# Patient Record
Sex: Female | Born: 1935
Health system: Southern US, Community
[De-identification: ages and names within clinical notes are randomized; demographics above are authoritative.]

## PROBLEM LIST (undated history)

## (undated) DIAGNOSIS — Z7409 Other reduced mobility: Secondary | ICD-10-CM

## (undated) DIAGNOSIS — R911 Solitary pulmonary nodule: Secondary | ICD-10-CM

## (undated) DIAGNOSIS — Z9289 Personal history of other medical treatment: Secondary | ICD-10-CM

## (undated) DIAGNOSIS — K219 Gastro-esophageal reflux disease without esophagitis: Secondary | ICD-10-CM

## (undated) DIAGNOSIS — E785 Hyperlipidemia, unspecified: Secondary | ICD-10-CM

## (undated) DIAGNOSIS — I499 Cardiac arrhythmia, unspecified: Secondary | ICD-10-CM

## (undated) DIAGNOSIS — G4700 Insomnia, unspecified: Secondary | ICD-10-CM

## (undated) DIAGNOSIS — I10 Essential (primary) hypertension: Secondary | ICD-10-CM

## (undated) DIAGNOSIS — I4891 Unspecified atrial fibrillation: Secondary | ICD-10-CM

## (undated) DIAGNOSIS — T7840XA Allergy, unspecified, initial encounter: Secondary | ICD-10-CM

## (undated) DIAGNOSIS — M810 Age-related osteoporosis without current pathological fracture: Secondary | ICD-10-CM

## (undated) DIAGNOSIS — J181 Lobar pneumonia, unspecified organism: Secondary | ICD-10-CM

## (undated) HISTORY — DX: Other reduced mobility: Z74.09

## (undated) HISTORY — DX: Unspecified atrial fibrillation: I48.91

## (undated) HISTORY — DX: Personal history of other medical treatment: Z92.89

## (undated) HISTORY — DX: Essential (primary) hypertension: I10

## (undated) HISTORY — DX: Age-related osteoporosis without current pathological fracture: M81.0

## (undated) HISTORY — DX: Cardiac arrhythmia, unspecified: I49.9

## (undated) HISTORY — DX: Insomnia, unspecified: G47.00

## (undated) HISTORY — DX: Gastro-esophageal reflux disease without esophagitis: K21.9

## (undated) HISTORY — DX: Solitary pulmonary nodule: R91.1

## (undated) HISTORY — DX: Allergy, unspecified, initial encounter: T78.40XA

## (undated) HISTORY — DX: Hyperlipidemia, unspecified: E78.5

## (undated) HISTORY — PX: CT SCAN: SHX5351

## (undated) HISTORY — DX: Lobar pneumonia, unspecified organism: J18.1

---
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
## 1976-06-30 HISTORY — PX: TUBAL LIGATION: SHX77

## 2012-06-02 ENCOUNTER — Ambulatory Visit (INDEPENDENT_AMBULATORY_CARE_PROVIDER_SITE_OTHER): Payer: BC Managed Care – PPO | Admitting: Family Medicine

## 2012-06-02 ENCOUNTER — Telehealth: Payer: Self-pay | Admitting: Family Medicine

## 2012-06-02 VITALS — BP 160/85 | HR 87 | Temp 98.0°F | Resp 18 | Ht 63.0 in | Wt 140.0 lb

## 2012-06-02 DIAGNOSIS — I4891 Unspecified atrial fibrillation: Secondary | ICD-10-CM

## 2012-06-02 LAB — PROTIME-INR
INR: 2.42 — ABNORMAL HIGH (ref <1.50)
Prothrombin Time: 25.4 seconds — ABNORMAL HIGH (ref 11.6–15.2)

## 2012-06-02 NOTE — Progress Notes (Signed)
  [facility]:  Office Visit  Chief Complaint:  Chief Complaint  Patient presents with  . labwork    PT INR check    HPI: Tammy Walker is a 78 y.o. female who complains of  Here for labwork INR due to atrial fibrillation , cardioverted, dx in 5 years. Recent INR was 2.8. No bleeding. She is visiting daughter from [place].  Past Medical History  Diagnosis Date  . Allergy   . Atrial fibrillation   . GERD (gastroesophageal reflux disease)    Past Surgical History  Procedure Date  . Appendectomy   . Tubal ligation    History   Social History  . Marital Status: Married    Spouse Name: N/A    Number of Children: N/A  . Years of Education: N/A   Social History Main Topics  . Smoking status: Never Smoker   . Smokeless tobacco: None  . Alcohol Use: No  . Drug Use: No  . Sexually Active: No   Other Topics Concern  . None   Social History Narrative  . None   Family History  Problem Relation Age of Onset  . Heart disease Mother   . Heart disease Father   . Heart disease Brother   . Hyperlipidemia Daughter   . Hypertension Daughter    No Known Allergies Prior to Admission medications   Medication Sig Start Date End Date Taking? Authorizing Provider  amiodarone (PACERONE) 200 MG tablet Take 200 mg by mouth daily.   Yes Historical Provider, MD  atorvastatin (LIPITOR) 10 MG tablet Take 10 mg by mouth daily.   Yes Historical Provider, MD  omeprazole (PRILOSEC) 20 MG capsule Take 20 mg by mouth daily.   Yes Historical Provider, MD  warfarin (COUMADIN) 3 MG tablet Take 3.5 mg by mouth daily.   Yes Historical Provider, MD     ROS: The patient denies fevers, chills, night sweats, unintentional weight loss, chest pain, palpitations, wheezing, dyspnea on exertion, nausea, vomiting, abdominal pain, dysuria, hematuria, melena, numbness, weakness, or tingling.   All other systems have been reviewed and were otherwise negative with the exception of those  mentioned in the HPI and as above.    PHYSICAL EXAM: Filed Vitals:   06/02/12 0909  BP: 160/85  Pulse: 87  Temp: 98 F (36.7 C)  Resp: 18   Filed Vitals:   06/02/12 0909  Height: 5\' 3"  (1.6 m)  Weight: 140 lb (63.504 kg)   Body mass index is 24.80 kg/(m^2).  General: Alert, no acute distress HEENT:  Normocephalic, atraumatic, oropharynx patent.  Cardiovascular:  Regular rate and rhythm, no rubs, slight systolic murmur at RUSB,  no gallops.  No Carotid bruits, radial pulse intact. No pedal edema.  Respiratory: Clear to auscultation bilaterally.  No wheezes, rales, or rhonchi.  No cyanosis, no use of accessory musculature GI: No organomegaly, abdomen is soft and non-tender, positive bowel sounds.  No masses. Skin: No rashes. Neurologic: Facial musculature symmetric. Psychiatric: Patient is appropriate throughout our interaction. Lymphatic: No cervical lymphadenopathy Musculoskeletal: Gait intact.   LABS: No results found for this or any previous visit.   EKG/XRAY:   Primary read interpreted by Dr. Conley Rolls at [facility].   ASSESSMENT/PLAN: Encounter Diagnosis  Name Primary?  . Atrial fibrillation, controlled Yes    Check PT/INR.  F/u prn Monitor BP, if greater than 140/90 then consider low dose meds    Osamu Olguin PHUONG, DO 06/02/2012 10:09 AM

## 2012-06-02 NOTE — Telephone Encounter (Signed)
LM on her daughter's machine that her INR was 2.42. She is in acceptable range for me for  A. Fib ( 2-3), whish is the range for nonvalvular Atrial fibrillation. Her doctors in [place] want her to be 2.5-3 but I think 2.42. is fine. If she is worried about it she can get it rechecked in 1 week to see if it is within range of 2.5-3 as specified by her doctors in [place].

## 2012-06-25 ENCOUNTER — Ambulatory Visit (INDEPENDENT_AMBULATORY_CARE_PROVIDER_SITE_OTHER): Payer: BC Managed Care – PPO | Admitting: Family Medicine

## 2012-06-25 VITALS — BP 151/82 | HR 86 | Temp 98.1°F | Resp 16 | Ht 63.0 in | Wt 140.4 lb

## 2012-06-25 DIAGNOSIS — IMO0001 Reserved for inherently not codable concepts without codable children: Secondary | ICD-10-CM

## 2012-06-25 DIAGNOSIS — I4891 Unspecified atrial fibrillation: Secondary | ICD-10-CM

## 2012-06-25 DIAGNOSIS — Z7901 Long term (current) use of anticoagulants: Secondary | ICD-10-CM | POA: Insufficient documentation

## 2012-06-25 DIAGNOSIS — I4819 Other persistent atrial fibrillation: Secondary | ICD-10-CM | POA: Insufficient documentation

## 2012-06-25 DIAGNOSIS — Z79899 Other long term (current) drug therapy: Secondary | ICD-10-CM | POA: Insufficient documentation

## 2012-06-25 LAB — PROTIME-INR
INR: 2.24 — ABNORMAL HIGH (ref <1.50)
Prothrombin Time: 24 seconds — ABNORMAL HIGH (ref 11.6–15.2)

## 2012-06-25 MED ORDER — AMLODIPINE BESYLATE 2.5 MG PO TABS: 2.5000 mg | ORAL_TABLET | Freq: Every day | ORAL | Status: DC

## 2012-06-25 NOTE — Patient Instructions (Addendum)
Start one amlodipine pill a day (2.5mg ).  We may end up going to 5 mg a day depending on how your BP looks.  I will also be in touch with you regarding your INR result when it comes in.

## 2012-06-25 NOTE — Progress Notes (Signed)
[facility] [address], [place] [address] [phone]  Date:  06/25/2012   Name:  Tammy Walker   DOB:  02-May-1931   MRN:  [id number]  PCP:  No primary provider on file.    Chief Complaint: Hypertension   History of Present Illness:  Tammy Walker is a 78 y.o. very pleasant female patient who presents with the following:  She was last here on 06/02/12 for an INR- she has a history of atrial fibrillation and is on coumadin.  Visiting [place] from [place]- her daughter lives in [place].    Her BP was 160/ 85 at that visit.  Her INR was 2.42.  They have been checking her BP at home- her BP has been running 177- 200/ 89- 100. She had been on amlodipine for about 4 years but it was stopped in February when she was ill and her BP got too low. She had been increased from 5 to 10 mg shortly before her BP got too low and she passed out.    She is taking 3.5 mg of coumadin each day.  No unusual bleeding or bruising.    She is not having any symptoms of HTN- no HA or CP- feels well overall.  However, she is quite concerned about her BP being so high and would like to start back on some medication  Patient Active Problem List  Diagnosis  . Atrial fibrillation  . Chronic anticoagulation    Past Medical History  Diagnosis Date  . Allergy   . Atrial fibrillation   . GERD (gastroesophageal reflux disease)     Past Surgical History  Procedure Date  . Appendectomy   . Tubal ligation     History  Substance Use Topics  . Smoking status: Never Smoker   . Smokeless tobacco: Not on file  . Alcohol Use: No    Family History  Problem Relation Age of Onset  . Heart disease Mother   . Heart disease Father   . Heart disease Brother   . Hyperlipidemia Daughter   . Hypertension Daughter     No Known Allergies  Medication list has been reviewed and updated.  Current Outpatient Prescriptions on File Prior to Visit  Medication Sig Dispense Refill  .  amiodarone (PACERONE) 200 MG tablet Take 200 mg by mouth daily.      [name] atorvastatin (LIPITOR) 10 MG tablet Take 10 mg by mouth daily.      [name] omeprazole (PRILOSEC) 20 MG capsule Take 20 mg by mouth daily.      [name] warfarin (COUMADIN) 3 MG tablet Take 3.5 mg by mouth daily.        Review of Systems:  As per HPI- otherwise negative.   Physical Examination: Filed Vitals:   06/25/12 0852  BP: 151/82  Pulse: 86  Temp: 98.1 F (36.7 C)  Resp: 16   Filed Vitals:   06/25/12 0852  Height: 5\' 3"  (1.6 m)  Weight: 140 lb 6.4 oz (63.685 kg)   Body mass index is 24.87 kg/(m^2). Ideal Body Weight: Weight in (lb) to have BMI = 25: 140.8   GEN: WDWN, NAD, Non-toxic, A & O x 3 HEENT: Atraumatic, Normocephalic. Neck supple. No masses, No LAD. Ears and Nose: No external deformity. CV: RRR, No M/G/R. No JVD. No thrill. No extra heart sounds. PULM: CTA B, no wheezes, crackles, rhonchi. No retractions. No resp. distress. No accessory muscle use. EXTR: No c/c/e NEURO Normal gait.  PSYCH: Normally interactive. Conversant. Not depressed or anxious appearing.  Calm demeanor.    Assessment and Plan: 1. Atrial fibrillation    2. Chronic anticoagulation  Protime-INR  3. Elevated BP  amLODipine (NORVASC) 2.5 MG tablet   HTN, stopped treatment when she was ill and became hypotensive several months ago.  However, her BP is once again elevated.  Will start on a low dose of norvasc- 2.5 mg.  May increase to 5 mg if her BP is still high.  Check PT/ INR today and follow-up.  They will call me with a BP update next week   Results for orders placed in visit on 06/25/12  PROTIME-INR      Component Value Range   Prothrombin Time 24.0 (*) 11.6 - 15.2 seconds   INR 2.24 (*) <1.50     Tynell Winchell, MD

## 2012-06-30 ENCOUNTER — Telehealth: Payer: Self-pay

## 2012-06-30 NOTE — Telephone Encounter (Signed)
Pt claims her bp is elevated would like to know if she should double her medication please call [phone]

## 2012-06-30 NOTE — Telephone Encounter (Signed)
HTN, stopped treatment when she was ill and became hypotensive several months ago. However, her BP is once again elevated. Will start on a low dose of norvasc- 2.5 mg. May increase to 5 mg if her BP is still high. Check PT/ INR today and follow-up. They will call me with a BP update next week   200/100 148/86  196/101  Patient advised to go ahead and increase this to Norvasc 5mg . She will do this. FYI

## 2012-07-05 NOTE — Telephone Encounter (Signed)
Patient has doubled Rx but is out at this point and insurance and pharmacy are requesting new rx to be written to include her taking double the rx. [facility] on [address] (daughter) [phone]

## 2012-07-08 ENCOUNTER — Ambulatory Visit (INDEPENDENT_AMBULATORY_CARE_PROVIDER_SITE_OTHER): Payer: BC Managed Care – PPO | Admitting: Family Medicine

## 2012-07-08 ENCOUNTER — Ambulatory Visit: Payer: BC Managed Care – PPO

## 2012-07-08 VITALS — BP 153/81 | HR 69 | Temp 97.8°F | Resp 16 | Ht 64.0 in | Wt 137.0 lb

## 2012-07-08 DIAGNOSIS — M549 Dorsalgia, unspecified: Secondary | ICD-10-CM

## 2012-07-08 DIAGNOSIS — T148XXA Other injury of unspecified body region, initial encounter: Secondary | ICD-10-CM

## 2012-07-08 DIAGNOSIS — I10 Essential (primary) hypertension: Secondary | ICD-10-CM

## 2012-07-08 DIAGNOSIS — IMO0002 Reserved for concepts with insufficient information to code with codable children: Secondary | ICD-10-CM

## 2012-07-08 MED ORDER — AMLODIPINE BESYLATE 5 MG PO TABS: 5.0000 mg | ORAL_TABLET | Freq: Every day | ORAL | Status: DC

## 2012-07-08 MED ORDER — LIDOCAINE 5 % EX PTCH: 1.0000 | MEDICATED_PATCH | CUTANEOUS | Status: DC

## 2012-07-08 MED ORDER — METHOCARBAMOL 500 MG PO TABS: 500.0000 mg | ORAL_TABLET | Freq: Three times a day (TID) | ORAL | Status: DC

## 2012-07-08 MED ORDER — HYDROCODONE-ACETAMINOPHEN 5-500 MG PO TABS: 1.0000 | ORAL_TABLET | Freq: Four times a day (QID) | ORAL | Status: DC | PRN

## 2012-07-08 NOTE — Progress Notes (Signed)
Subjective: Patient injured her back little less than a week ago. She been doing some vacuuming and various work around the house. Did not have a specific injury, but the back started hurting more and more. She went to the airport a week ago to fly out of [place]  and was so miserable she ended up having to go to the emergency room in [place]. They kept her all night and did a number of tests on her. She returned to [facility] and has continued to have extreme pain in her back. She's been going to a chiropractor loss last 3 or 4 days who has helped some with various modalities, but she still has tremendous pain. She's been taking ibuprofen and acetaminophen alternating.  She is from [place], and is returning home hopefully next week. She has a doctor's appointment there. Her blood pressures been high, and she needs a refill of her Norvasc 5 mg. She is trying to type herself through until she can get home. Her daughter is going to fly with her.  Objective: Tender in the mid to low thoracic area, tender along the spine but also in the paraspinous muscles over to below the scapula. She is able to ambulate but it hurts tremendously to laydown.  [facility] reading (PRIMARY) by  Dr. Alwyn Ren X-rays revealed significant osteoporosis. She has a lot of dorsal kyphosis. There is some apparent Nehring of the anterior aspects of the vertebra in the neighborhood of about T7 and T8. I cannot count them exactly accurately.  X-rays from the chiropractor  were reviewed, and I did not feel like I couldn't get a clear impression although she has some arthritic deterioration..  Assessment: Severe back pain Hypertension  Plan: See discharge instructions Lidoderm and hydrocodone Refill blood pressure medications

## 2012-07-08 NOTE — Patient Instructions (Signed)
Use the patches, pain medication, and muscle relaxants as directed.  Continue current blood pressure medication  Return if needed

## 2012-07-11 ENCOUNTER — Other Ambulatory Visit: Payer: Self-pay | Admitting: Family Medicine

## 2012-07-11 ENCOUNTER — Telehealth: Payer: Self-pay

## 2012-07-11 DIAGNOSIS — M549 Dorsalgia, unspecified: Secondary | ICD-10-CM

## 2012-07-11 NOTE — Telephone Encounter (Signed)
PTS DAUGHTER WOULD LIKE CLARIFICATION ON RECENT VISIT NOTES VS. DIAGNOSIS CODE ON AVS. ALSO WAITING ON REFILL ON LIDACAINE RX. STATES AMOUNT OK TO USE (3X DAILY PER DR HOPPER) MADE THEM RUN THROUGH RX FASTER (HAVE 6 LEFT) WOULD LIKE REFILLED BEFORE THEY GO TO [place].  TAKING BACK TO [place] ON Thursday. WOULD LIKE CALL BEFORE THEN PLEASE.   BF

## 2012-07-12 NOTE — Telephone Encounter (Signed)
Patients daughter advised. She states the package states on Lidocaine patch to use up to 3 patches, so she was using 3. I have advised her I will ask about renewing the lidoderm patch, but she should only use one patch at a time currently. Please advise on renewal.

## 2012-07-13 MED ORDER — LIDOCAINE 5 % EX PTCH: MEDICATED_PATCH | CUTANEOUS | Status: DC

## 2012-07-13 MED ORDER — LIDOCAINE 5 % EX PTCH: 1.0000 | MEDICATED_PATCH | CUTANEOUS | Status: DC

## 2012-07-13 NOTE — Telephone Encounter (Signed)
One patch every 12 hours.  I'm not sure of office notes vs. Diagnosis codes-sometimes we have to choose a diagnosis code that is similar but not exact due to limited codes being available.  The diagnosis code vs note diagnosis wouldn't change the office visit charge.

## 2012-07-13 NOTE — Telephone Encounter (Signed)
I spoke w/ Marylene Land to clarify how pt should use the Lidoderm patches and she stated pt can use up to 3 patches at a time for 12 hours and then take off for 12 hrs for a total max of 3 patches in a 24 hr period. Also OKd increase of Rx to #30.  Sent in new Rx and LMOM for daughter to CB.

## 2012-07-13 NOTE — Telephone Encounter (Signed)
Patient's daughter advised.  

## 2012-07-30 ENCOUNTER — Encounter: Payer: Self-pay | Admitting: Family Medicine

## 2014-07-26 ENCOUNTER — Ambulatory Visit (INDEPENDENT_AMBULATORY_CARE_PROVIDER_SITE_OTHER): Payer: BLUE CROSS/BLUE SHIELD | Admitting: Family Medicine

## 2014-07-26 VITALS — BP 135/80 | HR 82 | Temp 98.5°F | Resp 18 | Ht 62.0 in | Wt 134.8 lb

## 2014-07-26 DIAGNOSIS — Z8679 Personal history of other diseases of the circulatory system: Secondary | ICD-10-CM

## 2014-07-26 DIAGNOSIS — I4891 Unspecified atrial fibrillation: Secondary | ICD-10-CM

## 2014-07-26 DIAGNOSIS — Z7901 Long term (current) use of anticoagulants: Secondary | ICD-10-CM

## 2014-07-26 NOTE — Progress Notes (Signed)
Chief Complaint:  Chief Complaint  Patient presents with  . need to check INR    HPI: Tammy Walker is a 81 y.o. female who is here for  INR check on warfarin for a fib in sinus rhythm.  NO Ses, Last iNR in 06/07/14 was 2.1, goal is 2-3 She is visiting her [place] from [place].  [place]  Past Medical History  Diagnosis Date  . Allergy   . Atrial fibrillation   . GERD (gastroesophageal reflux disease)   . Osteoporosis     on fosamax  . Insomnia    Past Surgical History  Procedure Laterality Date  . Appendectomy    . Tubal ligation     History   Social History  . Marital Status: Married    Spouse Name: N/A    Number of Children: N/A  . Years of Education: N/A   Social History Main Topics  . Smoking status: Never Smoker   . Smokeless tobacco: None  . Alcohol Use: No  . Drug Use: No  . Sexual Activity: No   Other Topics Concern  . None   Social History Narrative   Family History  Problem Relation Age of Onset  . Heart disease Mother   . Heart disease Father   . Heart disease Brother   . Hyperlipidemia Daughter   . Hypertension Daughter    No Known Allergies Prior to Admission medications   Medication Sig Start Date End Date Taking? Authorizing Provider  amiodarone (PACERONE) 200 MG tablet Take 200 mg by mouth daily.   Yes Historical Provider, MD  amitriptyline (ELAVIL) 10 MG tablet Take 20 mg by mouth at bedtime.   Yes Historical Provider, MD  amLODipine (NORVASC) 5 MG tablet Take 1 tablet (5 mg total) by mouth daily. 07/08/12  Yes Peyton Najjaravid H Hopper, MD  atorvastatin (LIPITOR) 10 MG tablet Take 10 mg by mouth daily.   Yes Historical Provider, MD  calcium carbonate 1250 MG capsule Take 1,250 mg by mouth 2 (two) times daily with a meal.   Yes Historical Provider, MD  omeprazole (PRILOSEC) 20 MG capsule Take 20 mg by mouth daily.   Yes Historical Provider, MD  warfarin (COUMADIN) 3 MG tablet Take 3.5 mg by mouth daily.   Yes  Historical Provider, MD     ROS: The patient denies fevers, chills, night sweats, unintentional weight loss, chest pain, palpitations, wheezing, dyspnea on exertion, nausea, vomiting, abdominal pain, dysuria, hematuria, melena, numbness, weakness, or tingling.   All other systems have been reviewed and were otherwise negative with the exception of those mentioned in the HPI and as above.    PHYSICAL EXAM: Filed Vitals:   07/26/14 1556  BP: 135/80  Pulse: 82  Temp: 98.5 F (36.9 C)  Resp: 18   Filed Vitals:   07/26/14 1556  Height: 5\' 2"  (1.575 m)  Weight: 134 lb 12.8 oz (61.145 kg)   Body mass index is 24.65 kg/(m^2).  General: Alert, no acute distress HEENT:  Normocephalic, atraumatic, oropharynx patent. EOMI, PERRLA Cardiovascular:  Regular rate and rhythm, no rubs murmurs or gallops.  No pedal edema.  Respiratory: Clear to auscultation bilaterally.  No wheezes, rales, or rhonchi.  No cyanosis, no use of accessory musculature GI: No organomegaly, abdomen is soft and non-tender, positive bowel sounds.  No masses. Skin: No rashes. Neurologic: Facial musculature symmetric. Psychiatric: Patient is appropriate throughout our interaction. Lymphatic: No cervical lymphadenopathy Musculoskeletal: Gait intact.  LABS: Results for orders placed or performed in visit on 06/25/12  Protime-INR  Result Value Ref Range   Prothrombin Time 24.0 (H) 11.6 - 15.2 seconds   INR 2.24 (H) <1.50     EKG/XRAY:   Primary read interpreted by Dr. Conley Rolls at [facility].   ASSESSMENT/PLAN: Encounter Diagnoses  Name Primary?  . Chronic anticoagulation Yes  . Atrial fibrillation, currently in sinus rhythm    PT/INR pending F/u prn   Gross sideeffects, risk and benefits, and alternatives of medications d/w patient. Patient is aware that all medications have potential sideeffects and we are unable to predict every sideeffect or drug-drug interaction that may occur.  Hamilton Capri PHUONG,  DO 07/26/2014 5:28 PM

## 2014-07-27 LAB — PROTIME-INR
INR: 2.07 — ABNORMAL HIGH (ref <1.50)
Prothrombin Time: 23.3 seconds — ABNORMAL HIGH (ref 11.6–15.2)

## 2015-07-26 ENCOUNTER — Ambulatory Visit (INDEPENDENT_AMBULATORY_CARE_PROVIDER_SITE_OTHER): Payer: BLUE CROSS/BLUE SHIELD | Admitting: Urgent Care

## 2015-07-26 VITALS — BP 134/83 | HR 76 | Temp 97.6°F | Resp 16 | Ht 63.0 in | Wt 134.8 lb

## 2015-07-26 DIAGNOSIS — Z8679 Personal history of other diseases of the circulatory system: Secondary | ICD-10-CM

## 2015-07-26 DIAGNOSIS — I4891 Unspecified atrial fibrillation: Secondary | ICD-10-CM

## 2015-07-26 DIAGNOSIS — R030 Elevated blood-pressure reading, without diagnosis of hypertension: Secondary | ICD-10-CM

## 2015-07-26 DIAGNOSIS — Z7901 Long term (current) use of anticoagulants: Secondary | ICD-10-CM | POA: Diagnosis not present

## 2015-07-26 DIAGNOSIS — I10 Essential (primary) hypertension: Secondary | ICD-10-CM

## 2015-07-26 NOTE — Patient Instructions (Signed)
Warfarin Coagulopathy °Warfarin (Coumadin®) coagulopathy refers to bleeding that may occur as a complication of the medicine warfarin. Warfarin is an oral blood thinner (anticoagulant). Warfarin is used for medical conditions where thinning of the blood is needed to prevent blood clots.  °CAUSES °Bleeding is the most common and most serious complication of warfarin. The amount of bleeding is related to the warfarin dose and length of treatment. In addition, bleeding complications can also occur due to: °· Intentional or accidental warfarin overdose. °· Underlying medical conditions. °· Dietary changes. °· Medicine, herbal, supplement, or alcohol interactions. °SYMPTOMS °Severe bleeding while on warfarin may occur from any tissue or organ. Symptoms of the blood being too thin may include: °· Bleeding from the nose or gums. °· Blood in bowel movements which may appear as bright red, dark, or black tarry stools. °· Blood in the urine which may appear as pink, red, or brown urine. °· Unusual bruising or bruising easily. °· A cut that does not stop bleeding within 10 minutes. °· Vomiting blood or continuous nausea for more than 1 day. °· Coughing up blood. °· Broken blood vessels in your eye (subconjunctival hemorrhage). °· Abdominal or back pain with or without flank bruising. °· Sudden, severe headache. °· Sudden weakness or numbness of the face, arm, or leg, especially on one side of the body. °· Sudden confusion. °· Trouble speaking (aphasia) or understanding. °· Sudden trouble seeing in one or both eyes. °· Sudden trouble walking. °· Dizziness. °· Loss of balance or coordination. °· Vaginal bleeding. °· Swelling or pain at an injection site. °· Superficial fat tissue death (necrosis) which may cause skin scarring. This is more common in women and may first present as pain in the waist, thighs, or buttocks. °HOME CARE INSTRUCTIONS °· Always contact your health care provider of any concerns or signs of possible  warfarin coagulopathy as soon as possible. °· Take warfarin exactly as directed by your health care provider. It is recommended that you take your warfarin dose at the same time of the day. If you have been told to stop taking warfarin, do not resume taking warfarin until directed to do so by your health care provider. Follow your health care provider's instructions if you accidentally take an extra dose or miss a dose of warfarin. It is very important to take warfarin as directed since bleeding or blood clots could result in chronic or permanent injury, pain, or disability. °· Keep all follow-up appointments with your health care provider as directed. It is very important to keep your appointments. Not keeping appointments could result in a chronic or permanent injury, pain, or disability because warfarin is a medicine that requires close monitoring. °· While taking warfarin, you will need to have regular blood tests to measure your blood clotting time. These blood tests usually include both the prothrombin time (PT) and International Normalized Ratio (INR) tests. The PT and INR results allow your health care provider to adjust your dose of warfarin. The dose can change for many reasons. It is critically important that you have your PT and INR levels drawn exactly as directed. Your warfarin dose may stay the same or change depending on what the PT and INR results are. Be sure to follow up with your health care provider regarding your PT and INR test results and what your warfarin dosage should be. °· Many medicines can interfere with warfarin and affect the PT and INR results. You must tell your health care provider about any and   all medicines you take. This includes all vitamins and supplements. Ask your health care provider before taking these. Prescription and over-the-counter medicine consistency is critical to warfarin management. It is important that potential interactions are checked before you start a new  medicine. Be especially cautious with aspirin and anti-inflammatory medicines. Ask your health care provider before taking these. Medicines such as antibiotics and acid-reducing medicine can interact with warfarin and can cause an increased warfarin effect. Warfarin can also interfere with the effectiveness of medicines you are taking. Do not take or discontinue any prescribed or over-the-counter medicine except on the advice of your health care provider or pharmacist. °· Some vitamins, supplements, and herbal products interfere with the effectiveness of warfarin. Vitamin E may increase the anticoagulant effects of warfarin. Vitamin K can cause warfarin to be less effective. Do not take or discontinue any vitamin, supplement, or herbal product except on the advice of your health care provider or pharmacist. °· Eat what you normally eat and keep the vitamin K content of your diet consistent. Avoid major changes in your diet, or notify your health care provider before changing your diet. Suddenly getting a lot more vitamin K could cause your blood to clot too quickly. A sudden decrease in vitamin K intake could cause your blood to clot too slowly. These changes in vitamin K intake could lead to dangerous blood clots or to bleeding. To keep your vitamin K intake consistent, you must be aware of which foods contain moderate or high amounts of vitamin K. Some foods that are high in vitamin K include spinach, kale, broccoli, cabbage, greens, Brussels sprouts, asparagus, bok choy, coleslaw, and parsley. If you drink green tea, drink the same amount each day. Arrange a visit with a dietitian to answer your questions. °· If you have a loss of appetite or get the stomach flu (viral gastroenteritis), talk to your health care provider as soon as possible. A decrease in your normal vitamin K intake can make you more sensitive to your usual dose of warfarin. °· Some medical conditions may increase your risk for bleeding while you  are taking warfarin. A fever, diarrhea lasting more than a day, worsening heart failure, or worsening liver function are some medical conditions that could affect warfarin. Contact your health care provider if you have any of these medical conditions. °· Be careful not to cut yourself when using sharp objects or while shaving. °· Alcohol can change the body's ability to handle warfarin. It is best to avoid alcoholic drinks or consume only very small amounts while taking warfarin. Notify your health care provider if you change your alcohol intake. A sudden increase in alcohol use can increase your risk of bleeding. Chronic alcohol use can cause warfarin to be less effective. °· Limit physical activities or sports that could result in a fall or cause injury. °· Do not use warfarin if you are pregnant. °· Inform all your health care providers and your dentist that you take warfarin. °· Inform all health care providers if you are taking warfarin and aspirin or platelet inhibitor medicines such as clopidogrel, ticagrelor, or prasugrel. Use of these medicines in addition to warfarin can increase your risk of bleeding or death. Taking these medicines together should only be done under the direct care of your health care providers. °SEEK IMMEDIATE MEDICAL CARE IF: °· You cough up blood. °· You have dark or black stools or there is bright red blood coming from your rectum. °· You vomit blood or have   nausea for more than 1 day. °· You have blood in the urine or pink-colored urine. °· You have unusual bruising or have increased bruising. °· You have bleeding from the nose or gums that does not stop quickly. °· You have a cut that does not stop bleeding within 2-3 minutes. °· You have sudden weakness or numbness of the face, arm, or leg, especially on one side of the body. °· You have sudden confusion. °· You have trouble speaking (aphasia) or understanding. °· You have sudden trouble seeing in one or both eyes. °· You have  sudden trouble walking. °· You have dizziness. °· You have a loss of balance or coordination. °· You have a sudden, severe headache. °· You have a serious fall or head injury, even if you are not bleeding. °· You have swelling or pain at an injection site. °· You have unexplained tenderness or pain in the abdomen, back, waist, thighs, or buttocks. °Any of these symptoms may represent a serious problem that is an emergency. Do not wait to see if the symptoms will go away. Get medical help right away. Call your local emergency services (911 in [place] Do not drive yourself to the hospital. °  °This information is not intended to replace advice given to you by your health care provider. Make sure you discuss any questions you have with your health care provider. °  °Document Released: 05/25/2006 Document Revised: 10/31/2014 Document Reviewed: 11/25/2011 °Elsevier Interactive Patient Education ©2016 [employer] ° °

## 2015-07-26 NOTE — Progress Notes (Signed)
    MRN: [id number] DOB: [birth year]  Subjective:   Tammy Walker is a 82 y.o. female presenting for chief complaint of Other  Atrial fibrillation - managed with amiodarone, warfarin. Patient is originally from [place]. She [place], has monthly INRs when she is here. Patient is stable with her dose, usually between 3-3.5mg  with INRs in therapeutic range. She currently denies ROS as below.  HTN - managed with amlodipine. Denies lightheadedness, dizziness, chronic headache, double vision, chest pain, shortness of breath, heart racing, palpitations, nausea, vomiting, abdominal pain, hematuria, lower leg swelling.   Faatimah has a current medication list which includes the following prescription(s): amiodarone, amitriptyline, amlodipine, atorvastatin, calcium carbonate, omeprazole, and warfarin. Also has No Known Allergies.  Lateria  has a past medical history of Allergy; Atrial fibrillation (HCC); GERD (gastroesophageal reflux disease); Osteoporosis; and Insomnia. Also  has past surgical history that includes Appendectomy and Tubal ligation.  Objective:   Vitals: BP 134/83 mmHg  Pulse 76  Temp(Src) 97.6 F (36.4 C) (Oral)  Resp 16  Ht  (1.6 m)  Wt 134 lb 12.8 oz (61.145 kg)  BMI 23.88 kg/m2  SpO2 98%  Physical Exam  Constitutional: She is oriented to person, place, and time. She appears well-developed and well-nourished.  HENT:  Mouth/Throat: Oropharynx is clear and moist.  Eyes: Pupils are equal, round, and reactive to light. No scleral icterus.  Cardiovascular: Normal rate, regular rhythm and intact distal pulses.  Exam reveals no gallop and no friction rub.   No murmur heard. Pulmonary/Chest: No respiratory distress. She has no wheezes. She has no rales.  Musculoskeletal: She exhibits no edema.  Neurological: She is alert and oriented to person, place, and time.  Skin: Skin is warm and dry. No rash noted. No erythema. No pallor.   Assessment  and Plan :   1. Atrial fibrillation, currently in sinus rhythm (HCC) 2. On warfarin therapy 3. Chronic anticoagulation - PT-INR pending. Continue current dosage. Will call results to her cell phone, VM okay.  4. Essential hypertension 5. Elevated blood pressure reading - Patient initially had BP reading of 164/81. It was 134/83 on recheck, she is asymptomatic. Continue amlodipine and counseled on need for office visit if her BP remains above 150/90. She verbalized understanding.  Wallis Bamberg, PA-C [facility] [phone] 07/26/2015 11:33 AM

## 2015-07-27 LAB — PROTIME-INR
INR: 1.92 — ABNORMAL HIGH (ref <1.50)
Prothrombin Time: 22.2 seconds — ABNORMAL HIGH (ref 11.6–15.2)

## 2015-07-28 ENCOUNTER — Telehealth: Payer: Self-pay

## 2015-07-28 DIAGNOSIS — I4891 Unspecified atrial fibrillation: Secondary | ICD-10-CM

## 2015-07-28 NOTE — Telephone Encounter (Signed)
Pt says her results from yesterday show a TP.  She would like to know what that means.  Call [phone]

## 2015-07-29 NOTE — Telephone Encounter (Signed)
Discussed results with patient. She had her coumadin increased by 0.5mg  by her PCP. States that she needs to have it rechecked in 1 week. Future lab order placed.

## 2015-07-29 NOTE — Telephone Encounter (Signed)
Pt notified of results again by Gurney Maxin

## 2015-08-02 ENCOUNTER — Other Ambulatory Visit (INDEPENDENT_AMBULATORY_CARE_PROVIDER_SITE_OTHER): Payer: BLUE CROSS/BLUE SHIELD | Admitting: *Deleted

## 2015-08-02 DIAGNOSIS — I4891 Unspecified atrial fibrillation: Secondary | ICD-10-CM

## 2015-08-02 DIAGNOSIS — Z7901 Long term (current) use of anticoagulants: Secondary | ICD-10-CM

## 2015-08-02 LAB — PROTIME-INR
INR: 2.5 — ABNORMAL HIGH (ref <1.50)
Prothrombin Time: 27.4 seconds — ABNORMAL HIGH (ref 11.6–15.2)

## 2015-08-27 ENCOUNTER — Other Ambulatory Visit (INDEPENDENT_AMBULATORY_CARE_PROVIDER_SITE_OTHER): Payer: BLUE CROSS/BLUE SHIELD

## 2015-08-27 DIAGNOSIS — I4891 Unspecified atrial fibrillation: Secondary | ICD-10-CM

## 2015-08-27 DIAGNOSIS — Z7901 Long term (current) use of anticoagulants: Secondary | ICD-10-CM

## 2015-08-27 LAB — PROTIME-INR
INR: 2.26 — ABNORMAL HIGH (ref <1.50)
Prothrombin Time: 25.3 seconds — ABNORMAL HIGH (ref 11.6–15.2)

## 2015-08-27 NOTE — Progress Notes (Signed)
Pt. Came in for a lab only visit. 

## 2015-08-29 ENCOUNTER — Telehealth: Payer: Self-pay

## 2015-08-29 NOTE — Telephone Encounter (Signed)
Pt would like to have a call back at [phone] for her lab results

## 2015-08-29 NOTE — Telephone Encounter (Signed)
The patient called back about her lab results.  I read what Deliah Boston asked in the notes. She said she takes Warfarin - 1  pill and 1 0.5mg  pill.  Total of 3.5 mg each day.

## 2015-08-29 NOTE — Telephone Encounter (Signed)
Tammy Walker,  Please see previous message 

## 2015-09-03 NOTE — Telephone Encounter (Signed)
Forwarding to [place].

## 2015-09-05 NOTE — Telephone Encounter (Signed)
In January, I agreed to order a future lab for patient to have PT-INR drawn. Apparently, she came in but the result did not make it to me. I'm not sure why. Either way, I called and reported her level today.

## 2015-09-20 DIAGNOSIS — J189 Pneumonia, unspecified organism: Secondary | ICD-10-CM

## 2015-09-20 HISTORY — DX: Pneumonia, unspecified organism: J18.9

## 2016-09-19 ENCOUNTER — Ambulatory Visit (INDEPENDENT_AMBULATORY_CARE_PROVIDER_SITE_OTHER): Payer: BLUE CROSS/BLUE SHIELD | Admitting: Physician Assistant

## 2016-09-19 VITALS — BP 145/82 | HR 79 | Temp 98.4°F | Resp 17 | Ht 63.0 in | Wt 134.0 lb

## 2016-09-19 DIAGNOSIS — Z5181 Encounter for therapeutic drug level monitoring: Secondary | ICD-10-CM

## 2016-09-19 DIAGNOSIS — I4891 Unspecified atrial fibrillation: Secondary | ICD-10-CM | POA: Diagnosis not present

## 2016-09-19 DIAGNOSIS — R030 Elevated blood-pressure reading, without diagnosis of hypertension: Secondary | ICD-10-CM

## 2016-09-19 DIAGNOSIS — Z7901 Long term (current) use of anticoagulants: Secondary | ICD-10-CM | POA: Diagnosis not present

## 2016-09-19 NOTE — Patient Instructions (Signed)
     IF you received an x-ray today, you will receive an invoice from [facility]. Please contact [facility] at [phone] with questions or concerns regarding your invoice.   IF you received labwork today, you will receive an invoice from [facility]. Please contact [facility] at [phone] with questions or concerns regarding your invoice.   Our billing staff will not be able to assist you with questions regarding bills from these companies.  You will be contacted with the lab results as soon as they are available. The fastest way to get your results is to activate your My Chart account. Instructions are located on the last page of this paperwork. If you have not heard from us regarding the results in 2 weeks, please contact this office.     

## 2016-09-19 NOTE — Progress Notes (Signed)
   Tammy Walker  MRN: [id number] DOB: 1931-02-28  PCP: No PCP Per Patient  Subjective:  Pt is apleasant 83 year old female PMH atrial fibrillation and anticoagulation therapy who presents to clinic for INR check. She is here today with her daughter.   H/o atrial fibrillation - Well controlled with Amiodarone and warfarin. She is from [place] and is here for a few months visiting her daughter. PCP is in [place]. She checks INR q monthly and lets her PCP know the results - he manages her medication doses.  Pt is stable with her dose: 3-3.5mg  with INR in therapeutic range. Goal 2-3. Last month her INR was 2.26. She is feeling well today.   H/o HTN - today's blood pressure is 145/82. Denies headache, chest pain, palpitations, dizziness, vision changes.   Her brother who lives in [place] recently passed suddenly.   Review of Systems  Respiratory: Negative for cough, chest tightness and shortness of breath.   Cardiovascular: Negative for chest pain and palpitations.  Hematological: Does not bruise/bleed easily.    Patient Active Problem List   Diagnosis Date Noted  . Atrial fibrillation (HCC) 06/25/2012  . Chronic anticoagulation 06/25/2012    Current Outpatient Prescriptions on File Prior to Visit  Medication Sig Dispense Refill  . amiodarone (PACERONE) 200 MG tablet Take 200 mg by mouth daily.    [name]. atorvastatin (LIPITOR) 10 MG tablet Take 10 mg by mouth daily.    . calcium carbonate 1250 MG capsule Take 1,250 mg by mouth 2 (two) times daily with a meal.    . warfarin (COUMADIN) 3 MG tablet Take 3.5 mg by mouth daily.     No current facility-administered medications on file prior to visit.     No Known Allergies   Objective:  BP (!) 145/82 (BP Location: Left Arm, Patient Position: Sitting, Cuff Size: Small)   Pulse 79   Temp 98.4 F (36.9 C) (Oral)   Resp 17   Ht 5\' 3"  (1.6 m)   Wt 134 lb (60.8 kg)   SpO2 96%   BMI 23.74 kg/m   Physical Exam  Constitutional: She is  oriented to person, place, and time and well-developed, well-nourished, and in no distress. No distress.  Cardiovascular: Normal rate, normal heart sounds and normal pulses.  An irregularly irregular rhythm present.  Neurological: She is alert and oriented to person, place, and time. GCS score is 15.  Skin: Skin is warm and dry.  Psychiatric: Mood, memory, affect and judgment normal.  Vitals reviewed.   Assessment and Plan :  1. Long-term (current) use of anticoagulants 2. Anticoagulated with warfarin 3. Atrial fibrillation, unspecified type (HCC) 4. Elevated blood pressure reading - Protime-INR - Blood pressure upon check-in was 151/76. Repeat is 145/82. She is asymptomatic and feeling well today. Continue current dose Amlodipine. She is under stress as her brother recently passed away "suddenly". Encouraged her to check BP occasionally. RTC if pressure remains high.  - Pt is stable. Lab is pending - continue current dose. Will contact with results.   Marco CollieWhitney Hillel Card, PA-C  [facility] 09/19/2016 3:51 PM

## 2016-09-20 LAB — PROTIME-INR
INR: 1.8 — ABNORMAL HIGH (ref 0.8–1.2)
Prothrombin Time: 18.3 s — ABNORMAL HIGH (ref 9.1–12.0)

## 2016-09-22 NOTE — Progress Notes (Signed)
INR is blow her goal of 2-3. She is from [place], PCP is in [place] and manages medication from there. She will call and let him know the results.

## 2016-09-22 NOTE — Progress Notes (Signed)
Please call pt and let her know her INR is 1.8. Her PT is 18.3.  Thank you!

## 2017-10-24 DIAGNOSIS — Z9289 Personal history of other medical treatment: Secondary | ICD-10-CM

## 2017-10-24 HISTORY — DX: Personal history of other medical treatment: Z92.89

## 2018-08-04 ENCOUNTER — Encounter: Payer: Self-pay | Admitting: Family

## 2018-09-21 ENCOUNTER — Ambulatory Visit: Payer: BLUE CROSS/BLUE SHIELD | Admitting: Nurse Practitioner

## 2018-09-21 ENCOUNTER — Ambulatory Visit: Payer: BLUE CROSS/BLUE SHIELD | Admitting: Family

## 2018-12-17 ENCOUNTER — Ambulatory Visit: Payer: BLUE CROSS/BLUE SHIELD | Admitting: Family

## 2019-03-16 ENCOUNTER — Ambulatory Visit (INDEPENDENT_AMBULATORY_CARE_PROVIDER_SITE_OTHER): Payer: BC Managed Care – PPO | Admitting: Family

## 2019-03-16 ENCOUNTER — Other Ambulatory Visit: Payer: Self-pay

## 2019-03-16 ENCOUNTER — Encounter: Payer: Self-pay | Admitting: Family

## 2019-03-16 VITALS — BP 140/80 | HR 71 | Temp 98.0°F | Ht 63.0 in | Wt 131.6 lb

## 2019-03-16 DIAGNOSIS — Z23 Encounter for immunization: Secondary | ICD-10-CM | POA: Diagnosis not present

## 2019-03-16 DIAGNOSIS — M810 Age-related osteoporosis without current pathological fracture: Secondary | ICD-10-CM

## 2019-03-16 DIAGNOSIS — M545 Low back pain, unspecified: Secondary | ICD-10-CM

## 2019-03-16 DIAGNOSIS — H539 Unspecified visual disturbance: Secondary | ICD-10-CM

## 2019-03-16 DIAGNOSIS — H6123 Impacted cerumen, bilateral: Secondary | ICD-10-CM | POA: Diagnosis not present

## 2019-03-16 DIAGNOSIS — E782 Mixed hyperlipidemia: Secondary | ICD-10-CM

## 2019-03-16 DIAGNOSIS — G8929 Other chronic pain: Secondary | ICD-10-CM

## 2019-03-16 DIAGNOSIS — J302 Other seasonal allergic rhinitis: Secondary | ICD-10-CM

## 2019-03-16 DIAGNOSIS — I4891 Unspecified atrial fibrillation: Secondary | ICD-10-CM | POA: Diagnosis not present

## 2019-03-16 MED ORDER — CALCIUM CARBONATE-VITAMIN D 500-200 MG-UNIT PO TABS: 1.0000 | ORAL_TABLET | Freq: Two times a day (BID) | ORAL | 3 refills | Status: DC

## 2019-03-16 MED ORDER — ACETAMINOPHEN 500 MG PO TABS: 500.0000 mg | ORAL_TABLET | Freq: Three times a day (TID) | ORAL | 0 refills | Status: AC | PRN

## 2019-03-16 NOTE — Progress Notes (Signed)
Provider: Marlowe Sax FNP-C   Natara Monfort, Nelda Bucks, NP  Patient Care Team: Jerimie Mancuso, Nelda Bucks, NP as PCP - General (Family Medicine)  Extended Emergency Contact Information Primary Emergency Contact: Marline Backbone Address: [address]          [place], [place] [address] Johnnette Litter of [place] Phone: [phone] Work Phone: [phone] Mobile Phone: [phone] Relation: Daughter Secondary Emergency Contact: [place] Mobile Phone: [phone] Relation: Grandson  Code Status: Full Code  Goals of care: Advanced Directive information Advanced Directives 03/16/2019  Does Patient Have a Medical Advance Directive? Yes  Type of Advance Directive [place]  Does patient want to make changes to medical advance directive? No - Patient declined     Chief Complaint  Patient presents with   [place] patient patient would like to keep taking medication as before and discuss, prolia last one was august 2020, discuss prior lab work and needs approval for vitamin d    Quality Metric Gaps    Flu Shot     HPI:  Pt is a 85 y.o. 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     Hypertension - she does not check her blood pressure at home.she states watches the intake of salt,sugar and fat in the diet. on Amlodipine 5 mg tablet daily.also on   Lipitor 10 mg tablet daily.she denies any signs or symptoms of hyper/hypotension or chest pain.  Hyperlipidemia - on Lipitor 10 mg tablet daily.she tries to eat a heart healthy diet.she does senior exercise for strength and balance on tube twice daily for 10 minute daily and also walks 6 blocks daily.   Afib - on Edoxaban 30 mg tablet daily and Amiodarone 200 mg tablet daily.she denies any chest pain or palpitation.Recent EF 55-60%.has mild aortic stenosis with mild regurgitation dilated ascending Aorta.Mild-moderate mitral,Tricuspid and pulmonary regurgitation.   Seasonal Allergies - on cetirizine 10 mg tablet daily.  Osteoporosis - severe kyphosis.on Prolia  60 mg injection every 6 months.last dose given in 02/17/2019 due next February,2021.on calcium supplement but not vit D. Last vit D level 69.4 (08/04/2018). Last Dexa scan  July 5th,2019 by DR.Pode which showed no acute fracture but showed one old vertebrae.the bone density in the hip declined 2.25% per annum compared with results in 2017 expected to increase with Prolia as the bone mineralizes.   Chronic mid back pain - Takes paracetamol ( Tylenol) as needed and Oxycodone 5 mg capsule twice daily as needed  for pain.also on Pregabalin 25 mg tablet twice daily. She rates pain 4/10 in the morning but worst when she stands for prolong period of time like when she is busy cooking pain is 6/10 has to stop.  GERD - no longer having any symptoms.off medication.   Constipation - takes Dulcolax 5 mg tablet twice daily.Tries to drink water.  Hemorroids - stable.uses preparation H as needed and sitz  [place].Reports no rectal bleeding or blood in the stool.  Past Medical History:  Diagnosis Date   Allergy    Arrhythmia    Atrial fibrillation ([place])    GERD (gastroesophageal reflux disease)    History of bone scan    Bone Density Scans 2017 & 2019 Dr. Trenton Gammon    History of chest x-ray    Apart from large hiatus hernia, normal heart, lungs and  mediastinum. Per records from [facility]     History of CT scan of abdomen 10/24/2017   Gallstones within a thin-waled gallbladder, Per records from [facility]    History of echocardiogram    [facility]    History of EKG    Sinus rhythm, borderline 1st deg block, LAD completely changed axis, LBBB(old). Per records from [facility]    Hyperlipidemia    Hypertension    Insomnia    Left lower lobe pneumonia ([place]) 09/20/2015   Per records from [facility]    Nodule of right lung    [facility],    Osteoporosis    on fosamax   Reduced mobility    Past Surgical History:  Procedure Laterality Date   [place], [place]    CT [place]     2017, 2018, 2019 -- re lungs see records    [place], [place], [place]     No Known Allergies  Allergies as of 03/16/2019   No Known Allergies     Medication List       Accurate as of March 16, 2019 11:43 AM. If you have any questions, ask your nurse or doctor.        STOP taking these medications   ranitidine 150 MG tablet Commonly known as: ZANTAC Stopped by: Sandrea Hughs, NP     TAKE these medications   amiodarone 200 MG tablet Commonly known as: PACERONE Take 200 mg by mouth daily.   atorvastatin 10 MG tablet Commonly known as: LIPITOR Take 10 mg by mouth daily.   bisacodyl 5 MG EC tablet Commonly known as: DULCOLAX Take 5 mg by mouth 2 (two) times daily.   calcium carbonate 1250 MG capsule Take 1,250 mg by mouth 2 (two) times daily with a meal.   cetirizine 10 MG tablet Commonly known as: ZYRTEC Take 10 mg by mouth daily.   edoxaban 30 MG Tabs tablet Commonly known as: SAVAYSA Take 30 mg by mouth daily.   oxycodone 5 MG capsule Commonly known as: OXY-IR Take 5 mg by mouth. Bid prn   pregabalin 25 MG capsule Commonly known as:  LYRICA Take 25 mg by mouth 2 (two) times daily. Takes 1 in am, 2 in afternoon, 1 in pm   shark liver oil-cocoa butter 0.25-3-85.5 % suppository Commonly known as: PREPARATION H Place 1 suppository rectally as needed for hemorrhoids.   TERIPARATIDE (RECOMBINANT) [place] Inject into the skin once. In pm   warfarin 3 MG tablet Commonly known as: COUMADIN Take 3.5 mg by mouth daily.       Review of Systems  Constitutional: Negative for appetite change, chills, fatigue and fever.  HENT: Positive for hearing loss. Negative for congestion, dental problem, rhinorrhea, sinus pressure, sinus pain, sneezing, sore throat, tinnitus and trouble swallowing.        Wears hearing aids. Seasonal allergies   Eyes: Positive for visual disturbance. Negative for discharge, redness and itching.       Wears  eye glasses.  Respiratory: Negative for cough, chest tightness, shortness of breath and wheezing.   Cardiovascular: Negative for chest pain and leg swelling.       Hx Afib   Gastrointestinal: Negative for abdominal distention, abdominal pain, constipation, diarrhea, nausea and vomiting.       Hx hemorrhoids.  Endocrine: Negative for cold intolerance, heat intolerance, polydipsia, polyphagia and polyuria.  Genitourinary: Negative for difficulty urinating, dysuria, flank pain and urgency.       Wears a pad due to leakage   Musculoskeletal: Positive for arthralgias and back pain. Negative for neck pain and neck stiffness.  Skin: Negative for color change, pallor and rash.  Neurological: Negative for dizziness, syncope, weakness, light-headedness, numbness and headaches.  Hematological: Does not bruise/bleed easily.  Psychiatric/Behavioral: Negative for agitation, confusion, sleep disturbance and suicidal ideas. The patient is not nervous/anxious.     Immunization History  Administered Date(s) Administered   Fluad Quad(high Dose 65+) 03/16/2019   Influenza-Unspecified 03/30/2016   Pertinent  Health  Maintenance Due  Topic Date Due   DEXA SCAN  01/06/1998   PNA vac Low Risk Adult (1 of 2 - PCV13) 01/06/1998   INFLUENZA VACCINE  01/29/2019   Fall Risk  03/16/2019 09/19/2016 07/26/2015  Falls in the past year? 0 No No  Number falls in past yr: 0 - -  Injury with Fall? 0 - -    Vitals:   03/16/19 1115  BP: 140/80  Pulse: 71  Temp: 98 F (36.7 C)  SpO2: 92%  Weight: 131 lb 9.6 oz (59.7 kg)  Height: _0  (1.6 m)   Body mass index is 23.31 kg/m. Physical Exam Vitals signs reviewed.  Constitutional:      General: She is not in acute distress.    Appearance: She is normal weight. She is not ill-appearing or diaphoretic.  HENT:     Head: Normocephalic.     Right Ear: There is impacted cerumen.     Left Ear: There is impacted cerumen.     Ears:     Comments: Right ear cerumen lavaged with warm water large cerumen removed with curette.Left ear lavaged and Alligator forcep used to remove cerumen but patient reported discomfort.cerumen removable and lavage stopped.Debrox 6.5% otic solution twice daily x 4 days for patient to instil at home then follow up if lavage desired.       Nose: Nose normal. No congestion or rhinorrhea.     Mouth/Throat:     Mouth: Mucous membranes are moist.     Pharynx: No oropharyngeal exudate or posterior oropharyngeal erythema.  Eyes:     General: No scleral icterus.       Right eye: No discharge.        Left eye: No discharge.     Extraocular Movements: Extraocular movements intact.     Conjunctiva/sclera: Conjunctivae normal.     Pupils: Pupils are equal, round, and reactive to light.  Neck:     Musculoskeletal: Normal range of motion. No neck rigidity or muscular tenderness.     Vascular: No carotid bruit.  Cardiovascular:     Rate and Rhythm: Normal rate and regular rhythm.     Pulses: Normal pulses.     Heart sounds: Normal heart sounds. No murmur. No friction rub. No gallop.   Pulmonary:     Effort: Pulmonary effort is normal. No  respiratory distress.     Breath sounds: Normal breath sounds. No wheezing, rhonchi or rales.  Chest:  Chest wall: No tenderness.  Abdominal:     General: Bowel sounds are normal. There is no distension.     Palpations: Abdomen is soft. There is no mass.     Tenderness: There is no abdominal tenderness. There is no right CVA tenderness, left CVA tenderness, guarding or rebound.  Musculoskeletal: Normal range of motion.        General: No swelling, tenderness or signs of injury.     Right lower leg: No edema.     Left lower leg: No edema.  Lymphadenopathy:     Cervical: No cervical adenopathy.  Skin:    General: Skin is warm and dry.     Coloration: Skin is not pale.     Findings: No bruising, erythema, lesion or rash.  Neurological:     Mental Status: She is alert and oriented to person, place, and time.     Cranial Nerves: No cranial nerve deficit.     Sensory: No sensory deficit.     Motor: No weakness.     Coordination: Coordination normal.     Comments: Gait steady but losses balance   Psychiatric:        Mood and Affect: Mood normal.        Speech: Speech normal.        Behavior: Behavior normal.        Thought Content: Thought content normal.        Judgment: Judgment normal.    Labs reviewed: No results for input(s): NA, K, CL, CO2, GLUCOSE, BUN, CREATININE, CALCIUM, MG, PHOS in the last 8760 hours. No results for input(s): AST, ALT, ALKPHOS, BILITOT, PROT, ALBUMIN in the last 8760 hours. No results for input(s): WBC, NEUTROABS, HGB, HCT, MCV, PLT in the last 8760 hours. No results found for: TSH No results found for: HGBA1C No results found for: CHOL, HDL, LDLCALC, LDLDIRECT, TRIG, CHOLHDL  Significant Diagnostic Results in last 30 days:  No results found.  Assessment/Plan 1. Need for influenza vaccination Afebrile.No signs or symptoms of URI's. - Flu Vaccine QUAD High Dose(Fluad) administered by CMA.  2. Chronic midline low back pain without  sciatica Chronic.No radiation to legs,numbness,tingling,weakness or loss of bladder/bowel control.Negative straight leg raise.  - acetaminophen (TYLENOL) 500 MG tablet; Take 1 tablet (500 mg total) by mouth every 8 (eight) hours as needed for moderate pain.  Dispense: 30 tablet; Refill: 0  3. Age-related osteoporosis without current pathological fracture Last Dexa scan 01/01/2018.continue on Prolia  60 mg injection every 6 months.last dose given in 02/17/2019 due next February,2021.on calcium supplement but not vit D.Will add vit D supplement for absorption of calcium. - calcium-vitamin D (OSCAL WITH D) 500-200 MG-UNIT tablet; Take 1 tablet by mouth 2 (two) times daily.  Dispense: 60 tablet; Refill: 3  4. Atrial fibrillation, unspecified type ([place]) HR controlled.continue on edoxaban 30 mg tablet daily and amiodarone 200 mg tablet daily.  - Ambulatory referral to Cardiology - CBC with Differential/Platelet; Future - CMP with eGFR([facility]); Future - TSH; Future  5. Mixed hyperlipidemia Continue on atorvastatin 10 mg tablet daily.recommended low carbo,low saturated fats and continue with her senior exercises on You tube daily.  - Lipid panel; Future  6. Seasonal allergies Cetirizine 10 mg tablet daily effective.  7. Vision abnormalities - Ambulatory referral to Ophthalmology  8. Bilateral impacted cerumen Right ear cerumen lavaged with warm water large cerumen removed with curette.Left ear lavaged and Alligator forcep used to remove cerumen but patient reported discomfort.cerumen removable and lavage stopped.Debrox 6.5%  otic solution twice daily x 4 days for patient to instil at home then follow up if lavage desired.     Family/ staff Communication: Reviewed plan of care with patient and daughter.   Labs/tests ordered:  - CBC with Differential/Platelet; Future - CMP with eGFR([facility]); Future - TSH; Future - Lipid panel; Future  Sandrea Hughs, NP

## 2019-03-21 ENCOUNTER — Other Ambulatory Visit: Payer: Self-pay

## 2019-03-21 ENCOUNTER — Other Ambulatory Visit: Payer: BC Managed Care – PPO

## 2019-03-21 ENCOUNTER — Ambulatory Visit: Payer: BC Managed Care – PPO | Admitting: Internal Medicine

## 2019-03-21 ENCOUNTER — Ambulatory Visit (INDEPENDENT_AMBULATORY_CARE_PROVIDER_SITE_OTHER): Payer: BC Managed Care – PPO | Admitting: Family

## 2019-03-21 ENCOUNTER — Encounter: Payer: Self-pay | Admitting: Family

## 2019-03-21 VITALS — Ht 63.0 in | Wt 133.8 lb

## 2019-03-21 DIAGNOSIS — H6122 Impacted cerumen, left ear: Secondary | ICD-10-CM | POA: Diagnosis not present

## 2019-03-21 DIAGNOSIS — I4891 Unspecified atrial fibrillation: Secondary | ICD-10-CM | POA: Diagnosis not present

## 2019-03-21 DIAGNOSIS — G8929 Other chronic pain: Secondary | ICD-10-CM | POA: Diagnosis not present

## 2019-03-21 DIAGNOSIS — E782 Mixed hyperlipidemia: Secondary | ICD-10-CM

## 2019-03-21 DIAGNOSIS — M545 Low back pain, unspecified: Secondary | ICD-10-CM

## 2019-03-21 NOTE — Progress Notes (Signed)
Provider: Richarda Blade FNP-C  Sherial Ebrahim, Donalee Citrin, NP  Patient Care Team: Sakiya Stepka, Donalee Citrin, NP as PCP - General (Family Medicine)  Extended Emergency Contact Information Primary Emergency Contact: Dario Guardian Address: [address]          [place], [place] [address] Darden Amber of [place] Home Phone: [phone] Work Phone: [phone] Mobile Phone: [phone] Relation: Daughter Secondary Emergency Contact: williams,dylan Mobile Phone: [phone] Relation: Grandson  Code Status: Full Code  Goals of care: Advanced Directive information Advanced Directives 03/16/2019  Does Patient Have a Medical Advance Directive? Yes  Type of Advance Directive Healthcare Power of Attorney  Does patient want to make changes to medical advance directive? No - Patient declined     Chief Complaint  Patient presents with  . Acute Visit    Impacted left ear and patient would like referral to [facility] to get hearing aids cleaned     HPI:  Pt is a 85 y.o. female seen today  for an acute visit for evaluation of left ear cerumen.she is here escorted by her daughter.she denies any pain in the ear.she states has been applying debrox eye drops as directed on previous visit. She would also like to clarify her pain medication states takes oxycodone 5 mg /5 ml every 4 hours as needed.Also takes Oxycodone 5 mg tablet ER ( Longtec per [place] prescription) twice daily    Past Medical History:  Diagnosis Date  . Allergy   . Arrhythmia   . Atrial fibrillation ([facility])   . GERD (gastroesophageal reflux disease)   . History of bone scan    Bone Density Scans 2017 & 2019 Dr. Dutch Quint   . History of chest x-ray    Apart from large hiatus hernia, normal heart, lungs and mediastinum. [facility]    . History of CT scan of abdomen 10/24/2017   Gallstones within a thin-waled gallbladder, Per records from [facility]   . History of  echocardiogram    [facility]   . History of EKG    Sinus rhythm, borderline 1st deg block, LAD completely changed axis, LBBB(old). Per records from [facility]   . Hyperlipidemia   . Hypertension   . Insomnia   . Left lower lobe pneumonia ([facility]) 09/20/2015   Per records [facility]   . Nodule of right lung    [facility],   . Osteoporosis    on fosamax  . Reduced mobility    Past Surgical History:  Procedure Laterality Date  . APPENDECTOMY  [address] [place]   . CT SCAN     2017, 2018, 2019 -- re lungs see records   . TUBAL LIGATION  1978   [facility] [place] [place]     No Known Allergies  Outpatient Encounter Medications as of 03/21/2019  Medication Sig  . acetaminophen (TYLENOL) 500 MG tablet Take 1 tablet (500 mg total) by mouth every 8 (eight) hours as needed for moderate pain.  [name] amiodarone (PACERONE) 200 MG tablet Take 200 mg by mouth daily.  [name] amLODipine (NORVASC) 5 MG tablet Take 5 mg by mouth daily.  [name] atorvastatin (LIPITOR) 10 MG tablet Take 10 mg by mouth daily.  . bisacodyl (DULCOLAX) 5 MG EC tablet Take 5 mg by mouth 2 (two) times daily.  . calcium carbonate 1250 MG capsule Take 1,250 mg by mouth 2 (two) times daily with a  meal.  . calcium-vitamin D (OSCAL WITH D) 500-200 MG-UNIT tablet Take 1 tablet by mouth 2 (two) times daily.  . cetirizine (ZYRTEC) 10 MG tablet Take 10 mg by mouth daily.  [name] denosumab (PROLIA) 60 MG/ML SOSY injection Inject 60 mg into the skin every 6 (six) months. Last injection in 01/2019  . edoxaban (SAVAYSA) 30 MG TABS tablet Take 30 mg by mouth daily.  [name] oxycodone (OXY-IR) 5 MG capsule Take 5 mg by mouth 2 (two) times daily. Takes 1 in morning and 1 in evening  . oxyCODONE (ROXICODONE) 5 MG/5ML solution Take by mouth every 4 (four) hours as needed for severe pain. 2.5 ml by mouth bid as needed for breakthrough pain  . pregabalin (LYRICA) 25 MG  capsule See admin instructions. Patient takes by mouth  1 (25 mg ) in am, 2 (50 mg ) in afternoon, 1 (25mg )  in pm  . shark liver oil-cocoa butter (PREPARATION H) 0.25-3-85.5 % suppository Place 1 suppository rectally as needed for hemorrhoids.  . [DISCONTINUED] TERIPARATIDE, RECOMBINANT, [place] Inject into the skin once. In pm   No facility-administered encounter medications on file as of 03/21/2019.     Review of Systems  Constitutional: Negative for appetite change, chills, fatigue and fever.  HENT: Positive for hearing loss. Negative for congestion, ear discharge, ear pain, postnasal drip, rhinorrhea, sinus pressure, sinus pain, sneezing, sore throat and tinnitus.   Eyes: Negative for discharge, redness and itching.  Respiratory: Negative for cough, chest tightness, shortness of breath and wheezing.   Cardiovascular: Negative for chest pain, palpitations and leg swelling.  Gastrointestinal: Negative for abdominal distention, abdominal pain, constipation, diarrhea, nausea and vomiting.  Musculoskeletal: Positive for arthralgias and back pain.  Skin: Negative for color change, pallor and rash.  Neurological: Negative for dizziness, weakness, light-headedness, numbness and headaches.    Immunization History  Administered Date(s) Administered  . Fluad Quad(high Dose 65+) 03/16/2019  . Influenza-Unspecified 03/30/2016   Pertinent  Health Maintenance Due  Topic Date Due  . DEXA SCAN  01/06/1998  . PNA vac Low Risk Adult (1 of 2 - PCV13) 01/06/1998  . INFLUENZA VACCINE  Completed   Fall Risk  03/21/2019 03/16/2019 09/19/2016 07/26/2015  Falls in the past year? 0 0 No No  Number falls in past yr: 0 0 - -  Injury with Fall? 0 0 - -    Vitals:   03/21/19 1019  Weight: 133 lb 12.8 oz (60.7 kg)  Height: 5\' 3"  (1.6 m)   Body mass index is 23.7 kg/m. Physical Exam Constitutional:      General: She is not in acute distress.    Appearance: She is normal weight. She is not ill-appearing.   HENT:     Head: Normocephalic.     Right Ear: Tympanic membrane, ear canal and external ear normal. There is no impacted cerumen.     Left Ear: There is impacted cerumen.     Ears:     Comments: Left ear cerumen lavaged with warm water and large amounts removed using curette instrument.patient tolerated procedure well.      Nose: No congestion or rhinorrhea.     Mouth/Throat:     Mouth: Mucous membranes are moist.     Pharynx: Oropharynx is clear. No oropharyngeal exudate or posterior oropharyngeal erythema.  Eyes:     General: No scleral icterus.       Right eye: No discharge.        Left eye: No discharge.  Extraocular Movements: Extraocular movements intact.     Conjunctiva/sclera: Conjunctivae normal.     Pupils: Pupils are equal, round, and reactive to light.  Neck:     Musculoskeletal: Normal range of motion. No neck rigidity or muscular tenderness.  Cardiovascular:     Rate and Rhythm: Normal rate. Rhythm irregular.     Pulses: Normal pulses.     Heart sounds: Normal heart sounds. No murmur. No friction rub. No gallop.   Pulmonary:     Effort: Pulmonary effort is normal. No respiratory distress.     Breath sounds: Normal breath sounds. No wheezing, rhonchi or rales.  Chest:     Chest wall: No tenderness.  Abdominal:     General: Bowel sounds are normal. There is no distension.     Palpations: Abdomen is soft. There is no mass.     Tenderness: There is no abdominal tenderness. There is no right CVA tenderness, left CVA tenderness, guarding or rebound.  Musculoskeletal:        General: No swelling or tenderness.     Right lower leg: No edema.     Left lower leg: No edema.     Comments: Kyphosis present.   Lymphadenopathy:     Cervical: No cervical adenopathy.  Skin:    General: Skin is warm and dry.     Coloration: Skin is not pale.     Findings: No bruising or erythema.  Neurological:     Mental Status: She is alert and oriented to person, place, and time.      Cranial Nerves: No cranial nerve deficit.     Sensory: No sensory deficit.     Motor: No weakness.     Coordination: Coordination normal.     Comments: HOH wears hearing aids   Psychiatric:        Mood and Affect: Mood normal.        Behavior: Behavior normal.        Thought Content: Thought content normal.        Judgment: Judgment normal.     Labs reviewed: No results for input(s): NA, K, CL, CO2, GLUCOSE, BUN, CREATININE, CALCIUM, MG, PHOS in the last 8760 hours. No results for input(s): AST, ALT, ALKPHOS, BILITOT, PROT, ALBUMIN in the last 8760 hours. No results for input(s): WBC, NEUTROABS, HGB, HCT, MCV, PLT in the last 8760 hours. No results found for: TSH No results found for: HGBA1C No results found for: CHOL, HDL, LDLCALC, LDLDIRECT, TRIG, CHOLHDL  Significant Diagnostic Results in last 30 days:  No results found.  Assessment/Plan 1. Impacted cerumen of left ear Afebrile.Left ear cerumen lavaged with warm water and large amounts removed using curette instrument.patient tolerated procedure well.   - Ear Lavage  2. Chronic midline low back pain without sciatica Daughter would like to clarify patient's oxycodone 5 mg tablet ER one by mouth twice daily.According to her [place] medication list she takes oxycodone ER ( Longtec) and not the IR.she will get the medication bottle then notify provider's office.will change to OxyContin 5 mg tablet one by mouth twice daily once medication is verified.continue on oxycodone 5 mg /5 ml every 4 hours as needed.  Family/ staff Communication: Reviewed plan of care with patient and Daughter.   Labs/tests ordered: None   Nyair Depaulo C Sharmel [place], NP

## 2019-03-22 ENCOUNTER — Other Ambulatory Visit: Payer: Self-pay

## 2019-03-22 DIAGNOSIS — M810 Age-related osteoporosis without current pathological fracture: Secondary | ICD-10-CM

## 2019-03-22 DIAGNOSIS — I10 Essential (primary) hypertension: Secondary | ICD-10-CM

## 2019-03-22 DIAGNOSIS — E782 Mixed hyperlipidemia: Secondary | ICD-10-CM

## 2019-03-22 LAB — COMPLETE METABOLIC PANEL WITH GFR
AG Ratio: 1.8 (calc) (ref 1.0–2.5)
ALT: 10 U/L (ref 6–29)
AST: 15 U/L (ref 10–35)
Albumin: 4.2 g/dL (ref 3.6–5.1)
Alkaline phosphatase (APISO): 39 U/L (ref 37–153)
BUN: 15 mg/dL (ref 7–25)
CO2: 29 mmol/L (ref 20–32)
Calcium: 9.1 mg/dL (ref 8.6–10.4)
Chloride: 106 mmol/L (ref 98–110)
Creat: 0.78 mg/dL (ref 0.60–0.88)
GFR, Est African American: 80 mL/min/{1.73_m2} (ref >=60)
GFR, Est Non African American: 69 mL/min/{1.73_m2} (ref >=60)
Globulin: 2.4 g/dL (calc) (ref 1.9–3.7)
Glucose, Bld: 94 mg/dL (ref 65–99)
Potassium: 4 mmol/L (ref 3.5–5.3)
Sodium: 142 mmol/L (ref 135–146)
Total Bilirubin: 0.5 mg/dL (ref 0.2–1.2)
Total Protein: 6.6 g/dL (ref 6.1–8.1)

## 2019-03-22 LAB — LIPID PANEL
Cholesterol: 206 mg/dL — ABNORMAL HIGH (ref <200)
HDL: 75 mg/dL (ref >=50)
LDL Cholesterol (Calc): 112 mg/dL (calc) — ABNORMAL HIGH
Non-HDL Cholesterol (Calc): 131 mg/dL (calc) — ABNORMAL HIGH (ref <130)
Total CHOL/HDL Ratio: 2.7 (calc) (ref <5.0)
Triglycerides: 87 mg/dL (ref <150)

## 2019-03-22 LAB — CBC WITH DIFFERENTIAL/PLATELET
Absolute Monocytes: 518 cells/uL (ref 200–950)
Basophils Absolute: 49 cells/uL (ref 0–200)
Basophils Relative: 0.9 %
Eosinophils Absolute: 92 cells/uL (ref 15–500)
Eosinophils Relative: 1.7 %
HCT: 39.7 % (ref 35.0–45.0)
Hemoglobin: 12.8 g/dL (ref 11.7–15.5)
Lymphs Abs: 2533 cells/uL (ref 850–3900)
MCH: 29.9 pg (ref 27.0–33.0)
MCHC: 32.2 g/dL (ref 32.0–36.0)
MCV: 92.8 fL (ref 80.0–100.0)
MPV: 10.1 fL (ref 7.5–12.5)
Monocytes Relative: 9.6 %
Neutro Abs: 2209 cells/uL (ref 1500–7800)
Neutrophils Relative %: 40.9 %
Platelets: 324 10*3/uL (ref 140–400)
RBC: 4.28 10*6/uL (ref 3.80–5.10)
RDW: 13.6 % (ref 11.0–15.0)
Total Lymphocyte: 46.9 %
WBC: 5.4 10*3/uL (ref 3.8–10.8)

## 2019-03-22 LAB — TSH: TSH: 3.29 mIU/L (ref 0.40–4.50)

## 2019-04-06 ENCOUNTER — Other Ambulatory Visit: Payer: Self-pay | Admitting: *Deleted

## 2019-04-06 MED ORDER — AMIODARONE HCL 200 MG PO TABS: 200.0000 mg | ORAL_TABLET | Freq: Every day | ORAL | 3 refills | Status: DC

## 2019-04-06 MED ORDER — ATORVASTATIN CALCIUM 10 MG PO TABS: 10.0000 mg | ORAL_TABLET | Freq: Every day | ORAL | 3 refills | Status: DC

## 2019-04-06 MED ORDER — AMLODIPINE BESYLATE 5 MG PO TABS: 5.0000 mg | ORAL_TABLET | Freq: Every day | ORAL | 3 refills | Status: DC

## 2019-04-06 MED ORDER — EDOXABAN TOSYLATE 30 MG PO TABS: 30.0000 mg | ORAL_TABLET | Freq: Every day | ORAL | 3 refills | Status: DC

## 2019-04-06 MED ORDER — PREGABALIN 25 MG PO CAPS: ORAL_CAPSULE | ORAL | 3 refills | Status: DC

## 2019-04-06 NOTE — Telephone Encounter (Signed)
Patient daughter called and stated that patient needs refills on medications. Stated that this is the first time refilling through [place].  Pended and sent to [facility] for approval.

## 2019-04-11 ENCOUNTER — Telehealth: Payer: Self-pay | Admitting: *Deleted

## 2019-04-11 NOTE — Telephone Encounter (Signed)
She is taking liquid medication not tablet.

## 2019-04-11 NOTE — Telephone Encounter (Signed)
The only Oxycodone I have come up is Oxycodone IR 5mg . Is this ok to use? Please Advise.

## 2019-04-11 NOTE — Telephone Encounter (Signed)
Daughter called and stated that patient needs a refill on her Oxycodone 5mg  twice daily.   I tried refilling what was placed in system but it will not take. Please Add correct medication into the system of Narcotic you would like to have patient taking.

## 2019-04-11 NOTE — Telephone Encounter (Signed)
Please associate medication so that I can approve for refill

## 2019-04-12 NOTE — Telephone Encounter (Signed)
I don't have a oxyContin 5mg  that comes up in [place]. Only 10,15,20 etc

## 2019-04-12 NOTE — Telephone Encounter (Signed)
OxyContin is an ER tablet since it's given twice daily. May refill OxyContin 5 mg tablet one by mouth twice daily.

## 2019-04-12 NOTE — Telephone Encounter (Signed)
Spoke with Daughter, Bethena Roys and she stated that neither one of these options is a good option. She doesn't want the medication to "knock" the patient out. Wants to discuss with the patient first and then she is going to call us back to schedule a TeleVisit with Dinah to discuss options.

## 2019-04-12 NOTE — Telephone Encounter (Signed)
Notify patient and daughter that Oxycontin ER 5 mg tablet are out of stock per [facility] pharmacist. OxyContin ER 10 mg tablet are available but will have to watch for increased drowsiness.Another option is to increase the frequency for PRN Oxycodone.

## 2019-04-12 NOTE — Telephone Encounter (Signed)
Spoke with daughter and patient is taking both Liquid and tablet. Patient daughter stated that the Liquid is only for Breakthrough pain and they don't use it much and doesn't need a refill on it.   Stated that patient DOES need a refill on the tablet. Stated that she takes Oxycontin ER 5 mg twice daily.   All I have is the IR not ER please add medication. The medication in list is not accepted.

## 2019-04-12 NOTE — Telephone Encounter (Signed)
I spoke with Tammy Walker at [facility] and they stated that Oxycontin ER 5mg  is Out of Stock and not getting in, stated that there is nothing Equivalent. Please Advise.

## 2019-04-12 NOTE — Telephone Encounter (Signed)
Verify with patient's pharmacy if they carry OxyContin 5 mg tablet ER.

## 2019-04-14 ENCOUNTER — Encounter: Payer: Self-pay | Admitting: Family

## 2019-04-14 ENCOUNTER — Ambulatory Visit (INDEPENDENT_AMBULATORY_CARE_PROVIDER_SITE_OTHER): Payer: BC Managed Care – PPO | Admitting: Family

## 2019-04-14 ENCOUNTER — Other Ambulatory Visit: Payer: Self-pay

## 2019-04-14 DIAGNOSIS — G8929 Other chronic pain: Secondary | ICD-10-CM | POA: Diagnosis not present

## 2019-04-14 DIAGNOSIS — M545 Low back pain, unspecified: Secondary | ICD-10-CM

## 2019-04-14 MED ORDER — OXYCODONE HCL ER 10 MG PO T12A: 10.0000 mg | EXTENDED_RELEASE_TABLET | Freq: Every day | ORAL | 0 refills | Status: AC

## 2019-04-14 NOTE — Progress Notes (Signed)
This service is provided via telemedicine  No vital signs collected/recorded due to the encounter was a telemedicine visit.   Location of patient (ex: home, work):  Home   Patient consents to a telephone visit:  Yes  Location of the provider (ex: office, home): Office   Name of any referring provider:  Marlowe Sax, NP   Names of all persons participating in the telemedicine service and their role in the encounter:  Xandrea Clarey NP, Ruthell Rummage CMA, and Sharion Dove with Daughter   Time spent on call:  Ruthell Rummage CMA spent 12  minutes on phone with patient.    Provider: Marlowe Sax FNP-C  Meleana Commerford, Nelda Bucks, NP  Patient Care Team: Kimberle Stanfill, Nelda Bucks, NP as PCP - General (Family Medicine)  Extended Emergency Contact Information Primary Emergency Contact: Marline Backbone Address: [address]          [place], [place] [address] Johnnette Litter of [place] Phone: [phone] Work Phone: [phone] Mobile Phone: [phone] Relation: Daughter Secondary Emergency Contact: [place] Mobile Phone: [phone] Relation: Grandson  Code Status: Full Code  Goals of care: Advanced Directive information Advanced Directives 03/16/2019  Does Patient Have a Medical Advance Directive? Yes  Type of Advance Directive [place]  Does patient want to make changes to medical advance directive? No - Patient declined     Chief Complaint  Patient presents with  . Medical Management of Chronic Issues    Discuss Pain Management Medications     HPI:  Pt is a 85 y.o. female seen today for an acute visit to discuss pain management.she is currently on Oxycodone 5 mg / 5 ml every 4 hours as needed for break through pain and OxyContin ER 5 mg tablet twice daily previously filled by her PCP prior to relocating from [place] to [place] Patient unable to obtain OxyContin ER 5 mg tablet from her pharmacy.[place] contacted pharmacist who stated OxyContin ER 5 mg  is unavailable.will need to switch medication. Patient thinks the OxyContin ER 10 mg tablet twice daily will be too strong.Her daughter also agrees states patient has taken medication in the past twice daily when she was discharged from the hospital.she would like to try taking OxyContin ER 10 mg once a day then if pain is not controlled she can notify provider to take twice daily. Her pain has been under control with her regimen.she will also take her Pregabalin 25 mg capsule three times daily instead of four times daily.     Past Medical History:  Diagnosis Date  . Allergy   . Arrhythmia   . Atrial fibrillation ([place])   . GERD (gastroesophageal reflux disease)   . History of bone scan    Bone Density Scans 2017 & 2019 Dr. Trenton Gammon   . History of chest x-ray    Apart from large hiatus hernia, normal heart, lungs and mediastinum. Per [facility] from [facility]    . History of CT scan of abdomen 10/24/2017   Gallstones within a thin-waled gallbladder, Per records from [facility]   . [facility] echocardiogram    [facility]   . History of EKG    Sinus rhythm, borderline 1st deg block, LAD completely changed axis, LBBB(old). Per records [facility]   . Hyperlipidemia   . Hypertension   . Insomnia   . Left lower lobe pneumonia 09/20/2015   Per records from [facility]   . Nodule of  right lung    Per records from [facility],   . Osteoporosis    on fosamax  . Reduced mobility    Past Surgical History:  Procedure Laterality Date  . APPENDECTOMY  [address] [place]   . CT SCAN     2017, 2018, 2019 -- re lungs see records   . TUBAL LIGATION  [facility]   [facility] [place] [place]     No Known Allergies  Outpatient Encounter Medications as of 04/14/2019  Medication Sig  . acetaminophen (TYLENOL) 500 MG tablet Take 1 tablet (500 mg total) by mouth every 8 (eight) hours as  needed for moderate pain. (Patient taking differently: Take 500 mg by mouth every 8 (eight) hours as needed for moderate pain. Currently taking 3 times a day)  . amiodarone (PACERONE) 200 MG tablet Take 1 tablet (200 mg total) by mouth daily.  [name] amLODipine (NORVASC) 5 MG tablet Take 1 tablet (5 mg total) by mouth daily.  [name] atorvastatin (LIPITOR) 10 MG tablet Take 1 tablet (10 mg total) by mouth daily.  . bisacodyl (DULCOLAX) 5 MG EC tablet Take 5 mg by mouth 2 (two) times daily.  . calcium-vitamin D (OSCAL WITH D) 500-200 MG-UNIT tablet Take 1 tablet by mouth 2 (two) times daily.  . cetirizine (ZYRTEC) 10 MG tablet Take 10 mg by mouth daily.  [name] denosumab (PROLIA) 60 MG/ML SOSY injection Inject 60 mg into the skin every 6 (six) months. Last injection in 01/2019  . edoxaban (SAVAYSA) 30 MG TABS tablet Take 1 tablet (30 mg total) by mouth daily.  [name] oxyCODONE (ROXICODONE) 5 MG/5ML solution Take by mouth every 4 (four) hours as needed for severe pain. 2.5 ml by mouth bid as needed for breakthrough pain  . oxyCODONE HCl (OXYCONTIN PO) Take 5 mg ER by mouth 2 times daily once in morning and once in evening  . pregabalin (LYRICA) 25 MG capsule Patient takes by mouth  1 (25 mg ) in am, 2 (50 mg ) in afternoon, 1 (25mg )  in pm  . shark liver oil-cocoa butter (PREPARATION H) 0.25-3-85.5 % suppository Place 1 suppository rectally as needed for hemorrhoids.  . calcium carbonate 1250 MG capsule Take 1,250 mg by mouth 2 (two) times daily with a meal.   No facility-administered encounter medications on file as of 04/14/2019.     Review of Systems  Constitutional: Negative for appetite change, chills, fatigue and fever.  Eyes: Negative for discharge, redness and itching.  Respiratory: Negative for cough, chest tightness, shortness of breath and wheezing.   Cardiovascular: Negative for chest pain, palpitations and leg swelling.  Gastrointestinal: Negative for abdominal distention, abdominal pain, constipation,  diarrhea, nausea and vomiting.  Musculoskeletal: Positive for arthralgias and back pain. Negative for gait problem.  Skin: Negative for color change, pallor and rash.  Neurological: Negative for dizziness, light-headedness and headaches.  Psychiatric/Behavioral: Negative for agitation, confusion and sleep disturbance. The patient is not nervous/anxious.     Immunization History  Administered Date(s) Administered  . Fluad Quad(high Dose 65+) 03/16/2019  . Influenza-Unspecified 03/30/2016   Pertinent  Health Maintenance Due  Topic Date Due  . DEXA SCAN  01/06/1998  . PNA vac Low Risk Adult (1 of 2 - PCV13) 01/06/1998  . INFLUENZA VACCINE  Completed   Fall Risk  04/14/2019 03/21/2019 03/16/2019 09/19/2016 07/26/2015  Falls in the past year? 0 0 0 No No  Number falls in past yr: 0 0 0 - -  Injury with  Fall? 0 0 0 - -    There were no vitals filed for this visit. There is no height or weight on file to calculate BMI. Physical Exam  Unable to complete on telephone visit   Labs reviewed: Recent Labs    03/21/19 0812  NA 142  K 4.0  CL 106  CO2 29  GLUCOSE 94  BUN 15  CREATININE 0.78  CALCIUM 9.1   Recent Labs    03/21/19 0812  AST 15  ALT 10  BILITOT 0.5  PROT 6.6   Recent Labs    03/21/19 0812  WBC 5.4  NEUTROABS 2,209  HGB 12.8  HCT 39.7  MCV 92.8  PLT 324   Lab Results  Component Value Date   TSH 3.29 03/21/2019   No results found for: HGBA1C Lab Results  Component Value Date   CHOL 206 (H) 03/21/2019   HDL 75 03/21/2019   LDLCALC 112 (H) 03/21/2019   TRIG 87 03/21/2019   CHOLHDL 2.7 03/21/2019    Significant Diagnostic Results in last 30 days:  No results found.  Assessment/Plan  Chronic midline low back pain without sciatica Pain under control with current pain regimen but OxyContin 5 mg ER tablet not available per pharmacy.Patient and daughter would like patient to try taking OxyContin 10 mg ER tablet daily at bedtime.she has taken OxyContin  10 mg Er tablet in the past.continue on Oxycodone 5 mg/25ml every 4 hours as needed for pain. She will notify provider's office if pain not under control.   - oxyCODONE (OXYCONTIN) 10 mg 12 hr tablet; Take 1 tablet (10 mg total) by mouth at bedtime.  Dispense: 30 tablet; Refill: 0   Family/ staff Communication: Reviewed plan of care with patient and Daughter   Labs/tests ordered: None   Spent 13 minutes of non-face to face with patient    Caesar Bookmaninah C Mong Neal, NP

## 2019-04-14 NOTE — Telephone Encounter (Signed)
Daughter Bethena Roys called and requested an appointment to discuss pain medication management. Scheduled appointment with patient with provider today as a tele-visit.

## 2019-04-20 ENCOUNTER — Telehealth: Payer: Self-pay | Admitting: Family

## 2019-04-20 ENCOUNTER — Telehealth: Payer: Self-pay | Admitting: *Deleted

## 2019-04-20 DIAGNOSIS — M545 Low back pain, unspecified: Secondary | ICD-10-CM

## 2019-04-20 DIAGNOSIS — G8929 Other chronic pain: Secondary | ICD-10-CM

## 2019-04-20 NOTE — Telephone Encounter (Signed)
Tammy Walker, daughter called and stated that patient just had a TeleVisit on 04/14/19. They are requesting a referral to Pain Management to Manage patient's pain medications.  Daughter is going to call back with who they are wanting to go to. Please Advise.

## 2019-04-20 NOTE — Telephone Encounter (Signed)
May refer to pain management of patient's choice.

## 2019-04-20 NOTE — Telephone Encounter (Signed)
pts daughter(Judy) has called to see if they can get referral to [facility]. This is bc ins is not paying well for the meds that she is taking, so they felt she would do better seeing an MD to manage pain rather than daily meds.  Referring to Dr Vira Blanco or any Dr available in that practice.  Thanks,  Vilinda Blanks.

## 2019-04-22 NOTE — Telephone Encounter (Signed)
[facility] to go to [facility].   Referral placed.

## 2019-05-11 DIAGNOSIS — S32000A Wedge compression fracture of unspecified lumbar vertebra, initial encounter for closed fracture: Secondary | ICD-10-CM | POA: Diagnosis not present

## 2019-05-11 DIAGNOSIS — M810 Age-related osteoporosis without current pathological fracture: Secondary | ICD-10-CM | POA: Diagnosis not present

## 2019-05-11 DIAGNOSIS — M549 Dorsalgia, unspecified: Secondary | ICD-10-CM | POA: Diagnosis not present

## 2019-05-11 DIAGNOSIS — M4854XA Collapsed vertebra, not elsewhere classified, thoracic region, initial encounter for fracture: Secondary | ICD-10-CM | POA: Diagnosis not present

## 2019-05-23 ENCOUNTER — Encounter: Payer: Self-pay | Admitting: Cardiology

## 2019-05-23 ENCOUNTER — Ambulatory Visit: Payer: BC Managed Care – PPO | Admitting: Cardiology

## 2019-05-23 ENCOUNTER — Other Ambulatory Visit: Payer: Self-pay

## 2019-05-23 VITALS — BP 142/70 | HR 80 | Ht 63.0 in | Wt 134.0 lb

## 2019-05-23 DIAGNOSIS — I447 Left bundle-branch block, unspecified: Secondary | ICD-10-CM | POA: Diagnosis not present

## 2019-05-23 DIAGNOSIS — I480 Paroxysmal atrial fibrillation: Secondary | ICD-10-CM

## 2019-05-23 DIAGNOSIS — Z7901 Long term (current) use of anticoagulants: Secondary | ICD-10-CM | POA: Diagnosis not present

## 2019-05-23 MED ORDER — AMIODARONE HCL 100 MG PO TABS: 100.0000 mg | ORAL_TABLET | Freq: Every day | ORAL | 3 refills | Status: DC

## 2019-05-23 NOTE — Progress Notes (Signed)
Cardiology Office Note:    Date:  05/23/2019   ID:  Loleta, Frommelt [birth year], MRN [id number]  PCP:  Caesar Bookman, NP  Cardiologist:  Donato Schultz, MD  Electrophysiologist:  None   Referring MD: Caesar Bookman, NP     History of Present Illness:    Tammy Walker is a 85 y.o. female here to evaluate atrial fibrillation.  Recently moved from [place] after her husband died.  She grew up in [place].  She is a retired Optometrist and Conservation officer, nature.  In regards to atrial fibrillation, she has been taking amiodarone 200 mg a day and edoxaban 30 mg a day.  Denies any chest pain palpitations.  Echocardiogram has been performed and shows EF of 60% with mild aortic stenosis and mild regurgitation with dilated ascending aorta.  She has mild to moderate mitral regurgitation as well.  She does have chronic mid back pain.  AFIB 2002. On AMIO since then.  She required a cardioversion back then.  Has not had any recurrent episodes.  Does not feel any palpitations.  Occasionally she will get some substernal chest discomfort at the end of the day where a hot water bottle or ibuprofen gel will help.  This has been going on for years.  She and her husband have walked a marathon back in her 48s.  She is now living with her daughter.  Denies any syncope bleeding orthopnea PND  Past Medical History:  Diagnosis Date  . Allergy   . Arrhythmia   . Atrial fibrillation (HCC)   . GERD (gastroesophageal reflux disease)   . History of bone scan    Bone Density Scans 2017 & 2019 Dr. Dutch Quint   . History of chest x-ray    Apart from large hiatus hernia, normal heart, lungs and mediastinum. Per records from [facility]    . History of CT scan of abdomen 10/24/2017   Gallstones within a thin-waled gallbladder, Per records from [facility]   . [facility] echocardiogram    [facility]   . History of EKG    Sinus rhythm, borderline 1st deg block, LAD  completely changed axis, LBBB(old). Per records from [facility]   . Hyperlipidemia   . Hypertension   . Insomnia   . Left lower lobe pneumonia 09/20/2015   Per records from [facility]   . Nodule of right lung    Per records from [facility],   . Osteoporosis    on fosamax  . Reduced mobility     Past Surgical History:  Procedure Laterality Date  . APPENDECTOMY  [address], [place]   . CT SCAN     2017, 2018, 2019 -- re lungs see records   . TUBAL LIGATION  1978   [facility] [place], [place]     Current Medications: Current Meds  Medication Sig  . amiodarone (PACERONE) 100 MG tablet Take 1 tablet (100 mg total) by mouth daily.  [name] amLODipine (NORVASC) 5 MG tablet Take 1 tablet (5 mg total) by mouth daily.  [name] atorvastatin (LIPITOR) 10 MG tablet Take 1 tablet (10 mg total) by mouth daily.  . bisacodyl (DULCOLAX) 5 MG EC tablet Take 5 mg by mouth 2 (two) times daily.  . calcium carbonate 1250 MG capsule Take 1,250 mg by mouth 2 (two) times daily with a meal.  . calcium-vitamin D (OSCAL WITH D) 500-200 MG-UNIT tablet Take 1  tablet by mouth 2 (two) times daily.  . cetirizine (ZYRTEC) 10 MG tablet Take 10 mg by mouth daily.  [name]. denosumab (PROLIA) 60 MG/ML SOSY injection Inject 60 mg into the skin every 6 (six) months. Last injection in 01/2019  . edoxaban (SAVAYSA) 30 MG TABS tablet Take 1 tablet (30 mg total) by mouth daily.  [name]. HYDROcodone Bitartrate ER 10 MG CP12 Take 1 capsule by mouth every 12 (twelve) hours.  [name]. oxyCODONE (ROXICODONE) 5 MG/5ML solution Take by mouth every 4 (four) hours as needed for severe pain. 2.5 ml by mouth bid as needed for breakthrough pain  . pregabalin (LYRICA) 25 MG capsule Patient takes by mouth  1 (25 mg ) in am, 2 (50 mg ) in afternoon, 1 (25mg )  in pm (Patient taking differently: Patient takes by mouth  1 (25 mg ) in am, 1 (50 mg ) in afternoon, 1 (25mg )  in pm)  . shark liver oil-cocoa butter  (PREPARATION H) 0.25-3-85.5 % suppository Place 1 suppository rectally as needed for hemorrhoids.  . [DISCONTINUED] amiodarone (PACERONE) 200 MG tablet Take 1 tablet (200 mg total) by mouth daily.     Allergies:   Patient has no known allergies.   Social History   Socioeconomic History  . Marital status: Widowed    Spouse name: Not on file  . Number of children: Not on file  . Years of education: Not on file  . Highest education level: Not on file  Occupational History  . Not on file  Social Needs  . Financial resource strain: Not on file  . Food insecurity    Worry: Not on file    Inability: Not on file  . Transportation needs    Medical: Not on file    Non-medical: Not on file  Tobacco Use  . Smoking status: Never Smoker  . Smokeless tobacco: Never Used  Substance and Sexual Activity  . Alcohol use: Yes    Comment: less than one drink a week  . Drug use: No  . Sexual activity: Never    Birth control/protection: None  Lifestyle  . Physical activity    Days per week: Not on file    Minutes per session: Not on file  . Stress: Not on file  Relationships  . Social Musicianconnections    Talks on phone: Not on file    Gets together: Not on file    Attends religious service: Not on file    Active member of club or organization: Not on file    Attends meetings of clubs or organizations: Not on file    Relationship status: Not on file  Other Topics Concern  . Not on file  Social History Narrative   Social History      Diet? I watch my intake of salt, sugar, and fat      Do you drink/eat things with caffeine? Yes       Marital status?      Widowed                               What year were you married? 1960      Do you live in a house, apartment, assisted living, condo, trailer, etc.? House       Is it one or more stories? one      How many persons live in your home? 2      Do you have any  pets in your home? (please list) yes 2 cats       Highest level of education  completed? 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             yes                         Type & how often? Strength & Balance       Advanced Directives      Do you have a living will?  No       Do you have a DNR form?        No                           If not, do you want to discuss one? No       Do you have signed POA/HPOA for forms? Yes       Functional Status      Do you have difficulty bathing or dressing yourself? No       Do you have difficulty preparing food or eating? No       Do you have difficulty managing your medications? No       Do you have difficulty managing your finances? No       Do you have difficulty affording your medications? No         Family History: The patient's family history includes COPD in her mother; Heart disease in her brother, father, and mother; Heart failure in her brother and mother; High blood pressure in her mother; Hyperlipidemia in her daughter; Hypertension in her daughter.  ROS:   Please see the history of present illness.     All other systems reviewed and are negative.  EKGs/Labs/Other Studies Reviewed:    The following studies were reviewed today: Normal EF mild aortic stenosis  EKG:  EKG is  ordered today.  The ekg ordered today demonstrates 05/23/2019-sinus rhythm first-degree AV block 230 ms left bundle branch block personally reviewed  Recent Labs: 03/21/2019: ALT 10; BUN 15; Creat 0.78; Hemoglobin 12.8; Platelets 324; Potassium 4.0; Sodium 142; TSH 3.29  Recent Lipid Panel    Component Value Date/Time   CHOL 206 (H) 03/21/2019 0812   TRIG 87 03/21/2019 0812   HDL 75 03/21/2019 0812   CHOLHDL 2.7 03/21/2019 0812   LDLCALC 112 (H) 03/21/2019 0812    Physical Exam:    VS:  BP (!) 142/70   Pulse 80   Ht 5\' 3"  (1.6 m)   Wt 134 lb (60.8 kg)   SpO2 92%   BMI 23.74 kg/m     Wt Readings from Last 3 Encounters:   05/23/19 134 lb (60.8 kg)  03/21/19 133 lb 12.8 oz (60.7 kg)  03/16/19 131 lb 9.6 oz (59.7 kg)     GEN:  Well nourished, well developed in no acute distress HEENT: Normal NECK: No JVD; No carotid bruits, kyphosis LYMPHATICS: No lymphadenopathy CARDIAC: RRR, 2/6 systolic murmur, no rubs, gallops RESPIRATORY:  Clear to auscultation without rales, wheezing or rhonchi  ABDOMEN: Soft, non-tender, non-distended MUSCULOSKELETAL:  No edema; No deformity  SKIN: Warm and dry NEUROLOGIC:  Alert and oriented x 3 PSYCHIATRIC:  Normal affect   ASSESSMENT:    1. Paroxysmal atrial fibrillation (HCC)  2. Left bundle branch block   3. Chronic anticoagulation    PLAN:    In order of problems listed above:  Atrial fibrillation paroxysmal -By history only had one episode in 2002, she was placed on amiodarone then and cardioverted.  Took a few shocks to be successful she states. -Currently on amiodarone 200 mg a day since then.  We have proposes that we decrease the amiodarone down to 100 mg a day to reduce overall dosing. normal ejection fraction.  Mild to moderate mitral regurgitation.  She is on edoxaban for anticoagulation.  Hemoglobin 12.8 creatinine 0.7.  LDL 112.  Continue to monitor TSH, periodic chest x-ray, hemoglobin etc.  Obviously we talked about potential pros and cons of decreasing amiodarone.  Cumulative dosing etc.  Chronic anticoagulation -Currently taking edoxaban 30 mg a day.  She was transitioned to this from warfarin in [place] when Covid started.  Left bundle branch block -Seen on ECG strip on 02/13/2018.  On amiodarone.  But increased risk for pacemaker needs in the future.  Here with her daughter 6 months  Medication Adjustments/Labs and Tests Ordered: Current medicines are reviewed at length with the patient today.  Concerns regarding medicines are outlined above.  Orders Placed This Encounter  Procedures  . EKG 12-Lead   Meds ordered this encounter  Medications   . amiodarone (PACERONE) 100 MG tablet    Sig: Take 1 tablet (100 mg total) by mouth daily.    Dispense:  90 tablet    Refill:  3    Dose decreased    Patient Instructions  Medication Instructions:  Decrease your amiodarone to 100 mg per day.   *If you need a refill on your cardiac medications before your next appointment, please call your pharmacy*  Follow-Up: At [facility], you and your health needs are our priority.  As part of our continuing mission to provide you with exceptional heart care, we have created designated Provider Care Teams.  These Care Teams include your primary Cardiologist (physician) and Advanced Practice Providers (APPs -  Physician Assistants and Nurse Practitioners) who all work together to provide you with the care you need, when you need it.  Your next appointment:   6 month(s)  The format for your next appointment:   In Person  Provider:   You may see Donato Schultz, MD or one of the following Advanced Practice Providers on your designated Care Team:    Norma Fredrickson, NP  Nada Boozer, NP  Georgie Chard, NP      Signed, Donato Schultz, MD  05/23/2019 3:38 PM    [facility]

## 2019-05-23 NOTE — Patient Instructions (Signed)
Medication Instructions:  Decrease your amiodarone to 100 mg per day.   *If you need a refill on your cardiac medications before your next appointment, please call your pharmacy*  Follow-Up: At [facility], you and your health needs are our priority.  As part of our continuing mission to provide you with exceptional heart care, we have created designated Provider Care Teams.  These Care Teams include your primary Cardiologist (physician) and Advanced Practice Providers (APPs -  Physician Assistants and Nurse Practitioners) who all work together to provide you with the care you need, when you need it.  Your next appointment:   6 month(s)  The format for your next appointment:   In Person  Provider:   You may see Candee Furbish, MD or one of the following Advanced Practice Providers on your designated Care Team:    Truitt Merle, NP  Cecilie Kicks, NP  Kathyrn Drown, NP

## 2019-05-25 ENCOUNTER — Other Ambulatory Visit: Payer: Self-pay | Admitting: *Deleted

## 2019-05-25 MED ORDER — AMLODIPINE BESYLATE 5 MG PO TABS: 5.0000 mg | ORAL_TABLET | Freq: Every day | ORAL | 1 refills | Status: DC

## 2019-05-25 MED ORDER — EDOXABAN TOSYLATE 30 MG PO TABS: 30.0000 mg | ORAL_TABLET | Freq: Every day | ORAL | 1 refills | Status: DC

## 2019-05-25 MED ORDER — ATORVASTATIN CALCIUM 10 MG PO TABS: 10.0000 mg | ORAL_TABLET | Freq: Every day | ORAL | 1 refills | Status: DC

## 2019-06-06 DIAGNOSIS — M810 Age-related osteoporosis without current pathological fracture: Secondary | ICD-10-CM | POA: Diagnosis not present

## 2019-06-06 DIAGNOSIS — M549 Dorsalgia, unspecified: Secondary | ICD-10-CM | POA: Diagnosis not present

## 2019-06-06 DIAGNOSIS — M4854XA Collapsed vertebra, not elsewhere classified, thoracic region, initial encounter for fracture: Secondary | ICD-10-CM | POA: Diagnosis not present

## 2019-06-06 DIAGNOSIS — S32000A Wedge compression fracture of unspecified lumbar vertebra, initial encounter for closed fracture: Secondary | ICD-10-CM | POA: Diagnosis not present

## 2019-06-23 NOTE — Telephone Encounter (Signed)
Webb Silversmith is out of office. Message forwarded to covering provider Sherrie Mustache, NP

## 2019-07-08 ENCOUNTER — Other Ambulatory Visit: Payer: Self-pay

## 2019-07-08 ENCOUNTER — Telehealth: Payer: Self-pay | Admitting: *Deleted

## 2019-07-08 MED ORDER — AMIODARONE HCL 200 MG PO TABS: 100.0000 mg | ORAL_TABLET | Freq: Every day | ORAL | 3 refills | Status: DC

## 2019-07-08 MED ORDER — HYDROCODONE BITARTRATE ER 10 MG PO CP12: 1.0000 | ORAL_CAPSULE | Freq: Two times a day (BID) | ORAL | 0 refills | Status: DC

## 2019-07-08 NOTE — Telephone Encounter (Signed)
Routed to Dinah NP 

## 2019-07-08 NOTE — Telephone Encounter (Signed)
Patient is asking about pain prescription through mychart. Please Advise.

## 2019-07-08 NOTE — Telephone Encounter (Signed)
Hello Dr. Anne Fu -   We have just finished up with the half-tablets of 200mg  amiodorone, and went to fill the prescription for the 100mg  tablets that you prescribed.  We were surprised to find that the copay was far more for the lower dose!  The pharmacist was very helpful (and apologetic!) and explained that they can only get the 100mg  tablets from a more expensive manufacturer.    It would be significantly cheaper, therefore, for to buy the 200mg  tablets with instructions to "take 1/2 tablet daily."  The pharmacist is of course glad to provide that, and confirmed that it would be a much lower copay, but he would need to you change the prescription.  If it's not too much trouble, would you be kind enough to contact [facility] at [facility]) and make that change?  THANK YOU very much!  [place]  (& )

## 2019-07-08 NOTE — Telephone Encounter (Signed)
Message routed to [facility] and medication pended

## 2019-07-27 ENCOUNTER — Other Ambulatory Visit: Payer: Self-pay

## 2019-07-27 DIAGNOSIS — G8929 Other chronic pain: Secondary | ICD-10-CM

## 2019-07-27 DIAGNOSIS — M545 Low back pain, unspecified: Secondary | ICD-10-CM

## 2019-07-27 MED ORDER — ATORVASTATIN CALCIUM 10 MG PO TABS: 10.0000 mg | ORAL_TABLET | Freq: Every day | ORAL | 1 refills | Status: DC

## 2019-07-27 MED ORDER — PREGABALIN 25 MG PO CAPS: ORAL_CAPSULE | ORAL | 3 refills | Status: DC

## 2019-07-27 MED ORDER — HYDROCODONE BITARTRATE ER 10 MG PO CP12: 1.0000 | ORAL_CAPSULE | Freq: Two times a day (BID) | ORAL | 0 refills | Status: DC

## 2019-07-27 MED ORDER — AMLODIPINE BESYLATE 5 MG PO TABS: 5.0000 mg | ORAL_TABLET | Freq: Every day | ORAL | 1 refills | Status: DC

## 2019-07-27 MED ORDER — OXYCODONE HCL 5 MG/5ML PO SOLN: 2.5000 mg | ORAL | 0 refills | Status: AC | PRN

## 2019-07-27 MED ORDER — EDOXABAN TOSYLATE 30 MG PO TABS: 30.0000 mg | ORAL_TABLET | Freq: Every day | ORAL | 1 refills | Status: DC

## 2019-07-28 ENCOUNTER — Ambulatory Visit: Payer: BC Managed Care – PPO

## 2019-07-29 ENCOUNTER — Telehealth: Payer: Self-pay | Admitting: *Deleted

## 2019-07-29 DIAGNOSIS — M545 Low back pain, unspecified: Secondary | ICD-10-CM

## 2019-07-29 DIAGNOSIS — G8929 Other chronic pain: Secondary | ICD-10-CM | POA: Insufficient documentation

## 2019-07-29 NOTE — Telephone Encounter (Signed)
She takes Lyrica for chronic low back pain.

## 2019-07-29 NOTE — Telephone Encounter (Signed)
Prior Authorization initiated through Cover My Meds.  Went into determination. Awaiting response.

## 2019-07-29 NOTE — Telephone Encounter (Signed)
Received a Prior Authorization request from [facility] for Pregabalin 25mg .  I reviewed patient's Problem list and don't see a supporting Diagnosis.  What can I use for a Diagnosis to initiate the Prior Authorization. Please Advise.    Key: 

## 2019-08-01 NOTE — Telephone Encounter (Signed)
Received Fax from [facility] stating Pregabalin has been APPROVED through 07/28/2020.

## 2019-08-06 ENCOUNTER — Ambulatory Visit: Payer: BC Managed Care – PPO | Attending: Internal Medicine

## 2019-08-06 DIAGNOSIS — Z23 Encounter for immunization: Secondary | ICD-10-CM | POA: Insufficient documentation

## 2019-08-06 NOTE — Progress Notes (Signed)
   Covid-19 Vaccination Clinic  Name:  Tammy Walker    MRN: [id number] DOB: October 20, 1930  08/06/2019  [name] was observed post Covid-19 immunization for 15 minutes without incidence. She was provided with Vaccine Information Sheet and instruction to access the V-Safe system.   [name] was instructed to call 911 with any severe reactions post vaccine: [name] Difficulty breathing  . Swelling of your face and throat  . A fast heartbeat  . A bad rash all over your body  . Dizziness and weakness    Immunizations Administered    Name Date Dose VIS Date Route   Pfizer COVID-19 Vaccine 08/06/2019  8:27 AM 0.3 mL 06/10/2019 Intramuscular   Manufacturer: [employer], [employer]   Lot: [id number]   NDC: [address]-5

## 2019-08-08 ENCOUNTER — Ambulatory Visit: Payer: BC Managed Care – PPO

## 2019-08-15 ENCOUNTER — Other Ambulatory Visit: Payer: Medicare Other

## 2019-08-15 ENCOUNTER — Other Ambulatory Visit: Payer: Self-pay

## 2019-08-15 DIAGNOSIS — I10 Essential (primary) hypertension: Secondary | ICD-10-CM | POA: Diagnosis not present

## 2019-08-15 DIAGNOSIS — E782 Mixed hyperlipidemia: Secondary | ICD-10-CM

## 2019-08-15 DIAGNOSIS — M810 Age-related osteoporosis without current pathological fracture: Secondary | ICD-10-CM | POA: Diagnosis not present

## 2019-08-16 NOTE — Telephone Encounter (Signed)
Tammy Walker and [name], I'm trying to help this patient get Prolia but her last Bone Density was either in 2017 or 2019. We need her T-score to run her authorization.

## 2019-08-16 NOTE — Telephone Encounter (Signed)
Will order Bone Density to be done prior to Prolia.

## 2019-08-18 LAB — COMPLETE METABOLIC PANEL WITH GFR
AG Ratio: 1.6 (calc) (ref 1.0–2.5)
ALT: 7 U/L (ref 6–29)
AST: 14 U/L (ref 10–35)
Albumin: 4.3 g/dL (ref 3.6–5.1)
Alkaline phosphatase (APISO): 38 U/L (ref 37–153)
BUN: 14 mg/dL (ref 7–25)
CO2: 27 mmol/L (ref 20–32)
Calcium: 9.5 mg/dL (ref 8.6–10.4)
Chloride: 106 mmol/L (ref 98–110)
Creat: 0.85 mg/dL (ref 0.60–0.88)
GFR, Est African American: 72 mL/min/{1.73_m2} (ref >=60)
GFR, Est Non African American: 62 mL/min/{1.73_m2} (ref >=60)
Globulin: 2.7 g/dL (calc) (ref 1.9–3.7)
Glucose, Bld: 85 mg/dL (ref 65–99)
Potassium: 3.9 mmol/L (ref 3.5–5.3)
Sodium: 140 mmol/L (ref 135–146)
Total Bilirubin: 0.7 mg/dL (ref 0.2–1.2)
Total Protein: 7 g/dL (ref 6.1–8.1)

## 2019-08-18 LAB — TSH: TSH: 2.86 mIU/L (ref 0.40–4.50)

## 2019-08-18 LAB — VITAMIN D 1,25 DIHYDROXY
Vitamin D 1, 25 (OH)2 Total: 43 pg/mL (ref 18–72)
Vitamin D2 1, 25 (OH)2: <8 pg/mL
Vitamin D3 1, 25 (OH)2: 43 pg/mL

## 2019-08-18 LAB — LIPID PANEL
Cholesterol: 213 mg/dL — ABNORMAL HIGH (ref <200)
HDL: 79 mg/dL (ref >=50)
LDL Cholesterol (Calc): 110 mg/dL (calc) — ABNORMAL HIGH
Non-HDL Cholesterol (Calc): 134 mg/dL (calc) — ABNORMAL HIGH (ref <130)
Total CHOL/HDL Ratio: 2.7 (calc) (ref <5.0)
Triglycerides: 128 mg/dL (ref <150)

## 2019-08-18 LAB — CBC WITH DIFFERENTIAL/PLATELET

## 2019-08-19 NOTE — Telephone Encounter (Signed)
[name] can you please help answer these questions?

## 2019-08-22 ENCOUNTER — Ambulatory Visit (INDEPENDENT_AMBULATORY_CARE_PROVIDER_SITE_OTHER): Payer: BC Managed Care – PPO | Admitting: Family

## 2019-08-22 ENCOUNTER — Other Ambulatory Visit: Payer: Self-pay

## 2019-08-22 ENCOUNTER — Encounter: Payer: Self-pay | Admitting: Family

## 2019-08-22 VITALS — BP 148/80 | HR 64 | Temp 97.8°F | Resp 18 | Ht 63.0 in | Wt 131.0 lb

## 2019-08-22 DIAGNOSIS — J302 Other seasonal allergic rhinitis: Secondary | ICD-10-CM

## 2019-08-22 DIAGNOSIS — I10 Essential (primary) hypertension: Secondary | ICD-10-CM

## 2019-08-22 DIAGNOSIS — I4891 Unspecified atrial fibrillation: Secondary | ICD-10-CM | POA: Diagnosis not present

## 2019-08-22 DIAGNOSIS — E782 Mixed hyperlipidemia: Secondary | ICD-10-CM

## 2019-08-22 DIAGNOSIS — M810 Age-related osteoporosis without current pathological fracture: Secondary | ICD-10-CM

## 2019-08-22 DIAGNOSIS — Z23 Encounter for immunization: Secondary | ICD-10-CM

## 2019-08-22 LAB — CBC WITH DIFFERENTIAL/PLATELET
Absolute Monocytes: 484 cells/uL (ref 200–950)
Basophils Absolute: 71 cells/uL (ref 0–200)
Basophils Relative: 1.2 %
Eosinophils Absolute: 41 cells/uL (ref 15–500)
Eosinophils Relative: 0.7 %
HCT: 40.6 % (ref 35.0–45.0)
Hemoglobin: 13.2 g/dL (ref 11.7–15.5)
Lymphs Abs: 2006 cells/uL (ref 850–3900)
MCH: 29.7 pg (ref 27.0–33.0)
MCHC: 32.5 g/dL (ref 32.0–36.0)
MCV: 91.4 fL (ref 80.0–100.0)
MPV: 9.9 fL (ref 7.5–12.5)
Monocytes Relative: 8.2 %
Neutro Abs: 3298 cells/uL (ref 1500–7800)
Neutrophils Relative %: 55.9 %
Platelets: 310 10*3/uL (ref 140–400)
RBC: 4.44 10*6/uL (ref 3.80–5.10)
RDW: 13 % (ref 11.0–15.0)
Total Lymphocyte: 34 %
WBC: 5.9 10*3/uL (ref 3.8–10.8)

## 2019-08-22 MED ORDER — DENOSUMAB 60 MG/ML ~~LOC~~ SOSY
60.0000 mg | PREFILLED_SYRINGE | Freq: Once | SUBCUTANEOUS | Status: AC
  Administered 2019-08-22: 60 mg via SUBCUTANEOUS

## 2019-08-22 MED ORDER — TETANUS-DIPHTH-ACELL PERTUSSIS 5-2.5-18.5 LF-MCG/0.5 IM SUSP: 0.5000 mL | Freq: Once | INTRAMUSCULAR | 0 refills | Status: AC

## 2019-08-22 NOTE — Progress Notes (Signed)
Provider: Marlowe Sax FNP-C   Mikey Maffett, Nelda Bucks, NP  Patient Care Team: Rim Thatch, Nelda Bucks, NP as PCP - General (Family Medicine) Jerline Pain, MD as PCP - Cardiology (Cardiology)  Extended Emergency Contact Information Primary Emergency Contact: Marline Backbone Address: [address]          [place], [place] [address] Johnnette Litter of [place] Phone: [phone] Work Phone: [phone] Mobile Phone: [phone] Relation: Daughter Secondary Emergency Contact: [place] Mobile Phone: [phone] Relation: Grandson  Code Status: Full Code  Goals of care: Advanced Directive information Advanced Directives 03/16/2019  Does Patient Have a Medical Advance Directive? Yes  Type of Advance Directive [place]  Does patient want to make changes to medical advance directive? No - Patient declined     Chief Complaint  Patient presents with  . Medical Management of Chronic Issues    FOLLOW-UP    HPI:  Pt is a 86 y.o. female seen today for 6 months follow up for medical management of chronic diseases.She denies any acute issues this visit.she is here with her daughter Bethena Roys.she state has had her first dose of COVID-19 vaccine given 08/06/2019 second dose due 08/30/2019.she had no acute reaction. Her chronic back pain under control on current medication. She had lab worker done recently LDL 110,TRG 128,Total chol 213.CBC was not drawn unclear why.on atorvastatin 10 mg tablet daily.  Hypertension - denies any headache,dizziness,fatigue,chest pain or shortness of breath.on Amlodipine 5 mg tablet daily   Afib - on edoxaban 30 mg tablet daily.No Palpitation chest painor shortness of breath reported.On chart review she was seen by Cardiologist Dr.Skains Elta Guadeloupe at [facility] 05/23/2019 to establish care since moving from [place].Amiodrone was decreased from 200 mg tablet to 100 mg tablet daily.she was advised to follow up in 6 months.  Osteoporosis - due for  her Prolia injection today.Vit D level reviewed normal.due for Bone density.Will order this today.she reports no fall episodes  or fracture she does You tube senior exercise x 5 days a week for 10-20 minutes.Also walks when weather permits. On calcium-Vit D 500 - 200 mg unit tablet one by mouth twice daily.  Health maintenance - Due for Tdap vaccine.Advised to get this at least one month after she completes he COVID-19 vaccine to prevent any interaction.   Past Medical History:  Diagnosis Date  . Allergy   . Arrhythmia   . Atrial fibrillation ([place])   . GERD (gastroesophageal reflux disease)   . History of bone scan    Bone Density Scans 2017 & 2019 Dr. Trenton Gammon   . History of chest x-ray    Apart from large hiatus hernia, normal heart, lungs and mediastinum. Per records from [facility]    . History of CT scan of abdomen 10/24/2017   Gallstones within a thin-waled gallbladder, Per records from [facility]   . History of echocardiogram    [facility]  [facility]   . History of EKG    Sinus rhythm, borderline 1st deg block, LAD completely changed axis, LBBB(old). Per records from [facility]   . Hyperlipidemia   . Hypertension   . Insomnia   . Left lower lobe pneumonia 09/20/2015   Per records from [facility]   . Nodule of right lung    Per records from [facility],   . Osteoporosis    on fosamax  . Reduced mobility    Past Surgical History:  Procedure Laterality Date  .  [place], [place]   . CT SCAN     2017, 2018, 2019 -- re lungs see records   . Between, [place], [place]     No Known Allergies  Allergies as of 08/22/2019   No Known Allergies     Medication List       Accurate as of August 22, 2019 10:56 AM. If you have any questions, ask your nurse or doctor.        amiodarone 200 MG tablet Commonly known as: PACERONE Take  0.5 tablets (100 mg total) by mouth daily.   amLODipine 5 MG tablet Commonly known as: NORVASC Take 1 tablet (5 mg total) by mouth daily.   atorvastatin 10 MG tablet Commonly known as: LIPITOR Take 1 tablet (10 mg total) by mouth daily.   bisacodyl 5 MG EC tablet Commonly known as: DULCOLAX Take 10 mg by mouth once.   calcium carbonate 1250 MG capsule Take 1,250 mg by mouth 2 (two) times daily with a meal.   calcium-vitamin D 500-200 MG-UNIT tablet Commonly known as: OSCAL WITH D Take 1 tablet by mouth 2 (two) times daily.   cetirizine 10 MG tablet Commonly known as: ZYRTEC Take 10 mg by mouth daily.   denosumab 60 MG/ML Sosy injection Commonly known as: PROLIA Inject 60 mg into the skin every 6 (six) months. Last injection in 01/2019   edoxaban 30 MG Tabs tablet Commonly known as: SAVAYSA Take 1 tablet (30 mg total) by mouth daily.   HYDROcodone Bitartrate ER 10 MG Cp12 Take 1 capsule by mouth every 12 (twelve) hours.   oxyCODONE 5 MG/5ML solution Commonly known as: ROXICODONE Take 2.5 mLs (2.5 mg total) by mouth every 4 (four) hours as needed for severe pain. 2.5 ml by mouth bid as needed for breakthrough pain   pregabalin 25 MG capsule Commonly known as: LYRICA Patient takes by mouth  1 (25 mg ) in am, 2 (50 mg ) in afternoon, 1 (62m)  in pm   shark liver oil-cocoa butter 0.25-3-85.5 % suppository Commonly known as: PREPARATION H Place 1 suppository rectally as needed for hemorrhoids.       Review of Systems  Constitutional: Negative for appetite change, chills, fatigue and fever.  HENT: Positive for hearing loss. Negative for congestion, rhinorrhea, sinus pressure, sinus pain, sneezing, sore throat and trouble swallowing.   Eyes: Negative for discharge, redness and itching.  Respiratory: Negative for cough, chest tightness, shortness of breath and wheezing.   Cardiovascular: Negative for chest pain, palpitations and leg swelling.  Gastrointestinal:  Negative for abdominal distention, abdominal pain, constipation, diarrhea, nausea and vomiting.  Endocrine: Negative for cold intolerance, heat intolerance, polydipsia, polyphagia and polyuria.  Genitourinary: Negative for decreased urine volume, difficulty urinating, dysuria, frequency, hematuria, urgency and vaginal bleeding.  Musculoskeletal: Positive for arthralgias and back pain.  Skin: Negative for color change, pallor and rash.  Neurological: Negative for dizziness, speech difficulty, weakness, light-headedness and numbness.  Hematological: Does not bruise/bleed easily.  Psychiatric/Behavioral: Negative for agitation, confusion and sleep disturbance. The patient is not nervous/anxious.     Immunization History  Administered Date(s) Administered  . Fluad Quad(high Dose 65+) 03/16/2019  . Influenza-Unspecified 03/30/2016  . PFIZER SARS-COV-2 Vaccination 08/06/2019  . Pneumococcal Polysaccharide-23 06/30/2013   Pertinent  Health Maintenance Due  Topic Date Due  . DEXA SCAN  01/06/1998  . PNA vac Low Risk Adult (2 of 2 - PCV13) 06/30/2014  . INFLUENZA  VACCINE  Completed   Fall Risk  08/22/2019 04/14/2019 03/21/2019 03/16/2019 09/19/2016  Falls in the past year? 0 0 0 0 No  Number falls in past yr: 0 0 0 0 -  Injury with Fall? 0 0 0 0 -    Vitals:   08/22/19 1042  BP: (!) 148/80  Pulse: 64  Temp: 97.8 F (36.6 C)  TempSrc: Oral  SpO2: 98%  Weight: 131 lb (59.4 kg)  Height: 5' 3"  (1.6 m)   Body mass index is 23.21 kg/m. Physical Exam Vitals reviewed.  Constitutional:      General: She is not in acute distress.    Appearance: She is not ill-appearing.  HENT:     Head: Normocephalic.     Right Ear: Tympanic membrane, ear canal and external ear normal. There is no impacted cerumen.     Left Ear: Tympanic membrane, ear canal and external ear normal. There is no impacted cerumen.     Nose: Nose normal. No congestion or rhinorrhea.     Mouth/Throat:     Mouth: Mucous  membranes are moist.     Pharynx: Oropharynx is clear. No oropharyngeal exudate or posterior oropharyngeal erythema.  Eyes:     General: No scleral icterus.       Right eye: No discharge.        Left eye: No discharge.     Extraocular Movements: Extraocular movements intact.     Conjunctiva/sclera: Conjunctivae normal.     Pupils: Pupils are equal, round, and reactive to light.  Neck:     Vascular: No carotid bruit.  Cardiovascular:     Rate and Rhythm: Normal rate and regular rhythm.     Pulses: Normal pulses.     Heart sounds: Normal heart sounds. No murmur. No friction rub. No gallop.   Pulmonary:     Effort: Pulmonary effort is normal. No respiratory distress.     Breath sounds: Normal breath sounds. No wheezing, rhonchi or rales.  Chest:     Chest wall: No tenderness.  Abdominal:     General: Bowel sounds are normal. There is no distension.     Palpations: Abdomen is soft. There is no mass.     Tenderness: There is no abdominal tenderness. There is no right CVA tenderness, left CVA tenderness, guarding or rebound.  Musculoskeletal:        General: No swelling or tenderness. Normal range of motion.     Cervical back: Normal range of motion. No rigidity or tenderness.     Right lower leg: No edema.     Left lower leg: No edema.  Lymphadenopathy:     Cervical: No cervical adenopathy.  Skin:    General: Skin is warm and dry.     Coloration: Skin is not pale.     Findings: No bruising, erythema or rash.  Neurological:     Mental Status: She is alert and oriented to person, place, and time.     Cranial Nerves: No cranial nerve deficit.     Sensory: No sensory deficit.     Motor: No weakness.     Coordination: Coordination normal.     Gait: Gait normal.  Psychiatric:        Mood and Affect: Mood normal.        Behavior: Behavior normal.        Thought Content: Thought content normal.        Judgment: Judgment normal.    Labs reviewed: Recent Labs  03/21/19 0812  08/15/19 0827  NA 142 140  K 4.0 3.9  CL 106 106  CO2 29 27  GLUCOSE 94 85  BUN 15 14  CREATININE 0.78 0.85  CALCIUM 9.1 9.5   Recent Labs    03/21/19 0812 08/15/19 0827  AST 15 14  ALT 10 7  BILITOT 0.5 0.7  PROT 6.6 7.0   Recent Labs    03/21/19 0812 08/15/19 0827  WBC 5.4 CANCELED  NEUTROABS 2,209  --   HGB 12.8  --   HCT 39.7  --   MCV 92.8  --   PLT 324  --    Lab Results  Component Value Date   TSH 2.86 08/15/2019   No results found for: HGBA1C Lab Results  Component Value Date   CHOL 213 (H) 08/15/2019   HDL 79 08/15/2019   LDLCALC 110 (H) 08/15/2019   TRIG 128 08/15/2019   CHOLHDL 2.7 08/15/2019    Significant Diagnostic Results in last 30 days:  No results found.  Assessment/Plan 1. Essential hypertension B/p at goal.continue on Amlodipine 5 mg tablet daily  - CBC with Differential/Platelet; Future - CMP with eGFR([facility]); Future - TSH; Future - CBC with Differential/Platelets  2. Mixed hyperlipidemia LDL not at goal.continue on atorvastatin 10 mg tablet daily. - Lipid panel; Future  3. Atrial fibrillation, unspecified type ([place]) Continue on edoxaban 30 mg tablet daily and Amiodrone 100 mg tablet daily. - continue to follow up with Cardiologist as directed.  - CBC with Differential/Platelet; Future  4. Age-related osteoporosis without current pathological fracture Continue on Calcium and vit D.continue with exercise.  - DG Bone Density; Future - VITAMIN D 25 Hydroxy (Vit-D Deficiency, Fractures); Future - denosumab (PROLIA) injection 60 mg injection administered by CMA today due next in 6 months.   5. Seasonal allergies Continue on cetirizine 10 mg tablet daily   6. Need for Tdap vaccination Tdap vaccine script send to pharmacy.Advised to get tdap at least 4 weeks after COVID-19 vaccine to prevent interaction.  - Tdap ([place]) 5-2.5-18.5 LF-MCG/0.5 injection; Inject 0.5 mLs into the muscle once for 1 dose.  Dispense: 0.5 mL;  Refill: 0  Family/ staff Communication: Reviewed plan of care with patient and daughter Bethena Roys   Labs/tests ordered:  - CBC with Differential/Platelet; Future - CMP with eGFR([facility]); Future - TSH; Future - CBC with Differential/Platelets - Lipid panel; Future - DG Bone Density; Future  Next Appointment : 6 months for medical management of chronic issues and for Prolia injection.Labs 2-4 days prior to visit.   Sandrea Hughs, NP

## 2019-08-22 NOTE — Patient Instructions (Signed)
Continue with dietary modification and exercise

## 2019-08-23 ENCOUNTER — Encounter: Payer: Self-pay | Admitting: *Deleted

## 2019-08-30 ENCOUNTER — Ambulatory Visit: Payer: BC Managed Care – PPO | Attending: Internal Medicine

## 2019-08-30 ENCOUNTER — Ambulatory Visit: Payer: BC Managed Care – PPO

## 2019-08-30 DIAGNOSIS — Z23 Encounter for immunization: Secondary | ICD-10-CM

## 2019-08-30 NOTE — Progress Notes (Signed)
   Covid-19 Vaccination Clinic  Name:  Tammy Walker    MRN: [id number] DOB: [birth year]  08/30/2019  Tammy Walker was observed post Covid-19 immunization for 15 minutes without incident. She was provided with Vaccine Information Sheet and instruction to access the V-Safe system.   Tammy Walker was instructed to call 911 with any severe reactions post vaccine: [name] Difficulty breathing  . Swelling of face and throat  . A fast heartbeat  . A bad rash all over body  . Dizziness and weakness   Immunizations Administered    Name Date Dose VIS Date Route   Pfizer COVID-19 Vaccine 08/30/2019  3:54 PM 0.3 mL 06/10/2019 Intramuscular   Manufacturer: [employer], [employer]   Lot: [id number]   NDC: [address]-9

## 2019-09-07 ENCOUNTER — Other Ambulatory Visit: Payer: Self-pay

## 2019-09-07 DIAGNOSIS — M545 Low back pain, unspecified: Secondary | ICD-10-CM

## 2019-09-07 DIAGNOSIS — G8929 Other chronic pain: Secondary | ICD-10-CM

## 2019-09-07 MED ORDER — HYDROCODONE BITARTRATE ER 10 MG PO CP12: 1.0000 | ORAL_CAPSULE | Freq: Two times a day (BID) | ORAL | 0 refills | Status: DC

## 2019-09-23 ENCOUNTER — Telehealth: Payer: Self-pay | Admitting: *Deleted

## 2019-09-23 NOTE — Telephone Encounter (Signed)
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  Dr Anne Fu reviewed and advised pt to discontinue Amiodarone.  Spoke with daughter Darel Hong [facility]) who is aware her pt to d/c amiodarone.  She will call if any issues after stopping it and pt will f/u as scheduled in May as scheduled.

## 2019-09-28 DIAGNOSIS — I4891 Unspecified atrial fibrillation: Secondary | ICD-10-CM | POA: Diagnosis not present

## 2019-09-28 DIAGNOSIS — D6869 Other thrombophilia: Secondary | ICD-10-CM | POA: Diagnosis not present

## 2019-09-28 DIAGNOSIS — H903 Sensorineural hearing loss, bilateral: Secondary | ICD-10-CM | POA: Diagnosis not present

## 2019-09-28 DIAGNOSIS — E7800 Pure hypercholesterolemia, unspecified: Secondary | ICD-10-CM | POA: Diagnosis not present

## 2019-09-28 DIAGNOSIS — I10 Essential (primary) hypertension: Secondary | ICD-10-CM | POA: Diagnosis not present

## 2019-09-28 DIAGNOSIS — H6123 Impacted cerumen, bilateral: Secondary | ICD-10-CM | POA: Diagnosis not present

## 2019-10-10 ENCOUNTER — Other Ambulatory Visit: Payer: Self-pay | Admitting: Family

## 2019-10-10 NOTE — Telephone Encounter (Signed)
Not familiar with this medication, ok to fill?

## 2019-10-26 DIAGNOSIS — R112 Nausea with vomiting, unspecified: Secondary | ICD-10-CM | POA: Diagnosis not present

## 2019-11-21 ENCOUNTER — Encounter: Payer: Self-pay | Admitting: Cardiology

## 2019-11-21 ENCOUNTER — Ambulatory Visit: Payer: BC Managed Care – PPO | Admitting: Cardiology

## 2019-11-29 DIAGNOSIS — R1084 Generalized abdominal pain: Secondary | ICD-10-CM | POA: Diagnosis not present

## 2019-12-02 ENCOUNTER — Ambulatory Visit: Payer: BC Managed Care – PPO | Admitting: Cardiology

## 2019-12-02 DIAGNOSIS — R1084 Generalized abdominal pain: Secondary | ICD-10-CM | POA: Diagnosis not present

## 2019-12-02 DIAGNOSIS — N281 Cyst of kidney, acquired: Secondary | ICD-10-CM | POA: Diagnosis not present

## 2019-12-29 DIAGNOSIS — I4891 Unspecified atrial fibrillation: Secondary | ICD-10-CM | POA: Diagnosis not present

## 2019-12-29 DIAGNOSIS — G894 Chronic pain syndrome: Secondary | ICD-10-CM | POA: Diagnosis not present

## 2019-12-29 DIAGNOSIS — I10 Essential (primary) hypertension: Secondary | ICD-10-CM | POA: Diagnosis not present

## 2019-12-29 DIAGNOSIS — E7800 Pure hypercholesterolemia, unspecified: Secondary | ICD-10-CM | POA: Diagnosis not present

## 2020-01-26 ENCOUNTER — Encounter: Payer: Self-pay | Admitting: Cardiology

## 2020-01-26 ENCOUNTER — Other Ambulatory Visit: Payer: Self-pay

## 2020-01-26 ENCOUNTER — Ambulatory Visit (INDEPENDENT_AMBULATORY_CARE_PROVIDER_SITE_OTHER): Payer: Medicare Other | Admitting: Cardiology

## 2020-01-26 VITALS — BP 120/70 | HR 80 | Ht 63.0 in | Wt 129.0 lb

## 2020-01-26 DIAGNOSIS — Z7901 Long term (current) use of anticoagulants: Secondary | ICD-10-CM | POA: Diagnosis not present

## 2020-01-26 DIAGNOSIS — I447 Left bundle-branch block, unspecified: Secondary | ICD-10-CM | POA: Diagnosis not present

## 2020-01-26 DIAGNOSIS — I480 Paroxysmal atrial fibrillation: Secondary | ICD-10-CM | POA: Diagnosis not present

## 2020-01-26 NOTE — Progress Notes (Signed)
Cardiology Office Note:    Date:  01/26/2020   ID:  Tammy Walker, DOB [birth year], MRN [id number]  PCP:  Tammy LundBecker, Anna G, PA  Cardiologist:  Tammy SchultzMark Keddrick Wyne, MD  Electrophysiologist:  None   Referring MD: Caesar BookmanNgetich, Dinah C, NP     History of Present Illness:    Tammy SheffieldMaria J Walker is a 86 y.o. female here to evaluate atrial fibrillation.  Recently moved from [place] after her husband died.  She grew up in [place].  She is a retired OptometristE teacher and Conservation officer, natureteacher's aide.  In regards to atrial fibrillation, she has been taking amiodarone 200 mg a day and edoxaban 30 mg a day.  Denies any chest pain palpitations.  Echocardiogram has been performed and shows EF of 60% with mild aortic stenosis and mild regurgitation with dilated ascending aorta.  She has mild to moderate mitral regurgitation as well.  She does have chronic mid back pain.  AFIB 2002. On AMIO since then.  She required a cardioversion back then.  Has not had any recurrent episodes.  Does not feel any palpitations.  Occasionally she will get some substernal chest discomfort at the end of the day where a hot water bottle or ibuprofen gel will help.  This has been going on for years.  She and her husband have walked a marathon back in her 5170s.  She is now living with her daughter.  01/26/2020-here for follow-up of atrial fibrillation.  Has been on amiodarone and edoxaban 30 mg a day.  Had cardioversion back in 2002 no recurrent symptoms.  Denies any fevers chills nausea vomiting syncope bleeding.  We had to stop her amiodarone in March 2021 because of a high changes seen by ophthalmology.  Overall doing well.  Past Medical History:  Diagnosis Date  . Allergy   . Arrhythmia   . Atrial fibrillation (HCC)   . GERD (gastroesophageal reflux disease)   . History of bone scan    Bone Density Scans 2017 & 2019 Dr. Dutch QuintPoole   . History of chest x-ray    Apart from large hiatus hernia, normal heart, lungs and mediastinum. [facility]    . History of CT scan of abdomen 10/24/2017   Gallstones within a thin-waled gallbladder, [facility]   . History of echocardiogram    [facility]   . History of EKG    Sinus rhythm, borderline 1st deg block, LAD completely changed axis, LBBB(old). [facility]   . Hyperlipidemia   . Hypertension   . Insomnia   . Left lower lobe pneumonia 09/20/2015   [facility]   . Nodule of right lung    [facility]   . Osteoporosis    on fosamax  . Reduced mobility     Past Surgical History:  Procedure Laterality Date  . APPENDECTOMY  [address]   [place], [place]   . CT SCAN     2017, 2018, 2019 -- re lungs see records   . [facility]UB[place] [facility]  [place]   [facility] [place]     Current Medications: Current Meds  Medication Sig  . amLODipine (NORVASC) 5 MG tablet Take 1 tablet (5 mg total) by mouth daily.  [name]. atorvastatin (LIPITOR) 10 MG tablet Take 1 tablet (10 mg total) by mouth daily.  . bisacodyl (DULCOLAX) 5 MG EC tablet Take 10 mg by mouth  once.   . calcium carbonate 1250 MG capsule Take 1,250 mg by mouth 2 (two) times daily with a meal.  . calcium-vitamin D (OSCAL WITH D) 500-200 MG-UNIT tablet Take 1 tablet by mouth 2 (two) times daily.  . cetirizine (ZYRTEC) 10 MG tablet Take 10 mg by mouth daily.  [name] denosumab (PROLIA) 60 MG/ML SOSY injection Inject 60 mg into the skin every 6 (six) months. Last injection in 01/2019  . HYDROcodone Bitartrate ER 10 MG CP12 Take 1 capsule by mouth every 12 (twelve) hours.  [name] HYDROcodone Bitartrate ER 10 MG CP12 Take 1 capsule by mouth every 12 (twelve) hours.  . pregabalin (LYRICA) 25 MG capsule Patient takes by mouth  1 (25 mg ) in am, 2 (50 mg ) in afternoon, 1 (25mg )  in pm  . SAVAYSA 30 MG TABS tablet TAKE 1 TABLET(30 MG) BY MOUTH DAILY  . shark liver oil-cocoa butter  (PREPARATION H) 0.25-3-85.5 % suppository Place 1 suppository rectally as needed for hemorrhoids.     Allergies:   Patient has no known allergies.   Social History   Socioeconomic History  . Marital status: Widowed    Spouse name: Not on file  . Number of children: Not on file  . Years of education: Not on file  . Highest education level: Not on file  Occupational History  . Not on file  Tobacco Use  . Smoking status: Never Smoker  . Smokeless tobacco: Never Used  Vaping Use  . Vaping Use: Never used  Substance and Sexual Activity  . Alcohol use: Yes    Comment: less than one drink a week  . Drug use: No  . Sexual activity: Never    Birth control/protection: None  Other Topics Concern  . Not on file  Social History Narrative   Social History      Diet? I watch my intake of salt, sugar, and fat      Do you drink/eat things with caffeine? Yes       Marital status?      Widowed                               What year were you married? 1960      Do you live in a house, apartment, assisted living, condo, trailer, etc.? House       Is it one or more stories? one      How many persons live in your home? 2      Do you have any pets in your home? (please list) yes 2 cats       Highest level of education completed? 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             yes                         Type & how often? Strength & Balance       Advanced Directives      Do you have a living will?  No       Do you have a DNR form?        No  If not, do you want to discuss one? No       Do you have signed POA/HPOA for forms? Yes       Functional Status      Do you have difficulty bathing or dressing yourself? No       Do you have difficulty preparing food or eating? No       Do you have difficulty managing your medications? No       Do you have difficulty  managing your finances? No       Do you have difficulty affording your medications? No       Social Determinants of Health   Financial Resource Strain:   . Difficulty of Paying Living Expenses:   Food Insecurity:   . Worried About Programme researcher, broadcasting/film/video in the Last Year:   . Barista in the Last Year:   Transportation Needs:   . Freight forwarder (Medical):   [name] Lack of Transportation (Non-Medical):   Physical Activity:   . Days of Exercise per Week:   . Minutes of Exercise per Session:   Stress:   . Feeling of Stress :   Social Connections:   . Frequency of Communication with Friends and Family:   . Frequency of Social Gatherings with Friends and Family:   . Attends Religious Services:   . Active Member of Clubs or Organizations:   . Attends Banker Meetings:   [name] Marital Status:      Family History: The patient's family history includes COPD in her mother; Heart disease in her brother, father, and mother; Heart failure in her brother and mother; High blood pressure in her mother; Hyperlipidemia in her daughter; Hypertension in her daughter.  ROS:   Please see the history of present illness.     All other systems reviewed and are negative.  EKGs/Labs/Other Studies Reviewed:    The following studies were reviewed today: Normal EF mild aortic stenosis  EKG:  05/23/2019-sinus rhythm first-degree AV block 230 ms left bundle branch block personally reviewed  Recent Labs: 08/15/2019: ALT 7; BUN 14; Creat 0.85; Potassium 3.9; Sodium 140; TSH 2.86 08/22/2019: Hemoglobin 13.2; Platelets 310  Recent Lipid Panel    Component Value Date/Time   CHOL 213 (H) 08/15/2019 0827   TRIG 128 08/15/2019 0827   HDL 79 08/15/2019 0827   CHOLHDL 2.7 08/15/2019 0827   LDLCALC 110 (H) 08/15/2019 0827    Physical Exam:    VS:  BP 120/70   Pulse 80   Ht 5\' 3"  (1.6 m)   Wt 129 lb (58.5 kg)   SpO2 95%   BMI 22.85 kg/m     Wt Readings from Last 3 Encounters:   01/26/20 129 lb (58.5 kg)  08/22/19 131 lb (59.4 kg)  05/23/19 134 lb (60.8 kg)     GEN: Well nourished, well developed, in no acute distress  HEENT: Marked kyphosis Neck: no JVD, carotid bruits, or masses Cardiac: RRR; 2/6 SM, no rubs, or gallops,no edema  Respiratory:  clear to auscultation bilaterally, normal work of breathing GI: soft, nontender, nondistended, + BS MS: no deformity or atrophy  Skin: warm and dry, no rash Neuro:  Alert and Oriented x 3, Strength and sensation are intact Psych: euthymic mood, full affect   ASSESSMENT:    1. Paroxysmal atrial fibrillation (HCC)   2. Left bundle branch block   3. Chronic anticoagulation    PLAN:    In order of problems  listed above:  Atrial fibrillation paroxysmal -By history only had one episode in 2002, she was placed on amiodarone then and cardioverted.  Took a few shocks to be successful she states. -We have stopped her amiodarone in March 2021 because of eye deposits noted by ophthalmology.  She is doing well without it.  Mild to moderate mitral regurgitation.  She is on edoxaban for anticoagulation.  Hemoglobin 13.2 creatinine 0.7.  LDL 112.    Chronic anticoagulation -Currently taking edoxaban 30 mg a day.  She was transitioned to this from warfarin in [place] when Covid started.  No changes made.  Continue to monitor for bleeding.  Lab work reviewed.  Left bundle branch block -Seen on ECG strip on 02/13/2018.  Was on amiodarone, but this was stopped.  Increased risk for pacemaker needs in the future.  TSH 2.86 last hemoglobin 13.2  Mild aortic stenosis -We will continue to monitor clinically.  Infrequent echocardiogram.  Here with her daughter  6 months follow-up, she knows to contact us if atrial fibrillation is sensed  Medication Adjustments/Labs and Tests Ordered: Current medicines are reviewed at length with the patient today.  Concerns regarding medicines are outlined above.  No orders of the defined types  were placed in this encounter.  No orders of the defined types were placed in this encounter.   Patient Instructions  Medication Instructions:  The current medical regimen is effective;  continue present plan and medications.  *If you need a refill on your cardiac medications before your next appointment, please call your pharmacy*  Follow-Up: At [facility], you and your health needs are our priority.  As part of our continuing mission to provide you with exceptional heart care, we have created designated Provider Care Teams.  These Care Teams include your primary Cardiologist (physician) and Advanced Practice Providers (APPs -  Physician Assistants and Nurse Practitioners) who all work together to provide you with the care you need, when you need it.  Your next appointment:   6 month(s)  The format for your next appointment:   In Person  Provider:   Donato Schultz, MD   Thank you for choosing [facility]!!        Signed, Tammy Schultz, MD  01/26/2020 11:07 AM    [facility]

## 2020-01-26 NOTE — Patient Instructions (Addendum)
Medication Instructions:  The current medical regimen is effective;  continue present plan and medications.  *If you need a refill on your cardiac medications before your next appointment, please call your pharmacy*  Follow-Up: At [facility], you and your health needs are our priority.  As part of our continuing mission to provide you with exceptional heart care, we have created designated Provider Care Teams.  These Care Teams include your primary Cardiologist (physician) and Advanced Practice Providers (APPs -  Physician Assistants and Nurse Practitioners) who all work together to provide you with the care you need, when you need it.  Your next appointment:   6 month(s)  The format for your next appointment:   In Person  Provider:   Mark Skains, MD  Thank you for choosing [facility]!!     

## 2020-01-31 ENCOUNTER — Other Ambulatory Visit: Payer: Self-pay | Admitting: Family

## 2020-02-03 ENCOUNTER — Telehealth: Payer: Self-pay | Admitting: Cardiology

## 2020-02-03 MED ORDER — EDOXABAN TOSYLATE 30 MG PO TABS: 30.0000 mg | ORAL_TABLET | Freq: Every day | ORAL | 1 refills | Status: DC

## 2020-02-03 NOTE — Telephone Encounter (Signed)
Savaysa 30 mg refill requested.    Age 86 Wt 129# Scr 0.85 CrCl 43 ml/min  Dose appropriate per renal function

## 2020-02-03 NOTE — Telephone Encounter (Signed)
*  STAT* If patient is at the pharmacy, call can be transferred to refill team.   1. Which medications need to be refilled? (please list name of each medication and dose if known)   SAVAYSA 30 MG TABS tablet    2. Which pharmacy/location (including street and city if local pharmacy) is medication to be sent to? [facility] [phone] - [place], [place] - [address]  3. Do they need a 30 day or 90 day supply? 90 day   Patient's daughter states she no longer sees [facility] and would like Dr. Anne Fu to refill the prescription.

## 2020-02-06 DIAGNOSIS — E559 Vitamin D deficiency, unspecified: Secondary | ICD-10-CM | POA: Diagnosis not present

## 2020-02-06 DIAGNOSIS — E7800 Pure hypercholesterolemia, unspecified: Secondary | ICD-10-CM | POA: Diagnosis not present

## 2020-02-20 ENCOUNTER — Other Ambulatory Visit: Payer: Medicare Other

## 2020-02-22 ENCOUNTER — Ambulatory Visit: Payer: Medicare Other | Admitting: Family

## 2020-02-22 DIAGNOSIS — M810 Age-related osteoporosis without current pathological fracture: Secondary | ICD-10-CM | POA: Diagnosis not present

## 2020-02-24 ENCOUNTER — Ambulatory Visit: Payer: Medicare Other | Admitting: Family

## 2020-03-16 DIAGNOSIS — Z20822 Contact with and (suspected) exposure to covid-19: Secondary | ICD-10-CM | POA: Diagnosis not present

## 2020-05-31 DIAGNOSIS — I4891 Unspecified atrial fibrillation: Secondary | ICD-10-CM | POA: Diagnosis not present

## 2020-05-31 DIAGNOSIS — E7800 Pure hypercholesterolemia, unspecified: Secondary | ICD-10-CM | POA: Diagnosis not present

## 2020-05-31 DIAGNOSIS — G894 Chronic pain syndrome: Secondary | ICD-10-CM | POA: Diagnosis not present

## 2020-05-31 DIAGNOSIS — I10 Essential (primary) hypertension: Secondary | ICD-10-CM | POA: Diagnosis not present

## 2020-07-25 ENCOUNTER — Other Ambulatory Visit: Payer: Self-pay

## 2020-07-25 ENCOUNTER — Telehealth (INDEPENDENT_AMBULATORY_CARE_PROVIDER_SITE_OTHER): Payer: Medicare Other | Admitting: Cardiology

## 2020-07-25 VITALS — BP 101/62 | HR 68 | Ht 61.0 in | Wt 129.0 lb

## 2020-07-25 DIAGNOSIS — Z7901 Long term (current) use of anticoagulants: Secondary | ICD-10-CM

## 2020-07-25 DIAGNOSIS — I700 Atherosclerosis of aorta: Secondary | ICD-10-CM | POA: Diagnosis not present

## 2020-07-25 DIAGNOSIS — I447 Left bundle-branch block, unspecified: Secondary | ICD-10-CM

## 2020-07-25 DIAGNOSIS — I480 Paroxysmal atrial fibrillation: Secondary | ICD-10-CM

## 2020-07-25 MED ORDER — EDOXABAN TOSYLATE 30 MG PO TABS: 30.0000 mg | ORAL_TABLET | Freq: Every day | ORAL | 3 refills | Status: DC

## 2020-07-25 NOTE — Progress Notes (Signed)
Virtual Visit via Video Note   This visit type was conducted due to national recommendations for restrictions regarding the COVID-19 Pandemic (e.g. social distancing) in an effort to limit this patient's exposure and mitigate transmission in our community.  Due to her co-morbid illnesses, this patient is at least at moderate risk for complications without adequate follow up.  This format is felt to be most appropriate for this patient at this time.  All issues noted in this document were discussed and addressed.  A limited physical exam was performed with this format.  Please refer to the patient's chart for her consent to telehealth for [facility].       Date:  07/25/2020   ID:  Tammy Walker, DOB Nov 12, 1930, MRN [id number] The patient was identified using 2 identifiers.  Patient Location: Home Provider Location: Home Office  PCP:  Wilfrid Lund, PA  Cardiologist:  Donato Schultz, MD  Electrophysiologist:  None   Evaluation Performed:  Follow-Up Visit    History of Present Illness:    Tammy Walker is a 87 y.o. female here for the follow-up atrial fibrillation.  Recently moved from [place] after her husband died.  She grew up in [place].  She is retired Furniture conservator/restorer and Conservation officer, nature.  She now resides here with her daughter.  She was taking amiodarone 200 mg a day for several years as well as edoxaban 30 mg a day for anticoagulation.  We stopped her amiodarone in March or 2021 because of eye changes seen by ophthalmology.  Echocardiogram showed EF of 60% with mild aortic stenosis and mild aortic regurgitation with dilated ascending aorta.  Mild to moderate mitral regurgitation.  Her atrial fibrillation was discovered in 2002.  She has been on amiodarone since then.  She had required a cardioversion earlier on.  Rarely will get substernal chest discomfort at the end of the day.  She will use a hot water bottle or ibuprofen gel to help.  This has been going on for  years.  She and her husband had walked a marathon back in her 3s.  The patient does not have symptoms concerning for COVID-19 infection (fever, chills, cough, or new shortness of breath).    Past Medical History:  Diagnosis Date  . Allergy   . Arrhythmia   . Atrial fibrillation (HCC)   . GERD (gastroesophageal reflux disease)   . History of bone scan    Bone Density Scans 2017 & 2019 Dr. Dutch Quint   . History of chest x-ray    Apart from large hiatus hernia, normal heart, lungs and mediastinum. [facility]    . History of CT scan of abdomen 10/24/2017   Gallstones within a thin-waled gallbladder, Per [facility]   . [facility] of echocardiogram    [facility]   . History of EKG    Sinus rhythm, borderline 1st deg block, LAD completely changed axis, LBBB(old). [facility]   . Hyperlipidemia   . Hypertension   . Insomnia   . Left lower lobe pneumonia 09/20/2015   [facility]   . Nodule of right lung    [facility],   . Osteoporosis    on fosamax  . Reduced mobility    Past Surgical History:  Procedure Laterality Date  . APPENDECTOMY  [address], [place]   . CT  SCAN     2017, 2018, 2019 -- re lungs see records   . TUBAL LIGATION  1978   [facility] [place], [place]      Current Meds  Medication Sig  . acetaminophen (TYLENOL) 500 MG tablet Take 500 mg by mouth every 4 (four) hours.  [name] amLODipine (NORVASC) 5 MG tablet Take 1 tablet (5 mg total) by mouth daily.  [name] atorvastatin (LIPITOR) 20 MG tablet Take 20 mg by mouth daily.  . bisacodyl (DULCOLAX) 5 MG EC tablet Take 10 mg by mouth daily. Two tablets (10 mg) daily  . calcium carbonate 1250 MG capsule Take 1,250 mg by mouth 2 (two) times daily with a meal.  . calcium-vitamin D (OSCAL WITH D) 500-200 MG-UNIT tablet Take 1 tablet by mouth 2  (two) times daily.  . cetirizine (ZYRTEC) 10 MG tablet Take 10 mg by mouth daily.  [name] denosumab (PROLIA) 60 MG/ML SOSY injection Inject 60 mg into the skin every 6 (six) months. Last injection in 01/2019  . diclofenac Sodium (VOLTAREN) 1 % GEL Apply 4 g topically as needed (back pain).  [name] HYDROcodone Bitartrate ER 15 MG CP12 Take 1 capsule by mouth every 12 (twelve) hours.  [name] oxyCODONE (ROXICODONE) 5 MG/5ML solution Take 2.5 mg by mouth every 4 (four) hours as needed for breakthrough pain.  . polyethylene glycol (MIRALAX / GLYCOLAX) 17 g packet Take 17 g by mouth daily.  . pregabalin (LYRICA) 25 MG capsule Take 25 mg by mouth 3 (three) times daily.  . shark liver oil-cocoa butter (PREPARATION H) 0.25-3-85.5 % suppository Place 1 suppository rectally as needed for hemorrhoids.  . [DISCONTINUED] edoxaban (SAVAYSA) 30 MG TABS tablet Take 1 tablet (30 mg total) by mouth daily.  . [DISCONTINUED] HYDROcodone Bitartrate ER 10 MG CP12 Take 1 capsule by mouth every 12 (twelve) hours.  . [DISCONTINUED] pregabalin (LYRICA) 25 MG capsule Patient takes by mouth  1 (25 mg ) in am, 2 (50 mg ) in afternoon, 1 (25mg )  in pm     Allergies:   Patient has no known allergies.   Social History   Tobacco Use  . Smoking status: Never Smoker  . Smokeless tobacco: Never Used  Vaping Use  . Vaping Use: Never used  Substance Use Topics  . Alcohol use: Yes    Comment: less than one drink a week  . Drug use: No     Family Hx: The patient's family history includes COPD in her mother; Heart disease in her brother, father, and mother; Heart failure in her brother and mother; High blood pressure in her mother; Hyperlipidemia in her daughter; Hypertension in her daughter.  ROS:   Please see the history of present illness.     All other systems reviewed and are negative.   Prior CV studies:   The following studies were reviewed today:  Echocardiogram as described above showed normal EF mild aortic stenosis mild  to moderate mitral regurgitation  Labs/Other Tests and Data Reviewed:    EKG:   EKG from 05/23/2019 shows sinus rhythm first-degree AV block 230 ms with left bundle branch block.  Recent Labs: 08/15/2019: ALT 7; BUN 14; Creat 0.85; Potassium 3.9; Sodium 140; TSH 2.86 08/22/2019: Hemoglobin 13.2; Platelets 310   Recent Lipid Panel Lab Results  Component Value Date/Time   CHOL 213 (H) 08/15/2019 08:27 AM   TRIG 128 08/15/2019 08:27 AM   HDL 79 08/15/2019 08:27 AM   CHOLHDL 2.7 08/15/2019 08:27 AM   LDLCALC 110 (H) 08/15/2019  08:27 AM    Wt Readings from Last 3 Encounters:  07/25/20 129 lb (58.5 kg)  01/26/20 129 lb (58.5 kg)  08/22/19 131 lb (59.4 kg)     Risk Assessment/Calculations:      Objective:    Vital Signs:  BP 101/62   Pulse 68   Ht 5\' 1"  (1.549 m)   Wt 129 lb (58.5 kg)   BMI 24.37 kg/m    VITAL SIGNS:  reviewed GEN:  no acute distress EYES:  sclerae anicteric, EOMI - Extraocular Movements Intact RESPIRATORY:  normal respiratory effort, symmetric expansion SKIN:  no rash, lesions or ulcers. MUSCULOSKELETAL:  no obvious deformities. NEURO:  alert and oriented x 3, no obvious focal deficit PSYCH:  normal affect  ASSESSMENT & PLAN:    Paroxysmal atrial fibrillation -By historical account, had only 1 episode in 2002.  She was placed on amiodarone and cardioverted.  She remembers it taking a few shocks to be successful.  Since then, she has been maintaining sinus rhythm. -In March or 2021 we stopped her amiodarone because of high deposition noted by ophthalmologist.  She is doing well without it.  Chronic anticoagulation -On edoxaban 30 mg a day.  This was started in 02-24-2004. -Hemoglobin and creatinine monitored by lab work.  No bleeding.  Left bundle branch block -EKG demonstrates this.  No longer on amiodarone.  TSH previously 2.86, hemoglobin 13.3.  May require pacemaker in the future if conduction system deteriorates.  Mild aortic stenosis -Continue  to monitor clinically.  Likely repeat echocardiogram in 1 year.  Aortic atherosclerosis -Seen on CT scan 2014 interpretation noted.  Abdominal aorta.  Continue with good blood pressure control.        COVID-19 Education:he signs and symptoms of COVID-19 were discussed with the patient and how to seek care for testing (follow up with PCP or arrange E-visit).  he importance of social distancing was discussed today.  Time:   Today, I have spent 21 minutes with the patient with telehealth technology discussing the above problems.     Medication Adjustments/Labs and Tests Ordered: Current medicines are reviewed at length with the patient today.  Concerns regarding medicines are outlined above.   Tests Ordered: Orders Placed This Encounter  Procedures  . ECHOCARDIOGRAM COMPLETE    Medication Changes: Meds ordered this encounter  Medications  . edoxaban (SAVAYSA) 30 MG TABS tablet    Sig: Take 1 tablet (30 mg total) by mouth daily.    Dispense:  90 tablet    Refill:  3    Follow Up:  In Person  2015 2022)  Signed, 2023, MD  07/25/2020 2:40 PM     [facility]

## 2020-07-25 NOTE — Patient Instructions (Signed)
Medication Instructions:  Your physician recommends that you continue on your current medications as directed. Please refer to the Current Medication list given to you today.  *If you need a refill on your cardiac medications before your next appointment, please call your pharmacy*   Lab Work: NONE    Testing/Procedures: Your physician has requested that you have an echocardiogram in November 2022.  Echocardiography is a painless test that uses sound waves to create images of your heart. It provides your doctor with information about the size and shape of your heart and how well your heart's chambers and valves are working. This procedure takes approximately one hour. There are no restrictions for this procedure.     Follow-Up: At [facility], you and your health needs are our priority.  As part of our continuing mission to provide you with exceptional heart care, we have created designated Provider Care Teams.  These Care Teams include your primary Cardiologist (physician) and Advanced Practice Providers (APPs -  Physician Assistants and Nurse Practitioners) who all work together to provide you with the care you need, when you need it.  We recommend signing up for the patient portal called "MyChart".  Sign up information is provided on this After Visit Summary.  MyChart is used to connect with patients for Virtual Visits (Telemedicine).  Patients are able to view lab/test results, encounter notes, upcoming appointments, etc.  Non-urgent messages can be sent to your provider as well.   To learn more about what you can do with MyChart, go to [email or web address].    Your next appointment:   10 month(s)  The format for your next appointment:   In Person  Provider:   You may see Donato Schultz, MD or one of the following Advanced Practice Providers on your designated Care Team:    Georgie Chard, NP

## 2020-09-23 ENCOUNTER — Emergency Department (HOSPITAL_BASED_OUTPATIENT_CLINIC_OR_DEPARTMENT_OTHER): Payer: Medicare Other

## 2020-09-23 ENCOUNTER — Encounter (HOSPITAL_BASED_OUTPATIENT_CLINIC_OR_DEPARTMENT_OTHER): Payer: Self-pay

## 2020-09-23 ENCOUNTER — Inpatient Hospital Stay (HOSPITAL_BASED_OUTPATIENT_CLINIC_OR_DEPARTMENT_OTHER)
Admission: EM | Admit: 2020-09-23 | Discharge: 2020-09-27 | DRG: 309 | Disposition: A | Discharge status: Home / Self Care | Payer: Medicare Other | Attending: Internal Medicine | Admitting: Internal Medicine

## 2020-09-23 ENCOUNTER — Other Ambulatory Visit: Payer: Self-pay

## 2020-09-23 DIAGNOSIS — T462X5A Adverse effect of other antidysrhythmic drugs, initial encounter: Secondary | ICD-10-CM | POA: Diagnosis present

## 2020-09-23 DIAGNOSIS — Z20822 Contact with and (suspected) exposure to covid-19: Secondary | ICD-10-CM | POA: Diagnosis present

## 2020-09-23 DIAGNOSIS — H9190 Unspecified hearing loss, unspecified ear: Secondary | ICD-10-CM | POA: Diagnosis present

## 2020-09-23 DIAGNOSIS — E785 Hyperlipidemia, unspecified: Secondary | ICD-10-CM | POA: Diagnosis present

## 2020-09-23 DIAGNOSIS — I447 Left bundle-branch block, unspecified: Secondary | ICD-10-CM | POA: Diagnosis present

## 2020-09-23 DIAGNOSIS — K219 Gastro-esophageal reflux disease without esophagitis: Secondary | ICD-10-CM | POA: Diagnosis present

## 2020-09-23 DIAGNOSIS — I700 Atherosclerosis of aorta: Secondary | ICD-10-CM | POA: Diagnosis present

## 2020-09-23 DIAGNOSIS — H18003 Unspecified corneal deposit, bilateral: Secondary | ICD-10-CM | POA: Diagnosis present

## 2020-09-23 DIAGNOSIS — Z7902 Long term (current) use of antithrombotics/antiplatelets: Secondary | ICD-10-CM

## 2020-09-23 DIAGNOSIS — I509 Heart failure, unspecified: Secondary | ICD-10-CM | POA: Diagnosis not present

## 2020-09-23 DIAGNOSIS — I10 Essential (primary) hypertension: Secondary | ICD-10-CM | POA: Diagnosis present

## 2020-09-23 DIAGNOSIS — R072 Precordial pain: Secondary | ICD-10-CM | POA: Diagnosis present

## 2020-09-23 DIAGNOSIS — I361 Nonrheumatic tricuspid (valve) insufficiency: Secondary | ICD-10-CM | POA: Diagnosis not present

## 2020-09-23 DIAGNOSIS — J90 Pleural effusion, not elsewhere classified: Secondary | ICD-10-CM | POA: Diagnosis present

## 2020-09-23 DIAGNOSIS — Z8249 Family history of ischemic heart disease and other diseases of the circulatory system: Secondary | ICD-10-CM | POA: Diagnosis not present

## 2020-09-23 DIAGNOSIS — Z79899 Other long term (current) drug therapy: Secondary | ICD-10-CM | POA: Diagnosis not present

## 2020-09-23 DIAGNOSIS — I480 Paroxysmal atrial fibrillation: Secondary | ICD-10-CM | POA: Diagnosis present

## 2020-09-23 DIAGNOSIS — I4891 Unspecified atrial fibrillation: Secondary | ICD-10-CM | POA: Diagnosis not present

## 2020-09-23 DIAGNOSIS — M810 Age-related osteoporosis without current pathological fracture: Secondary | ICD-10-CM | POA: Diagnosis present

## 2020-09-23 DIAGNOSIS — R079 Chest pain, unspecified: Secondary | ICD-10-CM | POA: Diagnosis not present

## 2020-09-23 DIAGNOSIS — G8929 Other chronic pain: Secondary | ICD-10-CM | POA: Diagnosis present

## 2020-09-23 DIAGNOSIS — H269 Unspecified cataract: Secondary | ICD-10-CM | POA: Diagnosis present

## 2020-09-23 DIAGNOSIS — Z79891 Long term (current) use of opiate analgesic: Secondary | ICD-10-CM | POA: Diagnosis not present

## 2020-09-23 DIAGNOSIS — I351 Nonrheumatic aortic (valve) insufficiency: Secondary | ICD-10-CM | POA: Diagnosis not present

## 2020-09-23 DIAGNOSIS — I959 Hypotension, unspecified: Secondary | ICD-10-CM | POA: Diagnosis not present

## 2020-09-23 LAB — CBC
HCT: 44 % (ref 36.0–46.0)
Hemoglobin: 14.3 g/dL (ref 12.0–15.0)
MCH: 28.7 pg (ref 26.0–34.0)
MCHC: 32.5 g/dL (ref 30.0–36.0)
MCV: 88.4 fL (ref 80.0–100.0)
Platelets: 307 10*3/uL (ref 150–400)
RBC: 4.98 MIL/uL (ref 3.87–5.11)
RDW: 15.1 % (ref 11.5–15.5)
WBC: 11.6 10*3/uL — ABNORMAL HIGH (ref 4.0–10.5)
nRBC: 0 % (ref 0.0–0.2)

## 2020-09-23 LAB — BASIC METABOLIC PANEL
Anion gap: 12 (ref 5–15)
BUN: 14 mg/dL (ref 8–23)
CO2: 23 mmol/L (ref 22–32)
Calcium: 9.4 mg/dL (ref 8.9–10.3)
Chloride: 103 mmol/L (ref 98–111)
Creatinine, Ser: 0.67 mg/dL (ref 0.44–1.00)
GFR, Estimated: >60 mL/min (ref >60)
Glucose, Bld: 151 mg/dL — ABNORMAL HIGH (ref 70–99)
Potassium: 3.8 mmol/L (ref 3.5–5.1)
Sodium: 138 mmol/L (ref 135–145)

## 2020-09-23 LAB — TSH: TSH: 1.06 u[IU]/mL (ref 0.350–4.500)

## 2020-09-23 LAB — MAGNESIUM: Magnesium: 2.1 mg/dL (ref 1.7–2.4)

## 2020-09-23 LAB — TROPONIN I (HIGH SENSITIVITY)
Troponin I (High Sensitivity): 5 ng/L (ref <18)
Troponin I (High Sensitivity): 7 ng/L (ref <18)
Troponin I (High Sensitivity): 8 ng/L (ref <18)

## 2020-09-23 LAB — BRAIN NATRIURETIC PEPTIDE: B Natriuretic Peptide: 397.2 pg/mL — ABNORMAL HIGH (ref 0.0–100.0)

## 2020-09-23 LAB — PROTIME-INR
INR: 1.1 (ref 0.8–1.2)
Prothrombin Time: 13.9 seconds (ref 11.4–15.2)

## 2020-09-23 LAB — SARS CORONAVIRUS 2 (TAT 6-24 HRS): SARS Coronavirus 2: NEGATIVE

## 2020-09-23 LAB — C-REACTIVE PROTEIN: CRP: 11.2 mg/dL — ABNORMAL HIGH (ref <1.0)

## 2020-09-23 LAB — D-DIMER, QUANTITATIVE: D-Dimer, Quant: 0.46 ug/mL-FEU (ref 0.00–0.50)

## 2020-09-23 MED ORDER — METOPROLOL TARTRATE 5 MG/5ML IV SOLN: 2.5000 mg | Freq: Four times a day (QID) | INTRAVENOUS | Status: DC | PRN

## 2020-09-23 MED ORDER — FENTANYL CITRATE (PF) 100 MCG/2ML IJ SOLN
INTRAMUSCULAR | Status: AC | PRN
  Administered 2020-09-23: 50 ug via INTRAVENOUS

## 2020-09-23 MED ORDER — DILTIAZEM LOAD VIA INFUSION
10.0000 mg | Freq: Once | INTRAVENOUS | Status: AC
  Administered 2020-09-23: 10 mg via INTRAVENOUS
  Filled 2020-09-23: qty 10

## 2020-09-23 MED ORDER — ACETAMINOPHEN 650 MG RE SUPP: 650.0000 mg | Freq: Four times a day (QID) | RECTAL | Status: DC | PRN

## 2020-09-23 MED ORDER — PROPOFOL 10 MG/ML IV BOLUS
0.5000 mg/kg | Freq: Once | INTRAVENOUS | Status: AC
  Administered 2020-09-23: 29.9 mg via INTRAVENOUS
  Filled 2020-09-23: qty 20

## 2020-09-23 MED ORDER — PREGABALIN 25 MG PO CAPS
25.0000 mg | ORAL_CAPSULE | Freq: Three times a day (TID) | ORAL | Status: DC
  Administered 2020-09-23 – 2020-09-27 (×11): 25 mg via ORAL
  Filled 2020-09-23 (×11): qty 1

## 2020-09-23 MED ORDER — OXYCODONE HCL ER 10 MG PO T12A
10.0000 mg | EXTENDED_RELEASE_TABLET | Freq: Two times a day (BID) | ORAL | Status: DC
  Administered 2020-09-23 – 2020-09-27 (×8): 10 mg via ORAL
  Filled 2020-09-23 (×8): qty 1

## 2020-09-23 MED ORDER — BISACODYL 5 MG PO TBEC
10.0000 mg | DELAYED_RELEASE_TABLET | Freq: Every day | ORAL | Status: DC
  Administered 2020-09-23 – 2020-09-26 (×4): 10 mg via ORAL
  Filled 2020-09-23 (×4): qty 2

## 2020-09-23 MED ORDER — ACETAMINOPHEN 325 MG PO TABS
650.0000 mg | ORAL_TABLET | Freq: Four times a day (QID) | ORAL | Status: DC | PRN
  Administered 2020-09-24: 650 mg via ORAL
  Filled 2020-09-23: qty 2

## 2020-09-23 MED ORDER — DIGOXIN 0.25 MG/ML IJ SOLN
0.2500 mg | Freq: Four times a day (QID) | INTRAMUSCULAR | Status: AC
  Administered 2020-09-23 – 2020-09-24 (×4): 0.25 mg via INTRAVENOUS
  Filled 2020-09-23: qty 2
  Filled 2020-09-23 (×6): qty 1

## 2020-09-23 MED ORDER — METOPROLOL TARTRATE 25 MG PO TABS
25.0000 mg | ORAL_TABLET | Freq: Once | ORAL | Status: AC
  Administered 2020-09-23: 25 mg via ORAL
  Filled 2020-09-23: qty 1

## 2020-09-23 MED ORDER — HYDROCORTISONE (PERIANAL) 2.5 % EX CREA
1.0000 "application " | TOPICAL_CREAM | Freq: Every day | CUTANEOUS | Status: DC | PRN
  Filled 2020-09-23: qty 28.35

## 2020-09-23 MED ORDER — SODIUM CHLORIDE 0.9 % IV BOLUS
500.0000 mL | Freq: Once | INTRAVENOUS | Status: AC
  Administered 2020-09-23: 500 mL via INTRAVENOUS

## 2020-09-23 MED ORDER — ATORVASTATIN CALCIUM 10 MG PO TABS
20.0000 mg | ORAL_TABLET | Freq: Every day | ORAL | Status: DC
  Administered 2020-09-23 – 2020-09-26 (×4): 20 mg via ORAL
  Filled 2020-09-23 (×4): qty 2

## 2020-09-23 MED ORDER — PROPOFOL 10 MG/ML IV BOLUS
INTRAVENOUS | Status: AC | PRN
  Administered 2020-09-23: 30 mg via INTRAVENOUS

## 2020-09-23 MED ORDER — DILTIAZEM HCL-DEXTROSE 125-5 MG/125ML-% IV SOLN (PREMIX)
5.0000 mg/h | INTRAVENOUS | Status: DC
  Administered 2020-09-23: 5 mg/h via INTRAVENOUS
  Filled 2020-09-23: qty 125

## 2020-09-23 MED ORDER — EDOXABAN TOSYLATE 30 MG PO TABS
30.0000 mg | ORAL_TABLET | Freq: Every day | ORAL | Status: DC
  Administered 2020-09-23 – 2020-09-26 (×4): 30 mg via ORAL
  Filled 2020-09-23 (×5): qty 1

## 2020-09-23 MED ORDER — POLYETHYLENE GLYCOL 3350 17 G PO PACK
17.0000 g | PACK | Freq: Every day | ORAL | Status: DC
  Administered 2020-09-23 – 2020-09-26 (×4): 17 g via ORAL
  Filled 2020-09-23 (×4): qty 1

## 2020-09-23 MED ORDER — FENTANYL CITRATE (PF) 100 MCG/2ML IJ SOLN
50.0000 ug | Freq: Once | INTRAMUSCULAR | Status: AC
  Administered 2020-09-23: 50 ug via INTRAVENOUS
  Filled 2020-09-23: qty 2

## 2020-09-23 MED ORDER — FUROSEMIDE 10 MG/ML IJ SOLN
40.0000 mg | Freq: Once | INTRAMUSCULAR | Status: AC
  Administered 2020-09-23: 40 mg via INTRAVENOUS
  Filled 2020-09-23: qty 4

## 2020-09-23 MED ORDER — HYDROCODONE BITARTRATE ER 15 MG PO CP12: 1.0000 | ORAL_CAPSULE | Freq: Two times a day (BID) | ORAL | Status: DC

## 2020-09-23 MED ORDER — POTASSIUM CHLORIDE CRYS ER 20 MEQ PO TBCR
40.0000 meq | EXTENDED_RELEASE_TABLET | Freq: Once | ORAL | Status: AC
  Administered 2020-09-23: 40 meq via ORAL
  Filled 2020-09-23: qty 2

## 2020-09-23 NOTE — ED Notes (Signed)
Clothing, shoes and glasses with patient via carelink. Daughter has hearing aids. Daughter given room assignment.

## 2020-09-23 NOTE — Sedation Documentation (Signed)
Patient converted back to Afib 2nd shock applied at 120J on Synced with Zoll

## 2020-09-23 NOTE — ED Notes (Signed)
Patient helped to bedpan--tolerated well.   carelink at bedside.

## 2020-09-23 NOTE — ED Notes (Addendum)
Report given to 6E RN and [facility].   While giving report patient reported mild shortness of breath. MD to bedside. Lasix ordered and admitting updated. Patient repositioned and O2 on 3L.

## 2020-09-23 NOTE — ED Provider Notes (Signed)
.  Sedation  Date/Time: 09/23/2020 3:35 PM Performed by: Milagros Loll, MD Authorized by: Milagros Loll, MD   Consent:    Consent obtained:  Verbal   Consent given by:  Patient and parent   Risks discussed:  Allergic reaction, inadequate sedation, nausea, respiratory compromise necessitating ventilatory assistance and intubation, prolonged sedation necessitating reversal, prolonged hypoxia resulting in organ damage, dysrhythmia and vomiting Universal protocol:    Immediately prior to procedure, a time out was called: yes   Indications:    Procedure performed:  Cardioversion   Procedure necessitating sedation performed by:  Different physician Pre-sedation assessment:    Time since last food or drink:  Greater than 4 hours   ASA classification: class 2 - patient with mild systemic disease     Mallampati score:  I - soft palate, uvula, fauces, pillars visible   Neck mobility: normal     Pre-sedation assessments completed and reviewed: airway patency, cardiovascular function, hydration status, mental status, nausea/vomiting, pain level, respiratory function and temperature   Immediate pre-procedure details:    Reviewed: vital signs     Verified: bag valve mask available, emergency equipment available, intubation equipment available, IV patency confirmed, oxygen available, reversal medications available and suction available   Procedure details (see MAR for exact dosages):    Preoxygenation:  Room air, nasal cannula, blow-by, nonrebreather mask and bag valve mask   Sedation:  Propofol   Intended level of sedation: moderate (conscious sedation)   Analgesia:  Fentanyl   Intra-procedure monitoring:  Blood pressure monitoring and cardiac monitor   Intra-procedure events: none     Intra-procedure management:  Fluid bolus and supplemental oxygen   Total Provider sedation time (minutes):  15 Post-procedure details:    Patient is stable for discharge or admission: yes     Procedure  completion:  Tolerated well, no immediate complications Comments:     Patient tolerated sedation well, brief drop in BP to 80 systolic that responded within a few minutes to small fluid bolus      Milagros Loll, MD 09/23/20 1540

## 2020-09-23 NOTE — H&P (Signed)
History and Physical    Tammy Walker DJM:[id number] DOB: 03/03/1931 DOA: 09/23/2020  PCP: Wilfrid Lund, PA  Patient coming from: Home.  Chief Complaint: Chest pain and palpitations.  HPI: Tammy Walker is a 87 y.o. female with history of paroxysmal atrial fibrillation and chronic pain presents to the ER at [facility] with complaints of chest pain and palpitations.  Patient symptoms started early in the midnight.  Patient took some pain relief medication and her symptoms resolved but after a few hours it came back again at this point patient decided to come to the ER.  Patient's pain is mostly behind the sternum with palpitations.  Had some mild shortness of breath.  Denies any productive cough fever or chills.  ED Course: In the ER patient was found to be in A. fib with RVR with chest x-ray showing features concerning for CHF labs are significant for BNP of 397 WBC of 11.6 high sensitive troponins were 5 and 7.  Covid test was negative.  For the CHF patient was given Lasix 40 mg IV.  And ER physician attempted cardioverting patient 3 times all of which failed.  At this point patient was started on digoxin IV per pharmacy for digitalization.  Patient admitted for A. fib with RVR with CHF.  Review of Systems: As per HPI, rest all negative.   Past Medical History:  Diagnosis Date  . Allergy   . Arrhythmia   . Atrial fibrillation (HCC)   . GERD (gastroesophageal reflux disease)   . History of bone scan    Bone Density Scans 2017 & 2019 Dr. Dutch Quint   . History of chest x-ray    Apart from large hiatus hernia, normal heart, lungs and mediastinum. Per [facility] from [facility]    . History of CT scan of abdomen 10/24/2017   Gallstones within a thin-waled gallbladder, Per records from [facility]   . [facility] of echocardiogram    [facility]   . History of EKG    Sinus rhythm, borderline 1st deg block, LAD completely changed axis, LBBB(old).  Per records from [facility]   . Hyperlipidemia   . Hypertension   . Insomnia   . Left lower lobe pneumonia 09/20/2015   Per records from [facility]   . Nodule of right lung    Per records from [facility],   . Osteoporosis    on fosamax  . Osteoporosis   . Reduced mobility     Past Surgical History:  Procedure Laterality Date  . APPENDECTOMY  [address] [place]   . CT SCAN     2017, 2018, 2019 -- re lungs see records   . TUBAL LIGATION  [address], [place], [place]      reports that she has never smoked. She has never used smokeless tobacco. She reports current alcohol use. She reports that she does not use drugs.  No Known Allergies  Family History  Problem Relation Age of Onset  . Heart disease Mother   . High blood pressure Mother   . COPD Mother   . Heart failure Mother   . Heart disease Father   . Heart disease Brother   . Heart failure Brother   . Hyperlipidemia Daughter   . Hypertension Daughter     Prior to Admission medications   Medication Sig Start Date End Date Taking? Authorizing Provider  acetaminophen (TYLENOL) 500 MG  tablet Take 500 mg by mouth every 4 (four) hours.    [provider]  amLODipine (NORVASC) 5 MG tablet Take 1 tablet (5 mg total) by mouth daily. 07/27/19   Ngetich, Dinah C, NP  atorvastatin (LIPITOR) 20 MG tablet Take 20 mg by mouth daily. 05/21/20   [provider]  bisacodyl (DULCOLAX) 5 MG EC tablet Take 10 mg by mouth daily. Two tablets (10 mg) daily    [provider]  calcium carbonate 1250 MG capsule Take 1,250 mg by mouth 2 (two) times daily with a meal.    [provider]  calcium-vitamin D (OSCAL WITH D) 500-200 MG-UNIT tablet Take 1 tablet by mouth 2 (two) times daily. 03/16/19   Ngetich, Dinah C, NP  cetirizine (ZYRTEC) 10 MG tablet Take 10 mg by mouth daily.    [provider]  denosumab (PROLIA) 60 MG/ML SOSY  injection Inject 60 mg into the skin every 6 (six) months. Last injection in 01/2019    [provider]  diclofenac Sodium (VOLTAREN) 1 % GEL Apply 4 g topically as needed (back pain).    [provider]  edoxaban (SAVAYSA) 30 MG TABS tablet Take 1 tablet (30 mg total) by mouth daily. 07/25/20   [name], MD  HYDROcodone Bitartrate ER 15 MG CP12 Take 1 capsule by mouth every 12 (twelve) hours. 07/02/20   [provider]  oxyCODONE (ROXICODONE) 5 MG/5ML solution Take 2.5 mg by mouth every 4 (four) hours as needed for breakthrough pain.    [provider]  polyethylene glycol (MIRALAX / GLYCOLAX) 17 g packet Take 17 g by mouth daily.    [provider]  pregabalin (LYRICA) 25 MG capsule Take 25 mg by mouth 3 (three) times daily.    [provider]  shark liver oil-cocoa butter (PREPARATION H) 0.25-3-85.5 % suppository Place 1 suppository rectally as needed for hemorrhoids.    [provider]    Physical Exam: Constitutional: Moderately built and nourished. Vitals:   09/23/20 1715 09/23/20 1816 09/23/20 1945 09/23/20 2002  BP:  125/89 115/75 115/75  Pulse: (!) 117 (!) 138 (!) 128 (!) 129  Resp: (!) 30 20 20    Temp: 97.9 F (36.6 C) 98.2 F (36.8 C) 97.8 F (36.6 C)   TempSrc:  Oral Oral   SpO2: 98% 98% 94%   Weight:      Height:       Eyes: Anicteric no pallor. ENMT: No discharge from the ears eyes nose or mouth. Neck: No mass felt.  No neck rigidity. Respiratory: No rhonchi or crepitations. Cardiovascular: S1-S2 heard. Abdomen: Soft nontender bowel sounds present. Musculoskeletal: No edema. Skin: No rash. Neurologic: Alert awake oriented to time place and person.  Moves all extremities. Psychiatric: Appears normal.  Normal affect.   Labs on Admission: I have personally reviewed following labs and imaging studies  CBC: Recent Labs  Lab 09/23/20 1443  WBC 11.6*  HGB 14.3  HCT 44.0  MCV 88.4  PLT 307   Basic  Metabolic Panel: Recent Labs  Lab 09/23/20 1443 09/23/20 1756  NA 138  --   K 3.8  --   CL 103  --   CO2 23  --   GLUCOSE 151*  --   BUN 14  --   CREATININE 0.67  --   CALCIUM 9.4  --   MG  --  2.1   GFR: Estimated Creatinine Clearance: 41.1 mL/min (by C-G formula based on SCr of 0.67  mg/dL). Liver Function Tests: No results for input(s): AST, ALT, ALKPHOS, BILITOT, PROT, ALBUMIN in the last 168 hours. No results for input(s): LIPASE, AMYLASE in the last 168 hours. No results for input(s): AMMONIA in the last 168 hours. Coagulation Profile: Recent Labs  Lab 09/23/20 1443  INR 1.1   Cardiac Enzymes: No results for input(s): CKTOTAL, CKMB, CKMBINDEX, TROPONINI in the last 168 hours. BNP (last 3 results) No results for input(s): PROBNP in the last 8760 hours. HbA1C: No results for input(s): HGBA1C in the last 72 hours. CBG: No results for input(s): GLUCAP in the last 168 hours. Lipid Profile: No results for input(s): CHOL, HDL, LDLCALC, TRIG, CHOLHDL, LDLDIRECT in the last 72 hours. Thyroid Function Tests: No results for input(s): TSH, T4TOTAL, FREET4, T3FREE, THYROIDAB in the last 72 hours. Anemia Panel: No results for input(s): VITAMINB12, FOLATE, FERRITIN, TIBC, IRON, RETICCTPCT in the last 72 hours. Urine analysis: No results found for: COLORURINE, APPEARANCEUR, LABSPEC, PHURINE, GLUCOSEU, HGBUR, BILIRUBINUR, KETONESUR, PROTEINUR, UROBILINOGEN, NITRITE, LEUKOCYTESUR Sepsis Labs: @LABRCNTIP (procalcitonin:4,lacticidven:4) )No results found for this or any previous visit (from the past 240 hour(s)).   Radiological Exams on Admission: DG Chest Port 1 View  Result Date: 09/23/2020 CLINICAL DATA:  Chest pain EXAM: PORTABLE CHEST 1 VIEW COMPARISON:  None. FINDINGS: Cardiomegaly. Small, layering bilateral pleural effusions. The visualized skeletal structures are unremarkable. IMPRESSION: Cardiomegaly with small, layering bilateral pleural effusions. Electronically Signed    By: 09/25/2020 M.D.   On: 09/23/2020 15:23    EKG: Independently reviewed.  A. fib with RVR.  Assessment/Plan Principal Problem:   Atrial fibrillation with RVR (HCC) Active Problems:   Acute CHF (congestive heart failure) (HCC)    1. A. fib with RVR -patient still has a heart rate around 140 bpm with systolic blood pressure in the 120s.  I discussed with on-call cardiologist Dr. 09/25/2020.  Patient is already been started on digoxin IV for digitalization and 1 dose of metoprolol 25 p.o. was ordered by me.  At this time cardiologist advised starting patient on Cardizem infusion.  And they will be seeing patient in consult.  Will check D-dimer and thyroid function test.  Trend cardiac markers check 2D echo.  Patient is already on edoxaban for anticoagulation.  Patient has had high deposits in the eye after taking amiodarone which was discontinued in March 2021 by cardiologist. 2. Acute CHF likely precipitated by A. fib with RVR.  Will check 2D echo.  Received 1 dose of Lasix.  Will check D-dimer thyroid function test closely follow intake output Daily weights.  Further dose of Lasix to be determined based on patient's symptoms. 3. Chest pain could be related to patient's heart rate.  Cardiac markers so far negative.  Will check D-dimer.  Check 2D echo.  On edoxaban. 4. Patient's amiodarone was discontinued in March 2021 due to high deposits in eye. 5. Chronic pain on hydrocodone pregabalin.  Since patient have A. fib with RVR with CHF symptoms will need inpatient status.   DVT prophylaxis: Edoxaban. Code Status: Full code. Family Communication: Daughter at the bedside. Disposition Plan: Home. Consults called: Cardiology. Admission status: Inpatient.   April 2021 MD [facility] Pager [phone].  If 7PM-7AM, please contact night-coverage [email or web address] Password [email or web address]  09/23/2020, 8:40 PM

## 2020-09-23 NOTE — ED Triage Notes (Signed)
Pt reports chest wall pain that started at 0300 this am. Pt reports SOB with stabbing sensation. Pt went to the [facility] and sent here for evaluation.

## 2020-09-23 NOTE — ED Provider Notes (Addendum)
Signout note  87 year old lady with history of paroxysmal atrial fibrillation, on Xa inhibitor presenting with chest pain shortness of breath.  Cardioversion unsuccessful.  Given low-dose tilt bolus and started on dilt gtt. Plan to admit to [facility] for further management.  Discussed case with Dr. Chipper Herb.  Given possible heart failure on CXR, recommends switching to digoxin.  Will stop dilt.  Dr. Chipper Herb will place orders for digoxin.  Will admit to stepdown unit.  While waiting transport, patient's work of breathing worsened.  She was maintained on 2-3 L nasal cannula.  Mentation is still good.  BNP is quite elevated.  Will provide dose of Lasix.  Provided update to Dr. Chipper Herb.  Pt has bed, will be transported shortly.  Had goals of care conversation with patient and daughter, she wishes to be full code at this time.  CRITICAL CARE Performed by: Milagros Loll  Total critical care time: 43 minutes  Critical care time was exclusive of separately billable procedures and treating other patients.  Critical care was necessary to treat or prevent imminent or life-threatening deterioration.  Critical care was time spent personally by me on the following activities: development of treatment plan with patient and/or surrogate as well as nursing, discussions with consultants, evaluation of patient's response to treatment, examination of patient, obtaining history from patient or surrogate, ordering and performing treatments and interventions, ordering and review of laboratory studies, ordering and review of radiographic studies, pulse oximetry and re-evaluation of patient's condition.    Milagros Loll, MD 09/23/20 1711    Milagros Loll, MD 09/23/20 1725

## 2020-09-23 NOTE — Progress Notes (Signed)
MEDICATION RELATED NOTE   Pharmacy Re:  Hydrocodone Bitartrate SR Indication: Home Meds   Plan:  Hydrocodone Bitartrate SR is not stocked in the pharmacy.  Chatted with Dr. Toniann Fail and we will use Oxycontin SR while she is hospitalized.   No Known Allergies  Patient Measurements: Height: 5\' 1"  (154.9 cm) Weight: 59.7 kg (131 lb 11.2 oz) IBW/kg (Calculated) : 47.8   Vital Signs: Temp: 97.8 F (36.6 C) (03/27 2015) Temp Source: Oral (03/27 2015) BP: 116/83 (03/27 2015) Pulse Rate: 129 (03/27 2002) Intake/Output from previous day: No intake/output data recorded. Intake/Output from this shift: No intake/output data recorded.  Labs: Recent Labs    09/23/20 1443 09/23/20 1756  WBC 11.6*  --   HGB 14.3  --   HCT 44.0  --   PLT 307  --   CREATININE 0.67  --   MG  --  2.1   Estimated Creatinine Clearance: 41.1 mL/min (by C-G formula based on SCr of 0.67 mg/dL).   Microbiology: No results found for this or any previous visit (from the past 720 hour(s)).  Medical History: Past Medical History:  Diagnosis Date  . Allergy   . Arrhythmia   . Atrial fibrillation (HCC)   . GERD (gastroesophageal reflux disease)   . History of bone scan    Bone Density Scans 2017 & 2019 Dr. 2020   . History of chest x-ray    Apart from large hiatus hernia, normal heart, lungs and mediastinum. Per records from [facility]    . History of CT scan of abdomen 10/24/2017   Gallstones within a thin-waled gallbladder, Per records from [facility]   . [facility] of echocardiogram    [facility]   . History of EKG    Sinus rhythm, borderline 1st deg block, LAD completely changed axis, LBBB(old). Per records from [facility]   . Hyperlipidemia   . Hypertension   . Insomnia   . Left lower lobe pneumonia 09/20/2015   Per records from [facility]   . Nodule of right lung    Per records from [facility],   . Osteoporosis    on fosamax  . Osteoporosis   . Reduced mobility    [facility], PharmD., MS Clinical Pharmacist  Thank you for allowing pharmacy to be part of this patients care team. 09/23/2020,9:01 PM

## 2020-09-23 NOTE — ED Notes (Signed)
Receiving RN updated on lasix due to shortness of breath, no questions.

## 2020-09-23 NOTE — Sedation Documentation (Signed)
Shock applied 120J on synced

## 2020-09-23 NOTE — ED Provider Notes (Addendum)
[facility] EMERGENCY DEPARTMENT Provider Note  CSN: [id number] Arrival date & time: 09/23/20 1422    History Chief Complaint  Patient presents with  . Chest Pain    HPI Patient and daughter at bedside provide history  [facility] with remote history of atrial fibrillation (diagnosed in 2002) was maintained on amiodarone and coumadin for many years with no known further episodes of afib in the interim.  She had been living in [place] for about a decade until she moved here last year. She was switched from coumadin to Savaysa (Factor Xa inhibitor) after the pandemic hit in [place] (to decrease clinic visits for INR checks) which has been taking regularly since arriving in the [place]. She had her amiodarone stopped about a year ago when ophtho found concerning corneal changes. Patient has daily MSK chest pains, probably due to significant kyphosis but during the night woke up around 3am with excruciating midsternal chest pains and SOB. Symptoms returned about 2 hours ago and so daughter brought her to the ED for evaluation. Daughter reports no missed doses of blood thinner. She was cardioverted during her initial afib episode in 2002.    Past Medical History:  Diagnosis Date  . Allergy   . Arrhythmia   . Atrial fibrillation (HCC)   . GERD (gastroesophageal reflux disease)   . History of bone scan    Bone Density Scans 2017 & 2019 Dr. Dutch QuintPoole   . History of chest x-ray    Apart from large hiatus hernia, normal heart, lungs and mediastinum. [facility]    . History of CT scan of abdomen 10/24/2017   Gallstones within a thin-waled gallbladder, [facility]   . [facility] of echocardiogram    [facility]   . History of EKG    Sinus rhythm, borderline 1st deg block, LAD completely changed axis, LBBB(old). [facility]   . Hyperlipidemia   .  Hypertension   . Insomnia   . Left lower lobe pneumonia 09/20/2015   [facility]   . Nodule of right lung    [facility],   . Osteoporosis    on fosamax  . Reduced mobility     Past Surgical History:  Procedure Laterality Date  . APPENDECTOMY  [address]1947   [place]   . CT SCAN     2017, 2018, 2019 -- re lungs see records   . TUBAL [place]IG[place]  1978   [facility]     Family History  Problem Relation Age of Onset  . Heart disease Mother   . High blood pressure Mother   . COPD Mother   . Heart failure Mother   . Heart disease Father   . Heart disease Brother   . Heart failure Brother   . Hyperlipidemia Daughter   . Hypertension Daughter     Social History   Tobacco Use  . Smoking status: Never Smoker  . Smokeless tobacco: Never Used  Vaping Use  . Vaping Use: Never used  Substance Use Topics  . Alcohol use: Yes    Comment: less than one drink a week  . Drug use: No     Home Medications Prior to Admission medications   Medication Sig Start Date End Date Taking? Authorizing Provider  acetaminophen (TYLENOL) 500 MG tablet Take 500 mg by mouth  every 4 (four) hours.    [provider]  amLODipine (NORVASC) 5 MG tablet Take 1 tablet (5 mg total) by mouth daily. 07/27/19   Ngetich, Dinah C, NP  atorvastatin (LIPITOR) 20 MG tablet Take 20 mg by mouth daily. 05/21/20   [provider]  bisacodyl (DULCOLAX) 5 MG EC tablet Take 10 mg by mouth daily. Two tablets (10 mg) daily    [provider]  calcium carbonate 1250 MG capsule Take 1,250 mg by mouth 2 (two) times daily with a meal.    [provider]  calcium-vitamin D (OSCAL WITH D) 500-200 MG-UNIT tablet Take 1 tablet by mouth 2 (two) times daily. 03/16/19   Ngetich, Dinah C, NP  cetirizine (ZYRTEC) 10 MG tablet Take 10 mg by mouth daily.    [provider]  denosumab (PROLIA) 60 MG/ML SOSY  injection Inject 60 mg into the skin every 6 (six) months. Last injection in 01/2019    [provider]  diclofenac Sodium (VOLTAREN) 1 % GEL Apply 4 g topically as needed (back pain).    [provider]  edoxaban (SAVAYSA) 30 MG TABS tablet Take 1 tablet (30 mg total) by mouth daily. 07/25/20   [name], MD  HYDROcodone Bitartrate ER 15 MG CP12 Take 1 capsule by mouth every 12 (twelve) hours. 07/02/20   [provider]  oxyCODONE (ROXICODONE) 5 MG/5ML solution Take 2.5 mg by mouth every 4 (four) hours as needed for breakthrough pain.    [provider]  polyethylene glycol (MIRALAX / GLYCOLAX) 17 g packet Take 17 g by mouth daily.    [provider]  pregabalin (LYRICA) 25 MG capsule Take 25 mg by mouth 3 (three) times daily.    [provider]  shark liver oil-cocoa butter (PREPARATION H) 0.25-3-85.5 % suppository Place 1 suppository rectally as needed for hemorrhoids.    [provider]     Allergies    Patient has no known allergies.   Review of Systems   Review of Systems A comprehensive review of systems was completed and negative except as noted in HPI.    Physical Exam BP 92/70   Pulse 100   Temp 99.4 F (37.4 C) (Oral)   Resp 19   Ht 5\' 1"  (1.549 m)   Wt 59.7 kg   SpO2 100%   BMI 24.88 kg/m   Physical Exam Vitals and nursing note reviewed.  Constitutional:      Appearance: Normal appearance.  HENT:     Head: Normocephalic and atraumatic.     Nose: Nose normal.     Mouth/Throat:     Mouth: Mucous membranes are moist.  Eyes:     Extraocular Movements: Extraocular movements intact.     Conjunctiva/sclera: Conjunctivae normal.  Cardiovascular:     Rate and Rhythm: Tachycardia present. Rhythm irregular.  Pulmonary:     Effort: Pulmonary effort is normal.     Breath sounds: Normal breath sounds.  Abdominal:     General: Abdomen is flat.     Palpations: Abdomen is soft.     Tenderness: There is no  abdominal tenderness.  Musculoskeletal:        General: No swelling. Normal range of motion.     Cervical back: Neck supple.  Skin:    General: Skin is warm and dry.  Neurological:     General: No focal deficit present.     Mental Status: She is alert.  Psychiatric:  Mood and Affect: Mood normal.      ED Results / Procedures / Treatments   Labs (all labs ordered are listed, but only abnormal results are displayed) Labs Reviewed  BASIC METABOLIC PANEL - Abnormal; Notable for the following components:      Result Value   Glucose, Bld 151 (*)    All other components within normal limits  CBC - Abnormal; Notable for the following components:   WBC 11.6 (*)    All other components within normal limits  SARS CORONAVIRUS 2 (TAT 6-24 HRS)  TROPONIN I (HIGH SENSITIVITY)    EKG None   Radiology DG Chest Port 1 View  Result Date: 09/23/2020 CLINICAL DATA:  Chest pain EXAM: PORTABLE CHEST 1 VIEW COMPARISON:  None. FINDINGS: Cardiomegaly. Small, layering bilateral pleural effusions. The visualized skeletal structures are unremarkable. IMPRESSION: Cardiomegaly with small, layering bilateral pleural effusions. Electronically Signed   By: Lauralyn Primes M.D.   On: 09/23/2020 15:23    Procedures .Cardioversion  Date/Time: 09/23/2020 3:32 PM Performed by: Pollyann Savoy, MD Authorized by: Pollyann Savoy, MD   Consent:    Consent obtained:  Verbal   Consent given by:  Patient and healthcare agent   Risks discussed:  Cutaneous burn, pain and induced arrhythmia   Alternatives discussed:  Rate-control medication Pre-procedure details:    Cardioversion basis:  Elective   Rhythm:  Atrial fibrillation   Electrode placement:  Anterior-posterior Patient sedated: Yes. Refer to sedation procedure documentation for details of sedation.  Attempt one:    Cardioversion mode:  Synchronous   Waveform:  Biphasic   Shock (Joules):  120   Shock outcome:  Conversion to normal sinus  rhythm (brief conversion to NSR then back to rapid afib) Attempt two:    Cardioversion mode:  Synchronous   Waveform:  Biphasic   Shock (Joules):  120   Shock outcome:  No change in rhythm Attempt three:    Cardioversion mode:  Asynchronous   Waveform:  Biphasic   Shock (Joules):  150   Shock outcome:  No change in rhythm Post-procedure details:    Patient status:  Awake   Patient tolerance of procedure:  Tolerated well, no immediate complications Comments:     Unsuccessful cardioversion    .Critical Care Performed by: Pollyann Savoy, MD Authorized by: Pollyann Savoy, MD   Critical care provider statement:    Critical care time (minutes):  35   Critical care time was exclusive of:  Separately billable procedures and treating other patients   Critical care was necessary to treat or prevent imminent or life-threatening deterioration of the following conditions:  Circulatory failure   Critical care was time spent personally by me on the following activities:  Discussions with consultants, evaluation of patient's response to treatment, examination of patient, ordering and performing treatments and interventions, ordering and review of laboratory studies, ordering and review of radiographic studies, pulse oximetry, re-evaluation of patient's condition, obtaining history from patient or surrogate and review of old charts    Medications Ordered in the ED Medications  diltiazem (CARDIZEM) 1 mg/mL load via infusion 10 mg (10 mg Intravenous Bolus from Bag 09/23/20 1535)  diltiazem (CARDIZEM) 125 mg in dextrose 5% 125 mL (1 mg/mL) infusion (has no administration in time range)  propofol (DIPRIVAN) 10 mg/mL bolus/IV push 29.9 mg (29.9 mg Intravenous Given 09/23/20 1505)  fentaNYL (SUBLIMAZE) injection 50 mcg (50 mcg Intravenous Given 09/23/20 1506)  propofol (DIPRIVAN) 10 mg/mL bolus/IV push (30 mg Intravenous Given  09/23/20 1505)  fentaNYL (SUBLIMAZE) injection (50 mcg Intravenous  Given 09/23/20 1506)  sodium chloride 0.9 % bolus 500 mL (0 mLs Intravenous Stopped 09/23/20 1526)     MDM Rules/Calculators/A&P MDM Spoke with Dr. Cristal Deer, on call for Cardiology who has reviewed patient's EKG and limited available history in Epic. She is in agreement to proceed with cardioversion. Discussed this with patient and daughter (who is HCPOA) who are in agreement to proceed.   ED Course  I have reviewed the triage vital signs and the nursing notes.  Pertinent labs & imaging results that were available during my care of the patient were reviewed by me and considered in my medical decision making (see chart for details).  Clinical Course as of 09/23/20 1536  Sun Sep 23, 2020  1535 Electrical cardioversion was unsuccessful. Dr. Ephraim Hamburger at bedside to perform sedation. Will initiate cardizem drip, anticipate admission. Care signed out to Dr. Lenna Gilford at the change of shift.  [CS]    Clinical Course User Index [CS] Pollyann Savoy, MD    Final Clinical Impression(s) / ED Diagnoses Final diagnoses:  None    Rx / DC Orders ED Discharge Orders    None       Pollyann Savoy, MD 09/23/20 1536    Pollyann Savoy, MD 09/23/20 1537

## 2020-09-23 NOTE — Progress Notes (Signed)
   09/23/20 1715  Assess: MEWS Score  Temp 97.9 F (36.6 C)  Pulse Rate (!) 117  Resp (!) 30  SpO2 98 %  O2 Device Nasal Cannula  O2 Flow Rate (L/min) 3 L/min  Assess: MEWS Score  MEWS Temp 0  MEWS Systolic 0  MEWS Pulse 2  MEWS RR 2  MEWS LOC 1  MEWS Score 5  MEWS Score Color Red  Assess: if the MEWS score is Yellow or Red  Were vital signs taken at a resting state? Yes  Focused Assessment No change from prior assessment  Early Detection of Sepsis Score *See Row Information* Low  MEWS guidelines implemented *See Row Information* Yes  Treat  MEWS Interventions Other (Comment) (Consulted MD; pt new admit no orders)  Pain Scale 0-10  Pain Score 0  Take Vital Signs  Increase Vital Sign Frequency  Red: Q 1hr X 4 then Q 4hr X 4, if remains red, continue Q 4hrs  Escalate  MEWS: Escalate Red: discuss with charge nurse/RN and provider, consider discussing with RRT  Notify: Charge Nurse/RN  Name of Charge Nurse/RN Notified Bonita Quin  Date Charge Nurse/RN Notified 09/23/20  Time Charge Nurse/RN Notified 1715  Notify: Provider  Provider Name/Title [facility] ADMISSIONS  Date Provider Notified 09/23/20  Time Provider Notified 1720  Notification Type Page  Notification Reason Other (Comment) (MEWS RED)  Date of Provider Response 09/23/20  Time of Provider Response 1850  Document  Patient Outcome Other (Comment) (stable will continue to monitor)  Progress note created (see row info) Yes

## 2020-09-23 NOTE — Progress Notes (Signed)
ANTICOAGULATION CONSULT NOTE - Initial Consult  Pharmacy Consult for edoxaban Indication: atrial fibrillation  No Known Allergies  Patient Measurements: Height: 5\' 1"  (154.9 cm) Weight: 59.7 kg (131 lb 11.2 oz) IBW/kg (Calculated) : 47.8   Vital Signs: Temp: 97.8 F (36.6 C) (03/27 1945) Temp Source: Oral (03/27 1945) BP: 115/75 (03/27 2002) Pulse Rate: 129 (03/27 2002)  Labs: Recent Labs    09/23/20 1443 09/23/20 1756  HGB 14.3  --   HCT 44.0  --   PLT 307  --   LABPROT 13.9  --   INR 1.1  --   CREATININE 0.67  --   TROPONINIHS 5 7    Estimated Creatinine Clearance: 41.1 mL/min (by C-G formula based on SCr of 0.67 mg/dL).   Medical History: Past Medical History:  Diagnosis Date  . Allergy   . Arrhythmia   . Atrial fibrillation (HCC)   . GERD (gastroesophageal reflux disease)   . History of bone scan    Bone Density Scans 2017 & 2019 Dr. 2020   . History of chest x-ray    Apart from large hiatus hernia, normal heart, lungs and mediastinum. [facility]    . History of CT scan of abdomen 10/24/2017   Gallstones within a thin-waled gallbladder, Per records [facility]   . [facility] of echocardiogram    [facility] &  [facility]   . History of EKG    Sinus rhythm, borderline 1st deg block, LAD completely changed axis, LBBB(old). Per records from [facility]   . Hyperlipidemia   . Hypertension   . Insomnia   . Left lower lobe pneumonia 09/20/2015   [facility]   . Nodule of right lung    [facility],   . Osteoporosis    on fosamax  . Osteoporosis   . Reduced mobility     Medications:  Medications Prior to Admission  Medication Sig Dispense Refill Last Dose  . acetaminophen (TYLENOL) 500 MG tablet Take 500 mg by mouth every 4 (four) hours.     [facility] amLODipine (NORVASC) 5 MG tablet Take 1 tablet (5 mg total) by  mouth daily. 90 tablet 1   . atorvastatin (LIPITOR) 20 MG tablet Take 20 mg by mouth daily.     . bisacodyl (DULCOLAX) 5 MG EC tablet Take 10 mg by mouth daily. Two tablets (10 mg) daily     . calcium carbonate 1250 MG capsule Take 1,250 mg by mouth 2 (two) times daily with a meal.     . calcium-vitamin D (OSCAL WITH D) 500-200 MG-UNIT tablet Take 1 tablet by mouth 2 (two) times daily. 60 tablet 3   . cetirizine (ZYRTEC) 10 MG tablet Take 10 mg by mouth daily.     [name] denosumab (PROLIA) 60 MG/ML SOSY injection Inject 60 mg into the skin every 6 (six) months. Last injection in 01/2019     . diclofenac Sodium (VOLTAREN) 1 % GEL Apply 4 g topically as needed (back pain).     03/2019 edoxaban (SAVAYSA) 30 MG TABS tablet Take 1 tablet (30 mg total) by mouth daily. 90 tablet 3   . HYDROcodone Bitartrate ER 15 MG CP12 Take 1 capsule by mouth every 12 (twelve) hours.     [name] oxyCODONE (ROXICODONE) 5 MG/5ML solution Take 2.5 mg by mouth every 4 (four) hours as needed for breakthrough pain.     . polyethylene glycol (  MIRALAX / GLYCOLAX) 17 g packet Take 17 g by mouth daily.     . pregabalin (LYRICA) 25 MG capsule Take 25 mg by mouth 3 (three) times daily.     . shark liver oil-cocoa butter (PREPARATION H) 0.25-3-85.5 % suppository Place 1 suppository rectally as needed for hemorrhoids.      Scheduled:  . atorvastatin  20 mg Oral QHS  . bisacodyl  10 mg Oral QHS  . digoxin  0.25 mg Intravenous Q6H  . polyethylene glycol  17 g Oral QHS  . pregabalin  25 mg Oral TID    Assessment: 87 yo female w/ SOB. She is on edoxaban PTA for history of afib. Pharmacy consulted to dose. She is noted s/p unsuccessful cardioversion in the ED.  -Hg= 14.3 -plt= 307 -last dose was 3/26 at 8pm (she normally takes it in the evening)  Goal of Therapy:  Monitor platelets by anticoagulation protocol: Yes   Plan:   -edoxaban 30mg  po once daily in the evening -Will follow peripherally  , PharmD Clinical  Pharmacist **Pharmacist phone directory can now be found on [email or web address] ([email or web address]).  Listed under [facility].

## 2020-09-23 NOTE — Sedation Documentation (Signed)
Shock applied at 150J

## 2020-09-24 ENCOUNTER — Inpatient Hospital Stay (HOSPITAL_COMMUNITY): Payer: Medicare Other

## 2020-09-24 DIAGNOSIS — R079 Chest pain, unspecified: Secondary | ICD-10-CM | POA: Diagnosis not present

## 2020-09-24 DIAGNOSIS — I509 Heart failure, unspecified: Secondary | ICD-10-CM

## 2020-09-24 DIAGNOSIS — I351 Nonrheumatic aortic (valve) insufficiency: Secondary | ICD-10-CM

## 2020-09-24 DIAGNOSIS — I4891 Unspecified atrial fibrillation: Secondary | ICD-10-CM

## 2020-09-24 DIAGNOSIS — I361 Nonrheumatic tricuspid (valve) insufficiency: Secondary | ICD-10-CM

## 2020-09-24 LAB — COMPREHENSIVE METABOLIC PANEL
ALT: 13 U/L (ref 0–44)
AST: 22 U/L (ref 15–41)
Albumin: 3.2 g/dL — ABNORMAL LOW (ref 3.5–5.0)
Alkaline Phosphatase: 40 U/L (ref 38–126)
Anion gap: 8 (ref 5–15)
BUN: 11 mg/dL (ref 8–23)
CO2: 25 mmol/L (ref 22–32)
Calcium: 8.9 mg/dL (ref 8.9–10.3)
Chloride: 107 mmol/L (ref 98–111)
Creatinine, Ser: 0.79 mg/dL (ref 0.44–1.00)
GFR, Estimated: >60 mL/min (ref >60)
Glucose, Bld: 117 mg/dL — ABNORMAL HIGH (ref 70–99)
Potassium: 4.1 mmol/L (ref 3.5–5.1)
Sodium: 140 mmol/L (ref 135–145)
Total Bilirubin: 1.8 mg/dL — ABNORMAL HIGH (ref 0.3–1.2)
Total Protein: 6.2 g/dL — ABNORMAL LOW (ref 6.5–8.1)

## 2020-09-24 LAB — ECHOCARDIOGRAM COMPLETE
Area-P 1/2: 4.17 cm2
Calc EF: 51.5 %
Height: 61 in
P 1/2 time: 606 msec
S' Lateral: 1.8 cm
Single Plane A2C EF: 35.4 %
Single Plane A4C EF: 58.7 %
Weight: 2009.6 oz

## 2020-09-24 LAB — CBC WITH DIFFERENTIAL/PLATELET
Abs Immature Granulocytes: 0.02 10*3/uL (ref 0.00–0.07)
Basophils Absolute: 0 10*3/uL (ref 0.0–0.1)
Basophils Relative: 1 %
Eosinophils Absolute: 0 10*3/uL (ref 0.0–0.5)
Eosinophils Relative: 0 %
HCT: 39.6 % (ref 36.0–46.0)
Hemoglobin: 12.7 g/dL (ref 12.0–15.0)
Immature Granulocytes: 0 %
Lymphocytes Relative: 28 %
Lymphs Abs: 2 10*3/uL (ref 0.7–4.0)
MCH: 28.5 pg (ref 26.0–34.0)
MCHC: 32.1 g/dL (ref 30.0–36.0)
MCV: 89 fL (ref 80.0–100.0)
Monocytes Absolute: 0.7 10*3/uL (ref 0.1–1.0)
Monocytes Relative: 10 %
Neutro Abs: 4.4 10*3/uL (ref 1.7–7.7)
Neutrophils Relative %: 61 %
Platelets: 279 10*3/uL (ref 150–400)
RBC: 4.45 MIL/uL (ref 3.87–5.11)
RDW: 15 % (ref 11.5–15.5)
WBC: 7.2 10*3/uL (ref 4.0–10.5)
nRBC: 0 % (ref 0.0–0.2)

## 2020-09-24 LAB — TROPONIN I (HIGH SENSITIVITY)
Troponin I (High Sensitivity): 10 ng/L (ref <18)
Troponin I (High Sensitivity): 9 ng/L (ref <18)

## 2020-09-24 MED ORDER — DIGOXIN 125 MCG PO TABS
0.0625 mg | ORAL_TABLET | Freq: Every day | ORAL | Status: DC
  Administered 2020-09-25 – 2020-09-27 (×3): 0.0625 mg via ORAL
  Filled 2020-09-24 (×3): qty 1

## 2020-09-24 MED ORDER — ONDANSETRON HCL 4 MG/2ML IJ SOLN
4.0000 mg | Freq: Four times a day (QID) | INTRAMUSCULAR | Status: AC | PRN
  Administered 2020-09-24 – 2020-09-25 (×2): 4 mg via INTRAVENOUS
  Filled 2020-09-24 (×2): qty 2

## 2020-09-24 NOTE — Consult Note (Addendum)
Cardiology Consultation:   Patient ID: Tammy Walker MRN: [id number]; DOB: 1931/03/31  Admit date: 09/23/2020 Date of Consult: 09/24/2020  PCP:  Tammy Lund, PA   [facility]  Cardiologist:  Donato Schultz, MD  :[id number]}    Patient Profile:   Tammy Walker is a 87 y.o. female with a hx of HTN, HLD, GERD, AFib, LBBB who is being seen today for the evaluation of AAD options at the request of Dr. Izora Walker.   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 Tammy Walker last saw Dr. Anne Walker via tele health visit Jan 2022, she was doing well, mentions a known single episode of AF back in 2002 and maintained all this time on amiodarone until last year.  Went to [facility] yesterday with c/o fluttering in her chest, CP, SOB found in AFib RVR, Chest x-ray showed cardiomegaly with small bilateral pleural effusion concerning for acute CHF.  BNP 397.  High-sensitivity troponin negative x2.  Patient was given a single dose of Lasix 40 mg IV.  Since the patient has been compliant with anticoagulation therapy, ER physician attempted cardioversion 3 times, of which were unsuccessful.  She was started on IV digoxin and IV Cardizem was started as well thoughstopped due to hypotension.  Transferred to [facility], she was seen this AM by Dr. Izora Walker who asked EPto see her for consideratio of Tikosyn S/p IV lasix and not overtly volume OL currently CP suspect 2/2 RVR, if recurrent despite controlled rates may consider outpt stress testing.  LABS HS Trop 5, 7, 8, 10 K+ 3.8, 4.1 BUN/Creat 11/0.79  (calc CrCl is 45) Mag 2.1 (yesterday) BNP 397 WBC 7.2 H/H 12/39 Plts 279 TSH 1.060 ddimer 0.46 (wnl)  S/p dig 0.25mg  Q6 > got 4 doses with plans to reduce tomorrow  to 0.0625  Yesterday she got dit 10mg  bolus w/gtt briefly Lopressor 25mg  x1   Past Medical History:  Diagnosis Date   Allergy    Arrhythmia    Atrial fibrillation (HCC)    GERD (gastroesophageal reflux disease)    History of bone scan    Bone Density Scans 2017 & 2019 Dr. 2018    History of chest x-ray    Apart from large hiatus hernia, normal heart, lungs and mediastinum. Per records from [facility]     History of CT scan of abdomen 10/24/2017   Gallstones within a thin-waled gallbladder, Per records from [facility]    History of echocardiogram    [facility] &  following   [facility]    History of EKG    Sinus rhythm, borderline 1st deg block, LAD completely changed axis, LBBB(old). Per records from [facility]    Hyperlipidemia    Hypertension    Insomnia    Left lower lobe pneumonia 09/20/2015   Per records from [facility]    Nodule of right lung    Per records from [facility],    Osteoporosis    on fosamax   Osteoporosis    Reduced mobility     Past Surgical History:  Procedure Laterality Date   APPENDECTOMY  [address] [facility]    CT SCAN     2017, 2018, 2019 -- re lungs see records  TUBAL LIGATION  1978   [facility], [place], [place]      Home Medications:  Prior to Admission medications   Medication Sig Start Date End Date Taking? Authorizing Provider  acetaminophen (TYLENOL) 500 MG tablet Take 500 mg by mouth every 4 (four) hours.    [provider]  amLODipine (NORVASC) 5 MG tablet Take 1 tablet (5 mg total) by mouth daily. 07/27/19   Ngetich, Dinah C, NP  atorvastatin (LIPITOR) 20 MG tablet Take 20 mg by mouth daily. 05/21/20   [provider]  bisacodyl (DULCOLAX) 5 MG EC tablet Take 10 mg by mouth daily. Two tablets (10 mg) daily    [provider]  calcium carbonate 1250 MG capsule Take 1,250 mg by mouth 2 (two) times daily with a meal.     [provider]  calcium-vitamin D (OSCAL WITH D) 500-200 MG-UNIT tablet Take 1 tablet by mouth 2 (two) times daily. 03/16/19   Ngetich, Dinah C, NP  cetirizine (ZYRTEC) 10 MG tablet Take 10 mg by mouth daily.    [provider]  denosumab (PROLIA) 60 MG/ML SOSY injection Inject 60 mg into the skin every 6 (six) months. Last injection in 01/2019    [provider]  diclofenac Sodium (VOLTAREN) 1 % GEL Apply 4 g topically as needed (back pain).    [provider]  edoxaban (SAVAYSA) 30 MG TABS tablet Take 1 tablet (30 mg total) by mouth daily. 07/25/20   Tammy Bathe, MD  HYDROcodone Bitartrate ER 15 MG CP12 Take 1 capsule by mouth every 12 (twelve) hours. 07/02/20   [provider]  oxyCODONE (ROXICODONE) 5 MG/5ML solution Take 2.5 mg by mouth every 4 (four) hours as needed for breakthrough pain.    [provider]  polyethylene glycol (MIRALAX / GLYCOLAX) 17 g packet Take 17 g by mouth daily.    [provider]  pregabalin (LYRICA) 25 MG capsule Take 25 mg by mouth 3 (three) times daily.    [provider]  shark liver oil-cocoa butter (PREPARATION H) 0.25-3-85.5 % suppository Place 1 suppository rectally as needed for hemorrhoids.    [provider]    Inpatient Medications: Scheduled Meds:  atorvastatin  20 mg Oral QHS   bisacodyl  10 mg Oral QHS   [START ON 09/25/2020] digoxin  0.0625 mg Oral Daily   edoxaban  30 mg Oral QHS   oxyCODONE  10 mg Oral Q12H   polyethylene glycol  17 g Oral QHS   pregabalin  25 mg Oral TID   Continuous Infusions:  diltiazem (CARDIZEM) infusion Stopped (09/24/20 0425)   PRN Meds: acetaminophen **OR** acetaminophen, hydrocortisone  Allergies:   No Known Allergies  Social History:   Social History   Socioeconomic History   Marital status: Widowed    Spouse name: Not on file   Number of children: Not on file   Years of education: Not on file   Highest education level: Not  on file  Occupational History   Not on file  Tobacco Use   Smoking status: Never Smoker   Smokeless tobacco: Never Used  Vaping Use   Vaping Use: Never used  Substance and Sexual Activity   Alcohol use: Yes    Comment: less than one drink a week   Drug use: No   Sexual activity: Never    Birth control/protection: None  Other Topics Concern   Not on file  Social History Narrative   Social History  Diet? I watch my intake of salt, sugar, and fat      Do you drink/eat things with caffeine? Yes       Marital status?      Widowed                               What year were you married? 1960      Do you live in a house, apartment, assisted living, condo, trailer, etc.? House       Is it one or more stories? one      How many persons live in your home? 2      Do you have any pets in your home? (please list) yes 2 cats       Highest level of education completed? 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             yes                         Type & how often? Strength & Balance       Advanced Directives      Do you have a living will?  No       Do you have a DNR form?        No                           If not, do you want to discuss one? No       Do you have signed POA/HPOA for forms? Yes       Functional Status      Do you have difficulty bathing or dressing yourself? No       Do you have difficulty preparing food or eating? No       Do you have difficulty managing your medications? No       Do you have difficulty managing your finances? No       Do you have difficulty affording your medications? No       Social Determinants of Corporate investment bankerHealth   Financial Resource Strain: Not on file  Food Insecurity: Not on file  Transportation Needs: Not on file  Physical Activity: Not on file  Stress: Not on file  Social Connections: Not on file  Intimate Partner Violence: Not on  file    Family History:   Family History  Problem Relation Age of Onset   Heart disease Mother    High blood pressure Mother    COPD Mother    Heart failure Mother    Heart disease Father    Heart disease Brother    Heart failure Brother    Hyperlipidemia Daughter    Hypertension Daughter      ROS:  Please see the history of present illness.  All other ROS reviewed and negative.     Physical Exam/Data:   Vitals:   09/24/20 0033 09/24/20 0425 09/24/20 0700 09/24/20 1224  BP: (!) 88/68 103/63 (!) 99/50 98/60  Pulse: 83 90 95 92  Resp: 15 16 16 16   Temp: 97.8 F (36.6 C) 97.6 F (36.4 C) 99.1 F (37.3 C) (!) 97.4 F (36.3 C)  TempSrc: Oral Oral Axillary Oral  SpO2: 92% 93%  94% 95%  Weight:  57 kg    Height:        Intake/Output Summary (Last 24 hours) at 09/24/2020 1534 Last data filed at 09/24/2020 0636 Gross per 24 hour  Intake 44 ml  Output 1350 ml  Net -1306 ml   Last 3 Weights 09/24/2020 09/23/2020 07/25/2020  Weight (lbs) 125 lb 9.6 oz 131 lb 11.2 oz 129 lb  Weight (kg) 56.972 kg 59.739 kg 58.514 kg     Body mass index is 23.73 kg/m.  General:  Well nourished, well developed, in no acute distress HEENT: normal Lymph: no adenopathy Neck: no JVD Endocrine:  No thryomegaly Vascular: No carotid bruits Cardiac:  irreg-irreg; no murmurs, gallops or rubs Lungs:  CTA b/l, no wheezing, rhonchi or rales  Abd: soft, nontender  Ext: no edema Musculoskeletal:  Kyphosis, age appropriate atrophy Skin: warm and dry  Neuro:  no gorss focal abnormalities noted Psych:  Normal affect   EKG:  The EKG was personally reviewed and demonstrates:    #1 AFib 164bpm, QTS is difficult (machine gets Qtc , LBBB, QRS 137) #2 is AFib 107bpm, LBBB, manually measured QRS , QT 380-484ms 507-551ms  Telemetry:  Telemetry was personally reviewed and demonstrates:   AFib 90's  Relevant CV Studies:  09/24/20: TTE IMPRESSIONS   1. Left ventricular ejection fraction, by  estimation, is 60 to 65%. The  left ventricle has normal function. The left ventricle has no regional  wall motion abnormalities. There is mild left ventricular hypertrophy.  Left ventricular diastolic parameters  are indeterminate.   2. Right ventricular systolic function is normal. The right ventricular  size is normal.   3. Left atrial size was severely dilated.   4. Right atrial size was moderately dilated.   5. The mitral valve is normal in structure. Mild mitral valve  regurgitation. No evidence of mitral stenosis.   6. The aortic valve is tricuspid. Aortic valve regurgitation is mild.  Mild to moderate aortic valve sclerosis/calcification is present, without  any evidence of aortic stenosis.   7. Aortic dilatation noted. There is mild dilatation of the ascending  aorta, measuring 38 mm.   8. IVC not well-visualized. Peak RV-RA gradient 23 mmHg.   9. The patient was in atrial fibrillation.  10. Technically difficult study with poor acoustic windows. Tec   Laboratory Data:  High Sensitivity Troponin:   Recent Labs  Lab 09/23/20 1443 09/23/20 1756 09/23/20 2034 09/23/20 2334 09/24/20 1303  TROPONINIHS 5 7 8 10 9      Chemistry Recent Labs  Lab 09/23/20 1443 09/24/20 0214  NA 138 140  K 3.8 4.1  CL 103 107  CO2 23 25  GLUCOSE 151* 117*  BUN 14 11  CREATININE 0.67 0.79  CALCIUM 9.4 8.9  GFRNONAA >60 >60  ANIONGAP 12 8    Recent Labs  Lab 09/24/20 0214  PROT 6.2*  ALBUMIN 3.2*  AST 22  ALT 13  ALKPHOS 40  BILITOT 1.8*   Hematology Recent Labs  Lab 09/23/20 1443 09/24/20 0214  WBC 11.6* 7.2  RBC 4.98 4.45  HGB 14.3 12.7  HCT 44.0 39.6  MCV 88.4 89.0  MCH 28.7 28.5  MCHC 32.5 32.1  RDW 15.1 15.0  PLT 307 279   BNP Recent Labs  Lab 09/23/20 1443  BNP 397.2*    DDimer  Recent Labs  Lab 09/23/20 2034  DDIMER 0.46     Radiology/Studies:  DG Chest Port 1 View  Result Date: 09/24/2020 CLINICAL DATA:  CHF EXAM: PORTABLE CHEST 1 VIEW  COMPARISON:  Chest radiograph from one day prior. FINDINGS: Low lung volumes. Stable cardiomediastinal silhouette with mild cardiomegaly. No pneumothorax. No pleural effusion. No overt pulmonary edema. Patchy bibasilar lung opacities, similar. IMPRESSION: 1. Stable mild cardiomegaly without overt pulmonary edema. 2. Stable low lung volumes with patchy bibasilar lung opacities, favor atelectasis. Electronically Signed   By: Delbert Phenix M.D.   On: 09/24/2020 07:27   DG Chest Port 1 View  Result Date: 09/23/2020 CLINICAL DATA:  Chest pain EXAM: PORTABLE CHEST 1 VIEW COMPARISON:  None. FINDINGS: Cardiomegaly. Small, layering bilateral pleural effusions. The visualized skeletal structures are unremarkable. IMPRESSION: Cardiomegaly with small, layering bilateral pleural effusions. Electronically Signed   By: Lauralyn Primes M.D.   On: 09/23/2020 15:23      Assessment and Plan:   1. Paroxysmal AFib     CHA2DS2Vasc is 3, on Savaysa, appropriately dosed for her CrCl  I measure her QT longer then the machine and despite accounting for her LBBB think QTc is a bit long for Tikosyn I do not think corneal deposits are an absolute indication for cessation of amiodarone and think this remains an option for her  I don't think she has other good AAD options  Unusual for her to fail DCCV given new onset AFib, her LA is described as severely dilated though measured 23mm Would not retry without AAD on board.  She is looking to have cataract surgery in the next 2-3 week,s not sure if she will need to interrupt her a/c for this, she has not been told yet.  Currently she is rate controlled with dig   Risk Assessment/Risk Scores:   For questions or updates, please contact [facility] Please consult [email or web address] for contact info under    Signed, Sheilah Pigeon, PA-C  09/24/2020 3:34 PM   EP Attending  Patient seen and examined. Asked to see to make recs regarding treatment of atrial fib. The patient  had been controlled for years on amiodarone but developed corneal deposits and the amio was stopped many months ago. She has developed atrial fib with a RVR. Her QT is too long for dofetilide. A strategy of rate control is most reasonable. Her HR's are improved but not quite as well as ideal. She will be started on low dose beta blocker. If she develops brady-tachy, she will need PPM sooner than later. If her HR cannot be controlled then AV node ablation and PPM insertion would be recommended, ikely as an outpatient.  Sharlot Gowda Mardelle Pandolfi,MD

## 2020-09-24 NOTE — Consult Note (Addendum)
Cardiology Consultation:   Patient ID: Tammy Walker MRN: [id number]; DOB: July 16, 1930  Admit date: 09/23/2020 Date of Consult: 09/24/2020  PCP:  Wilfrid Lund, PA   [facility]  Cardiologist:  Donato Schultz, MD  Advanced Practice Provider:  No care team member to display Electrophysiologist:  None  [phone]   Patient Profile:   Tammy Walker is a 87 y.o. female with a hx of paroxysmal atrial fibrillation, LBBB, aortic stenosis and chronic back pain who is being seen today for the evaluation of atrial fibrillation and chest pain at the request of Dr. Jonathon Bellows.  History of Present Illness:   Tammy Walker is a pleasant 87 year old female with past medical history of paroxysmal atrial fibrillation, LBBB, aortic stenosis and chronic back pain.  Patient had an episode of atrial fibrillation in 2002 and was placed on Coumadin and amiodarone therapy.  She did not have any recurrence of atrial fibrillation since then.  Previous echocardiogram showed EF 60%, mild aortic stenosis, mild AI, dilated ascending aorta, mild to moderate MR.  Since the onset of the COVID-19 pandemic, her Coumadin was switched to [facility]  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  Her amiodarone was subsequently discontinued.  She is not on any AV nodal blocking agent.  She does take Lipitor for cholesterol, amlodipine for blood pressure and Sayvasa for stroke protection.  She was last seen by Dr. Anne Fu via virtual visit in January 2022 at which time she was doing well.  She was established with [facility] ophthalmology on 08/20/2020 for evaluation of blurry visions, she was diagnosed with cataracts in both eyes and is pending surgery in April.  Patient was in her usual state of health until around 3 AM in the morning of 09/23/2020.  She woke up feeling fluttering sensation in her chest, severe "15 out 10" chest  pain, and shortness of breath.  She described the chest pain as a sharp pain that is worse with deep inspiration.  She has a prior history of very atypical chest discomfort for which she uses with hot water bottle compresses.  She held her hot water bottle for some time and her pain eased off.  She went to sleep however woke up around 6:30AM again with recurrence of the chest pain, palpitation and shortness of breath.  She eventually sought medical attention in [facility] ED. on arrival, she was noted to be in atrial fibrillation with RVR.  Chest x-ray showed cardiomegaly with small bilateral pleural effusion concerning for acute CHF.  BNP 397.  High-sensitivity troponin negative x2.  Patient was given a single dose of Lasix 40 mg IV Lasix.  Since the patient has been compliant with anticoagulation therapy, ER physician attempted cardioversion 3 times, of which were unsuccessful.  She was started on IV digoxin for rate control.  IV Cardizem was started as well, however stopped due to hypotension.  CRP mildly elevated at 11.2.  D-dimer negative.  TSH normal.  Cardiology service has been consulted for chest pain and atrial fibrillation.   Past Medical History:  Diagnosis Date   Allergy    Arrhythmia    Atrial fibrillation (HCC)    GERD (gastroesophageal reflux disease)    History of bone scan    Bone Density Scans 2017 & 2019 Dr. Dutch Quint    History of chest x-ray    Apart from large hiatus hernia, normal  heart, lungs and mediastinum. Per records from [facility]     History of CT scan of abdomen 10/24/2017   Gallstones within a thin-waled gallbladder, Per records from [facility]    History of echocardiogram    [facility]    History of EKG    Sinus rhythm, borderline 1st deg block, LAD completely changed axis, LBBB(old). Per records from [facility]    Hyperlipidemia    Hypertension    Insomnia    Left lower lobe  pneumonia 09/20/2015   Per records from [facility]    Nodule of right lung    Per records from [facility],    Osteoporosis    on fosamax   Osteoporosis    Reduced mobility     Past Surgical History:  Procedure Laterality Date   APPENDECTOMY  [address] [place]    CT SCAN     2017, 2018, 2019 -- re lungs see records    TUBAL LIGATION  1978   [facility], [place], [place]      Home Medications:  Prior to Admission medications   Medication Sig Start Date End Date Taking? Authorizing Provider  acetaminophen (TYLENOL) 500 MG tablet Take 500 mg by mouth every 4 (four) hours.    [provider]  amLODipine (NORVASC) 5 MG tablet Take 1 tablet (5 mg total) by mouth daily. 07/27/19   Ngetich, Dinah C, NP  atorvastatin (LIPITOR) 20 MG tablet Take 20 mg by mouth daily. 05/21/20   [provider]  bisacodyl (DULCOLAX) 5 MG EC tablet Take 10 mg by mouth daily. Two tablets (10 mg) daily    [provider]  calcium carbonate 1250 MG capsule Take 1,250 mg by mouth 2 (two) times daily with a meal.    [provider]  calcium-vitamin D (OSCAL WITH D) 500-200 MG-UNIT tablet Take 1 tablet by mouth 2 (two) times daily. 03/16/19   Ngetich, Dinah C, NP  cetirizine (ZYRTEC) 10 MG tablet Take 10 mg by mouth daily.    [provider]  denosumab (PROLIA) 60 MG/ML SOSY injection Inject 60 mg into the skin every 6 (six) months. Last injection in 01/2019    [provider]  diclofenac Sodium (VOLTAREN) 1 % GEL Apply 4 g topically as needed (back pain).    [provider]  edoxaban (SAVAYSA) 30 MG TABS tablet Take 1 tablet (30 mg total) by mouth daily. 07/25/20   [name], MD  HYDROcodone Bitartrate ER 15 MG CP12 Take 1 capsule by mouth every 12 (twelve) hours. 07/02/20   [provider]  oxyCODONE (ROXICODONE) 5 MG/5ML solution Take 2.5 mg by mouth every 4 (four) hours as needed for breakthrough pain.     [provider]  polyethylene glycol (MIRALAX / GLYCOLAX) 17 g packet Take 17 g by mouth daily.    [provider]  pregabalin (LYRICA) 25 MG capsule Take 25 mg by mouth 3 (three) times daily.    [provider]  shark liver oil-cocoa butter (PREPARATION H) 0.25-3-85.5 % suppository Place 1 suppository rectally as needed for hemorrhoids.    [provider]    Inpatient Medications: Scheduled Meds:  atorvastatin  20 mg Oral QHS   bisacodyl  10 mg Oral QHS   digoxin  0.25 mg Intravenous Q6H   [START ON 09/25/2020] digoxin  0.0625 mg Oral Daily   edoxaban  30 mg Oral QHS  oxyCODONE  10 mg Oral Q12H   polyethylene glycol  17 g Oral QHS   pregabalin  25 mg Oral TID   Continuous Infusions:  diltiazem (CARDIZEM) infusion Stopped (09/24/20 0425)   PRN Meds: acetaminophen **OR** acetaminophen, hydrocortisone  Allergies:   No Known Allergies  Social History:   Social History   Socioeconomic History   Marital status: Widowed    Spouse name: Not on file   Number of children: Not on file   Years of education: Not on file   Highest education level: Not on file  Occupational History   Not on file  Tobacco Use   Smoking status: Never Smoker   Smokeless tobacco: Never Used  Vaping Use   Vaping Use: Never used  Substance and Sexual Activity   Alcohol use: Yes    Comment: less than one drink a week   Drug use: No   Sexual activity: Never    Birth control/protection: None  Other Topics Concern   Not on file  Social History Narrative   Social History      Diet? I watch my intake of salt, sugar, and fat      Do you drink/eat things with caffeine? Yes       Marital status?      Widowed                               What year were you married? 1960      Do you live in a house, apartment, assisted living, condo, trailer, etc.? House       Is it one or more stories? one      How many persons live in your home? 2      Do you have any pets in  your home? (please list) yes 2 cats       Highest level of education completed? 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             yes                         Type & how often? Strength & Balance       Advanced Directives      Do you have a living will?  No       Do you have a DNR form?        No                           If not, do you want to discuss one? No       Do you have signed POA/HPOA for forms? Yes       Functional Status      Do you have difficulty bathing or dressing yourself? No       Do you have difficulty preparing food or eating? No       Do you have difficulty managing your medications? No       Do you have difficulty managing your finances? No       Do you have difficulty affording your medications? No       Social Determinants of Corporate investment banker Strain: Not on file  Food Insecurity: Not  on file  Transportation Needs: Not on file  Physical Activity: Not on file  Stress: Not on file  Social Connections: Not on file  Intimate Partner Violence: Not on file    Family History:    Family History  Problem Relation Age of Onset   Heart disease Mother    High blood pressure Mother    COPD Mother    Heart failure Mother    Heart disease Father    Heart disease Brother    Heart failure Brother    Hyperlipidemia Daughter    Hypertension Daughter      ROS:  Please see the history of present illness.   All other ROS reviewed and negative.     Physical Exam/Data:   Vitals:   09/23/20 2216 09/24/20 0033 09/24/20 0425 09/24/20 0700  BP: (!) 98/59 (!) 88/68 103/63 (!) 99/50  Pulse: 82 83 90 95  Resp: 18 15 16 16   Temp: 97.9 F (36.6 C) 97.8 F (36.6 C) 97.6 F (36.4 C) 99.1 F (37.3 C)  TempSrc: Oral Oral Oral Axillary  SpO2: 92% 92% 93% 94%  Weight:   57 kg   Height:        Intake/Output Summary (Last 24 hours) at 09/24/2020 0903 Last  data filed at 09/24/2020 0636 Gross per 24 hour  Intake 544 ml  Output 1350 ml  Net -806 ml   Last 3 Weights 09/24/2020 09/23/2020 07/25/2020  Weight (lbs) 125 lb 9.6 oz 131 lb 11.2 oz 129 lb  Weight (kg) 56.972 kg 59.739 kg 58.514 kg     Body mass index is 23.73 kg/m.  General:  Well nourished, well developed, in no acute distress HEENT: normal Lymph: no adenopathy Neck: no JVD Endocrine:  No thryomegaly Vascular: No carotid bruits; FA pulses 2+ bilaterally without bruits  Cardiac:  normal S1, S2; irregularly irregular; no murmur  Lungs:  clear to auscultation bilaterally, no wheezing, rhonchi or rales.  Mild diminished breath sounds to bilateral bases Abd: soft, nontender, no hepatomegaly  Ext: no edema Musculoskeletal:  No deformities, BUE and BLE strength normal and equal Skin: warm and dry  Neuro:  CNs 2-12 intact, no focal abnormalities noted Psych:  Normal affect   EKG:  The EKG was personally reviewed and demonstrates: Atrial fibrillation with RVR, underlying left bundle branch block Telemetry:  Telemetry was personally reviewed and demonstrates: Atrial fibrillation, heart rate between 80s to 110s  Relevant CV Studies:  N/A  Laboratory Data:  High Sensitivity Troponin:   Recent Labs  Lab 09/23/20 1443 09/23/20 1756 09/23/20 2034 09/23/20 2334  TROPONINIHS 5 7 8 10      Chemistry Recent Labs  Lab 09/23/20 1443 09/24/20 0214  NA 138 140  K 3.8 4.1  CL 103 107  CO2 23 25  GLUCOSE 151* 117*  BUN 14 11  CREATININE 0.67 0.79  CALCIUM 9.4 8.9  GFRNONAA >60 >60  ANIONGAP 12 8    Recent Labs  Lab 09/24/20 0214  PROT 6.2*  ALBUMIN 3.2*  AST 22  ALT 13  ALKPHOS 40  BILITOT 1.8*   Hematology Recent Labs  Lab 09/23/20 1443 09/24/20 0214  WBC 11.6* 7.2  RBC 4.98 4.45  HGB 14.3 12.7  HCT 44.0 39.6  MCV 88.4 89.0  MCH 28.7 28.5  MCHC 32.5 32.1  RDW 15.1 15.0  PLT 307 279   BNP Recent Labs  Lab 09/23/20 1443  BNP 397.2*    DDimer  Recent  Labs  Lab 09/23/20 2034  DDIMER 0.46     Radiology/Studies:  DG Chest Port 1 View  Result Date: 09/24/2020 CLINICAL DATA:  CHF EXAM: PORTABLE CHEST 1 VIEW COMPARISON:  Chest radiograph from one day prior. FINDINGS: Low lung volumes. Stable cardiomediastinal silhouette with mild cardiomegaly. No pneumothorax. No pleural effusion. No overt pulmonary edema. Patchy bibasilar lung opacities, similar. IMPRESSION: 1. Stable mild cardiomegaly without overt pulmonary edema. 2. Stable low lung volumes with patchy bibasilar lung opacities, favor atelectasis. Electronically Signed   By: Delbert Phenix M.D.   On: 09/24/2020 07:27   DG Chest Port 1 View  Result Date: 09/23/2020 CLINICAL DATA:  Chest pain EXAM: PORTABLE CHEST 1 VIEW COMPARISON:  None. FINDINGS: Cardiomegaly. Small, layering bilateral pleural effusions. The visualized skeletal structures are unremarkable. IMPRESSION: Cardiomegaly with small, layering bilateral pleural effusions. Electronically Signed   By: Lauralyn Primes M.D.   On: 09/23/2020 15:23     Assessment and Plan:   Atrial fibrillation with RVR  -Patient moved here from [place], she had a single episode of atrial fibrillation in 2002 and was placed on Coumadin and amiodarone at the time.  She was not on any other AV nodal blocking agent.  Since the start of the COVID-19 pandemic, she was switched from Coumadin to [facility].    -With the help of albuterol, she has not had recurrence of atrial fibrillation since 2002 until this admission.  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  -Patient had a sudden onset of palpitations and chest pain that woke her up from sleep around 3 AM on 09/23/2020.  Since that, she was placed on digoxin and the IV diltiazem, heart rate improved and her palpitation had chest discomfort completely resolved despite the fact that she is still in atrial fibrillation.  -She takes  her anticoagulation at home religiously without missing a single dose, ED physician attempted to cardiovert her 3 times without success.  -TSH normal. Pending echocardiogram to look at EF, valve and left atrial size  -although symptom started around 3AM yesterday, however patient does not have symptom unless her HR is elevated. Last EKG prior to this admission was in 2020.   -continue IV digoxin. Unable to add rate controlling agent given hypotension. Cannot do amiodarone given prior eye toxicity. LBBB is not the absolute contraindication for tikosyn, but likely will make dosing of tikosyn difficult. Nevertheless, given 3 failed DCCV in the emergency room, Tikosyn likely stand the best chance. Will obtain EKG to check the current QTc. Once SBP improve to >105, consider adding metoprolol tartrate 12.5mg  BID back.   Chest pain: maybe related to tachycardia, symptom worsened with deep inspiration initially. If symptom recurs later despite controlling afib, may consider myoview as outpatient    Mild CHF  - CXR shows mild bilateral pleural effusion, likely tachy-mediated  - patient received a single dose of IV lasix , on exam, still has a small pleural effusion, but majority of lung is clear and she does not appears to be significant volume overloaded, given borderline BP, recommend hold off on further lasix dose.   LBBB  Aortic stenosis: Pending echocardiogram  HTN: home amlodipine held  Hyperlipidemia: on lipitor  Chronic back pain   Risk Assessment/Risk Scores:        New York Heart Association (NYHA) Functional Class NYHA Class II  CHA2DS2-VASc Score = 4  This indicates a 4.8% annual risk of stroke. The patient's score is based upon: CHF History:  No HTN History: Yes Diabetes History: No Stroke History: No Vascular Disease History: No Age Score: 2 Gender Score: 1          For questions or updates, please contact [facility] Please consult [email or web address] for contact  info under    Ramond DialSigned, Hao Meng, [place]  09/24/2020 9:03 AM   Personally seen and examined. Agree with APP above with the following comments: Briefly 87 [place] and LBBB previously controlled with amiodarone but with amiodarone eye complications who presented with AF RVR.  Patient notes that her CP resolved; had BB IV diltiazem, Diuretics, digoxin and DCCV X 3 through her short course. Exam notable for irregular heart rhythm and I/VI holosystolic murmur Labs notable for stable creatinine Personally reviewed relevant tests; echo is done and showed mild to moderate MR with wall hugging jet, there is TR and mild AI Would recommend continuing digoxin.  Reached out to EP team- possible Tikosyn load.  LV is under filled- would stop further diuresis at this time. -Unable to correlated elevation in CRP with cardiac cause. -May end up leave off of home norvasc  Riley LamMahesh Angelee Bahr, MD Cardiologist [facility]  [facility]  [address] [place], [place] [address] [phone]  10:01 AM

## 2020-09-24 NOTE — Progress Notes (Signed)
PROGRESS NOTE    Tammy Walker  OHY:[id number] DOB: [birth year] DOA: 09/23/2020 PCP: Wilfrid Lund, PA   Chief Complaint  Patient presents with  . Chest Pain   Brief Narrative: 87 year old female with history of PAF, chronic pain admitted with chest pain, palpitation that is started early in the midnight 3/27.  Took pain medication without resolution, then seen in the [facility] found to have A. fib with RVR chest x-ray concerning for CHF with elevated BNP 397 mild leukocytosis troponin negative Covid test was negative, was given IV Lasix, placed on digoxin and admitted  Subjective: Seen and examined this morning.  She is hard of hearing.  Her daughter is at the bedside.   Assessment & Plan:  PAF with RVR: Continue to monitor in telemetry, appreciate cardiology input continue digoxin, continue her home anticoagulation with edoxaban.  Follow-up echo-shows mild to moderate MR, there is TR and mild AI, TSH stable. EP consult being contemplated for possible Tikosyn.Previously on amiodarone but  discontinued after having high deposits in the eye.  Chest pain with negative troponin likely tachycardia induced.  Plan per cardiology.  D-dimer is negative.  Respiratory status is stable.  Mild CHF status post IV Lasix symptoms improved.  Echo pending likely from tachycardia.  Holding further Lasix.  Hypertension: Holding home amlodipine  HLD continue Lipitor  Chronic back pain: Continue hydrocodone, pregabalin.  Diet Order            Diet Heart Room service appropriate? Yes; Fluid consistency: Thin  Diet effective now                Patient's Body mass index is 23.73 kg/m.  DVT prophylaxis:  Code Status:   Code Status: Full Code  Family Communication: plan of care discussed with patient at bedside.  Status is: Inpatient Remains inpatient appropriate because:Ongoing diagnostic testing needed not appropriate for outpatient work up and Inpatient level of care appropriate due to  severity of illness  Dispo: The patient is from: Home              Anticipated d/c is to: Home              Patient currently is not medically stable to d/c.   Difficult to place patient No  Unresulted Labs (From admission, onward)         None      Medications reviewed:  Scheduled Meds: . atorvastatin  20 mg Oral QHS  . bisacodyl  10 mg Oral QHS  . digoxin  0.25 mg Intravenous Q6H  . [START ON 09/25/2020] digoxin  0.0625 mg Oral Daily  . edoxaban  30 mg Oral QHS  . oxyCODONE  10 mg Oral Q12H  . polyethylene glycol  17 g Oral QHS  . pregabalin  25 mg Oral TID   Continuous Infusions: . diltiazem (CARDIZEM) infusion Stopped (09/24/20 0425)    Consultants:see note  Procedures:see note  Antimicrobials: Anti-infectives (From admission, onward)   None     Culture/Microbiology No results found for: SDES, SPECREQUEST, CULT, REPTSTATUS  Other culture-see note  Objective: Vitals: Today's Vitals   09/24/20 0033 09/24/20 0425 09/24/20 0700 09/24/20 0856  BP: (!) 88/68 103/63 (!) 99/50   Pulse: 83 90 95   Resp: 15 16 16    Temp: 97.8 F (36.6 C) 97.6 F (36.4 C) 99.1 F (37.3 C)   TempSrc: Oral Oral Axillary   SpO2: 92% 93% 94%   Weight:  57 kg    Height:  PainSc:    0-No pain    Intake/Output Summary (Last 24 hours) at 09/24/2020 0902 Last data filed at 09/24/2020 0636 Gross per 24 hour  Intake 544 ml  Output 1350 ml  Net -806 ml   Filed Weights   09/23/20 1433 09/24/20 0425  Weight: 59.7 kg 57 kg   Weight change:   Intake/Output from previous day: 03/27 0701 - 03/28 0700 In: 544 [I.V.:44; IV Piggyback:500] Out: 1350 [Urine:1350] Intake/Output this shift: No intake/output data recorded. Filed Weights   09/23/20 1433 09/24/20 0425  Weight: 59.7 kg 57 kg    Examination: General exam: AAOx3, hard of hearing, not in acute distress,NAD, weak appearing. HEENT:Oral mucosa moist, Ear/Nose WNL grossly,dentition normal. Respiratory system: bilaterally  diminished,no use of accessory muscle, non tender. Cardiovascular system: S1 & S2 +, irregular, No JVD. Gastrointestinal system: Abdomen soft, NT,ND, BS+. Nervous System:Alert, awake, moving extremities and grossly nonfocal Extremities: No edema, distal peripheral pulses palpable.  Skin: No rashes,no icterus. MSK: Normal muscle bulk,tone, power  Data Reviewed: I have personally reviewed following labs and imaging studies CBC: Recent Labs  Lab 09/23/20 1443 09/24/20 0214  WBC 11.6* 7.2  NEUTROABS  --  4.4  HGB 14.3 12.7  HCT 44.0 39.6  MCV 88.4 89.0  PLT 307 279   Basic Metabolic Panel: Recent Labs  Lab 09/23/20 1443 09/23/20 1756 09/24/20 0214  NA 138  --  140  K 3.8  --  4.1  CL 103  --  107  CO2 23  --  25  GLUCOSE 151*  --  117*  BUN 14  --  11  CREATININE 0.67  --  0.79  CALCIUM 9.4  --  8.9  MG  --  2.1  --    GFR: Estimated Creatinine Clearance: 37.4 mL/min (by C-G formula based on SCr of 0.79 mg/dL). Liver Function Tests: Recent Labs  Lab 09/24/20 0214  AST 22  ALT 13  ALKPHOS 40  BILITOT 1.8*  PROT 6.2*  ALBUMIN 3.2*   No results for input(s): LIPASE, AMYLASE in the last 168 hours. No results for input(s): AMMONIA in the last 168 hours. Coagulation Profile: Recent Labs  Lab 09/23/20 1443  INR 1.1   Cardiac Enzymes: No results for input(s): CKTOTAL, CKMB, CKMBINDEX, TROPONINI in the last 168 hours. BNP (last 3 results) No results for input(s): PROBNP in the last 8760 hours. HbA1C: No results for input(s): HGBA1C in the last 72 hours. CBG: No results for input(s): GLUCAP in the last 168 hours. Lipid Profile: No results for input(s): CHOL, HDL, LDLCALC, TRIG, CHOLHDL, LDLDIRECT in the last 72 hours. Thyroid Function Tests: Recent Labs    09/23/20 2034  TSH 1.060   Anemia Panel: No results for input(s): VITAMINB12, FOLATE, FERRITIN, TIBC, IRON, RETICCTPCT in the last 72 hours. Sepsis Labs: No results for input(s): PROCALCITON,  LATICACIDVEN in the last 168 hours.  Recent Results (from the past 240 hour(s))  SARS CORONAVIRUS 2 (TAT 6-24 HRS) Nasopharyngeal Nasopharyngeal Swab     Status: None   Collection Time: 09/23/20  3:18 PM   Specimen: Nasopharyngeal Swab  Result Value Ref Range Status   SARS Coronavirus 2 NEGATIVE NEGATIVE Final    Comment: (NOTE) SARS-CoV-2 target nucleic acids are NOT DETECTED.  The SARS-CoV-2 RNA is generally detectable in upper and lower respiratory specimens during the acute phase of infection. Negative results do not preclude SARS-CoV-2 infection, do not rule out co-infections with other pathogens, and should not be used as the sole basis  for treatment or other patient management decisions. Negative results must be combined with clinical observations, patient history, and epidemiological information. The expected result is Negative.  Fact Sheet for Patients: HairSlick.no  Fact Sheet for Healthcare Providers: quierodirigir.com  This test is not yet approved or cleared by the [place] FDA and  has been authorized for detection and/or diagnosis of SARS-CoV-2 by FDA under an Emergency Use Authorization (EUA). This EUA will remain  in effect (meaning this test can be used) for the duration of the COVID-19 declaration under Se ction 564(b)(1) of the Act, 21 U.S.C. section 360bbb-3(b)(1), unless the authorization is terminated or revoked sooner.  Performed at [facility], [address], [place], [place] [address]   Radiology Studies: DG Chest Port 1 View  Result Date: 09/24/2020 CLINICAL DATA:  CHF EXAM: PORTABLE CHEST 1 VIEW COMPARISON:  Chest radiograph from one day prior. FINDINGS: Low lung volumes. Stable cardiomediastinal silhouette with mild cardiomegaly. No pneumothorax. No pleural effusion. No overt pulmonary edema. Patchy bibasilar lung opacities, similar. IMPRESSION: 1. Stable mild cardiomegaly without  overt pulmonary edema. 2. Stable low lung volumes with patchy bibasilar lung opacities, favor atelectasis. Electronically Signed   By: Delbert Phenix M.D.   On: 09/24/2020 07:27   DG Chest Port 1 View  Result Date: 09/23/2020 CLINICAL DATA:  Chest pain EXAM: PORTABLE CHEST 1 VIEW COMPARISON:  None. FINDINGS: Cardiomegaly. Small, layering bilateral pleural effusions. The visualized skeletal structures are unremarkable. IMPRESSION: Cardiomegaly with small, layering bilateral pleural effusions. Electronically Signed   By: Lauralyn Primes M.D.   On: 09/23/2020 15:23     LOS: 1 day   Lanae Boast, MD [facility]  09/24/2020, 9:02 AM

## 2020-09-24 NOTE — Care Management (Signed)
Case Manager has requested Benefits check for Tikosyn, in anticipation of patient possibly receiving. TOC Team will continue to monitor.  Vance Peper, RN BSN Case Manager

## 2020-09-24 NOTE — Progress Notes (Signed)
  Echocardiogram 2D Echocardiogram has been performed.  Janalyn Harder 09/24/2020, 10:17 AM

## 2020-09-24 NOTE — TOC Benefit Eligibility Note (Signed)
Transition of Care [facility]) Benefit Eligibility Note    Patient Details  Name: KIMORAH RIDOLFI MRN: [id number] Date of Birth: 12/07/1930   Medication/Dose: Dofetilide Genric Brand for Tikosyn  Covered?: Yes  Tier:  (Tier 1)  Prescription Coverage Preferred Pharmacy: [facility], Mail Order Pharmacy,[facility] with Person/Company/Phone Number:: Dellie Catholic. [facility]. [phone]  Co-Pay: $5.00 for 30 day supply &10.00 for 90 day supply  Prior Approval: No  Deductible:  (no deductible)       Renie Ora Phone Number: 09/24/2020, 2:05 PM

## 2020-09-25 DIAGNOSIS — I4891 Unspecified atrial fibrillation: Secondary | ICD-10-CM | POA: Diagnosis not present

## 2020-09-25 LAB — BASIC METABOLIC PANEL
Anion gap: 7 (ref 5–15)
BUN: 14 mg/dL (ref 8–23)
CO2: 23 mmol/L (ref 22–32)
Calcium: 8.6 mg/dL — ABNORMAL LOW (ref 8.9–10.3)
Chloride: 107 mmol/L (ref 98–111)
Creatinine, Ser: 0.85 mg/dL (ref 0.44–1.00)
GFR, Estimated: >60 mL/min (ref >60)
Glucose, Bld: 108 mg/dL — ABNORMAL HIGH (ref 70–99)
Potassium: 3.9 mmol/L (ref 3.5–5.1)
Sodium: 137 mmol/L (ref 135–145)

## 2020-09-25 LAB — MAGNESIUM: Magnesium: 2.2 mg/dL (ref 1.7–2.4)

## 2020-09-25 MED ORDER — ONDANSETRON HCL 4 MG/2ML IJ SOLN
4.0000 mg | Freq: Four times a day (QID) | INTRAMUSCULAR | Status: DC | PRN
  Administered 2020-09-25: 4 mg via INTRAVENOUS
  Filled 2020-09-25: qty 2

## 2020-09-25 MED ORDER — ACETAMINOPHEN 500 MG PO TABS
500.0000 mg | ORAL_TABLET | Freq: Three times a day (TID) | ORAL | Status: DC
  Administered 2020-09-25 – 2020-09-27 (×7): 500 mg via ORAL
  Filled 2020-09-25 (×7): qty 1

## 2020-09-25 MED ORDER — ENSURE ENLIVE PO LIQD
237.0000 mL | Freq: Two times a day (BID) | ORAL | Status: DC
  Administered 2020-09-25 – 2020-09-26 (×3): 237 mL via ORAL

## 2020-09-25 MED ORDER — METOPROLOL SUCCINATE ER 25 MG PO TB24
25.0000 mg | ORAL_TABLET | Freq: Every day | ORAL | Status: DC
  Administered 2020-09-25 – 2020-09-27 (×3): 25 mg via ORAL
  Filled 2020-09-25 (×3): qty 1

## 2020-09-25 NOTE — Progress Notes (Addendum)
Progress Note  Patient Name: Tammy Walker Date of Encounter: 09/25/2020  [facility] Cardiologist: Donato Schultz, MD   Subjective   Slept quite well, no complaints  Inpatient Medications    Scheduled Meds:  acetaminophen  500 mg Oral TID   atorvastatin  20 mg Oral QHS   bisacodyl  10 mg Oral QHS   digoxin  0.0625 mg Oral Daily   edoxaban  30 mg Oral QHS   feeding supplement  237 mL Oral BID BM   metoprolol succinate  25 mg Oral Daily   oxyCODONE  10 mg Oral Q12H   polyethylene glycol  17 g Oral QHS   pregabalin  25 mg Oral TID   Continuous Infusions:  diltiazem (CARDIZEM) infusion Stopped (09/24/20 0425)   PRN Meds: hydrocortisone, ondansetron (ZOFRAN) IV   Vital Signs    Vitals:   09/24/20 2323 09/25/20 0423 09/25/20 0746 09/25/20 0913  BP: 114/69 101/68 97/81 109/69  Pulse: 98 (!) 102 89 (!) 101  Resp: 20 17 18    Temp: 97.9 F (36.6 C) 97.7 F (36.5 C) (!) 97.4 F (36.3 C)   TempSrc: Oral Oral Oral   SpO2: 94% 94% 93%   Weight:  56.4 kg    Height:       No intake or output data in the 24 hours ending 09/25/20 0927 Last 3 Weights 09/25/2020 09/24/2020 09/23/2020  Weight (lbs) 124 lb 5.4 oz 125 lb 9.6 oz 131 lb 11.2 oz  Weight (kg) 56.4 kg 56.972 kg 59.739 kg      Telemetry    AFib 90's-110's - Personally Reviewed  ECG    No new ekgs - Personally Reviewed  Physical Exam   GEN: No acute distress.   Neck: No JVD Cardiac: irreg-irreg, no murmurs, rubs, or gallops.  Respiratory: CTA b/l. GI: Soft, nontender, non-distended  MS: No edema; kyphosis, advanced atrophy Neuro:  Nonfocal  Psych: Normal affect   Labs    High Sensitivity Troponin:   Recent Labs  Lab 09/23/20 1443 09/23/20 1756 09/23/20 2034 09/23/20 2334 09/24/20 1303  TROPONINIHS 5 7 8 10 9       Chemistry Recent Labs  Lab 09/23/20 1443 09/24/20 0214 09/25/20 0249  NA 138 140 137  K 3.8 4.1 3.9  CL 103 107 107  CO2 23 25 23   GLUCOSE 151* 117* 108*  BUN 14 11 14    CREATININE 0.67 0.79 0.85  CALCIUM 9.4 8.9 8.6*  PROT  --  6.2*  --   ALBUMIN  --  3.2*  --   AST  --  22  --   ALT  --  13  --   ALKPHOS  --  40  --   BILITOT  --  1.8*  --   GFRNONAA >60 >60 >60  ANIONGAP 12 8 7      Hematology Recent Labs  Lab 09/23/20 1443 09/24/20 0214  WBC 11.6* 7.2  RBC 4.98 4.45  HGB 14.3 12.7  HCT 44.0 39.6  MCV 88.4 89.0  MCH 28.7 28.5  MCHC 32.5 32.1  RDW 15.1 15.0  PLT 307 279    BNP Recent Labs  Lab 09/23/20 1443  BNP 397.2*     DDimer  Recent Labs  Lab 09/23/20 2034  DDIMER 0.46     Radiology    DG Chest Port 1 View Result Date: 09/24/2020 CLINICAL DATA:  CHF EXAM: PORTABLE CHEST 1 VIEW COMPARISON:  Chest radiograph from one day prior. FINDINGS: Low lung volumes. Stable cardiomediastinal  silhouette with mild cardiomegaly. No pneumothorax. No pleural effusion. No overt pulmonary edema. Patchy bibasilar lung opacities, similar. IMPRESSION: 1. Stable mild cardiomegaly without overt pulmonary edema. 2. Stable low lung volumes with patchy bibasilar lung opacities, favor atelectasis. Electronically Signed   By: Delbert Phenix M.D.   On: 09/24/2020 07:27    Cardiac Studies   09/24/20: TTE IMPRESSIONS   1. Left ventricular ejection fraction, by estimation, is 60 to 65%. The  left ventricle has normal function. The left ventricle has no regional  wall motion abnormalities. There is mild left ventricular hypertrophy.  Left ventricular diastolic parameters  are indeterminate.   2. Right ventricular systolic function is normal. The right ventricular  size is normal.   3. Left atrial size was severely dilated.   4. Right atrial size was moderately dilated.   5. The mitral valve is normal in structure. Mild mitral valve  regurgitation. No evidence of mitral stenosis.   6. The aortic valve is tricuspid. Aortic valve regurgitation is mild.  Mild to moderate aortic valve sclerosis/calcification is present, without  any evidence of aortic  stenosis.   7. Aortic dilatation noted. There is mild dilatation of the ascending  aorta, measuring 38 mm.   8. IVC not well-visualized. Peak RV-RA gradient 23 mmHg.   9. The patient was in atrial fibrillation.  10. Technically difficult study with poor acoustic windows. Tec   Patient Profile     87 y.o. 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 Paroxysmal AFib     CHA2DS2Vasc is 3, on Savaysa appropriately dosed     Rates better, not completely controlled     Will add small Toprol dose, BP limits      Dr. Ladona Ridgel discussed with the patient and her daughter recommended strategy for rate control going forward, they are in agreement if able to achieve.  Mobilize today  Further as per attending cardiology/IM teams  2. CP 3. HTN      For questions or updates, please contact [facility] Please consult [email or web address] for contact info under     Signed, Sheilah Pigeon, PA-C  09/25/2020, 9:27 AM    EP Attending  Patient seen and examined. Agree with the findings as noted above. The patient now has better control of her VR though still a little fast. I have recommended she start low dose toprol.   Sharlot Gowda Jomel Whittlesey,MD

## 2020-09-25 NOTE — Progress Notes (Signed)
Progress Note  Patient Name: PAMILA MENDIBLES Date of Encounter: 09/25/2020  [facility] Cardiologist: Donato Schultz, MD   Subjective   No acute overnight events. Rates has been controlled < 120 bpm outside of one episodes (tooth brushing?).  Inpatient Medications    Scheduled Meds: . atorvastatin  20 mg Oral QHS  . bisacodyl  10 mg Oral QHS  . digoxin  0.0625 mg Oral Daily  . edoxaban  30 mg Oral QHS  . metoprolol succinate  25 mg Oral Daily  . oxyCODONE  10 mg Oral Q12H  . polyethylene glycol  17 g Oral QHS  . pregabalin  25 mg Oral TID   Continuous Infusions: . diltiazem (CARDIZEM) infusion Stopped (09/24/20 0425)   PRN Meds: acetaminophen **OR** acetaminophen, hydrocortisone, ondansetron (ZOFRAN) IV   Vital Signs    Vitals:   09/24/20 2024 09/24/20 2323 09/25/20 0423 09/25/20 0746  BP: 108/66 114/69 101/68 97/81  Pulse: 99 98 (!) 102 89  Resp: 18 20 17 18   Temp: 97.7 F (36.5 C) 97.9 F (36.6 C) 97.7 F (36.5 C) (!) 97.4 F (36.3 C)  TempSrc: Oral Oral Oral Oral  SpO2: 94% 94% 94% 93%  Weight:   56.4 kg   Height:       No intake or output data in the 24 hours ending 09/25/20 0809 Last 3 Weights 09/25/2020 09/24/2020 09/23/2020  Weight (lbs) 124 lb 5.4 oz 125 lb 9.6 oz 131 lb 11.2 oz  Weight (kg) 56.4 kg 56.972 kg 59.739 kg      Telemetry    Atrial fibrillation with rates baseline rates in the 90's to 110's. Did have an episodes as high as the 150's to 170's this morning. - Personally Reviewed  ECG    No new ECG tracing today. - Personally Reviewed  Physical Exam   GEN: No acute distress.   Neck: No JVD Cardiac:Irregularly irregular tachycardia with 2/6 holosystolc murmur, no rubs, or gallops.  Respiratory: Clear to auscultation bilaterally. GI: Soft, nontender, non-distended  MS: No edema; No deformity. Neuro:  Nonfocal  Psych: Normal affect   Labs    High Sensitivity Troponin:   Recent Labs  Lab 09/23/20 1443 09/23/20 1756  09/23/20 2034 09/23/20 2334 09/24/20 1303  TROPONINIHS 5 7 8 10 9       Chemistry Recent Labs  Lab 09/23/20 1443 09/24/20 0214 09/25/20 0249  NA 138 140 137  K 3.8 4.1 3.9  CL 103 107 107  CO2 23 25 23   GLUCOSE 151* 117* 108*  BUN 14 11 14   CREATININE 0.67 0.79 0.85  CALCIUM 9.4 8.9 8.6*  PROT  --  6.2*  --   ALBUMIN  --  3.2*  --   AST  --  22  --   ALT  --  13  --   ALKPHOS  --  40  --   BILITOT  --  1.8*  --   GFRNONAA >60 >60 >60  ANIONGAP 12 8 7      Hematology Recent Labs  Lab 09/23/20 1443 09/24/20 0214  WBC 11.6* 7.2  RBC 4.98 4.45  HGB 14.3 12.7  HCT 44.0 39.6  MCV 88.4 89.0  MCH 28.7 28.5  MCHC 32.5 32.1  RDW 15.1 15.0  PLT 307 279    BNP Recent Labs  Lab 09/23/20 1443  BNP 397.2*     DDimer  Recent Labs  Lab 09/23/20 2034  DDIMER 0.46     Radiology    DG Chest [facility]  1 View  Result Date: 09/24/2020 CLINICAL DATA:  CHF EXAM: PORTABLE CHEST 1 VIEW COMPARISON:  Chest radiograph from one day prior. FINDINGS: Low lung volumes. Stable cardiomediastinal silhouette with mild cardiomegaly. No pneumothorax. No pleural effusion. No overt pulmonary edema. Patchy bibasilar lung opacities, similar. IMPRESSION: 1. Stable mild cardiomegaly without overt pulmonary edema. 2. Stable low lung volumes with patchy bibasilar lung opacities, favor atelectasis. Electronically Signed   By: Delbert Phenix M.D.   On: 09/24/2020 07:27   DG Chest Port 1 View  Result Date: 09/23/2020 CLINICAL DATA:  Chest pain EXAM: PORTABLE CHEST 1 VIEW COMPARISON:  None. FINDINGS: Cardiomegaly. Small, layering bilateral pleural effusions. The visualized skeletal structures are unremarkable. IMPRESSION: Cardiomegaly with small, layering bilateral pleural effusions. Electronically Signed   By: Lauralyn Primes M.D.   On: 09/23/2020 15:23   ECHOCARDIOGRAM COMPLETE  Result Date: 09/24/2020    ECHOCARDIOGRAM REPORT   Patient Name:   KATALEAH BEJAR Date of Exam: 09/24/2020 Medical Rec #:   [id number]      Height:       61.0 in Accession #:    [id number]     Weight:       125.6 lb Date of Birth:  1930/12/30      BSA:          1.550 m Patient Age:    89 years       BP:           99/50 mmHg Patient Gender: F              HR:           105 bpm. Exam Location:  Inpatient Procedure: 2D Echo, Cardiac Doppler and Color Doppler Indications:    R07.9* Chest pain, unspecified  History:        Patient has no prior history of Echocardiogram examinations.                 CHF, Abnormal ECG, Arrythmias:Atrial Fibrillation; Risk                 Factors:Dyslipidemia and Hypertension.  Sonographer:    Sheralyn Boatman RDCS Referring Phys: [phone] Meryle Ready [facility]  Sonographer Comments: Technically difficult study due to poor echo windows and suboptimal subcostal window. IMPRESSIONS  1. Left ventricular ejection fraction, by estimation, is 60 to 65%. The left ventricle has normal function. The left ventricle has no regional wall motion abnormalities. There is mild left ventricular hypertrophy. Left ventricular diastolic parameters are indeterminate.  2. Right ventricular systolic function is normal. The right ventricular size is normal.  3. Left atrial size was severely dilated.  4. Right atrial size was moderately dilated.  5. The mitral valve is normal in structure. Mild mitral valve regurgitation. No evidence of mitral stenosis.  6. The aortic valve is tricuspid. Aortic valve regurgitation is mild. Mild to moderate aortic valve sclerosis/calcification is present, without any evidence of aortic stenosis.  7. Aortic dilatation noted. There is mild dilatation of the ascending aorta, measuring 38 mm.  8. IVC not well-visualized. Peak RV-RA gradient 23 mmHg.  9. The patient was in atrial fibrillation. 10. Technically difficult study with poor acoustic windows. Tec FINDINGS  Left Ventricle: Left ventricular ejection fraction, by estimation, is 60 to 65%. The left ventricle has normal function. The left ventricle has no regional  wall motion abnormalities. The left ventricular internal cavity size was normal in size. There is  mild left ventricular hypertrophy. Left ventricular diastolic parameters are indeterminate.  Right Ventricle: The right ventricular size is normal. No increase in right ventricular wall thickness. Right ventricular systolic function is normal. Left Atrium: Left atrial size was severely dilated. Right Atrium: Right atrial size was moderately dilated. Pericardium: There is no evidence of pericardial effusion. Mitral Valve: The mitral valve is normal in structure. Mild mitral annular calcification. Mild mitral valve regurgitation. No evidence of mitral valve stenosis. Tricuspid Valve: The tricuspid valve is normal in structure. Tricuspid valve regurgitation is mild. Aortic Valve: The aortic valve is tricuspid. Aortic valve regurgitation is mild. Aortic regurgitation PHT measures 606 msec. Mild to moderate aortic valve sclerosis/calcification is present, without any evidence of aortic stenosis. Pulmonic Valve: The pulmonic valve was normal in structure. Pulmonic valve regurgitation is not visualized. Aorta: The aortic root is normal in size and structure and aortic dilatation noted. There is mild dilatation of the ascending aorta, measuring 38 mm. Venous: The inferior vena cava was not well visualized. IAS/Shunts: No atrial level shunt detected by color flow Doppler.  LEFT VENTRICLE PLAX 2D LVIDd:         3.40 cm LVIDs:         1.80 cm LV PW:         1.30 cm LV IVS:        1.50 cm LVOT diam:     2.40 cm LV SV:         50 LV SV Index:   32 LVOT Area:     4.52 cm  LV Volumes (MOD) LV vol d, MOD A2C: 24.3 ml LV vol d, MOD A4C: 48.8 ml LV vol s, MOD A2C: 15.7 ml LV vol s, MOD A4C: 20.2 ml LV SV MOD A2C:     8.6 ml LV SV MOD A4C:     48.8 ml LV SV MOD BP:      19.4 ml RIGHT VENTRICLE RV S prime:     6.16 cm/s TAPSE (M-mode): 0.7 cm LEFT ATRIUM             Index       RIGHT ATRIUM           Index LA diam:        3.20 cm 2.06  cm/m  RA Area:     11.00 cm LA Vol (A2C):   29.8 ml 19.23 ml/m RA Volume:   19.70 ml  12.71 ml/m LA Vol (A4C):   55.6 ml 35.87 ml/m LA Biplane Vol: 41.7 ml 26.90 ml/m  AORTIC VALVE LVOT Vmax:   70.80 cm/s LVOT Vmean:  54.000 cm/s LVOT VTI:    0.111 m AI PHT:      606 msec  AORTA Ao Root diam: 3.50 cm Ao Asc diam:  3.80 cm MITRAL VALVE                TRICUSPID VALVE MV Area (PHT): 4.17 cm     TR Peak grad:   23.0 mmHg MV Decel Time: 182 msec     TR Vmax:        240.00 cm/s MV E velocity: 116.00 cm/s                             SHUNTS                             Systemic VTI:  0.11 m  Systemic Diam: 2.40 cm Marca Ancona MD Electronically signed by Marca Ancona MD Signature Date/Time: 09/24/2020/2:34:50 PM    Final     Cardiac Studies   Echocardiogram 09/24/2020: Impressions: 1. Left ventricular ejection fraction, by estimation, is 60 to 65%. The  left ventricle has normal function. The left ventricle has no regional  wall motion abnormalities. There is mild left ventricular hypertrophy.  Left ventricular diastolic parameters  are indeterminate.  2. Right ventricular systolic function is normal. The right ventricular  size is normal.  3. Left atrial size was severely dilated.  4. Right atrial size was moderately dilated.  5. The mitral valve is normal in structure. Mild mitral valve  regurgitation. No evidence of mitral stenosis.  6. The aortic valve is tricuspid. Aortic valve regurgitation is mild.  Mild to moderate aortic valve sclerosis/calcification is present, without  any evidence of aortic stenosis.  7. Aortic dilatation noted. There is mild dilatation of the ascending  aorta, measuring 38 mm.  8. IVC not well-visualized. Peak RV-RA gradient 23 mmHg.  9. The patient was in atrial fibrillation.  10. Technically difficult study with poor acoustic windows.   Patient Profile     87 y.o. female with a history of paroxysmal atrial fibrillation on  Edoxaban, LBBB, aortic stenosis, hypertension, hyperlipidemia, GERD, and chronic back pain who is being seen for evaluation of atrial fibrillation and chest pain at the request of Dr. Jonathon Bellows.  Assessment & Plan    Paroxysmal Atrial Fibrillation with RVR LBBB - Rates mostly in the 90's to 110's. However, did have a brief episode as high as the 150's to 170's this morning though this may be artifact. - Electrolytes within normal limits.  - TSH norma. - Echo showed normal LV function.  - Toprol-XL 25mg  daily added this morning. - Continue Digoxin. - Continue Edoxaban.  - EP consulted and felt QTc a bit too long to try Tikosyn. However, they did not feel like corneal deposits were an absolute indication for cessation of Amiodarone. For now, will plan for rate control with Digoxin and Toprol-XL. May consider adding Amiodarone if we are unable to control patient rates more successfully  (goal < 120 on average and without her AF related CP; also had CP that improves with heating pad)  Chest Pain - High-sensitivity troponin negative x5. - D-dimer negative. - CRP elevated at 11.2. - Echo with normal LVEF and normal wall motion. - Likely secondary to tachycardia. Resolved with improvement in rates. No ischemic evaluation necessary at this time.  Mild Acute Diastolic CHF Mild to moderate MR- related to LA dilation  - BNP 397.2 on admission. - Initial chest x-ray showed small layering bilateral pleural effusion. Repeat chest x-ray yesterday showed stable low lung volumes with patchy bibasilar lung opacities that favor atelectasis but no pleural effusion or overt edema. - Received one dose of IV Lasix but was not felt to need anymore. Net negative around 800cc this admission. Weight down 7 lbs from admission. - Continue Toprol-XL as above. - Continue to monitor daily weights, strict I/O's, and renal function. - given her small effusion at OSH; may need discharge lasix vs outpatient start  Aortic  Stenosis - History of AS.  - Echo this admission showed mild to moderate aortic sclerosis/calcification without evidence of AS. Mild AI noted.  Hypertension - BP soft but stable.  - Continue low dose beta-blocker as above. - Continue to hold home Amlodipine.  Hyperlipidemia - Continue Lipitor 20mg  daily.   Otherwise, per primary  team.     For questions or updates, please contact [facility] Please consult [email or web address] for contact info under   [name], MD Cardiologist [facility]  [address] [place], [address] [place], [place] [address] [phone]  11:57 AM

## 2020-09-25 NOTE — Progress Notes (Signed)
PROGRESS NOTE    Tammy Walker  HWT:[id number] DOB: 1931-05-12 DOA: 09/23/2020 PCP: Wilfrid Lund, PA   Chief Complaint  Patient presents with  . Chest Pain   Brief Narrative: 87 year old female with history of PAF, chronic pain admitted with chest pain, palpitation that is started early in the midnight 3/27.  Took pain medication without resolution, then seen in the [facility] found to have A. fib with RVR chest x-ray concerning for CHF with elevated BNP 397 mild leukocytosis troponin negative Covid test was negative, was given IV Lasix, placed on digoxin and admitted  Subjective: Seen and examined.  Patient resting comfortably,earlier had low-grade nausea but eating her cereal.  Her daughter is at the bedside. Rates has been controlled with less than 120 bpm mostly  Assessment & Plan:  PAF with RVR: Rate fairly controlled intermittently high, Toprol-XL 25 mg added, continue digoxin, edoxaban and EP cardiology on board ? amiodarone trial, BUT being monitored.Echo-shows mild to moderate MR, there is TR and mild AI, TSH stable.  Chest pain likely from RVR.  Serial troponins negX5.No ischemic changes.  D-dimer is negative.  Echo shows normal EF and normal wall motion.  Pain is resolved with improvement in the rates.  Dyspnea/shortness of breath likely from A. fib RVR:? Mild acute diastolic CHF -received Lasix on admission.   Volume status is stable currently.    Mild to moderate MR Aortic stenosis: Outpatient follow-up  Hypertension: Blood pressure stable.  Continue Toprol and amlodipine.    HLD continue Lipitor  Chronic back pain: Continue hydrocodone, cont home pregabalin/Scheduled tylenolol.  Diet Order            Diet Heart Room service appropriate? Yes; Fluid consistency: Thin  Diet effective now                Patient's Body mass index is 23.49 kg/m.  DVT prophylaxis: DOAC Code Status:   Code Status: Full Code  Family Communication: plan of care discussed with  patient and her daughter at bedside.  Status is: Inpatient Remains inpatient appropriate because:Ongoing diagnostic testing needed not appropriate for outpatient work up and Inpatient level of care appropriate due to severity of illness  Dispo: The patient is from: Home              Anticipated d/c is to: Home tomorrow              Patient currently is not medically stable to d/c.   Difficult to place patient No  Unresulted Labs (From admission, onward)         None      Medications reviewed:  Scheduled Meds: . acetaminophen  500 mg Oral TID  . atorvastatin  20 mg Oral QHS  . bisacodyl  10 mg Oral QHS  . digoxin  0.0625 mg Oral Daily  . edoxaban  30 mg Oral QHS  . feeding supplement  237 mL Oral BID BM  . metoprolol succinate  25 mg Oral Daily  . oxyCODONE  10 mg Oral Q12H  . polyethylene glycol  17 g Oral QHS  . pregabalin  25 mg Oral TID   Continuous Infusions: . diltiazem (CARDIZEM) infusion Stopped (09/24/20 0425)    Consultants:see note  Procedures:see note  Antimicrobials: Anti-infectives (From admission, onward)   None     Culture/Microbiology No results found for: SDES, SPECREQUEST, CULT, REPTSTATUS  Other culture-see note  Objective: Vitals: Today's Vitals   09/25/20 0423 09/25/20 0746 09/25/20 0913 09/25/20 1240  BP:  101/68 97/81 109/69 (!) 102/56  Pulse: (!) 102 89 (!) 101 (!) 103  Resp: Temp: 97.7 F (36.5 C) (!) 97.4 F (36.3 C)  97.7 F (36.5 C)  TempSrc: Oral Oral  Oral  SpO2: 94% 93%  96%  Weight: 56.4 kg     Height:      PainSc:       No intake or output data in the 24 hours ending 09/25/20 1323 Filed Weights   09/23/20 1433 09/24/20 0425 09/25/20 0423  Weight: 59.7 kg 57 kg 56.4 kg   Weight change: -3.339 kg  Intake/Output from previous day: No intake/output data recorded. Intake/Output this shift: No intake/output data recorded. Filed Weights   09/23/20 1433 09/24/20 0425 09/25/20 0423  Weight: 59.7 kg 57 kg 56.4  kg    Examination: General exam: elderly, AAO at baseline, NAD, weak appearing. HEENT:Oral mucosa moist, Ear/Nose WNL grossly, dentition normal. Respiratory system: bilaterally clear,no wheezing or crackles,no use of accessory muscle Cardiovascular system: S1 & S2 +, irreregular, No JVD,. Gastrointestinal system: Abdomen soft, NT,ND, BS+ Nervous System:Alert, awake, moving extremities and grossly nonfocal Extremities: No edema, distal peripheral pulses palpable.  Skin: No rashes,no icterus. MSK: Normal muscle bulk,tone, power  Data Reviewed: I have personally reviewed following labs and imaging studies CBC: Recent Labs  Lab 09/23/20 1443 09/24/20 0214  WBC 11.6* 7.2  NEUTROABS  --  4.4  HGB 14.3 12.7  HCT 44.0 39.6  MCV 88.4 89.0  PLT 307 279   Basic Metabolic Panel: Recent Labs  Lab 09/23/20 1443 09/23/20 1756 09/24/20 0214 09/25/20 0249  NA 138  --  140 137  K 3.8  --  4.1 3.9  CL 103  --  107 107  CO2 23  --  25 23  GLUCOSE 151*  --  117* 108*  BUN 14  --  11 14  CREATININE 0.67  --  0.79 0.85  CALCIUM 9.4  --  8.9 8.6*  MG  --  2.1  --  2.2   GFR: Estimated Creatinine Clearance: 35.2 mL/min (by C-G formula based on SCr of 0.85 mg/dL). Liver Function Tests: Recent Labs  Lab 09/24/20 0214  AST 22  ALT 13  ALKPHOS 40  BILITOT 1.8*  PROT 6.2*  ALBUMIN 3.2*   No results for input(s): LIPASE, AMYLASE in the last 168 hours. No results for input(s): AMMONIA in the last 168 hours. Coagulation Profile: Recent Labs  Lab 09/23/20 1443  INR 1.1   Cardiac Enzymes: No results for input(s): CKTOTAL, CKMB, CKMBINDEX, TROPONINI in the last 168 hours. BNP (last 3 results) No results for input(s): PROBNP in the last 8760 hours. HbA1C: No results for input(s): HGBA1C in the last 72 hours. CBG: No results for input(s): GLUCAP in the last 168 hours. Lipid Profile: No results for input(s): CHOL, HDL, LDLCALC, TRIG, CHOLHDL, LDLDIRECT in the last 72  hours. Thyroid Function Tests: Recent Labs    09/23/20 2034  TSH 1.060   Anemia Panel: No results for input(s): VITAMINB12, FOLATE, FERRITIN, TIBC, IRON, RETICCTPCT in the last 72 hours. Sepsis Labs: No results for input(s): PROCALCITON, LATICACIDVEN in the last 168 hours.  Recent Results (from the past 240 hour(s))  SARS CORONAVIRUS 2 (TAT 6-24 HRS) Nasopharyngeal Nasopharyngeal Swab     Status: None   Collection Time: 09/23/20  3:18 PM   Specimen: Nasopharyngeal Swab  Result Value Ref Range Status   SARS Coronavirus 2 NEGATIVE NEGATIVE Final    Comment: (NOTE)  SARS-CoV-2 target nucleic acids are NOT DETECTED.  The SARS-CoV-2 RNA is generally detectable in upper and lower respiratory specimens during the acute phase of infection. Negative results do not preclude SARS-CoV-2 infection, do not rule out co-infections with other pathogens, and should not be used as the sole basis for treatment or other patient management decisions. Negative results must be combined with clinical observations, patient history, and epidemiological information. The expected result is Negative.  Fact Sheet for Patients: HairSlick.no  Fact Sheet for Healthcare Providers: quierodirigir.com  This test is not yet approved or cleared by the [place] FDA and  has been authorized for detection and/or diagnosis of SARS-CoV-2 by FDA under an Emergency Use Authorization (EUA). This EUA will remain  in effect (meaning this test can be used) for the duration of the COVID-19 declaration under Se ction 564(b)(1) of the Act, 21 U.S.C. section 360bbb-3(b)(1), unless the authorization is terminated or revoked sooner.  Performed at [facility], [address], [place], [place] [address]   Radiology Studies: DG Chest Port 1 View  Result Date: 09/24/2020 CLINICAL DATA:  CHF EXAM: PORTABLE CHEST 1 VIEW COMPARISON:  Chest radiograph from one day  prior. FINDINGS: Low lung volumes. Stable cardiomediastinal silhouette with mild cardiomegaly. No pneumothorax. No pleural effusion. No overt pulmonary edema. Patchy bibasilar lung opacities, similar. IMPRESSION: 1. Stable mild cardiomegaly without overt pulmonary edema. 2. Stable low lung volumes with patchy bibasilar lung opacities, favor atelectasis. Electronically Signed   By: Delbert Phenix M.D.   On: 09/24/2020 07:27   DG Chest Port 1 View  Result Date: 09/23/2020 CLINICAL DATA:  Chest pain EXAM: PORTABLE CHEST 1 VIEW COMPARISON:  None. FINDINGS: Cardiomegaly. Small, layering bilateral pleural effusions. The visualized skeletal structures are unremarkable. IMPRESSION: Cardiomegaly with small, layering bilateral pleural effusions. Electronically Signed   By: Lauralyn Primes M.D.   On: 09/23/2020 15:23   ECHOCARDIOGRAM COMPLETE  Result Date: 09/24/2020    ECHOCARDIOGRAM REPORT   Patient Name:   Tammy Walker Date of Exam: 09/24/2020 Medical Rec #:  [id number]      Height:       61.0 in Accession #:    [id number]     Weight:       125.6 lb Date of Birth:  1930/07/12      BSA:          1.550 m Patient Age:    89 years       BP:           99/50 mmHg Patient Gender: F              HR:           105 bpm. Exam Location:  Inpatient Procedure: 2D Echo, Cardiac Doppler and Color Doppler Indications:    R07.9* Chest pain, unspecified  History:        Patient has no prior history of Echocardiogram examinations.                 CHF, Abnormal ECG, Arrythmias:Atrial Fibrillation; Risk                 Factors:Dyslipidemia and Hypertension.  Sonographer:    Sheralyn Boatman RDCS Referring Phys: [phone] Meryle Ready [facility]  Sonographer Comments: Technically difficult study due to poor echo windows and suboptimal subcostal window. IMPRESSIONS  1. Left ventricular ejection fraction, by estimation, is 60 to 65%. The left ventricle has normal function. The left ventricle has no regional wall motion abnormalities. There  is mild left  ventricular hypertrophy. Left ventricular diastolic parameters are indeterminate.  2. Right ventricular systolic function is normal. The right ventricular size is normal.  3. Left atrial size was severely dilated.  4. Right atrial size was moderately dilated.  5. The mitral valve is normal in structure. Mild mitral valve regurgitation. No evidence of mitral stenosis.  6. The aortic valve is tricuspid. Aortic valve regurgitation is mild. Mild to moderate aortic valve sclerosis/calcification is present, without any evidence of aortic stenosis.  7. Aortic dilatation noted. There is mild dilatation of the ascending aorta, measuring 38 mm.  8. IVC not well-visualized. Peak RV-RA gradient 23 mmHg.  9. The patient was in atrial fibrillation. 10. Technically difficult study with poor acoustic windows. Tec FINDINGS  Left Ventricle: Left ventricular ejection fraction, by estimation, is 60 to 65%. The left ventricle has normal function. The left ventricle has no regional wall motion abnormalities. The left ventricular internal cavity size was normal in size. There is  mild left ventricular hypertrophy. Left ventricular diastolic parameters are indeterminate. Right Ventricle: The right ventricular size is normal. No increase in right ventricular wall thickness. Right ventricular systolic function is normal. Left Atrium: Left atrial size was severely dilated. Right Atrium: Right atrial size was moderately dilated. Pericardium: There is no evidence of pericardial effusion. Mitral Valve: The mitral valve is normal in structure. Mild mitral annular calcification. Mild mitral valve regurgitation. No evidence of mitral valve stenosis. Tricuspid Valve: The tricuspid valve is normal in structure. Tricuspid valve regurgitation is mild. Aortic Valve: The aortic valve is tricuspid. Aortic valve regurgitation is mild. Aortic regurgitation PHT measures 606 msec. Mild to moderate aortic valve sclerosis/calcification is present, without any  evidence of aortic stenosis. Pulmonic Valve: The pulmonic valve was normal in structure. Pulmonic valve regurgitation is not visualized. Aorta: The aortic root is normal in size and structure and aortic dilatation noted. There is mild dilatation of the ascending aorta, measuring 38 mm. Venous: The inferior vena cava was not well visualized. IAS/Shunts: No atrial level shunt detected by color flow Doppler.  LEFT VENTRICLE PLAX 2D LVIDd:         3.40 cm LVIDs:         1.80 cm LV PW:         1.30 cm LV IVS:        1.50 cm LVOT diam:     2.40 cm LV SV:         50 LV SV Index:   32 LVOT Area:     4.52 cm  LV Volumes (MOD) LV vol d, MOD A2C: 24.3 ml LV vol d, MOD A4C: 48.8 ml LV vol s, MOD A2C: 15.7 ml LV vol s, MOD A4C: 20.2 ml LV SV MOD A2C:     8.6 ml LV SV MOD A4C:     48.8 ml LV SV MOD BP:      19.4 ml RIGHT VENTRICLE RV S prime:     6.16 cm/s TAPSE (M-mode): 0.7 cm LEFT ATRIUM             Index       RIGHT ATRIUM           Index LA diam:        3.20 cm 2.06 cm/m  RA Area:     11.00 cm LA Vol (A2C):   29.8 ml 19.23 ml/m RA Volume:   19.70 ml  12.71 ml/m LA Vol (A4C):   55.6 ml 35.87 ml/m LA  Biplane Vol: 41.7 ml 26.90 ml/m  AORTIC VALVE LVOT Vmax:   70.80 cm/s LVOT Vmean:  54.000 cm/s LVOT VTI:    0.111 m AI PHT:      606 msec  AORTA Ao Root diam: 3.50 cm Ao Asc diam:  3.80 cm MITRAL VALVE                TRICUSPID VALVE MV Area (PHT): 4.17 cm     TR Peak grad:   23.0 mmHg MV Decel Time: 182 msec     TR Vmax:        240.00 cm/s MV E velocity: 116.00 cm/s                             SHUNTS                             Systemic VTI:  0.11 m                             Systemic Diam: 2.40 cm Marca Ancona MD Electronically signed by Marca Ancona MD Signature Date/Time: 09/24/2020/2:34:50 PM    Final      LOS: 2 days   Lanae Boast, MD [facility]  09/25/2020, 1:23 PM

## 2020-09-25 NOTE — Progress Notes (Addendum)
Paged cardiology. Wanted to verify that the pt is having ST elevation on telemetry. Pt also having nausea, denies chest pain and SHOB. Verbally advised order for EKG.  EKG completed and cardiology made aware.

## 2020-09-25 NOTE — Discharge Instructions (Signed)
Information on my medicine - SAVAYSA® (edoxaban) ° °WHY WAS SAVAYSA® PRESCRIBED FOR YOU? °Savaysa® was prescribed to treat blood clots that may have been found in the veins of your legs (deep vein thrombosis) or in your lungs (pulmonary embolism) and to reduce the risk of them occurring again. ° °WHAT DO YOU NEED TO KNOW ABOUT SAVAYSA® ? °Take your Savaysa® ONCE DAILY - one tablet in the morning with or without food.  It would be best to take the doses about the same time each day. ° °If you have difficulty swallowing the tablet whole please discuss with your pharmacist how to take the medication safely. ° °Take Savaysa® exactly as prescribed by your doctor and DO NOT stop taking Savaysa® without talking to the doctor who prescribed the medication.  Stopping may increase your risk of developing a new clot or stroke.  Refill your prescription before you run out. ° °After discharge, you should have regular check-up appointments with your healthcare provider that is prescribing your Savaysa®.  In the future your dose may need to be changed if your kidney function or weight changes by a significant amount or as you get older. ° °WHAT DO YOU DO IF YOU MISS A DOSE? °If you are taking Savaysa® ONCE DAILY and you miss a dose, take it as soon as you remember on the same day then continue your regularly scheduled once daily regimen the next day. Do not take two doses of Savaysa® at the same time or on the same day. ° °IMPORTANT SAFETY INFORMATION °A possible side effect of Savaysa® is bleeding. You should call your healthcare provider right away if you experience any of the following: °? Bleeding from an injury or your nose that does not stop. °? Unusual colored urine (red or dark brown) or unusual colored stools (red or black). °? Unusual bruising for unknown reasons. °? A serious fall or if you hit your head (even if there is no bleeding). ° °Some medicines may interact with Savaysa® and might increase your risk of bleeding  or clotting while on Savaysa®. To help avoid this, consult your healthcare provider or pharmacist prior to using any new prescription or non-prescription medications, including herbals, vitamins, non-steroidal anti-inflammatory drugs (NSAIDs) and supplements. ° °This website has more information on Savaysa® (edoxaban): http://www.savaysa.com ° °

## 2020-09-25 NOTE — Progress Notes (Signed)
   Notified by RN this afternoon about ST elevations on telemetry. Patient has known LBBB. Requested EKG. This was done and showed atrial fibrillation, rate of 99 bpm, with LBBB. No acute changes from before. Patient denied any chest pain or shortness of breath to RN. She was having some nausea. Asked RN to reach out to primary team for recommendations for this.   Corrin Parker, PA-C 09/25/2020 5:37 PM

## 2020-09-26 DIAGNOSIS — I4891 Unspecified atrial fibrillation: Secondary | ICD-10-CM | POA: Diagnosis not present

## 2020-09-26 NOTE — Progress Notes (Addendum)
Progress Note  Patient Name: Tammy Walker Date of Encounter: 09/26/2020  [facility] Cardiologist: Donato Schultz, MD   Subjective   Doing well, daughter in the room.  OOB yesterday did OK, reports she was able to all her own self care, ambulated some  Inpatient Medications    Scheduled Meds:  acetaminophen  500 mg Oral TID   atorvastatin  20 mg Oral QHS   bisacodyl  10 mg Oral QHS   digoxin  0.0625 mg Oral Daily   edoxaban  30 mg Oral QHS   feeding supplement  237 mL Oral BID BM   metoprolol succinate  25 mg Oral Daily   oxyCODONE  10 mg Oral Q12H   polyethylene glycol  17 g Oral QHS   pregabalin  25 mg Oral TID   Continuous Infusions:  diltiazem (CARDIZEM) infusion Stopped (09/24/20 0425)   PRN Meds: hydrocortisone, ondansetron (ZOFRAN) IV   Vital Signs    Vitals:   09/26/20 0026 09/26/20 0606 09/26/20 0819 09/26/20 0905  BP: 101/64 105/66 (!) 107/56 (!) 107/56  Pulse: 86 77 76 76  Resp: 17 16 16    Temp: 97.6 F (36.4 C) 98 F (36.7 C) (!) 97.5 F (36.4 C)   TempSrc: Oral Oral Oral   SpO2: 92% 91% 96%   Weight:  57.4 kg    Height:       No intake or output data in the 24 hours ending 09/26/20 0950 Last 3 Weights 09/26/2020 09/25/2020 09/24/2020  Weight (lbs) 126 lb 9.6 oz 124 lb 5.4 oz 125 lb 9.6 oz  Weight (kg) 57.425 kg 56.4 kg 56.972 kg      Telemetry    SR 80's - Personally Reviewed  ECG    No new ekgs - Personally Reviewed  Physical Exam   GEN: No acute distress.   Neck: No JVD Cardiac: RRR, no murmurs, rubs, or gallops.  Respiratory: CTA b/l. GI: Soft, nontender, non-distended  MS: No edema; kyphosis, advanced atrophy Neuro:  Nonfocal  Psych: Normal affect   Labs    High Sensitivity Troponin:   Recent Labs  Lab 09/23/20 1443 09/23/20 1756 09/23/20 2034 09/23/20 2334 09/24/20 1303  TROPONINIHS 5 7 8 10 9       Chemistry Recent Labs  Lab 09/23/20 1443 09/24/20 0214 09/25/20 0249  NA 138 140 137  K 3.8 4.1 3.9  CL 103  107 107  CO2 23 25 23   GLUCOSE 151* 117* 108*  BUN 14 11 14   CREATININE 0.67 0.79 0.85  CALCIUM 9.4 8.9 8.6*  PROT  --  6.2*  --   ALBUMIN  --  3.2*  --   AST  --  22  --   ALT  --  13  --   ALKPHOS  --  40  --   BILITOT  --  1.8*  --   GFRNONAA >60 >60 >60  ANIONGAP 12 8 7      Hematology Recent Labs  Lab 09/23/20 1443 09/24/20 0214  WBC 11.6* 7.2  RBC 4.98 4.45  HGB 14.3 12.7  HCT 44.0 39.6  MCV 88.4 89.0  MCH 28.7 28.5  MCHC 32.5 32.1  RDW 15.1 15.0  PLT 307 279    BNP Recent Labs  Lab 09/23/20 1443  BNP 397.2*     DDimer  Recent Labs  Lab 09/23/20 2034  DDIMER 0.46     Radiology    DG Chest Port 1 View Result Date: 09/24/2020 CLINICAL DATA:  CHF EXAM:  PORTABLE CHEST 1 VIEW COMPARISON:  Chest radiograph from one day prior. FINDINGS: Low lung volumes. Stable cardiomediastinal silhouette with mild cardiomegaly. No pneumothorax. No pleural effusion. No overt pulmonary edema. Patchy bibasilar lung opacities, similar. IMPRESSION: 1. Stable mild cardiomegaly without overt pulmonary edema. 2. Stable low lung volumes with patchy bibasilar lung opacities, favor atelectasis. Electronically Signed   By: Delbert Phenix M.D.   On: 09/24/2020 07:27    Cardiac Studies   09/24/20: TTE IMPRESSIONS   1. Left ventricular ejection fraction, by estimation, is 60 to 65%. The  left ventricle has normal function. The left ventricle has no regional  wall motion abnormalities. There is mild left ventricular hypertrophy.  Left ventricular diastolic parameters  are indeterminate.   2. Right ventricular systolic function is normal. The right ventricular  size is normal.   3. Left atrial size was severely dilated.   4. Right atrial size was moderately dilated.   5. The mitral valve is normal in structure. Mild mitral valve  regurgitation. No evidence of mitral stenosis.   6. The aortic valve is tricuspid. Aortic valve regurgitation is mild.  Mild to moderate aortic valve  sclerosis/calcification is present, without  any evidence of aortic stenosis.   7. Aortic dilatation noted. There is mild dilatation of the ascending  aorta, measuring 38 mm.   8. IVC not well-visualized. Peak RV-RA gradient 23 mmHg.   9. The patient was in atrial fibrillation.  10. Technically difficult study with poor acoustic windows. Tec   Patient Profile     87 y.o. 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 Paroxysmal AFib     CHA2DS2Vasc is 3, on Savaysa appropriately dosed     SR this morning!  Dr. Elberta Fortis has seen the patient this morning OK to discharge from EP perspective Continue dioxin 0.0625mg  daily and Toprol 25mg  daily EP follow up is in place  2. CP     Remains resolved     continue as per primary cards team   For questions or updates, please contact [facility] Please consult [email or web address] for contact info under     Signed, , PA-C  09/26/2020, 9:50 AM     I have seen and examined this patient with 09/28/2020.  Agree with above, note added to reflect my findings.  On exam, RRR, no murmurs. Patient has converted to sinus rhythm. No further EP workup. Discharge meds as above. Cardiology follow up arranged.    Rosalene Wardrop M. Donnah Levert MD 09/26/2020 10:49 AM

## 2020-09-26 NOTE — Progress Notes (Addendum)
Progress Note  Patient Name: Tammy Walker Date of Encounter: 09/26/2020  [facility] Cardiologist: Donato Schultz, MD   Subjective   No acute overnight events. Patient converted to sinus rhythm around 7:30am this morning. No chest pain or shortness of breath. No lightheadedness. She still has a little nausea but this is a chronic issue.  Inpatient Medications    Scheduled Meds: . acetaminophen  500 mg Oral TID  . atorvastatin  20 mg Oral QHS  . bisacodyl  10 mg Oral QHS  . digoxin  0.0625 mg Oral Daily  . edoxaban  30 mg Oral QHS  . feeding supplement  237 mL Oral BID BM  . metoprolol succinate  25 mg Oral Daily  . oxyCODONE  10 mg Oral Q12H  . polyethylene glycol  17 g Oral QHS  . pregabalin  25 mg Oral TID   Continuous Infusions: . diltiazem (CARDIZEM) infusion Stopped (09/24/20 0425)   PRN Meds: hydrocortisone, ondansetron (ZOFRAN) IV   Vital Signs    Vitals:   09/25/20 1240 09/25/20 2024 09/26/20 0026 09/26/20 0606  BP: (!) 102/56 111/71 101/64 105/66  Pulse: (!) 103 94 86 77  Resp: 20 20 17 16   Temp: 97.7 F (36.5 C) 97.7 F (36.5 C) 97.6 F (36.4 C) 98 F (36.7 C)  TempSrc: Oral Oral Oral Oral  SpO2: 96% 95% 92% 91%  Weight:    57.4 kg  Height:       No intake or output data in the 24 hours ending 09/26/20 0740 Last 3 Weights 09/26/2020 09/25/2020 09/24/2020  Weight (lbs) 126 lb 9.6 oz 124 lb 5.4 oz 125 lb 9.6 oz  Weight (kg) 57.425 kg 56.4 kg 56.972 kg      Telemetry    Converted from atrial fibrillation to sinus rhythm with rates in the 70's around 7:30am. - Personally Reviewed  ECG    No new ECG tracing today. - Personally Reviewed  Physical Exam   GEN: No acute distress.   Neck: No JVD. Cardiac: RRR. II/VI systolic murmur. No rubs or gallops. Radial pulses 2+ and equal bilaterally. Respiratory: No increased work of breathing. Diminished breath sounds in bases but no wheezes, rhonchi, or rales. GI: Soft, non-distended, and non-tender.  Bowel sounds present. MS: No lower extremity edema. No deformity. Skin: Warm and dry. Neuro:  No focal deficits. Psych: Normal affect. Responds appropriately.  Labs    High Sensitivity Troponin:   Recent Labs  Lab 09/23/20 1443 09/23/20 1756 09/23/20 2034 09/23/20 2334 09/24/20 1303  TROPONINIHS 5 7 8 10 9       Chemistry Recent Labs  Lab 09/23/20 1443 09/24/20 0214 09/25/20 0249  NA 138 140 137  K 3.8 4.1 3.9  CL 103 107 107  CO2 23 25 23   GLUCOSE 151* 117* 108*  BUN 14 11 14   CREATININE 0.67 0.79 0.85  CALCIUM 9.4 8.9 8.6*  PROT  --  6.2*  --   ALBUMIN  --  3.2*  --   AST  --  22  --   ALT  --  13  --   ALKPHOS  --  40  --   BILITOT  --  1.8*  --   GFRNONAA >60 >60 >60  ANIONGAP 12 8 7      Hematology Recent Labs  Lab 09/23/20 1443 09/24/20 0214  WBC 11.6* 7.2  RBC 4.98 4.45  HGB 14.3 12.7  HCT 44.0 39.6  MCV 88.4 89.0  MCH 28.7 28.5  MCHC 32.5  32.1  RDW 15.1 15.0  PLT 307 279    BNP Recent Labs  Lab 09/23/20 1443  BNP 397.2*     DDimer  Recent Labs  Lab 09/23/20 2034  DDIMER 0.46     Radiology    ECHOCARDIOGRAM COMPLETE  Result Date: 09/24/2020    ECHOCARDIOGRAM REPORT   Patient Name:   Purvis SheffieldMARIA J Nappier Date of Exam: 09/24/2020 Medical Rec #:  [id number]      Height:       61.0 in Accession #:    [id number]     Weight:       125.6 lb Date of Birth:  [birth year]      BSA:          1.550 m Patient Age:    89 years       BP:           99/50 mmHg Patient Gender: F              HR:           105 bpm. Exam Location:  Inpatient Procedure: 2D Echo, Cardiac Doppler and Color Doppler Indications:    R07.9* Chest pain, unspecified  History:        Patient has no prior history of Echocardiogram examinations.                 CHF, Abnormal ECG, Arrythmias:Atrial Fibrillation; Risk                 Factors:Dyslipidemia and Hypertension.  Sonographer:    Sheralyn Boatmanina West RDCS Referring Phys: [phone] Meryle ReadyRSHAD N [facility]  Sonographer Comments: Technically difficult study  due to poor echo windows and suboptimal subcostal window. IMPRESSIONS  1. Left ventricular ejection fraction, by estimation, is 60 to 65%. The left ventricle has normal function. The left ventricle has no regional wall motion abnormalities. There is mild left ventricular hypertrophy. Left ventricular diastolic parameters are indeterminate.  2. Right ventricular systolic function is normal. The right ventricular size is normal.  3. Left atrial size was severely dilated.  4. Right atrial size was moderately dilated.  5. The mitral valve is normal in structure. Mild mitral valve regurgitation. No evidence of mitral stenosis.  6. The aortic valve is tricuspid. Aortic valve regurgitation is mild. Mild to moderate aortic valve sclerosis/calcification is present, without any evidence of aortic stenosis.  7. Aortic dilatation noted. There is mild dilatation of the ascending aorta, measuring 38 mm.  8. IVC not well-visualized. Peak RV-RA gradient 23 mmHg.  9. The patient was in atrial fibrillation. 10. Technically difficult study with poor acoustic windows. Tec FINDINGS  Left Ventricle: Left ventricular ejection fraction, by estimation, is 60 to 65%. The left ventricle has normal function. The left ventricle has no regional wall motion abnormalities. The left ventricular internal cavity size was normal in size. There is  mild left ventricular hypertrophy. Left ventricular diastolic parameters are indeterminate. Right Ventricle: The right ventricular size is normal. No increase in right ventricular wall thickness. Right ventricular systolic function is normal. Left Atrium: Left atrial size was severely dilated. Right Atrium: Right atrial size was moderately dilated. Pericardium: There is no evidence of pericardial effusion. Mitral Valve: The mitral valve is normal in structure. Mild mitral annular calcification. Mild mitral valve regurgitation. No evidence of mitral valve stenosis. Tricuspid Valve: The tricuspid valve is  normal in structure. Tricuspid valve regurgitation is mild. Aortic Valve: The aortic valve is tricuspid. Aortic valve regurgitation is mild. Aortic regurgitation PHT measures  606 msec. Mild to moderate aortic valve sclerosis/calcification is present, without any evidence of aortic stenosis. Pulmonic Valve: The pulmonic valve was normal in structure. Pulmonic valve regurgitation is not visualized. Aorta: The aortic root is normal in size and structure and aortic dilatation noted. There is mild dilatation of the ascending aorta, measuring 38 mm. Venous: The inferior vena cava was not well visualized. IAS/Shunts: No atrial level shunt detected by color flow Doppler.  LEFT VENTRICLE PLAX 2D LVIDd:         3.40 cm LVIDs:         1.80 cm LV PW:         1.30 cm LV IVS:        1.50 cm LVOT diam:     2.40 cm LV SV:         50 LV SV Index:   32 LVOT Area:     4.52 cm  LV Volumes (MOD) LV vol d, MOD A2C: 24.3 ml LV vol d, MOD A4C: 48.8 ml LV vol s, MOD A2C: 15.7 ml LV vol s, MOD A4C: 20.2 ml LV SV MOD A2C:     8.6 ml LV SV MOD A4C:     48.8 ml LV SV MOD BP:      19.4 ml RIGHT VENTRICLE RV S prime:     6.16 cm/s TAPSE (M-mode): 0.7 cm LEFT ATRIUM             Index       RIGHT ATRIUM           Index LA diam:        3.20 cm 2.06 cm/m  RA Area:     11.00 cm LA Vol (A2C):   29.8 ml 19.23 ml/m RA Volume:   19.70 ml  12.71 ml/m LA Vol (A4C):   55.6 ml 35.87 ml/m LA Biplane Vol: 41.7 ml 26.90 ml/m  AORTIC VALVE LVOT Vmax:   70.80 cm/s LVOT Vmean:  54.000 cm/s LVOT VTI:    0.111 m AI PHT:      606 msec  AORTA Ao Root diam: 3.50 cm Ao Asc diam:  3.80 cm MITRAL VALVE                TRICUSPID VALVE MV Area (PHT): 4.17 cm     TR Peak grad:   23.0 mmHg MV Decel Time: 182 msec     TR Vmax:        240.00 cm/s MV E velocity: 116.00 cm/s                             SHUNTS                             Systemic VTI:  0.11 m                             Systemic Diam: 2.40 cm Marca Ancona MD Electronically signed by Marca Ancona MD  Signature Date/Time: 09/24/2020/2:34:50 PM    Final     Cardiac Studies   Echocardiogram 09/24/2020: Impressions: 1. Left ventricular ejection fraction, by estimation, is 60 to 65%. The  left ventricle has normal function. The left ventricle has no regional  wall motion abnormalities. There is mild left ventricular hypertrophy.  Left ventricular diastolic parameters  are indeterminate.  2. Right ventricular systolic function  is normal. The right ventricular  size is normal.  3. Left atrial size was severely dilated.  4. Right atrial size was moderately dilated.  5. The mitral valve is normal in structure. Mild mitral valve  regurgitation. No evidence of mitral stenosis.  6. The aortic valve is tricuspid. Aortic valve regurgitation is mild.  Mild to moderate aortic valve sclerosis/calcification is present, without  any evidence of aortic stenosis.  7. Aortic dilatation noted. There is mild dilatation of the ascending  aorta, measuring 38 mm.  8. IVC not well-visualized. Peak RV-RA gradient 23 mmHg.  9. The patient was in atrial fibrillation.  10. Technically difficult study with poor acoustic windows.   Patient Profile     87 y.o. female with a history of paroxysmal atrial fibrillation on Edoxaban, LBBB, aortic stenosis, hypertension, hyperlipidemia, GERD, and chronic back pain who is being seen for evaluation of atrial fibrillation and chest pain at the request of Dr. Jonathon Bellows.  Assessment & Plan    Paroxysmal Atrial Fibrillation with RVR LBBB - Converted to sinus rhythm this morning. Rates in the 70's. - Electrolytes within normal limits.  - TSH norma. - Echo showed normal LV function.  - Continue Toprol-XL 25mg  daily.  - Continue Digoxin. - Continue Edoxaban.  - EP consulted and felt QTc a bit too long to try Tikosyn. However, they did not feel like corneal deposits were an absolute indication for cessation of Amiodarone. Will continue Digoxin and Toprol-XL. May consider  adding Amiodarone if patient has recurrent atrial fibrillation and we are unable to control patient rates more successfully (goal <120 on average and without her atrial fibrillation related chest pain).  Chest Pain - Likely secondary to tachycardia. Resolved with improvement in rates. Also had chest pain that improved with heating pad.  - High-sensitivity troponin negative x5. - D-dimer negative. - CRP elevated at 11.2. - Echo with normal LVEF and normal wall motion. Mild to moderate MR and mild AI also noted. - No chest pain. - No ischemic evaluation needed at this time.  Mild Acute Diastolic CHF Mild to Moderate MR (Related to LA Dilation) - BNP 397.2 on admission. - Initial chest x-ray showed small layering bilateral pleural effusion. Repeat chest x-ray yesterday showed stable low lung volumes with patchy bibasilar lung opacities that favor atelectasis but no pleural effusion or overt edema. - Echo showed LVEF of 60-65% with normal wall motion. RV normal. - Received one dose of IV Lasix but was not felt to need anymore. No documented I/O's over the last few days. Weight up 2lbs from yesterday but down 5lbs from admission.  - Will discuss whether or not patient needs to be discharged on PO Lasix with MD. - Continue Toprol-XL as above. - Continue to monitor daily weights, strict I/O's, and renal function.  Aortic Stenosis - History of AS.  - Echo this admission showed mild to moderate aortic sclerosis/calcification without evidence of AS. Mild AI noted.  Hypertension - BP soft but stable.  - Continue low dose beta-blocker as above. - Continue to hold home Amlodipine.  Hyperlipidemia - Continue Lipitor 20mg  daily.   Otherwise, per primary team.  For questions or updates, please contact [facility] Please consult [email or web address] for contact info under     Signed, , PA-C  09/26/2020, 7:40 AM     Personally seen and examined. Agree with APP above with the  following comments: Briefly 87 yo F with MR and AF  Patient notes some fatigue that does not  correlated with BP  Exam notable for SR (had converted at ~ 7 AM)  Personally reviewed relevant tests; has some MR Would recommend dig and metoprolol - may need metoprolol  Cessation if low BP is culprit of of fatigue - potential 3/31 DC Discussed with patient, daughter and primary MD

## 2020-09-26 NOTE — Progress Notes (Signed)
PROGRESS NOTE    Tammy Walker  GXQ:[id number] DOB: 07-Dec-1930 DOA: 09/23/2020 PCP: Wilfrid Lund, PA   Chief Complaint  Patient presents with  . Chest Pain   Brief Narrative: 87 year old female with history of PAF, chronic pain admitted with chest pain, palpitation that is started early in the midnight 3/27.  Took pain medication without resolution, then seen in the [facility] ED found to have A. fib with RVR chest x-ray concerning for CHF with elevated BNP 397 mild leukocytosis troponin negative Covid test was negative, was given IV Lasix, placed on digoxin and admitted  Subjective: Seen this morning.  Patient has converted normal sinus rhythm this morning.  She had received her mail and was sleeping on my exam appears more fatigued.  Daughter was hoping that she would get up and they could walk.  Assessment & Plan:  PAF with RVR:now In NSR. On Toprol-XL 25 mg but Bp soft/ pt also fatigued- watch BP overnight. Cont Digoxin, edoxaban as per EP.Echo-shows mild to moderate MR, there is TR and mild AI, TSH stable.  Chest painlikely from RVR. Serial troponins negX6.No ischemic changes. D-dimer is negative. Echo shows normal EF and normal wall motion. Pain is resolved with improvement in the rates.  Dyspnea/shortness of breath likely from A. fib RVR:?Mild acute diastolic CHF -received Lasix on admission.not on lasix now. Monitor.  Mild to moderate MR Aortic stenosis: OP F/U  Hypertension: BP soft. Monitor, on toprol which is new. On amlodipine.   HLD:On Lipitor  Chronic back pain:Continue hydrocodone, cont home pregabalin/Scheduled tylenolol.  Diet Order            Diet Heart Room service appropriate? Yes; Fluid consistency: Thin  Diet effective now                Patient's Body mass index is 23.92 kg/m.  DVT prophylaxis: DOAC Code Status:   Code Status: Full Code  Family Communication: plan of care discussed with patient and her daughter at bedside.  Status  is: Inpatient Remains inpatient appropriate because:Ongoing diagnostic testing needed not appropriate for outpatient work up and Inpatient level of care appropriate due to severity of illness  Dispo: The patient is from: Home              Anticipated d/c is to: Home tomorrow if blood pressure remains stable as it is soft and patient appears fatigued.  Discussed with cardiology.              Patient currently is not medically stable to d/c.   Difficult to place patient No  Unresulted Labs (From admission, onward)         None      Medications reviewed:  Scheduled Meds: . acetaminophen  500 mg Oral TID  . atorvastatin  20 mg Oral QHS  . bisacodyl  10 mg Oral QHS  . digoxin  0.0625 mg Oral Daily  . edoxaban  30 mg Oral QHS  . feeding supplement  237 mL Oral BID BM  . metoprolol succinate  25 mg Oral Daily  . oxyCODONE  10 mg Oral Q12H  . polyethylene glycol  17 g Oral QHS  . pregabalin  25 mg Oral TID   Continuous Infusions: . diltiazem (CARDIZEM) infusion Stopped (09/24/20 0425)    Consultants:see note  Procedures:see note  Antimicrobials: Anti-infectives (From admission, onward)   None     Culture/Microbiology No results found for: SDES, SPECREQUEST, CULT, REPTSTATUS  Other culture-see note  Objective: Vitals: Today's Vitals  09/26/20 0800 09/26/20 0819 09/26/20 0905 09/26/20 1116  BP:  (!) 107/56 (!) 107/56 (!) 99/48  Pulse:  76 76 69  Resp:  16  16  Temp:  (!) 97.5 F (36.4 C)  97.9 F (36.6 C)  TempSrc:  Oral  Oral  SpO2:  96%  94%  Weight:      Height:      PainSc: 0-No pain       Intake/Output Summary (Last 24 hours) at 09/26/2020 1304 Last data filed at 09/26/2020 1304 Gross per 24 hour  Intake --  Output 900 ml  Net -900 ml   Filed Weights   09/24/20 0425 09/25/20 0423 09/26/20 0606  Weight: 57 kg 56.4 kg 57.4 kg   Weight change: 1.025 kg  Intake/Output from previous day: No intake/output data recorded. Intake/Output this shift: Total  I/O In: -  Out: 900 [Urine:900] Filed Weights   09/24/20 0425 09/25/20 0423 09/26/20 0606  Weight: 57 kg 56.4 kg 57.4 kg    Examination: General exam: AAOx3 weak, frail. Sleepy HEENT:Oral mucosa moist, Ear/Nose WNL grossly, dentition normal. Respiratory system: bilaterally clear,no wheezing or crackles,no use of accessory muscle Cardiovascular system: S1 & S2 +, No JVD,. Gastrointestinal system: Abdomen soft, NT,ND, BS+ Nervous System:Alert, awake, moving extremities and grossly nonfocal Extremities: No edema, distal peripheral pulses palpable.  Skin: No rashes,no icterus. MSK: Normal muscle bulk,tone, power  Data Reviewed: I have personally reviewed following labs and imaging studies CBC: Recent Labs  Lab 09/23/20 1443 09/24/20 0214  WBC 11.6* 7.2  NEUTROABS  --  4.4  HGB 14.3 12.7  HCT 44.0 39.6  MCV 88.4 89.0  PLT 307 279   Basic Metabolic Panel: Recent Labs  Lab 09/23/20 1443 09/23/20 1756 09/24/20 0214 09/25/20 0249  NA 138  --  140 137  K 3.8  --  4.1 3.9  CL 103  --  107 107  CO2 23  --  25 23  GLUCOSE 151*  --  117* 108*  BUN 14  --  11 14  CREATININE 0.67  --  0.79 0.85  CALCIUM 9.4  --  8.9 8.6*  MG  --  2.1  --  2.2   GFR: Estimated Creatinine Clearance: 38 mL/min (by C-G formula based on SCr of 0.85 mg/dL). Liver Function Tests: Recent Labs  Lab 09/24/20 0214  AST 22  ALT 13  ALKPHOS 40  BILITOT 1.8*  PROT 6.2*  ALBUMIN 3.2*   No results for input(s): LIPASE, AMYLASE in the last 168 hours. No results for input(s): AMMONIA in the last 168 hours. Coagulation Profile: Recent Labs  Lab 09/23/20 1443  INR 1.1   Cardiac Enzymes: No results for input(s): CKTOTAL, CKMB, CKMBINDEX, TROPONINI in the last 168 hours. BNP (last 3 results) No results for input(s): PROBNP in the last 8760 hours. HbA1C: No results for input(s): HGBA1C in the last 72 hours. CBG: No results for input(s): GLUCAP in the last 168 hours. Lipid Profile: No results  for input(s): CHOL, HDL, LDLCALC, TRIG, CHOLHDL, LDLDIRECT in the last 72 hours. Thyroid Function Tests: Recent Labs    09/23/20 2034  TSH 1.060   Anemia Panel: No results for input(s): VITAMINB12, FOLATE, FERRITIN, TIBC, IRON, RETICCTPCT in the last 72 hours. Sepsis Labs: No results for input(s): PROCALCITON, LATICACIDVEN in the last 168 hours.  Recent Results (from the past 240 hour(s))  SARS CORONAVIRUS 2 (TAT 6-24 HRS) Nasopharyngeal Nasopharyngeal Swab     Status: None   Collection Time: 09/23/20  3:18 PM   Specimen: Nasopharyngeal Swab  Result Value Ref Range Status   SARS Coronavirus 2 NEGATIVE NEGATIVE Final    Comment: (NOTE) SARS-CoV-2 target nucleic acids are NOT DETECTED.  The SARS-CoV-2 RNA is generally detectable in upper and lower respiratory specimens during the acute phase of infection. Negative results do not preclude SARS-CoV-2 infection, do not rule out co-infections with other pathogens, and should not be used as the sole basis for treatment or other patient management decisions. Negative results must be combined with clinical observations, patient history, and epidemiological information. The expected result is Negative.  Fact Sheet for Patients: HairSlick.no  Fact Sheet for Healthcare Providers: quierodirigir.com  This test is not yet approved or cleared by the [place] FDA and  has been authorized for detection and/or diagnosis of SARS-CoV-2 by FDA under an Emergency Use Authorization (EUA). This EUA will remain  in effect (meaning this test can be used) for the duration of the COVID-19 declaration under Se ction 564(b)(1) of the Act, 21 U.S.C. section 360bbb-3(b)(1), unless the authorization is terminated or revoked sooner.  Performed at [facility], [address], [place], [place] [address]   Radiology Studies: No results found.   LOS: 3 days   Lanae Boast, MD [facility]  09/26/2020, 1:04 PM

## 2020-09-26 NOTE — Discharge Summary (Signed)
Physician Discharge Summary  Tammy Walker ZOX:[id number] DOB: August 24, 1930 DOA: 09/23/2020  PCP: Wilfrid LundBecker, Anna G, PA  Admit date: 09/23/2020 Discharge date: 09/27/2020  Admitted From: Home Disposition: Home  Recommendations for Outpatient Follow-up:  1. Follow up with PCP in 1-2 weeks and with cardiology as outpatient 2. Please obtain BMP/CBC in one week  Home Health:NO  Equipment/Devices: NONE  Discharge Condition: Stable Code Status:   Code Status: Full Code Diet recommendation:  Diet Order            Diet - low sodium heart healthy           Diet Heart Room service appropriate? Yes; Fluid consistency: Thin  Diet effective now                 Brief/Interim Summary: 87 year old female with history of PAF, chronic pain admitted with chest pain, palpitation that is started early in the midnight 3/27.  Took pain medication without resolution, then seen in the [facility] ED found to have A. fib with RVR chest x-ray concerning for CHF with elevated BNP 397 mild leukocytosis troponin negative Covid test was negative, was given IV Lasix, placed on digoxin and admitted Patient seen 1 dose of IV Lasix.  Seen by cardiology echo with mild to moderate MR normal EF stable TSH on work-up.  Patient was seen by EP cardiology regimen changed to Toprol and digoxin converted normal sinus rhythm 3/30 a.m.. Had a soft blood pressure patient was fatigued and monitored overnight Patient remained stable heart rate controlled in sinus rhythm no more hypotension.  She feels well and plan for discharge home, discussed with Dr. Izora Ribashandrasekhar.  Discharge Diagnoses:   PAF with RVR:now In NSR. On Toprol-XL 25 mg but Bp soft/ pt also fatigued- watch BP overnight. Cont Digoxin, edoxaban as per EP.Echo-shows mild to moderate MR, there is TR and mild AI, TSH stable.  She will need close cardiology follow-up  Chest painlikely from RVR. Serial troponins negX6.No ischemic changes. D-dimer is negative. Echo shows  normal EF and normal wall motion. Pain is resolved with improvement in the rates.  Dyspnea/shortness of breath likely from A. fib RVR:?Mild acute diastolic CHF -received Lasix on admission.not on lasix now. Monitor.  Mild to moderate MR Aortic stenosis: Outpatient follow-up  Hypertension:  Transient episode of soft BP but since blood pressure has been stable continue  toprol which is new.  Discontinued amlodipine.   HLD:On Lipitor  Chronic back pain:Continue hydrocodone, cont home pregabalin/Scheduled tylenolol.  Consults:  Cardiology, EP cardiology  Subjective: Alert awake no new complaint Daughter at the bedside.  Hopeful about going home today.  Discharge Exam: Vitals:   09/27/20 0831 09/27/20 0949  BP:  135/63  Pulse:  67  Resp:  20  Temp:  (!) 97.5 F (36.4 C)  SpO2: 94% 98%   General: Pt is alert, awake, not in acute distress Cardiovascular: RRR, S1/S2 +, no rubs, no gallops Respiratory: CTA bilaterally, no wheezing, no rhonchi Abdominal: Soft, NT, ND, bowel sounds + Extremities: no edema, no cyanosis  Discharge Instructions  Discharge Instructions    Diet - low sodium heart healthy   Complete by: As directed    Discharge instructions   Complete by: As directed    Follow-up with your cardiology  Please call call MD or return to ER for similar or worsening recurring problem that brought you to hospital or if any fever,nausea/vomiting,abdominal pain, uncontrolled pain, chest pain,  shortness of breath or any other alarming symptoms.  Please  follow-up your doctor as instructed in a week time and call the office for appointment.  Please avoid alcohol, smoking, or any other illicit substance and maintain healthy habits including taking your regular medications as prescribed.  You were cared for by a hospitalist during your hospital stay. If you have any questions about your discharge medications or the care you received while you were in the hospital  after you are discharged, you can call the unit and ask to speak with the hospitalist on call if the hospitalist that took care of you is not available.  Once you are discharged, your primary care physician will handle any further medical issues. Please note that NO REFILLS for any discharge medications will be authorized once you are discharged, as it is imperative that you return to your primary care physician (or establish a relationship with a primary care physician if you do not have one) for your aftercare needs so that they can reassess your need for medications and monitor your lab values   Increase activity slowly   Complete by: As directed      Allergies as of 09/27/2020   No Known Allergies     Medication List    STOP taking these medications   amLODipine 5 MG tablet Commonly known as: NORVASC     TAKE these medications   acetaminophen 500 MG tablet Commonly known as: TYLENOL Take 500 mg by mouth 3 (three) times daily.   atorvastatin 20 MG tablet Commonly known as: LIPITOR Take 20 mg by mouth at bedtime.   bisacodyl 5 MG EC tablet Commonly known as: DULCOLAX Take 10 mg by mouth at bedtime.   calcium-vitamin D 500-200 MG-UNIT tablet Commonly known as: OSCAL WITH D Take 1 tablet by mouth 2 (two) times daily.   CALCIUM/D3 ADULT GUMMIES PO Take 1 tablet by mouth 4 (four) times daily. Calcium 200 mg/ Vitamin D3 500 I.U. - Vitafusion   cetirizine 10 MG tablet Commonly known as: ZYRTEC Take 10 mg by mouth every morning.   denosumab 60 MG/ML Sosy injection Commonly known as: PROLIA Inject 60 mg into the skin every 6 (six) months.   diclofenac Sodium 1 % Gel Commonly known as: VOLTAREN Apply 4 g topically 4 (four) times daily as needed (back pain).   Digoxin 62.5 MCG Tabs Take 0.0625 mg by mouth daily.   edoxaban 30 MG Tabs tablet Commonly known as: Savaysa Take 1 tablet (30 mg total) by mouth daily. What changed: when to take this   feeding supplement  Liqd Take 237 mLs by mouth 2 (two) times daily between meals.   HYDROcodone Bitartrate ER 15 MG Cp12 Take 15 mg by mouth 2 (two) times daily.   metoprolol succinate 25 MG 24 hr tablet Commonly known as: TOPROL-XL Take 1 tablet (25 mg total) by mouth daily.   oxyCODONE 5 MG/5ML solution Commonly known as: ROXICODONE Take 2.5 mg by mouth 2 (two) times daily as needed for breakthrough pain.   polyethylene glycol 17 g packet Commonly known as: MIRALAX / GLYCOLAX Take 17 g by mouth at bedtime.   pregabalin 25 MG capsule Commonly known as: LYRICA Take 25 mg by mouth 3 (three) times daily.   shark liver oil-cocoa butter 0.25-3-85.5 % suppository Commonly known as: PREPARATION H Place 1 suppository rectally as needed for hemorrhoids.       Follow-up Information    Marinus Maw, MD Follow up.   Specialty: Cardiology Why: Hospital follow-up with EP (electrophysiology team) scheduled for 10/19/2020  at 11:15am. Please arrive 15 minutes early for check-in. If this date/time does not work for your, please call our office to reschedule. Contact information: [address] [place] [address] [phone]        Dyann Kief, PA-C Follow up.   Specialty: Cardiology Why: Hospital follow-up with Cardiology scheduled for 10/09/2020 at 9:15am with Jacolyn Reedy, one of Dr. Judd Gaudier PAs. Please arrive 15 minutes early for check-in. If this date/time does not work for you, please call our office to reschedule. Contact information: [address] [place] [address] [phone]              No Known Allergies  The results of significant diagnostics from this hospitalization (including imaging, microbiology, ancillary and laboratory) are listed below for reference.    Microbiology: Recent Results (from the past 240 hour(s))  SARS CORONAVIRUS 2 (TAT 6-24 HRS) Nasopharyngeal Nasopharyngeal Swab     Status: None   Collection Time: 09/23/20   3:18 PM   Specimen: Nasopharyngeal Swab  Result Value Ref Range Status   SARS Coronavirus 2 NEGATIVE NEGATIVE Final    Comment: (NOTE) SARS-CoV-2 target nucleic acids are NOT DETECTED.  The SARS-CoV-2 RNA is generally detectable in upper and lower respiratory specimens during the acute phase of infection. Negative results do not preclude SARS-CoV-2 infection, do not rule out co-infections with other pathogens, and should not be used as the sole basis for treatment or other patient management decisions. Negative results must be combined with clinical observations, patient history, and epidemiological information. The expected result is Negative.  Fact Sheet for Patients: HairSlick.no  Fact Sheet for Healthcare Providers: quierodirigir.com  This test is not yet approved or cleared by the [place] FDA and  has been authorized for detection and/or diagnosis of SARS-CoV-2 by FDA under an Emergency Use Authorization (EUA). This EUA will remain  in effect (meaning this test can be used) for the duration of the COVID-19 declaration under Se ction 564(b)(1) of the Act, 21 U.S.C. section 360bbb-3(b)(1), unless the authorization is terminated or revoked sooner.  Performed at [facility], [address], [place], [place] [address]     Procedures/Studies: DG Chest Port 1 View  Result Date: 09/24/2020 CLINICAL DATA:  CHF EXAM: PORTABLE CHEST 1 VIEW COMPARISON:  Chest radiograph from one day prior. FINDINGS: Low lung volumes. Stable cardiomediastinal silhouette with mild cardiomegaly. No pneumothorax. No pleural effusion. No overt pulmonary edema. Patchy bibasilar lung opacities, similar. IMPRESSION: 1. Stable mild cardiomegaly without overt pulmonary edema. 2. Stable low lung volumes with patchy bibasilar lung opacities, favor atelectasis. Electronically Signed   By: Delbert Phenix M.D.   On: 09/24/2020 07:27   DG Chest Port 1  View  Result Date: 09/23/2020 CLINICAL DATA:  Chest pain EXAM: PORTABLE CHEST 1 VIEW COMPARISON:  None. FINDINGS: Cardiomegaly. Small, layering bilateral pleural effusions. The visualized skeletal structures are unremarkable. IMPRESSION: Cardiomegaly with small, layering bilateral pleural effusions. Electronically Signed   By: Lauralyn Primes M.D.   On: 09/23/2020 15:23   ECHOCARDIOGRAM COMPLETE  Result Date: 09/24/2020    ECHOCARDIOGRAM REPORT   Patient Name:   Tammy Walker Date of Exam: 09/24/2020 Medical Rec #:  [id number]      Height:       61.0 in Accession #:    [id number]     Weight:       125.6 lb Date of Birth:  Dec 14, 1930      BSA:  1.550 m Patient Age:    65 years       BP:           99/50 mmHg Patient Gender: F              HR:           105 bpm. Exam Location:  Inpatient Procedure: 2D Echo, Cardiac Doppler and Color Doppler Indications:    R07.9* Chest pain, unspecified  History:        Patient has no prior history of Echocardiogram examinations.                 CHF, Abnormal ECG, Arrythmias:Atrial Fibrillation; Risk                 Factors:Dyslipidemia and Hypertension.  Sonographer:    Sheralyn Boatman RDCS Referring Phys: [phone] Meryle Ready [facility]  Sonographer Comments: Technically difficult study due to poor echo windows and suboptimal subcostal window. IMPRESSIONS  1. Left ventricular ejection fraction, by estimation, is 60 to 65%. The left ventricle has normal function. The left ventricle has no regional wall motion abnormalities. There is mild left ventricular hypertrophy. Left ventricular diastolic parameters are indeterminate.  2. Right ventricular systolic function is normal. The right ventricular size is normal.  3. Left atrial size was severely dilated.  4. Right atrial size was moderately dilated.  5. The mitral valve is normal in structure. Mild mitral valve regurgitation. No evidence of mitral stenosis.  6. The aortic valve is tricuspid. Aortic valve regurgitation is mild. Mild to  moderate aortic valve sclerosis/calcification is present, without any evidence of aortic stenosis.  7. Aortic dilatation noted. There is mild dilatation of the ascending aorta, measuring 38 mm.  8. IVC not well-visualized. Peak RV-RA gradient 23 mmHg.  9. The patient was in atrial fibrillation. 10. Technically difficult study with poor acoustic windows. Tec FINDINGS  Left Ventricle: Left ventricular ejection fraction, by estimation, is 60 to 65%. The left ventricle has normal function. The left ventricle has no regional wall motion abnormalities. The left ventricular internal cavity size was normal in size. There is  mild left ventricular hypertrophy. Left ventricular diastolic parameters are indeterminate. Right Ventricle: The right ventricular size is normal. No increase in right ventricular wall thickness. Right ventricular systolic function is normal. Left Atrium: Left atrial size was severely dilated. Right Atrium: Right atrial size was moderately dilated. Pericardium: There is no evidence of pericardial effusion. Mitral Valve: The mitral valve is normal in structure. Mild mitral annular calcification. Mild mitral valve regurgitation. No evidence of mitral valve stenosis. Tricuspid Valve: The tricuspid valve is normal in structure. Tricuspid valve regurgitation is mild. Aortic Valve: The aortic valve is tricuspid. Aortic valve regurgitation is mild. Aortic regurgitation PHT measures 606 msec. Mild to moderate aortic valve sclerosis/calcification is present, without any evidence of aortic stenosis. Pulmonic Valve: The pulmonic valve was normal in structure. Pulmonic valve regurgitation is not visualized. Aorta: The aortic root is normal in size and structure and aortic dilatation noted. There is mild dilatation of the ascending aorta, measuring 38 mm. Venous: The inferior vena cava was not well visualized. IAS/Shunts: No atrial level shunt detected by color flow Doppler.  LEFT VENTRICLE PLAX 2D LVIDd:          3.40 cm LVIDs:         1.80 cm LV PW:         1.30 cm LV IVS:        1.50 cm LVOT diam:  2.40 cm LV SV:         50 LV SV Index:   32 LVOT Area:     4.52 cm  LV Volumes (MOD) LV vol d, MOD A2C: 24.3 ml LV vol d, MOD A4C: 48.8 ml LV vol s, MOD A2C: 15.7 ml LV vol s, MOD A4C: 20.2 ml LV SV MOD A2C:     8.6 ml LV SV MOD A4C:     48.8 ml LV SV MOD BP:      19.4 ml RIGHT VENTRICLE RV S prime:     6.16 cm/s TAPSE (M-mode): 0.7 cm LEFT ATRIUM             Index       RIGHT ATRIUM           Index LA diam:        3.20 cm 2.06 cm/m  RA Area:     11.00 cm LA Vol (A2C):   29.8 ml 19.23 ml/m RA Volume:   19.70 ml  12.71 ml/m LA Vol (A4C):   55.6 ml 35.87 ml/m LA Biplane Vol: 41.7 ml 26.90 ml/m  AORTIC VALVE LVOT Vmax:   70.80 cm/s LVOT Vmean:  54.000 cm/s LVOT VTI:    0.111 m AI PHT:      606 msec  AORTA Ao Root diam: 3.50 cm Ao Asc diam:  3.80 cm MITRAL VALVE                TRICUSPID VALVE MV Area (PHT): 4.17 cm     TR Peak grad:   23.0 mmHg MV Decel Time: 182 msec     TR Vmax:        240.00 cm/s MV E velocity: 116.00 cm/s                             SHUNTS                             Systemic VTI:  0.11 m                             Systemic Diam: 2.40 cm Marca Ancona MD Electronically signed by Marca Ancona MD Signature Date/Time: 09/24/2020/2:34:50 PM    Final     Labs: BNP (last 3 results) Recent Labs    09/23/20 1443  BNP 397.2*   Basic Metabolic Panel: Recent Labs  Lab 09/23/20 1443 09/23/20 1756 09/24/20 0214 09/25/20 0249  NA 138  --  140 137  K 3.8  --  4.1 3.9  CL 103  --  107 107  CO2 23  --  25 23  GLUCOSE 151*  --  117* 108*  BUN 14  --  11 14  CREATININE 0.67  --  0.79 0.85  CALCIUM 9.4  --  8.9 8.6*  MG  --  2.1  --  2.2   Liver Function Tests: Recent Labs  Lab 09/24/20 0214  AST 22  ALT 13  ALKPHOS 40  BILITOT 1.8*  PROT 6.2*  ALBUMIN 3.2*   No results for input(s): LIPASE, AMYLASE in the last 168 hours. No results for input(s): AMMONIA in the last 168  hours. CBC: Recent Labs  Lab 09/23/20 1443 09/24/20 0214  WBC 11.6* 7.2  NEUTROABS  --  4.4  HGB 14.3 12.7  HCT 44.0 39.6  MCV 88.4 89.0  PLT 307 279   Cardiac Enzymes: No results for input(s): CKTOTAL, CKMB, CKMBINDEX, TROPONINI in the last 168 hours. BNP: Invalid input(s): POCBNP CBG: No results for input(s): GLUCAP in the last 168 hours. D-Dimer No results for input(s): DDIMER in the last 72 hours. Hgb A1c No results for input(s): HGBA1C in the last 72 hours. Lipid Profile No results for input(s): CHOL, HDL, LDLCALC, TRIG, CHOLHDL, LDLDIRECT in the last 72 hours. Thyroid function studies No results for input(s): TSH, T4TOTAL, T3FREE, THYROIDAB in the last 72 hours.  Invalid input(s): FREET3 Anemia work up No results for input(s): VITAMINB12, FOLATE, FERRITIN, TIBC, IRON, RETICCTPCT in the last 72 hours. Urinalysis No results found for: COLORURINE, APPEARANCEUR, LABSPEC, PHURINE, GLUCOSEU, HGBUR, BILIRUBINUR, KETONESUR, PROTEINUR, UROBILINOGEN, NITRITE, LEUKOCYTESUR Sepsis Labs Invalid input(s): PROCALCITONIN,  WBC,  LACTICIDVEN Microbiology Recent Results (from the past 240 hour(s))  SARS CORONAVIRUS 2 (TAT 6-24 HRS) Nasopharyngeal Nasopharyngeal Swab     Status: None   Collection Time: 09/23/20  3:18 PM   Specimen: Nasopharyngeal Swab  Result Value Ref Range Status   SARS Coronavirus 2 NEGATIVE NEGATIVE Final    Comment: (NOTE) SARS-CoV-2 target nucleic acids are NOT DETECTED.  The SARS-CoV-2 RNA is generally detectable in upper and lower respiratory specimens during the acute phase of infection. Negative results do not preclude SARS-CoV-2 infection, do not rule out co-infections with other pathogens, and should not be used as the sole basis for treatment or other patient management decisions. Negative results must be combined with clinical observations, patient history, and epidemiological information. The expected result is Negative.  Fact Sheet for  Patients: HairSlick.no  Fact Sheet for Healthcare Providers: quierodirigir.com  This test is not yet approved or cleared by the [place] FDA and  has been authorized for detection and/or diagnosis of SARS-CoV-2 by FDA under an Emergency Use Authorization (EUA). This EUA will remain  in effect (meaning this test can be used) for the duration of the COVID-19 declaration under Se ction 564(b)(1) of the Act, 21 U.S.C. section 360bbb-3(b)(1), unless the authorization is terminated or revoked sooner.  Performed at [facility], [address], [place], [place] [address]      Time coordinating discharge: 35 minutes  SIGNED: Lanae Boast, MD  [facility] 09/27/2020, 11:58 AM  If 7PM-7AM, please contact night-coverage [email or web address]

## 2020-09-27 ENCOUNTER — Other Ambulatory Visit: Payer: Self-pay | Admitting: Internal Medicine

## 2020-09-27 DIAGNOSIS — I4891 Unspecified atrial fibrillation: Secondary | ICD-10-CM | POA: Diagnosis not present

## 2020-09-27 MED ORDER — METOPROLOL SUCCINATE ER 25 MG PO TB24: 25.0000 mg | ORAL_TABLET | Freq: Every day | ORAL | 0 refills | Status: DC

## 2020-09-27 MED ORDER — DIGOXIN 62.5 MCG PO TABS
0.0625 mg | ORAL_TABLET | Freq: Every day | ORAL | 0 refills | Status: DC
Start: 2020-09-27 — End: 2020-10-09

## 2020-09-27 MED ORDER — ENSURE ENLIVE PO LIQD: 237.0000 mL | Freq: Two times a day (BID) | ORAL | 12 refills | Status: DC

## 2020-09-27 MED FILL — DIGOXIN 0.125 MG TABLET: 125 | 60 days supply | Qty: 30 | Fill #0

## 2020-09-27 MED FILL — METOPROLOL SUCCINATE ER 25: 25 | 30 days supply | Qty: 30 | Fill #0

## 2020-09-27 NOTE — Progress Notes (Signed)
Pt is alert and oriented. Daughter is at the bedside. Discharge instructions/ AVS given to pt and daughter.

## 2020-09-27 NOTE — Care Management (Signed)
1120 09-27-20 Case Manager received a consult regarding heart failure home health screen. Patient is from home with the support of daughter. Patient has durable medical equipment light weight wheelchair that she uses outside of the home when needed. Patient's daughter assists with medications; per patient, no need for home health registered nurse at this time. Gala Lewandowsky, RN,BSN Case Manager

## 2020-09-27 NOTE — Progress Notes (Addendum)
Progress Note  Patient Name: Tammy Walker Date of Encounter: 09/27/2020  [facility] Cardiologist: Donato Schultz, MD   Subjective   Pt feels well this morning. Anxious to discharge home.  Inpatient Medications    Scheduled Meds: . acetaminophen  500 mg Oral TID  . atorvastatin  20 mg Oral QHS  . bisacodyl  10 mg Oral QHS  . digoxin  0.0625 mg Oral Daily  . edoxaban  30 mg Oral QHS  . feeding supplement  237 mL Oral BID BM  . metoprolol succinate  25 mg Oral Daily  . oxyCODONE  10 mg Oral Q12H  . polyethylene glycol  17 g Oral QHS  . pregabalin  25 mg Oral TID   Continuous Infusions: . diltiazem (CARDIZEM) infusion Stopped (09/24/20 0425)   PRN Meds: hydrocortisone, ondansetron (ZOFRAN) IV   Vital Signs    Vitals:   09/26/20 2100 09/27/20 0500 09/27/20 0831 09/27/20 0949  BP: (!) 132/57 120/63  135/63  Pulse: 75 (!) 58  67  Resp: 16 17  20   Temp: 98.2 F (36.8 C) 98 F (36.7 C)  (!) 97.5 F (36.4 C)  TempSrc: Oral Oral  Axillary  SpO2: 96% 92% 94% 98%  Weight:      Height:        Intake/Output Summary (Last 24 hours) at 09/27/2020 1046 Last data filed at 09/27/2020 0500 Gross per 24 hour  Intake --  Output 800 ml  Net -800 ml   Last 3 Weights 09/26/2020 09/25/2020 09/24/2020  Weight (lbs) 126 lb 9.6 oz 124 lb 5.4 oz 125 lb 9.6 oz  Weight (kg) 57.425 kg 56.4 kg 56.972 kg      Telemetry    Sinus rhythm in the 60-70s - Personally Reviewed  ECG    No  New tracings - Personally Reviewed  Physical Exam   GEN: elderly female in no acute distress.   Neck: No JVD Cardiac: RRR, soft systolic murmur  Respiratory: Clear to auscultation bilaterally. GI: Soft, nontender, non-distended  MS: No edema; No deformity. Neuro:  Nonfocal  Psych: Normal affect   Labs    High Sensitivity Troponin:   Recent Labs  Lab 09/23/20 1443 09/23/20 1756 09/23/20 2034 09/23/20 2334 09/24/20 1303  TROPONINIHS 5 7 8 10 9       Chemistry Recent Labs  Lab  09/23/20 1443 09/24/20 0214 09/25/20 0249  NA 138 140 137  K 3.8 4.1 3.9  CL 103 107 107  CO2 23 25 23   GLUCOSE 151* 117* 108*  BUN 14 11 14   CREATININE 0.67 0.79 0.85  CALCIUM 9.4 8.9 8.6*  PROT  --  6.2*  --   ALBUMIN  --  3.2*  --   AST  --  22  --   ALT  --  13  --   ALKPHOS  --  40  --   BILITOT  --  1.8*  --   GFRNONAA >60 >60 >60  ANIONGAP 12 8 7      Hematology Recent Labs  Lab 09/23/20 1443 09/24/20 0214  WBC 11.6* 7.2  RBC 4.98 4.45  HGB 14.3 12.7  HCT 44.0 39.6  MCV 88.4 89.0  MCH 28.7 28.5  MCHC 32.5 32.1  RDW 15.1 15.0  PLT 307 279    BNP Recent Labs  Lab 09/23/20 1443  BNP 397.2*     DDimer  Recent Labs  Lab 09/23/20 2034  DDIMER 0.46     Radiology    No results  found.  Cardiac Studies   Echocardiogram 09/24/2020: Impressions: 1. Left ventricular ejection fraction, by estimation, is 60 to 65%. The  left ventricle has normal function. The left ventricle has no regional  wall motion abnormalities. There is mild left ventricular hypertrophy.  Left ventricular diastolic parameters  are indeterminate.  2. Right ventricular systolic function is normal. The right ventricular  size is normal.  3. Left atrial size was severely dilated.  4. Right atrial size was moderately dilated.  5. The mitral valve is normal in structure. Mild mitral valve  regurgitation. No evidence of mitral stenosis.  6. The aortic valve is tricuspid. Aortic valve regurgitation is mild.  Mild to moderate aortic valve sclerosis/calcification is present, without  any evidence of aortic stenosis.  7. Aortic dilatation noted. There is mild dilatation of the ascending  aorta, measuring 38 mm.  8. IVC not well-visualized. Peak RV-RA gradient 23 mmHg.  9. The patient was in atrial fibrillation.  10. Technically difficult study with poor acoustic windows.   Patient Profile     87 y.o. female with a history of paroxysmal atrial fibrillation on Edoxaban, LBBB,  aortic stenosis, hypertension, hyperlipidemia, GERD, and chronic back pain who is being seen for evaluation of atrial fibrillation and chest pain. Pt converted to sinus rhythm at approximately 0730 on 09/26/20.  Assessment & Plan    PAF with RVR LBBB Chronic anticoagulation - converted to NSR 0730 09/26/20, remains in sinus rhythm - electrolytes, TSH WNL - continue toprol, digoxin, and edoxaban - BP normal - EP was consulted and felt QTc was too long to try tikosyn and recommended amiodarone if rate control still needed - fortunately, she remains in sinus rhythm HR in the 60-70s   Chest pain - resolved with improved HR - hs troponin remained negative  - Echo normal EF and no WMA - defer ischemic evaluation   Chronic diastolic heart failure - BNP 398 - received 40 mg IV lasix OTO - no further diuresis needed - will not discharge on PO lasix - can re-evaluate in office   OK to discharge from cardiology standpoint after evaluation by Dr. Izora Ribas. Follow up has been arranged.     For questions or updates, please contact [facility] Please consult [email or web address] for contact info under        Signed, Marcelino Duster, PA  09/27/2020, 10:46 AM    Personally seen and examined. Agree with APP above with the following comments: Briefly 87 yo F with atrial functionaal MR and PAF Patient notes that she is ready to leave; wishes to shower before DC. Exam notable for holosystolic murmur Personally reviewed relevant tests; still in SR Would recommend - rate control plan as above - AC as above - OK to proceed to cataract surgery [facility]) - may need  PO lasix in future - discussed alcohol cessation around their future boat trip down the [place]  Discussed with Patient , Daughter, and primary MD.  Safe DC.  [name], MD Cardiologist [facility]  [address] [place], [address] [place], [place] [address] [phone]  11:59 AM

## 2020-10-03 NOTE — Progress Notes (Signed)
Cardiology Office Note    Date:  10/09/2020   ID:  Tammy Walker, DOB 03-03-1931, MRN [id number]   PCP:  Wilfrid LundBecker, Anna G, PA   [facility]  Cardiologist:  Donato SchultzMark Skains, MD  Advanced Practice Provider:  No care team member to display Electrophysiologist:  None   [id number]}   No chief complaint on file.   History of Present Illness:  Tammy Walker is a 87 y.o. female with a history of paroxysmal atrial fibrillation on Eliquis, LBBB, aortic valve sclerosis, hypertension, hyperlipidemia, GERD, and chronic back pain who was seen in the hospital for atrial fibrillation and chest pain. Pt converted to sinus rhythm spontaneously on 09/26/20.  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  Seen by EP and they felt QTc was two long to use Tikosyn but could use amiodarone in the future if needed for rate control.  Echo 09/24/2020 normal LVEF 60 to 65% with mild LVH dilatation of the ascending aorta 38 mm.  Patient comes in accompanied by her daughter. She was in Afib at PCP and HR and BP controlled. Patient was asymptomatic. [place] on Friday.    Past Medical History:  Diagnosis Date  . Allergy   . Arrhythmia   . Atrial fibrillation (HCC)   . GERD (gastroesophageal reflux disease)   . History of bone scan    Bone Density Scans 2017 & 2019 Dr. Dutch QuintPoole   . History of chest x-ray    Apart from large hiatus hernia, normal heart, lungs and mediastinum. [facility]    . History of CT scan of abdomen 10/24/2017   Gallstones within a thin-waled gallbladder, [facility]   . History of echocardiogram    [facility]   . History of EKG    Sinus rhythm, borderline 1st deg block, LAD completely changed axis, LBBB(old). [facility]   . Hyperlipidemia   . Hypertension   . Insomnia   . Left lower lobe pneumonia 09/20/2015   [facility]   . Nodule of right lung    [facility],   . Osteoporosis    on fosamax  . Osteoporosis   . Reduced mobility     Past Surgical History:  Procedure Laterality Date  . APPENDECTOMY  [address]1947   [place]   . CT SCAN     2017, 2018, 2019 -- re lungs see records   . TUBAL LIGATION  1978   [facility] [place]     Current Medications: Current Meds  Medication Sig  . acetaminophen (TYLENOL) 500 MG tablet Take 500 mg by mouth 3 (three) times daily.  [name]. atorvastatin (LIPITOR) 20 MG tablet Take 20 mg by mouth at bedtime.  . bisacodyl (DULCOLAX) 5 MG EC tablet Take 10 mg by mouth at bedtime.  . Calcium-Phosphorus-Vitamin D (CALCIUM/D3 ADULT GUMMIES PO) Take 1 tablet by mouth 4 (four) times daily. Calcium 200 mg/ Vitamin D3 500 I.U. - Vitafusion  . calcium-vitamin D (OSCAL WITH D) 500-200 MG-UNIT tablet Take 1 tablet by mouth 2 (two) times daily.  . cetirizine (ZYRTEC) 10 MG tablet Take 10 mg by mouth every morning.  . denosumab (PROLIA) 60 MG/ML SOSY injection Inject 60 mg into the skin every 6 (six) months.  . diclofenac Sodium (VOLTAREN) 1 % GEL Apply 4 g topically 4 (four)  times daily as needed (back pain).  [name] edoxaban (SAVAYSA) 30 MG TABS tablet Take 1 tablet (30 mg total) by mouth daily.  . feeding supplement (ENSURE ENLIVE / ENSURE PLUS) LIQD Take 237 mLs by mouth 2 (two) times daily between meals.  [name] HYDROcodone Bitartrate ER 15 MG CP12 Take 15 mg by mouth 2 (two) times daily.  . Multiple Vitamin (MULTIVITAMIN) tablet Take 1 tablet by mouth daily. Per pa dailytient taking 4 Vitafusion gummies  . oxyCODONE (ROXICODONE) 5 MG/5ML solution Take 2.5 mg by mouth 2 (two) times daily as needed for breakthrough pain.  . polyethylene glycol (MIRALAX / GLYCOLAX) 17 g packet Take 17 g by mouth at bedtime.  . pregabalin (LYRICA) 25 MG capsule Take 25 mg by mouth 3 (three) times daily.  . shark liver oil-cocoa  butter (PREPARATION H) 0.25-3-85.5 % suppository Place 1 suppository rectally as needed for hemorrhoids.  . [DISCONTINUED] digoxin (LANOXIN) 0.125 MG tablet TAKE 1/2 TABLET (0.0625 MG TOTAL) BY MOUTH DAILY.  . [DISCONTINUED] metoprolol succinate (TOPROL-XL) 25 MG 24 hr tablet Take 1 tablet (25 mg total) by mouth daily.  . [DISCONTINUED] metoprolol succinate (TOPROL-XL) 25 MG 24 hr tablet TAKE 1 TABLET (25 MG TOTAL) BY MOUTH DAILY.     Allergies:   Patient has no known allergies.   Social History   Socioeconomic History  . Marital status: Widowed    Spouse name: Not on file  . Number of children: Not on file  . Years of education: Not on file  . Highest education level: Not on file  Occupational History  . Not on file  Tobacco Use  . Smoking status: Never Smoker  . Smokeless tobacco: Never Used  Vaping Use  . Vaping Use: Never used  Substance and Sexual Activity  . Alcohol use: Yes    Comment: less than one drink a week  . Drug use: No  . Sexual activity: Never    Birth control/protection: None  Other Topics Concern  . Not on file  Social History Narrative   Social History      Diet? I watch my intake of salt, sugar, and fat      Do you drink/eat things with caffeine? Yes       Marital status?      Widowed                               What year were you married? 1960      Do you live in a house, apartment, assisted living, condo, trailer, etc.? House       Is it one or more stories? one      How many persons live in your home? 2      Do you have any pets in your home? (please list) yes 2 cats       Highest level of education completed? 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             yes                         Type & how often? Strength & Balance       Advanced Directives      Do you have a living will?  No  Do you have a DNR form?        No                            If not, do you want to discuss one? No       Do you have signed POA/HPOA for forms? Yes       Functional Status      Do you have difficulty bathing or dressing yourself? No       Do you have difficulty preparing food or eating? No       Do you have difficulty managing your medications? No       Do you have difficulty managing your finances? No       Do you have difficulty affording your medications? No       Social Determinants of Corporate investment banker Strain: Not on file  Food Insecurity: Not on file  Transportation Needs: Not on file  Physical Activity: Not on file  Stress: Not on file  Social Connections: Not on file     Family History:  The patient's   family history includes COPD in her mother; Heart disease in her brother, father, and mother; Heart failure in her brother and mother; High blood pressure in her mother; Hyperlipidemia in her daughter; Hypertension in her daughter.   ROS:   Please see the history of present illness.    ROS All other systems reviewed and are negative.   PHYSICAL EXAM:   VS:  BP 124/90   Pulse 74   Ht 5\' 4"  (1.626 m)   Wt 126 lb 12.8 oz (57.5 kg)   SpO2 95%   BMI 21.77 kg/m   Physical Exam  GEN: Well nourished, well developed, in no acute distress  Neck: no JVD, carotid bruits, or masses Cardiac:RRR; 2/6 systolic murmur LSB, No  rubs, or gallops  Respiratory:  clear to auscultation bilaterally, normal work of breathing GI: soft, nontender, nondistended, + BS Ext: without cyanosis, clubbing, or edema, Good distal pulses bilaterally Neuro:  Alert and Oriented x 3 Psych: euthymic mood, full affect  Wt Readings from Last 3 Encounters:  10/09/20 126 lb 12.8 oz (57.5 kg)  09/26/20 126 lb 9.6 oz (57.4 kg)  07/25/20 129 lb (58.5 kg)      Studies/Labs Reviewed:   EKG:  EKG is not ordered today.    Recent Labs: 09/23/2020: B Natriuretic Peptide 397.2; TSH 1.060 09/24/2020: ALT 13; Hemoglobin 12.7; Platelets  279 09/25/2020: BUN 14; Creatinine, Ser 0.85; Magnesium 2.2; Potassium 3.9; Sodium 137   Lipid Panel    Component Value Date/Time   CHOL 213 (H) 08/15/2019 0827   TRIG 128 08/15/2019 0827   HDL 79 08/15/2019 0827   CHOLHDL 2.7 08/15/2019 0827   LDLCALC 110 (H) 08/15/2019 0827    Additional studies/ records that were reviewed today include:  Echocardiogram 09/24/2020: Impressions: 1. Left ventricular ejection fraction, by estimation, is 60 to 65%. The  left ventricle has normal function. The left ventricle has no regional  wall motion abnormalities. There is mild left ventricular hypertrophy.  Left ventricular diastolic parameters  are indeterminate.   2. Right ventricular systolic function is normal. The right ventricular  size is normal.   3. Left atrial size was severely dilated.   4. Right atrial size was moderately dilated.   5. The mitral valve is normal in structure. Mild mitral valve  regurgitation. No evidence of mitral stenosis.  6. The aortic valve is tricuspid. Aortic valve regurgitation is mild.  Mild to moderate aortic valve sclerosis/calcification is present, without  any evidence of aortic stenosis.   7. Aortic dilatation noted. There is mild dilatation of the ascending  aorta, measuring 38 mm.   8. IVC not well-visualized. Peak RV-RA gradient 23 mmHg.   9. The patient was in atrial fibrillation.  10. Technically difficult study with poor acoustic windows.       Risk Assessment/Calculations:    CHA2DS2-VASc Score = 4  This indicates a 4.8% annual risk of stroke. The patient's score is based upon: CHF History: No HTN History: Yes Diabetes History: No Stroke History: No Vascular Disease History: No Age Score: 2 Gender Score: 1        ASSESSMENT:    1. Paroxysmal atrial fibrillation (HCC)   2. Chest pain, unspecified type   3. Chronic diastolic CHF (congestive heart failure) (HCC)   4. Essential hypertension   5. Hyperlipidemia, unspecified  hyperlipidemia type      PLAN:  In order of problems listed above:  PAF with recent hospitalization with RVR on toprol, Dig, Eliquis. EP saw and QTc to long for tikosyn. They recommend amiodarone if rate control needed.  This was previously stopped 08/2019 by eye doctor for vortex keratopathy-recurrent Afib at PCP but rate controlled and patient asymptomatic. Can take extra toprol if needed for elevated HR  Chest pain resoved with HR controlled  Chronic diastolic CHF compensated  HTN controlled  HLD  Shared Decision Making/Informed Consent        Medication Adjustments/Labs and Tests Ordered: Current medicines are reviewed at length with the patient today.  Concerns regarding medicines are outlined above.  Medication changes, Labs and Tests ordered today are listed in the Patient Instructions below. Patient Instructions  Medication Instructions:   Your physician recommends that you continue on your current medications as directed. Please refer to the Current Medication list given to you today.  *If you need a refill on your cardiac medications before your next appointment, please call your pharmacy*   Follow-Up:  3 MONTHS IN THE OFFICE WITH DR. Anne Fu     Signed, Jacolyn Reedy, PA-C  10/09/2020 10:24 AM    [facility] [address] [place], [place], [place]  [address] Phone: [phone]; Fax: [phone]

## 2020-10-04 ENCOUNTER — Telehealth: Payer: Self-pay | Admitting: Physician Assistant

## 2020-10-04 NOTE — Telephone Encounter (Signed)
Called and spoke with Raynelle Fanning - pt's daughter.  She have just wanted to make sure we were aware this episode had occurred.  She reports pt is doing well, taking her medications as RXed and they will call if any questions or concerns.  She will continue to monitor her VS and will f/u as scheduled on Tuesday 10/09/2020.

## 2020-10-04 NOTE — Telephone Encounter (Signed)
    PA Horton Marshall calling, she said she saw pt today, pt was recently hospitalized due to Afib, pt was cardioverted herself and got home, however, during the pt's visit with her today, pt she is back in Afib, pt is asymptomatic, pt's BP and HR are good, no edema and no sob. Tobi Bastos said pt's daughter wants her to call us to let us know that pt is back to Afib so we are aware before seeing the pt on 10/09/20

## 2020-10-09 ENCOUNTER — Other Ambulatory Visit: Payer: Self-pay

## 2020-10-09 ENCOUNTER — Ambulatory Visit (INDEPENDENT_AMBULATORY_CARE_PROVIDER_SITE_OTHER): Payer: Medicare Other | Admitting: Physician Assistant

## 2020-10-09 ENCOUNTER — Encounter: Payer: Self-pay | Admitting: Physician Assistant

## 2020-10-09 VITALS — BP 124/90 | HR 74 | Ht 64.0 in | Wt 126.8 lb

## 2020-10-09 DIAGNOSIS — I480 Paroxysmal atrial fibrillation: Secondary | ICD-10-CM

## 2020-10-09 DIAGNOSIS — I10 Essential (primary) hypertension: Secondary | ICD-10-CM

## 2020-10-09 DIAGNOSIS — R079 Chest pain, unspecified: Secondary | ICD-10-CM

## 2020-10-09 DIAGNOSIS — I5032 Chronic diastolic (congestive) heart failure: Secondary | ICD-10-CM

## 2020-10-09 DIAGNOSIS — E785 Hyperlipidemia, unspecified: Secondary | ICD-10-CM

## 2020-10-09 MED ORDER — METOPROLOL SUCCINATE ER 25 MG PO TB24: 25.0000 mg | ORAL_TABLET | Freq: Every day | ORAL | 2 refills | Status: DC

## 2020-10-09 MED ORDER — DIGOXIN 125 MCG PO TABS: ORAL_TABLET | ORAL | 2 refills | Status: DC

## 2020-10-09 NOTE — Patient Instructions (Signed)
Medication Instructions:   Your physician recommends that you continue on your current medications as directed. Please refer to the Current Medication list given to you today.  *If you need a refill on your cardiac medications before your next appointment, please call your pharmacy*    Follow-Up:  3 MONTHS IN THE OFFICE WITH DR. SKAINS  

## 2020-10-19 ENCOUNTER — Ambulatory Visit: Payer: Medicare Other | Admitting: Internal Medicine

## 2020-11-06 ENCOUNTER — Ambulatory Visit (INDEPENDENT_AMBULATORY_CARE_PROVIDER_SITE_OTHER): Payer: Medicare Other | Admitting: Internal Medicine

## 2020-11-06 ENCOUNTER — Encounter: Payer: Self-pay | Admitting: Internal Medicine

## 2020-11-06 ENCOUNTER — Other Ambulatory Visit: Payer: Self-pay

## 2020-11-06 VITALS — BP 150/84 | HR 70 | Ht 64.0 in | Wt 128.0 lb

## 2020-11-06 DIAGNOSIS — I480 Paroxysmal atrial fibrillation: Secondary | ICD-10-CM

## 2020-11-06 NOTE — Progress Notes (Signed)
HPI Tammy Walker returns today for followup. She is a pleasant 87 yo woman with a long h/o atrial fib who had been well controlled on amiodarone. She developed corneal deposits and her amio was stopped and then she developed atrial fib with a RVR. I saw her with this problem in the hospital in March. She was placed on digoxin. She saw Herma Carson in April with a RVR of 99/min. She has been maintained on digoxin and toprol.  She has reverted back to NSR.  No Known Allergies   Current Outpatient Medications  Medication Sig Dispense Refill  . acetaminophen (TYLENOL) 500 MG tablet Take 500 mg by mouth 3 (three) times daily.    [name] atorvastatin (LIPITOR) 20 MG tablet Take 20 mg by mouth at bedtime.    . bisacodyl (DULCOLAX) 5 MG EC tablet Take 10 mg by mouth at bedtime.    . Calcium-Phosphorus-Vitamin D (CALCIUM/D3 ADULT GUMMIES PO) Take 1 tablet by mouth 4 (four) times daily. Calcium 200 mg/ Vitamin D3 500 I.U. - Vitafusion    . calcium-vitamin D (OSCAL WITH D) 500-200 MG-UNIT tablet Take 1 tablet by mouth 2 (two) times daily. 60 tablet 3  . cetirizine (ZYRTEC) 10 MG tablet Take 10 mg by mouth every morning.    . clotrimazole (LOTRIMIN) 1 % cream 1 application    . denosumab (PROLIA) 60 MG/ML SOSY injection Inject 60 mg into the skin every 6 (six) months.    . diclofenac Sodium (VOLTAREN) 1 % GEL Apply 4 g topically 4 (four) times daily as needed (back pain).    [name] digoxin (LANOXIN) 0.125 MG tablet TAKE 1/2 TABLET (0.0625 MG TOTAL) BY MOUTH DAILY. 45 tablet 2  . edoxaban (SAVAYSA) 30 MG TABS tablet Take 1 tablet (30 mg total) by mouth daily. 90 tablet 3  . HYDROcodone Bitartrate ER 15 MG CP12 Take 15 mg by mouth 2 (two) times daily.    . metoprolol succinate (TOPROL-XL) 25 MG 24 hr tablet Take 1 tablet (25 mg total) by mouth daily. 90 tablet 2  . oxyCODONE (ROXICODONE) 5 MG/5ML solution Take 2.5 mg by mouth 2 (two) times daily as needed for breakthrough pain.    . polyethylene glycol  (MIRALAX / GLYCOLAX) 17 g packet Take 17 g by mouth at bedtime.    . pregabalin (LYRICA) 25 MG capsule Take 25 mg by mouth 3 (three) times daily.    . shark liver oil-cocoa butter (PREPARATION H) 0.25-3-85.5 % suppository Place 1 suppository rectally as needed for hemorrhoids.     No current facility-administered medications for this visit.     Past Medical History:  Diagnosis Date  . Allergy   . Arrhythmia   . Atrial fibrillation (HCC)   . GERD (gastroesophageal reflux disease)   . History of bone scan    Bone Density Scans 2017 & 2019 Dr. Dutch Quint   . History of chest x-ray    Apart from large hiatus hernia, normal heart, lungs and mediastinum. [facility] from [facility]    . History of CT scan of abdomen 10/24/2017   Gallstones within a thin-waled gallbladder, Per records from [facility]   . History of echocardiogram    [facility]   . History of EKG    Sinus rhythm, borderline 1st deg block, LAD completely changed axis, LBBB(old). Per [facility]   . Hyperlipidemia   . Hypertension   . Insomnia   .  Left lower lobe pneumonia 09/20/2015   Per [facility] from [facility]   . Nodule of right lung    Per records from [facility],   . Osteoporosis    on fosamax  . Osteoporosis   . Reduced mobility     ROS:   All systems reviewed and negative except as noted in the HPI.   Past Surgical History:  Procedure Laterality Date  . APPENDECTOMY  [address], [place]   . CT SCAN     2017, 2018, 2019 -- re lungs see records   . TUBAL LIGATION  1978   [facility], [place], [place]      Family History  Problem Relation Age of Onset  . Heart disease Mother   . High blood pressure Mother   . COPD Mother   . Heart failure Mother   . Heart disease Father   . Heart disease Brother   . Heart failure Brother   . Hyperlipidemia Daughter   . Hypertension Daughter       Social History   Socioeconomic History  . Marital status: Widowed    Spouse name: Not on file  . Number of children: Not on file  . Years of education: Not on file  . Highest education level: Not on file  Occupational History  . Not on file  Tobacco Use  . Smoking status: Never Smoker  . Smokeless tobacco: Never Used  Vaping Use  . Vaping Use: Never used  Substance and Sexual Activity  . Alcohol use: Yes    Comment: less than one drink a week  . Drug use: No  . Sexual activity: Never    Birth control/protection: None  Other Topics Concern  . Not on file  Social History Narrative   Social History      Diet? I watch my intake of salt, sugar, and fat      Do you drink/eat things with caffeine? Yes       Marital status?      Widowed                               What year were you married? 1960      Do you live in a house, apartment, assisted living, condo, trailer, etc.? House       Is it one or more stories? one      How many persons live in your home? 2      Do you have any pets in your home? (please list) yes 2 cats       Highest level of education completed? 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             yes                         Type & how often? Strength & Balance       Advanced Directives      Do you have a living will?  No       Do you have a DNR form?        No  If not, do you want to discuss one? No       Do you have signed POA/HPOA for forms? Yes       Functional Status      Do you have difficulty bathing or dressing yourself? No       Do you have difficulty preparing food or eating? No       Do you have difficulty managing your medications? No       Do you have difficulty managing your finances? No       Do you have difficulty affording your medications? No       Social Determinants of Research scientist (physical sciences) Strain: Not on file  Food Insecurity: Not on file  Transportation Needs: Not on file  Physical Activity: Not on file  Stress: Not on file  Social Connections: Not on file  Intimate Partner Violence: Not on file     BP (!) 150/84   Pulse 70   Ht 5\' 4"  (1.626 m)   Wt 128 lb (58.1 kg)   SpO2 95%   BMI 21.97 kg/m   Physical Exam:  Well appearing NAD HEENT: Unremarkable Neck:  No JVD, no thyromegally Lymphatics:  No adenopathy Back:  No CVA tenderness Lungs:  Clear with no wheezes HEART:  Regular rate rhythm, no murmurs, no rubs, no clicks Abd:  soft, positive bowel sounds, no organomegally, no rebound, no guarding Ext:  2 plus pulses, no edema, no cyanosis, no clubbing Skin:  No rashes no nodules Neuro:  CN II through XII intact, motor grossly intact  EKG - nsr with LBBB, normal QT   Assess/Plan: 1. Persistent atrial fib - she has reverted back to NSR. She is not on any AA drug. Her QT interval is acceptable if we were to need to try dofetilide. Because she does not feel her atrial fib, I have recommended she undergo watchful waiting. 2. Coags - she will continue her edoxaban.  3. Chronic diastolic heart failure - in NSR her symptoms are class 2A. She will continue her current meds. 4. HTN - her bp is up a bit today. I encouraged her to avoid salty foods.  Tammy Cavenaugh,MD

## 2020-11-06 NOTE — Patient Instructions (Addendum)
Medication Instructions:  Your physician recommends that you continue on your current medications as directed. Please refer to the Current Medication list given to you today.  Labwork: None ordered.  Testing/Procedures: None ordered.  Follow-Up: Your physician wants you to follow-up in: as needed with Gregg Taylor, MD    Any Other Special Instructions Will Be Listed Below (If Applicable).  If you need a refill on your cardiac medications before your next appointment, please call your pharmacy.       

## 2021-01-11 ENCOUNTER — Ambulatory Visit: Payer: Medicare Other | Admitting: Cardiology

## 2021-04-02 ENCOUNTER — Other Ambulatory Visit: Payer: Self-pay

## 2021-04-02 ENCOUNTER — Ambulatory Visit (INDEPENDENT_AMBULATORY_CARE_PROVIDER_SITE_OTHER): Payer: Medicare Other | Admitting: Cardiology

## 2021-04-02 DIAGNOSIS — I700 Atherosclerosis of aorta: Secondary | ICD-10-CM | POA: Diagnosis not present

## 2021-04-02 DIAGNOSIS — I480 Paroxysmal atrial fibrillation: Secondary | ICD-10-CM

## 2021-04-02 DIAGNOSIS — I350 Nonrheumatic aortic (valve) stenosis: Secondary | ICD-10-CM | POA: Diagnosis not present

## 2021-04-02 DIAGNOSIS — I447 Left bundle-branch block, unspecified: Secondary | ICD-10-CM | POA: Insufficient documentation

## 2021-04-02 DIAGNOSIS — Z7901 Long term (current) use of anticoagulants: Secondary | ICD-10-CM | POA: Diagnosis not present

## 2021-04-02 NOTE — Patient Instructions (Signed)
Medication Instructions:  The current medical regimen is effective;  continue present plan and medications.  *If you need a refill on your cardiac medications before your next appointment, please call your pharmacy*  Follow-Up: At [facility], you and your health needs are our priority.  As part of our continuing mission to provide you with exceptional heart care, we have created designated Provider Care Teams.  These Care Teams include your primary Cardiologist (physician) and Advanced Practice Providers (APPs -  Physician Assistants and Nurse Practitioners) who all work together to provide you with the care you need, when you need it.  We recommend signing up for the patient portal called "MyChart".  Sign up information is provided on this After Visit Summary.  MyChart is used to connect with patients for Virtual Visits (Telemedicine).  Patients are able to view lab/test results, encounter notes, upcoming appointments, etc.  Non-urgent messages can be sent to your provider as well.   To learn more about what you can do with MyChart, go to [email or web address].    Your next appointment:   6 month(s)  The format for your next appointment:   In Person  Provider:   You will see one of the following Advanced Practice Providers on your designated Care Team:   Herma Carson, [place]  Then, Donato Schultz, MD will plan to see you again in 1 year(s).   Thank you for choosing  [facility]!!

## 2021-04-02 NOTE — Assessment & Plan Note (Signed)
In March 2021 we stopped her amiodarone because of high deposition noted by ophthalmologist.  She was doing well up until 2022 where she did develop atrial fibrillation with RVR which was transient.  Digoxin and metoprolol were utilized and she converted.  Appreciate Dr. Lubertha Basque assistance as well.  Continue with current medication management with both metoprolol 25 mg daily as well as digoxin 0.0625 mg daily dose adjusted for age.  If she does have another episode of atrial relation she knows she can take an additional metoprolol.

## 2021-04-02 NOTE — Progress Notes (Signed)
Cardiology Office Note   Date:  04/02/2021   ID:  Tammy, Walker [birth year], MRN [id number]  PCP:  Wilfrid Lund, PA  Cardiologist:  Donato Schultz, MD  Electrophysiologist:  None   Evaluation Performed:  Follow-Up Visit   History of Present Illness:    Tammy Walker is a 87 y.o. female here for the follow-up of atrial fibrillation and congestive heart failure prior ER visit secondary to atrial fibrillation RVR.  Has seen Dr. Ladona Walker in EP.  Moved from [place] after her husband died.  She grew up in [place].  She is retired Furniture conservator/restorer and Conservation officer, nature.  She now resides here with her daughter.  She was taking amiodarone 200 mg a day for several years as well as edoxaban 30 mg a day for anticoagulation.  We stopped her amiodarone in March or 2021 because of eye changes seen by ophthalmology.  Unfortunately, atrial fibrillation with RVR returned in 2022.  Echocardiogram showed EF of 60% with mild aortic stenosis and mild aortic regurgitation with dilated ascending aorta.  Mild to moderate mitral regurgitation.  Her atrial fibrillation was discovered in 2002.  She had been on amiodarone since then, it was stopped in March 2021.  She had required a cardioversion earlier on.  Rarely will get substernal chest discomfort at the end of the day.  She will use a hot water bottle or ibuprofen gel to help.  This has been going on for years.  She has quite a bit of bone pain she states.  She and her husband had walked a marathon back in her 12s.  Today:  She followed up with Dr. Ladona Walker in 11/06/2020. At that time she had reverted back to NSR. Typically she is not able to tell if she is in rhythm or not.  She is accompanied by a family member. Overall, she continues to have consistent, "bearable" myalgias mostly due to her back. She also has bone/muscular pain above her sternum. For treatment she sometimes holds a hot drink on the area which helps somewhat. Additionally she  tries to get up and walk around to help manage the pain.  She denies any palpitations, or shortness of breath. No lightheadedness, headaches, syncope, orthopnea, or PND. Also has no lower extremity edema or exertional symptoms.   Past Medical History:  Diagnosis Date   Allergy    Arrhythmia    Atrial fibrillation (HCC)    GERD (gastroesophageal reflux disease)    History of bone scan    Bone Density Scans 2017 & 2019 Dr. Dutch Quint    History of chest x-ray    Apart from large hiatus hernia, normal heart, lungs and mediastinum. Per [facility] from [facility]     History of CT scan of abdomen 10/24/2017   Gallstones within a thin-waled gallbladder, Per [facility]    History of echocardiogram    [facility]    History of EKG    Sinus rhythm, borderline 1st deg block, LAD completely changed axis, LBBB(old). Per records from [facility]    Hyperlipidemia    Hypertension    Insomnia    Left lower lobe pneumonia 09/20/2015   Per records from [facility]    Nodule of right lung    Per records from [facility],    Osteoporosis    on fosamax   Osteoporosis    Reduced mobility    Past Surgical History:  Procedure Laterality Date   APPENDECTOMY  [address] [place]    CT SCAN     2017, 2018, 2019 -- re lungs see records    TUBAL LIGATION  1978   [place], [place], [place]      Current Meds  Medication Sig   acetaminophen (TYLENOL) 500 MG tablet Take 500 mg by mouth 3 (three) times daily.   atorvastatin (LIPITOR) 20 MG tablet Take 20 mg by mouth at bedtime.   bisacodyl (DULCOLAX) 5 MG EC tablet Take 10 mg by mouth at bedtime.   Calcium-Phosphorus-Vitamin D (CALCIUM/D3 ADULT GUMMIES PO) Take 1 tablet by mouth 4 (four) times daily. Calcium 200 mg/ Vitamin D3 500 I.U. - Vitafusion   calcium-vitamin D (OSCAL WITH D) 500-200 MG-UNIT tablet Take 1 tablet by mouth 2  (two) times daily.   cetirizine (ZYRTEC) 10 MG tablet Take 10 mg by mouth every morning.   clotrimazole (LOTRIMIN) 1 % cream 1 application   denosumab (PROLIA) 60 MG/ML SOSY injection Inject 60 mg into the skin every 6 (six) months.   diclofenac Sodium (VOLTAREN) 1 % GEL Apply 4 g topically 4 (four) times daily as needed (back pain).   digoxin (LANOXIN) 0.125 MG tablet TAKE 1/2 TABLET (0.0625 MG TOTAL) BY MOUTH DAILY.   edoxaban (SAVAYSA) 30 MG TABS tablet Take 1 tablet (30 mg total) by mouth daily.   HYDROcodone Bitartrate ER 15 MG CP12 Take 15 mg by mouth 2 (two) times daily.   metoprolol succinate (TOPROL-XL) 25 MG 24 hr tablet Take 1 tablet (25 mg total) by mouth daily.   oxyCODONE (ROXICODONE) 5 MG/5ML solution Take 2.5 mg by mouth 2 (two) times daily as needed for breakthrough pain.   polyethylene glycol (MIRALAX / GLYCOLAX) 17 g packet Take 17 g by mouth at bedtime.   pregabalin (LYRICA) 25 MG capsule Take 25 mg by mouth 3 (three) times daily.   shark liver oil-cocoa butter (PREPARATION H) 0.25-3-85.5 % suppository Place 1 suppository rectally as needed for hemorrhoids.     Allergies:   Patient has no known allergies.   Social History   Tobacco Use   Smoking status: Never   Smokeless tobacco: Never  Vaping Use   Vaping Use: Never used  Substance Use Topics   Alcohol use: Yes    Comment: less than one drink a week   Drug use: No     Family Hx: The patient's family history includes COPD in her mother; Heart disease in her brother, father, and mother; Heart failure in her brother and mother; High blood pressure in her mother; Hyperlipidemia in her daughter; Hypertension in her daughter.  ROS:   Please see the history of present illness.    (+) Myalgias (+) Back pain (+) Chest pain at sternum All other systems reviewed and are negative.   Prior CV studies:   The following studies were reviewed today:  Echocardiogram as described above showed normal EF mild aortic  stenosis mild to moderate mitral regurgitation  Echo 09/24/2020:  1. Left ventricular ejection fraction, by estimation, is 60 to 65%. The  left ventricle has normal function. The left ventricle has no regional  wall motion abnormalities. There is mild left ventricular hypertrophy.  Left ventricular diastolic parameters  are indeterminate.   2. Right ventricular systolic function is normal. The right ventricular  size is normal.   3. Left atrial size was severely dilated.   4. Right atrial size was moderately dilated.   5. The mitral valve  is normal in structure. Mild mitral valve  regurgitation. No evidence of mitral stenosis.   6. The aortic valve is tricuspid. Aortic valve regurgitation is mild.  Mild to moderate aortic valve sclerosis/calcification is present, without  any evidence of aortic stenosis.   7. Aortic dilatation noted. There is mild dilatation of the ascending  aorta, measuring 38 mm.   8. IVC not well-visualized. Peak RV-RA gradient 23 mmHg.   9. The patient was in atrial fibrillation.  10. Technically difficult study with poor acoustic windows. Tec   Labs/Other Tests and Data Reviewed:    EKG:   EKG is personally reviewed and interpreted. 04/02/2021: EKG was not ordered today. 05/23/2019: sinus rhythm first-degree AV block 230 ms with left bundle branch block.  Recent Labs: 09/23/2020: B Natriuretic Peptide 397.2; TSH 1.060 09/24/2020: ALT 13; Hemoglobin 12.7; Platelets 279 09/25/2020: BUN 14; Creatinine, Ser 0.85; Magnesium 2.2; Potassium 3.9; Sodium 137   Recent Lipid Panel Lab Results  Component Value Date/Time   CHOL 213 (H) 08/15/2019 08:27 AM   TRIG 128 08/15/2019 08:27 AM   HDL 79 08/15/2019 08:27 AM   CHOLHDL 2.7 08/15/2019 08:27 AM   LDLCALC 110 (H) 08/15/2019 08:27 AM    Wt Readings from Last 3 Encounters:  04/02/21 125 lb 1.6 oz (56.7 kg)  11/06/20 128 lb (58.1 kg)  10/09/20 126 lb 12.8 oz (57.5 kg)     Risk Assessment/Calculations:       Objective:     VS:  BP (!) 142/82   Pulse 69   Ht 5\' 4"  (1.626 m)   Wt 125 lb 1.6 oz (56.7 kg)   PF 97 L/min   BMI 21.47 kg/m     Wt Readings from Last 3 Encounters:  04/02/21 125 lb 1.6 oz (56.7 kg)  11/06/20 128 lb (58.1 kg)  10/09/20 126 lb 12.8 oz (57.5 kg)     GEN: Well nourished, well developed in no acute distress HEENT: Normal NECK: No JVD; No carotid bruits LYMPHATICS: No lymphadenopathy CARDIAC: RRR, no murmurs, rubs, gallops RESPIRATORY:  Clear to auscultation without rales, wheezing or rhonchi  ABDOMEN: Soft, non-tender, non-distended MUSCULOSKELETAL:  No edema; No deformity  SKIN: Warm and dry NEUROLOGIC:  Alert and oriented x 3 PSYCHIATRIC:  Normal affect    ASSESSMENT & PLAN:    Paroxysmal atrial fibrillation (HCC) In March 2021 we stopped her amiodarone because of high deposition noted by ophthalmologist.  She was doing well up until 2022 where she did develop atrial fibrillation with RVR which was transient.  Digoxin and metoprolol were utilized and she converted.  Appreciate Dr. 2023 assistance as well.  Continue with current medication management with both metoprolol 25 mg daily as well as digoxin 0.0625 mg daily dose adjusted for age.  If she does have another episode of atrial relation she knows she can take an additional metoprolol.  Chronic anticoagulation Continue with edoxaban 30 mg once a day.  She used to take this in [place].  We carried it over.  No bleeding.  Last hemoglobin 14.4 creatinine 0.62.  Mild aortic stenosis Continuing to monitor with echocardiogram.  Aortic atherosclerosis (HCC) Seen on CT scan in 2014, interpretation noted.  Abdominal aorta.  Continue with good overall blood pressure control.  Also on atorvastatin 20 mg daily.  Excellent.  Last LDL 100.  Left bundle branch block Chronic, no changes.  No syncope.  She is off of amiodarone.  May require pacemaker in the future if conduction system deteriorates.  Medication  Adjustments/Labs and Tests Ordered: Current medicines are reviewed at length with the patient today.  Concerns regarding medicines are outlined above.   Tests Ordered: No orders of the defined types were placed in this encounter.  Medication Changes: No orders of the defined types were placed in this encounter.   Follow Up: 6 months with Marcelino Duster, 12 months with me  I,Mathew Stumpf,acting as a scribe for [employer], MD.,have documented all relevant documentation on the behalf of Donato Schultz, MD,as directed by  Donato Schultz, MD while in the presence of Donato Schultz, MD.  I, Donato Schultz, MD, have reviewed all documentation for this visit. The documentation on 04/02/21 for the exam, diagnosis, procedures, and orders are all accurate and complete.   Signed, Donato Schultz, MD  04/02/2021 11:14 AM    [facility]

## 2021-04-02 NOTE — Assessment & Plan Note (Signed)
Continue with edoxaban 30 mg once a day.  She used to take this in [place].  We carried it over.  No bleeding.  Last hemoglobin 14.4 creatinine 0.62.

## 2021-04-02 NOTE — Assessment & Plan Note (Signed)
Continuing to monitor with echocardiogram.

## 2021-04-02 NOTE — Assessment & Plan Note (Signed)
Seen on CT scan in 2014, interpretation noted.  Abdominal aorta.  Continue with good overall blood pressure control.  Also on atorvastatin 20 mg daily.  Excellent.  Last LDL 100.

## 2021-04-02 NOTE — Assessment & Plan Note (Signed)
Chronic, no changes.  No syncope.  She is off of amiodarone.  May require pacemaker in the future if conduction system deteriorates.

## 2021-04-22 ENCOUNTER — Ambulatory Visit (HOSPITAL_COMMUNITY): Payer: Medicare Other | Attending: Cardiovascular Disease

## 2021-04-22 ENCOUNTER — Other Ambulatory Visit: Payer: Self-pay

## 2021-04-22 DIAGNOSIS — I447 Left bundle-branch block, unspecified: Secondary | ICD-10-CM

## 2021-04-22 DIAGNOSIS — I480 Paroxysmal atrial fibrillation: Secondary | ICD-10-CM | POA: Diagnosis not present

## 2021-04-22 LAB — ECHOCARDIOGRAM COMPLETE
Area-P 1/2: 3.48 cm2
P 1/2 time: 712 msec
S' Lateral: 2.3 cm

## 2021-04-22 MED ORDER — PERFLUTREN LIPID MICROSPHERE: 1.0000 mL | INTRAVENOUS | Status: DC | PRN

## 2021-04-23 ENCOUNTER — Telehealth: Payer: Self-pay | Admitting: Cardiology

## 2021-04-23 NOTE — Telephone Encounter (Signed)
Patient's daughter is returning call to discuss lab results. 

## 2021-04-23 NOTE — Telephone Encounter (Signed)
Reviewed results of patient's echo with daughter Darel Hong per DPR. Darel Hong verbalized understanding and thanked me for the call.

## 2021-05-28 ENCOUNTER — Telehealth: Payer: Self-pay | Admitting: Cardiology

## 2021-05-28 NOTE — Telephone Encounter (Signed)
Pt c/o BP issue: STAT if pt c/o blurred vision, one-sided weakness or slurred speech  1. What are your last 5 BP readings? 2:23 pm 94/72, 2:28 pm 97/60, slept, 3:24 pm 80/49, 4:17 pm 77/45   2. Are you having any other symptoms (ex. Dizziness, headache, blurred vision, passed out)? Tired and weak  3. What is your BP issue? Patient's daughter states she is supposed to call the office if the patient's BP got below 100/50. She states it has stayed low for th past 2 hours. She says she did check the patient's oxygen which was 97%. She says the patient has been tired and weak today, but did not sleep well and has not eaten much.

## 2021-05-28 NOTE — Telephone Encounter (Signed)
Spoke with pt's daughter, DPR who reports pt's BP is low today.  Pt has not eaten very much and has limited fluid intake today.  Daughter reports she has taken BP with pt lying down mostly. Denies current CP or SOB. Pt's current BP sitting is 103/81 with HR 66.  [name] advised best time to take BP is 1-2 hours after morning medications, pt sitting with both feet on the floor and arm at heart level.  Advised if pt's BP below 100/60 may give a salty snack and ensure pt is well hydrated.  She will ensure pt eats a meal this evening.  [name] verbalizes understanding and agrees with current plan.

## 2021-06-20 ENCOUNTER — Other Ambulatory Visit: Payer: Self-pay | Admitting: Physician Assistant

## 2021-07-02 ENCOUNTER — Other Ambulatory Visit: Payer: Self-pay | Admitting: Physician Assistant

## 2021-07-10 ENCOUNTER — Other Ambulatory Visit: Payer: Self-pay | Admitting: Cardiology

## 2021-07-10 DIAGNOSIS — I480 Paroxysmal atrial fibrillation: Secondary | ICD-10-CM

## 2021-07-10 NOTE — Telephone Encounter (Signed)
Savaysa 30mg  refill request received. Pt is 88 years old, weight-56.7kg, Crea-0.85 on 09/05/2020, last seen by Dr. Marlou Porch on 04/02/2021, Diagnosis-Afib, CrCl-40.29ml/min; Dose is appropriate based on dosing criteria. Will send in refill to requested pharmacy.

## 2021-09-05 ENCOUNTER — Emergency Department (HOSPITAL_COMMUNITY): Payer: Medicare Other

## 2021-09-05 ENCOUNTER — Other Ambulatory Visit: Payer: Self-pay

## 2021-09-05 ENCOUNTER — Inpatient Hospital Stay (HOSPITAL_COMMUNITY)
Admission: EM | Admit: 2021-09-05 | Discharge: 2021-09-09 | DRG: 388 | Disposition: A | Discharge status: Home Health | Payer: Medicare Other | Attending: Internal Medicine | Admitting: Internal Medicine

## 2021-09-05 ENCOUNTER — Encounter (HOSPITAL_COMMUNITY): Payer: Self-pay | Admitting: Emergency Medicine

## 2021-09-05 DIAGNOSIS — M4856XA Collapsed vertebra, not elsewhere classified, lumbar region, initial encounter for fracture: Secondary | ICD-10-CM | POA: Diagnosis present

## 2021-09-05 DIAGNOSIS — Z20822 Contact with and (suspected) exposure to covid-19: Secondary | ICD-10-CM | POA: Diagnosis present

## 2021-09-05 DIAGNOSIS — I10 Essential (primary) hypertension: Secondary | ICD-10-CM

## 2021-09-05 DIAGNOSIS — K56609 Unspecified intestinal obstruction, unspecified as to partial versus complete obstruction: Secondary | ICD-10-CM

## 2021-09-05 DIAGNOSIS — M810 Age-related osteoporosis without current pathological fracture: Secondary | ICD-10-CM | POA: Diagnosis present

## 2021-09-05 DIAGNOSIS — J690 Pneumonitis due to inhalation of food and vomit: Secondary | ICD-10-CM | POA: Diagnosis present

## 2021-09-05 DIAGNOSIS — E872 Acidosis, unspecified: Secondary | ICD-10-CM | POA: Diagnosis present

## 2021-09-05 DIAGNOSIS — Z8249 Family history of ischemic heart disease and other diseases of the circulatory system: Secondary | ICD-10-CM

## 2021-09-05 DIAGNOSIS — G8929 Other chronic pain: Secondary | ICD-10-CM | POA: Diagnosis present

## 2021-09-05 DIAGNOSIS — Z7901 Long term (current) use of anticoagulants: Secondary | ICD-10-CM | POA: Diagnosis not present

## 2021-09-05 DIAGNOSIS — I480 Paroxysmal atrial fibrillation: Secondary | ICD-10-CM | POA: Diagnosis present

## 2021-09-05 DIAGNOSIS — K449 Diaphragmatic hernia without obstruction or gangrene: Secondary | ICD-10-CM

## 2021-09-05 DIAGNOSIS — E785 Hyperlipidemia, unspecified: Secondary | ICD-10-CM | POA: Diagnosis present

## 2021-09-05 DIAGNOSIS — M545 Low back pain, unspecified: Secondary | ICD-10-CM

## 2021-09-05 DIAGNOSIS — K56600 Partial intestinal obstruction, unspecified as to cause: Principal | ICD-10-CM | POA: Diagnosis present

## 2021-09-05 DIAGNOSIS — K219 Gastro-esophageal reflux disease without esophagitis: Secondary | ICD-10-CM | POA: Diagnosis present

## 2021-09-05 DIAGNOSIS — E860 Dehydration: Secondary | ICD-10-CM | POA: Diagnosis present

## 2021-09-05 DIAGNOSIS — J9601 Acute respiratory failure with hypoxia: Secondary | ICD-10-CM | POA: Diagnosis present

## 2021-09-05 DIAGNOSIS — Z79899 Other long term (current) drug therapy: Secondary | ICD-10-CM

## 2021-09-05 DIAGNOSIS — K649 Unspecified hemorrhoids: Secondary | ICD-10-CM | POA: Diagnosis present

## 2021-09-05 DIAGNOSIS — R0902 Hypoxemia: Secondary | ICD-10-CM | POA: Diagnosis present

## 2021-09-05 DIAGNOSIS — Z825 Family history of asthma and other chronic lower respiratory diseases: Secondary | ICD-10-CM

## 2021-09-05 DIAGNOSIS — I4891 Unspecified atrial fibrillation: Secondary | ICD-10-CM

## 2021-09-05 DIAGNOSIS — Z5181 Encounter for therapeutic drug level monitoring: Secondary | ICD-10-CM

## 2021-09-05 LAB — CBC WITH DIFFERENTIAL/PLATELET
Abs Immature Granulocytes: 0.05 10*3/uL (ref 0.00–0.07)
Basophils Absolute: 0 10*3/uL (ref 0.0–0.1)
Basophils Relative: 0 %
Eosinophils Absolute: 0 10*3/uL (ref 0.0–0.5)
Eosinophils Relative: 0 %
HCT: 50.5 % — ABNORMAL HIGH (ref 36.0–46.0)
Hemoglobin: 15.9 g/dL — ABNORMAL HIGH (ref 12.0–15.0)
Immature Granulocytes: 1 %
Lymphocytes Relative: 10 %
Lymphs Abs: 1 10*3/uL (ref 0.7–4.0)
MCH: 29 pg (ref 26.0–34.0)
MCHC: 31.5 g/dL (ref 30.0–36.0)
MCV: 92 fL (ref 80.0–100.0)
Monocytes Absolute: 0.6 10*3/uL (ref 0.1–1.0)
Monocytes Relative: 6 %
Neutro Abs: 8.4 10*3/uL — ABNORMAL HIGH (ref 1.7–7.7)
Neutrophils Relative %: 83 %
Platelets: 357 10*3/uL (ref 150–400)
RBC: 5.49 MIL/uL — ABNORMAL HIGH (ref 3.87–5.11)
RDW: 14.6 % (ref 11.5–15.5)
WBC: 10.1 10*3/uL (ref 4.0–10.5)
nRBC: 0 % (ref 0.0–0.2)

## 2021-09-05 LAB — LACTIC ACID, PLASMA
Lactic Acid, Venous: 2.5 mmol/L (ref 0.5–1.9)
Lactic Acid, Venous: 1.5 mmol/L (ref 0.5–1.9)

## 2021-09-05 LAB — I-STAT CHEM 8, ED
BUN: 14 mg/dL (ref 8–23)
Calcium, Ion: 1.04 mmol/L — ABNORMAL LOW (ref 1.15–1.40)
Chloride: 105 mmol/L (ref 98–111)
Creatinine, Ser: 0.6 mg/dL (ref 0.44–1.00)
Glucose, Bld: 156 mg/dL — ABNORMAL HIGH (ref 70–99)
HCT: 49 % — ABNORMAL HIGH (ref 36.0–46.0)
Hemoglobin: 16.7 g/dL — ABNORMAL HIGH (ref 12.0–15.0)
Potassium: 3.9 mmol/L (ref 3.5–5.1)
Sodium: 139 mmol/L (ref 135–145)
TCO2: 25 mmol/L (ref 22–32)

## 2021-09-05 LAB — COMPREHENSIVE METABOLIC PANEL
ALT: 12 U/L (ref 0–44)
AST: 22 U/L (ref 15–41)
Albumin: 4.3 g/dL (ref 3.5–5.0)
Alkaline Phosphatase: 48 U/L (ref 38–126)
Anion gap: 14 (ref 5–15)
BUN: 10 mg/dL (ref 8–23)
CO2: 27 mmol/L (ref 22–32)
Calcium: 10.4 mg/dL — ABNORMAL HIGH (ref 8.9–10.3)
Chloride: 98 mmol/L (ref 98–111)
Creatinine, Ser: 0.84 mg/dL (ref 0.44–1.00)
GFR, Estimated: >60 mL/min (ref >60)
Glucose, Bld: 171 mg/dL — ABNORMAL HIGH (ref 70–99)
Potassium: 3.7 mmol/L (ref 3.5–5.1)
Sodium: 139 mmol/L (ref 135–145)
Total Bilirubin: 1.2 mg/dL (ref 0.3–1.2)
Total Protein: 7.4 g/dL (ref 6.5–8.1)

## 2021-09-05 LAB — MRSA NEXT GEN BY PCR, NASAL: MRSA by PCR Next Gen: NOT DETECTED

## 2021-09-05 LAB — PROCALCITONIN: Procalcitonin: <0.1 ng/mL

## 2021-09-05 LAB — URINALYSIS, ROUTINE W REFLEX MICROSCOPIC
Bilirubin Urine: NEGATIVE
Glucose, UA: NEGATIVE mg/dL
Hgb urine dipstick: NEGATIVE
Ketones, ur: 20 mg/dL — AB
Leukocytes,Ua: NEGATIVE
Nitrite: NEGATIVE
Protein, ur: NEGATIVE mg/dL
Specific Gravity, Urine: 1.015 (ref 1.005–1.030)
pH: 7 (ref 5.0–8.0)

## 2021-09-05 LAB — MAGNESIUM
Magnesium: 2.1 mg/dL (ref 1.7–2.4)
Magnesium: 1.9 mg/dL (ref 1.7–2.4)

## 2021-09-05 LAB — PROTIME-INR
INR: 1.2 (ref 0.8–1.2)
Prothrombin Time: 15 seconds (ref 11.4–15.2)

## 2021-09-05 LAB — LIPASE, BLOOD: Lipase: 52 U/L — ABNORMAL HIGH (ref 11–51)

## 2021-09-05 LAB — RESP PANEL BY RT-PCR (FLU A&B, COVID) ARPGX2
Influenza A by PCR: NEGATIVE
Influenza B by PCR: NEGATIVE
SARS Coronavirus 2 by RT PCR: NEGATIVE

## 2021-09-05 LAB — APTT: aPTT: 29 seconds (ref 24–36)

## 2021-09-05 LAB — DIGOXIN LEVEL: Digoxin Level: 0.4 ng/mL — ABNORMAL LOW (ref 0.8–2.0)

## 2021-09-05 MED ORDER — FENTANYL CITRATE PF 50 MCG/ML IJ SOSY
50.0000 ug | PREFILLED_SYRINGE | Freq: Once | INTRAMUSCULAR | Status: AC
  Administered 2021-09-05: 11:00:00 50 ug via INTRAVENOUS
  Filled 2021-09-05: qty 1

## 2021-09-05 MED ORDER — LACTATED RINGERS IV SOLN: INTRAVENOUS | Status: DC

## 2021-09-05 MED ORDER — ONDANSETRON HCL 4 MG/2ML IJ SOLN: 4.0000 mg | Freq: Four times a day (QID) | INTRAMUSCULAR | Status: DC | PRN

## 2021-09-05 MED ORDER — HEPARIN (PORCINE) 25000 UT/250ML-% IV SOLN
900.0000 [IU]/h | INTRAVENOUS | Status: DC
  Administered 2021-09-05 – 2021-09-08 (×4): 800 [IU]/h via INTRAVENOUS
  Filled 2021-09-05 (×3): qty 250

## 2021-09-05 MED ORDER — PANTOPRAZOLE SODIUM 40 MG IV SOLR
40.0000 mg | Freq: Two times a day (BID) | INTRAVENOUS | Status: DC
Start: 2021-09-05 — End: 2021-09-09
  Administered 2021-09-05 – 2021-09-09 (×8): 40 mg via INTRAVENOUS
  Filled 2021-09-05 (×8): qty 10

## 2021-09-05 MED ORDER — ACETAMINOPHEN 325 MG PO TABS
650.0000 mg | ORAL_TABLET | Freq: Once | ORAL | Status: AC
  Administered 2021-09-05: 10:00:00 650 mg via ORAL
  Filled 2021-09-05: qty 2

## 2021-09-05 MED ORDER — ACETAMINOPHEN 325 MG PO TABS
650.0000 mg | ORAL_TABLET | Freq: Four times a day (QID) | ORAL | Status: DC | PRN
  Administered 2021-09-09: 650 mg via ORAL
  Filled 2021-09-05: qty 2

## 2021-09-05 MED ORDER — ONDANSETRON HCL 4 MG/2ML IJ SOLN
4.0000 mg | Freq: Once | INTRAMUSCULAR | Status: AC
  Administered 2021-09-05: 10:00:00 4 mg via INTRAVENOUS
  Filled 2021-09-05: qty 2

## 2021-09-05 MED ORDER — FENTANYL CITRATE PF 50 MCG/ML IJ SOSY
25.0000 ug | PREFILLED_SYRINGE | INTRAMUSCULAR | Status: DC | PRN
  Administered 2021-09-06 – 2021-09-09 (×8): 25 ug via INTRAVENOUS
  Filled 2021-09-05 (×9): qty 1

## 2021-09-05 MED ORDER — ACETAMINOPHEN 650 MG RE SUPP: 650.0000 mg | Freq: Four times a day (QID) | RECTAL | Status: DC | PRN

## 2021-09-05 MED ORDER — ALBUTEROL SULFATE (2.5 MG/3ML) 0.083% IN NEBU: 2.5000 mg | INHALATION_SOLUTION | Freq: Four times a day (QID) | RESPIRATORY_TRACT | Status: DC | PRN

## 2021-09-05 MED ORDER — LACTATED RINGERS IV BOLUS (SEPSIS)
1000.0000 mL | Freq: Once | INTRAVENOUS | Status: AC
  Administered 2021-09-05: 10:00:00 1000 mL via INTRAVENOUS

## 2021-09-05 MED ORDER — HEPARIN BOLUS VIA INFUSION
2000.0000 [IU] | Freq: Once | INTRAVENOUS | Status: AC
  Administered 2021-09-05: 18:00:00 2000 [IU] via INTRAVENOUS
  Filled 2021-09-05: qty 2000

## 2021-09-05 MED ORDER — POTASSIUM CHLORIDE 10 MEQ/100ML IV SOLN
10.0000 meq | INTRAVENOUS | Status: AC
  Administered 2021-09-05 (×2): 10 meq via INTRAVENOUS
  Filled 2021-09-05 (×2): qty 100

## 2021-09-05 MED ORDER — METOPROLOL TARTRATE 5 MG/5ML IV SOLN
2.5000 mg | INTRAVENOUS | Status: DC | PRN
  Administered 2021-09-09: 2.5 mg via INTRAVENOUS
  Filled 2021-09-05: qty 5

## 2021-09-05 MED ORDER — IOHEXOL 300 MG/ML  SOLN
100.0000 mL | Freq: Once | INTRAMUSCULAR | Status: AC | PRN
  Administered 2021-09-05: 14:00:00 100 mL via INTRAVENOUS

## 2021-09-05 MED ORDER — METOPROLOL TARTRATE 5 MG/5ML IV SOLN
5.0000 mg | Freq: Once | INTRAVENOUS | Status: AC
  Administered 2021-09-05: 15:00:00 5 mg via INTRAVENOUS
  Filled 2021-09-05: qty 5

## 2021-09-05 MED ORDER — METOPROLOL TARTRATE 5 MG/5ML IV SOLN
5.0000 mg | Freq: Once | INTRAVENOUS | Status: AC
  Administered 2021-09-05: 12:00:00 5 mg via INTRAVENOUS
  Filled 2021-09-05: qty 5

## 2021-09-05 MED ORDER — ENOXAPARIN SODIUM 40 MG/0.4ML IJ SOSY
40.0000 mg | PREFILLED_SYRINGE | INTRAMUSCULAR | Status: DC
  Administered 2021-09-05: 16:00:00 40 mg via SUBCUTANEOUS
  Filled 2021-09-05: qty 0.4

## 2021-09-05 MED ORDER — ONDANSETRON HCL 4 MG PO TABS: 4.0000 mg | ORAL_TABLET | Freq: Four times a day (QID) | ORAL | Status: DC | PRN

## 2021-09-05 MED ORDER — METOPROLOL TARTRATE 5 MG/5ML IV SOLN
2.5000 mg | Freq: Four times a day (QID) | INTRAVENOUS | Status: DC
  Administered 2021-09-05: 22:00:00 2.5 mg via INTRAVENOUS
  Filled 2021-09-05 (×2): qty 5

## 2021-09-05 NOTE — Assessment & Plan Note (Addendum)
BP stable.

## 2021-09-05 NOTE — Assessment & Plan Note (Addendum)
-   Noted to be in atrial fibrillation with RVR with heart rate in 150s on 3/10.   ?-Patient was placed on IV Cardizem drip, IV heparin drip ?-Cardiology was consulted, recommended adding Multaq, patient has been transitioned to oral beta-blocker and edoxaban at discharge.  Patient started on Multaq 400 mg twice daily and she will follow-up with her cardiologist. ?

## 2021-09-05 NOTE — Assessment & Plan Note (Addendum)
-   Lactic acid elevated to 2.5 likely due to dehydration from nausea vomiting  ?-Repeat lactic acid 1.5.  IV fluids discontinued, patient tolerating soft diet ? ?

## 2021-09-05 NOTE — Assessment & Plan Note (Addendum)
-  Presented with abdominal pain, nausea and vomiting, mildly elevated lipase 52.   ?-CT abdomen with possible partial SBO  ?-Appreciate general surgery recommendations, diet advanced to soft solids.  Continue good bowel regimen ? ?

## 2021-09-05 NOTE — ED Provider Notes (Signed)
[facility] EMERGENCY DEPARTMENT Provider Note   CSN: [id number] Arrival date & time: 09/05/21  [id number]     History  Chief Complaint  Patient presents with   Weakness    Tammy GILHOOLEY is a 88 y.o. female.  She has a history of A-fib on anticoagulation, chronic back pain due to compression fractures.  She said she has been vomiting since last night.  A little bit of abdominal pain.  Chronic chest pain.  No headache or shortness of breath. Generally weak  The history is provided by the patient and the EMS personnel.  Weakness Severity:  Moderate Onset quality:  Gradual Duration:  2 days Timing:  Constant Progression:  Worsening Chronicity:  New Relieved by:  Nothing Worsened by:  Activity Ineffective treatments:  Rest Associated symptoms: abdominal pain, chest pain, fever, nausea and vomiting   Associated symptoms: no aphasia, no cough, no dysuria, no headaches and no shortness of breath       Home Medications Prior to Admission medications   Medication Sig Start Date End Date Taking? Authorizing Provider  acetaminophen (TYLENOL) 500 MG tablet Take 500 mg by mouth 3 (three) times daily.    [provider]  atorvastatin (LIPITOR) 20 MG tablet Take 20 mg by mouth at bedtime. 05/21/20   [provider]  bisacodyl (DULCOLAX) 5 MG EC tablet Take 10 mg by mouth at bedtime.    [provider]  Calcium-Phosphorus-Vitamin D (CALCIUM/D3 ADULT GUMMIES PO) Take 1 tablet by mouth 4 (four) times daily. Calcium 200 mg/ Vitamin D3 500 I.U. - Vitafusion    [provider]  calcium-vitamin D (OSCAL WITH D) 500-200 MG-UNIT tablet Take 1 tablet by mouth 2 (two) times daily. 03/16/19   Ngetich, Dinah C, NP  cetirizine (ZYRTEC) 10 MG tablet Take 10 mg by mouth every morning.    [provider]  clotrimazole (LOTRIMIN) 1 % cream 1 application    [provider]  denosumab (PROLIA) 60 MG/ML SOSY injection Inject 60 mg into the skin  every 6 (six) months.    [provider]  diclofenac Sodium (VOLTAREN) 1 % GEL Apply 4 g topically 4 (four) times daily as needed (back pain).    [provider]  digoxin (LANOXIN) 0.125 MG tablet TAKE 1/2 TABLET(0.063 MG) BY MOUTH DAILY 06/20/21   Jerline Pain, MD  edoxaban (SAVAYSA) 30 MG TABS tablet TAKE 1 TABLET(30 MG) BY MOUTH DAILY 07/10/21   Jerline Pain, MD  HYDROcodone Bitartrate ER 15 MG CP12 Take 15 mg by mouth 2 (two) times daily. 07/02/20   [provider]  metoprolol succinate (TOPROL-XL) 25 MG 24 hr tablet TAKE 1 TABLET(25 MG) BY MOUTH DAILY 07/03/21   Jerline Pain, MD  oxyCODONE (ROXICODONE) 5 MG/5ML solution Take 2.5 mg by mouth 2 (two) times daily as needed for breakthrough pain.    [provider]  polyethylene glycol (MIRALAX / GLYCOLAX) 17 g packet Take 17 g by mouth at bedtime.    [provider]  pregabalin (LYRICA) 25 MG capsule Take 25 mg by mouth 3 (three) times daily.    [provider]  shark liver oil-cocoa butter (PREPARATION H) 0.25-3-85.5 % suppository Place 1 suppository rectally as needed for hemorrhoids.    [provider]      Allergies    Patient has no known allergies.    Review of Systems   Review of Systems  Constitutional:  Positive for fever.  HENT:  Negative for  sore throat.   Eyes:  Negative for visual disturbance.  Respiratory:  Negative for cough and shortness of breath.   Cardiovascular:  Positive for chest pain.  Gastrointestinal:  Positive for abdominal pain, nausea and vomiting.  Genitourinary:  Negative for dysuria.  Musculoskeletal:  Positive for back pain.  Skin:  Negative for rash.  Neurological:  Positive for weakness. Negative for headaches.   Physical Exam Updated Vital Signs BP (!) 145/96 (BP Location: Left Arm)    Pulse (!) 153    Temp 99.4 F (37.4 C) (Rectal)    Resp 19    Ht 5\' 4"  (1.626 m)    Wt 56.7 kg    SpO2 98%    BMI 21.46 kg/m  Physical Exam Vitals and  nursing note reviewed.  Constitutional:      General: She is not in acute distress.    Appearance: Normal appearance. She is well-developed.  HENT:     Head: Normocephalic and atraumatic.  Eyes:     Conjunctiva/sclera: Conjunctivae normal.  Cardiovascular:     Rate and Rhythm: Tachycardia present. Rhythm irregular.     Heart sounds: No murmur heard. Pulmonary:     Effort: Pulmonary effort is normal. No respiratory distress.     Breath sounds: Normal breath sounds.  Abdominal:     Palpations: Abdomen is soft.     Tenderness: There is no abdominal tenderness. There is no guarding or rebound.  Musculoskeletal:        General: No swelling. Normal range of motion.     Cervical back: Neck supple.     Right lower leg: No edema.     Left lower leg: No edema.  Skin:    General: Skin is warm and dry.     Capillary Refill: Capillary refill takes less than 2 seconds.  Neurological:     General: No focal deficit present.     Mental Status: She is alert.    ED Results / Procedures / Treatments   Labs (all labs ordered are listed, but only abnormal results are displayed) Labs Reviewed  COMPREHENSIVE METABOLIC PANEL - Abnormal; Notable for the following components:      Result Value   Glucose, Bld 171 (*)    Calcium 10.4 (*)    All other components within normal limits  CBC WITH DIFFERENTIAL/PLATELET - Abnormal; Notable for the following components:   RBC 5.49 (*)    Hemoglobin 15.9 (*)    HCT 50.5 (*)    Neutro Abs 8.4 (*)    All other components within normal limits  URINALYSIS, ROUTINE W REFLEX MICROSCOPIC - Abnormal; Notable for the following components:   APPearance CLOUDY (*)    Ketones, ur 20 (*)    All other components within normal limits  LIPASE, BLOOD - Abnormal; Notable for the following components:   Lipase 52 (*)    All other components within normal limits  DIGOXIN LEVEL - Abnormal; Notable for the following components:   Digoxin Level 0.4 (*)    All other  components within normal limits  LACTIC ACID, PLASMA - Abnormal; Notable for the following components:   Lactic Acid, Venous 2.5 (*)    All other components within normal limits  I-STAT CHEM 8, ED - Abnormal; Notable for the following components:   Glucose, Bld 156 (*)    Calcium, Ion 1.04 (*)    Hemoglobin 16.7 (*)    HCT 49.0 (*)    All other components within normal limits  RESP PANEL  BY RT-PCR (FLU A&B, COVID) [id number]  CULTURE, BLOOD (ROUTINE X 2)  CULTURE, BLOOD (ROUTINE X 2)  URINE CULTURE  MRSA NEXT GEN BY PCR, NASAL  LACTIC ACID, PLASMA  PROTIME-INR  APTT  MAGNESIUM  MAGNESIUM  PROCALCITONIN  BASIC METABOLIC PANEL  HEPARIN LEVEL (UNFRACTIONATED)  HEPARIN LEVEL (UNFRACTIONATED)  CBC    EKG EKG Interpretation  Date/Time:  Thursday September 05 2021 09:53:50 EST Ventricular Rate:  149 PR Interval:    QRS Duration: 134 QT Interval:  323 QTC Calculation: 509 R Axis:   24 Text Interpretation: Atrial fibrillation with rapid ventric response Left bundle branch block rate faster and with new ischemic changes from prior 3/22 Confirmed by Aletta Edouard [phone]) on 09/05/2021 9:56:23 AM  Radiology CT Abdomen Pelvis W Contrast  Addendum Date: 09/05/2021   ADDENDUM REPORT: 09/05/2021 14:23 ADDENDUM: Following should be added to the impression. There are small high density foci in the dependent portion of gallbladder suggesting gallbladder stones. There are no imaging signs of acute cholecystitis. Electronically Signed   By: Elmer Picker M.D.   On: 09/05/2021 14:23   Result Date: 09/05/2021 CLINICAL DATA:  Abdominal pain EXAM: CT ABDOMEN AND PELVIS WITH CONTRAST TECHNIQUE: Multidetector CT imaging of the abdomen and pelvis was performed using the standard protocol following bolus administration of intravenous contrast. RADIATION DOSE REDUCTION: This exam was performed according to the departmental dose-optimization program which includes automated exposure control, adjustment of  the mA and/or kV according to patient size and/or use of iterative reconstruction technique. CONTRAST:  156mL OMNIPAQUE IOHEXOL 300 MG/ML  SOLN COMPARISON:  None. FINDINGS: Lower chest: There are patchy infiltrates in the posterior lower lung fields, more so on the left side. There is large fixed hiatal hernia. Air-fluid level is seen within the hiatal hernia. Hepatobiliary: There is fatty infiltration in the liver. Gallbladder is distended without significant wall thickening. There are few small high density foci in the dependent portion of gallbladder, possibly gallbladder stones. Pancreas: No focal abnormality is seen. Spleen: Unremarkable. Adrenals/Urinary Tract: Adrenals are unremarkable. There are no renal or ureteral stones. There are few left renal cysts largest in the upper pole measuring 4.4 cm. Stomach/Bowel: There is abnormal dilation of proximal small bowel loops measuring up to 3.9 cm in diameter. Distal small bowel loops are decompressed. Exact level of transition is not clearly identified. Appendix is not distinctly seen. There is no focal pericecal inflammation. There is no significant wall thickening in the colon. Vascular/Lymphatic: There are scattered arterial calcifications. Reproductive: Unremarkable. Other: There is no ascites or pneumoperitoneum. Musculoskeletal: There is marked decrease in height of body T12 vertebra. There is retropulsion of upper endplate causing extrinsic pressure over the ventral margin of thecal sac. There is mild to moderate compression of body of L2 vertebra. IMPRESSION: There is abnormal dilation of proximal small bowel loops with decompression of distal small bowel loops suggesting partial small bowel obstruction. Exact level of transition is not clearly identified. Small-bowel obstruction may be caused by internal hernia or adhesions. There is no hydronephrosis. There is no ascites or pneumoperitoneum. Large fixed hiatal hernia. Severe compression fracture in the  body T12 vertebra and mild to moderate compression of body of L2 vertebra. These may be recent or old. Please correlate with clinical history and physical examination findings. Small patchy infiltrates are seen in the lower lung fields, more so on the left side suggesting atelectasis/pneumonia. Other findings as described in the body of the report. Electronically Signed: By: Elmer Picker M.D. On: 09/05/2021  13:50   DG Chest Port 1 View  Result Date: 09/05/2021 CLINICAL DATA:  Questionable sepsis.  Evaluate for abnormality. EXAM: PORTABLE CHEST 1 VIEW COMPARISON:  AP chest 09/24/2020 FINDINGS: Cardiac silhouette is again moderately enlarged. Mediastinal contours are grossly within normal limits with moderate calcification within the aortic arch. Mildly decreased lung volumes although improved from prior. Left-greater-than-right linear basilar opacities. Additional likely mild left basilar heterogeneous opacity. No definite pleural effusion. No pneumothorax. Moderate multilevel degenerative disc changes of the thoracic spine. IMPRESSION:: IMPRESSION: 1. Stable moderate cardiomegaly. 2. Low lung volumes with left-greater-than-right basilar opacities. This may represent atelectasis, although left basilar pneumonia is possible. Electronically Signed   By: Yvonne Kendall M.D.   On: 09/05/2021 11:08    Procedures .Critical Care Performed by: Hayden Rasmussen, MD Authorized by: Hayden Rasmussen, MD   Critical care provider statement:    Critical care time (minutes):  45   Critical care time was exclusive of:  Separately billable procedures and treating other patients   Critical care was necessary to treat or prevent imminent or life-threatening deterioration of the following conditions:  Cardiac failure   Critical care was time spent personally by me on the following activities:  Development of treatment plan with patient or surrogate, discussions with consultants, evaluation of patient's response to  treatment, examination of patient, obtaining history from patient or surrogate, ordering and performing treatments and interventions, ordering and review of laboratory studies, ordering and review of radiographic studies, pulse oximetry, re-evaluation of patient's condition and review of old charts   I assumed direction of critical care for this patient from another provider in my specialty: no      Medications Ordered in ED Medications  lactated ringers infusion ( Intravenous Infusion Verify 09/05/21 1800)  acetaminophen (TYLENOL) tablet 650 mg (has no administration in time range)    Or  acetaminophen (TYLENOL) suppository 650 mg (has no administration in time range)  ondansetron (ZOFRAN) tablet 4 mg (has no administration in time range)    Or  ondansetron (ZOFRAN) injection 4 mg (has no administration in time range)  albuterol (PROVENTIL) (2.5 MG/3ML) 0.083% nebulizer solution 2.5 mg (has no administration in time range)  metoprolol tartrate (LOPRESSOR) injection 2.5 mg (has no administration in time range)  fentaNYL (SUBLIMAZE) injection 25 mcg (has no administration in time range)  pantoprazole (PROTONIX) injection 40 mg (has no administration in time range)  metoprolol tartrate (LOPRESSOR) injection 2.5 mg (has no administration in time range)  heparin ADULT infusion 100 units/mL (25000 units/289mL) (800 Units/hr Intravenous New Bag/Given 09/05/21 1759)  lactated ringers bolus 1,000 mL (0 mLs Intravenous Stopped 09/05/21 1144)  acetaminophen (TYLENOL) tablet 650 mg (650 mg Oral Given 09/05/21 1003)  ondansetron (ZOFRAN) injection 4 mg (4 mg Intravenous Given 09/05/21 1004)  fentaNYL (SUBLIMAZE) injection 50 mcg (50 mcg Intravenous Given 09/05/21 1055)  metoprolol tartrate (LOPRESSOR) injection 5 mg (5 mg Intravenous Given 09/05/21 1148)  iohexol (OMNIPAQUE) 300 MG/ML solution 100 mL (100 mLs Intravenous Contrast Given 09/05/21 1337)  metoprolol tartrate (LOPRESSOR) injection 5 mg (5 mg Intravenous  Given 09/05/21 1503)  potassium chloride 10 mEq in 100 mL IVPB ( Intravenous Infusion Verify 09/05/21 1800)  heparin bolus via infusion 2,000 Units (2,000 Units Intravenous Bolus from Bag 09/05/21 1759)    ED Course/ Medical Decision Making/ A&P Clinical Course as of 09/05/21 2031  Thu Sep 05, 2021  1054 Daughter is here now and can update me on some chronic medical conditions.  She has chronic neck  and back pain and is on opiates for that.  She does have episodes of vomiting especially with stress.  She vomited probably 5 times last night.  She was also feeling just very weak in general.  She is hoping that after hydration we can get her to return home. [MB]  [id number] Chest x-ray interpreted by me as possible left lower lobe infiltrate.  Awaiting radiology reading. [MB]  [id number] Discussed with Dr. Tamala Julian [facility] hospital[facility] who will evaluate the patient for admission. [MB]  85 Was able to reach general surgery and they will put a consult on the patient. [MB]    Clinical Course User Index [MB] Hayden Rasmussen, MD                           Medical Decision Making Amount and/or Complexity of Data Reviewed Labs: ordered. Radiology: ordered. ECG/medicine tests: ordered.  Risk OTC drugs. Prescription drug management. Decision regarding hospitalization.  ASTARIA AUTREY was evaluated in Emergency Department on 09/05/2021 for the symptoms described in the history of present illness. She was evaluated in the context of the global COVID-19 pandemic, which necessitated consideration that the patient might be at risk for infection with the SARS-CoV-2 virus that causes COVID-19. Institutional protocols and algorithms that pertain to the evaluation of patients at risk for COVID-19 are in a state of rapid change based on information released by regulatory bodies including the CDC and federal and state organizations. These policies and algorithms were followed during the patient's care in the ED.  This patient  complains of vomiting and weakness; this involves an extensive number of treatment Options and is a complaint that carries with it a high risk of complications and morbidity. The differential includes obstruction, perforation, diverticulitis, colitis, constipation, hernia, metabolic derangement  I ordered, reviewed and interpreted labs, which included CBC with normal white count, hemoglobin slightly below baseline concern for dehydration, chemistries fairly unremarkable, lactate elevated but cleared, urinalysis without signs of infection, COVID and flu negative I ordered medication IV fluids, IV beta-blocker and reviewed PMP when indicated. I ordered imaging studies which included chest x-ray and CT abdomen and pelvis and I independently    visualized and interpreted imaging which showed PSBO no definite transition point Additional history obtained from patient's daughter and EMS Previous records obtained and reviewed in epic no recent admissions I consulted general surgery and [facility] hospitalist Dr. Tamala Julian and discussed lab and imaging findings and discussed disposition.  Cardiac monitoring reviewed, atrial fibrillation with rapid ventricular response Social determinants considered, no significant barriers Critical Interventions: Initiation of IV beta-blockers for patient's tachycardia  After the interventions stated above, I reevaluated the patient and found patient to be symptomatically improved Admission and further testing considered, patient will need admission to the hospital for further rate control and following of her PSBO.  Hopefully she can avoid surgery.  Daughter in agreement with plan for admission.          Final Clinical Impression(s) / ED Diagnoses Final diagnoses:  Atrial fibrillation with rapid ventricular response ([place])  Partial small bowel obstruction [facility])    Rx / DC Orders ED Discharge Orders     None         Hayden Rasmussen, MD 09/05/21 2035

## 2021-09-05 NOTE — Consult Note (Signed)
Consult Note  Tammy Walker 11-May-1931  [id number].    Requesting MD: Meridee Score, MD Chief Complaint/Reason for Consult: SBO HPI:  Patient is an 88 year old female who presented to [facility] with complaint of generalized weakness abdominal pain, nausea and vomiting.   Patient reports she awoke in the middle of the night with right sided abdominal pain that was mild to moderate, constant and associated with nausea and 5 episodes of emesis. Last episode of emesis was 11am. Since receiving pain and nausea medication in the ED her abdominal pain and nausea has resolved. No flatus since onset of symptoms. Last BM yesterday morning. Denies diarrhea or constipation leading to admission. She usually has 1-2 bm's daily and takes a stool softener. She has a hx of open appendectomy in 1947 and tubal ligation in 1978.   In the ED she was afebrile. Intermittently tachycardic w/ a. Fib, lactic 2.5 > 1.5 after IVF, CR wnl, LFT's wnl. CT A/P w/ proximal small bowel dilation w/ decompression of distal small bowel loops without clear transition. There is also a large fixed hiatal hernia.   PMH otherwise significant for A. Fib on Edoxaban (last dose yesterday am), HTN, HLD, GERD, osteoporosis. NKDA.   ROS: Review of Systems  Respiratory:  Positive for shortness of breath.   Cardiovascular:  Positive for chest pain.  Gastrointestinal:  Positive for abdominal pain, nausea and vomiting. Negative for diarrhea.   Family History  Problem Relation Age of Onset   Heart disease Mother    High blood pressure Mother    COPD Mother    Heart failure Mother    Heart disease Father    Heart disease Brother    Heart failure Brother    Hyperlipidemia Daughter    Hypertension Daughter     Past Medical History:  Diagnosis Date   Allergy    Arrhythmia    Atrial fibrillation (HCC)    GERD (gastroesophageal reflux disease)    History of bone scan    Bone Density Scans 2017 & 2019 Dr. Dutch Quint    History of  chest x-ray    Apart from large hiatus hernia, normal heart, lungs and mediastinum. Per records from [facility]     History of CT scan of abdomen 10/24/2017   Gallstones within a thin-waled gallbladder, Per records from [facility]    History of echocardiogram    [facility] &  [facility]    History of EKG    Sinus rhythm, borderline 1st deg block, LAD completely changed axis, LBBB(old). Per records from [facility]    Hyperlipidemia    Hypertension    Insomnia    Left lower lobe pneumonia 09/20/2015   Per records from [facility]    Nodule of right lung    Per [facility],    Osteoporosis    on fosamax   Osteoporosis    Reduced mobility     Past Surgical History:  Procedure Laterality Date   APPENDECTOMY  [address] [place]    CT SCAN     2017, 2018, 2019 -- re lungs see records    TUBAL LIGATION  1978   [place], [place], [place]     Social History:  reports that she has never smoked. She has never used smokeless tobacco. She reports current alcohol use. She reports that she does not use drugs.  Allergies: No Known Allergies  (Not in  a hospital admission)   Blood pressure (!) 127/97, pulse 84, temperature 99.4 F (37.4 C), temperature source Rectal, resp. rate (!) 23, height 5\' 4"  (1.626 m), weight 56.7 kg, SpO2 95 %. Physical Exam:  General: pleasant, elderly, frail appearing female who is laying in bed in NAD HEENT: head is normocephalic, atraumatic.  Sclera are noninjected.  PERRL.  Ears and nose without any masses or lesions.  Mouth is pink and moist Heart: Irregular Irregular without obvious murmur.  Palpable radial and pedal pulses bilaterally Lungs: CTAB, no wheezes, rhonchi, or rales noted.  Respiratory effort nonlabored Abd: Her abdomen is very soft with mild distension with mild tenderness of the lower abdomen. No peritonitis. +BS. No masses,  hernias, or organomegaly. Prior open appendectomy scar well healed.  MS: all 4 extremities are symmetrical with no cyanosis, clubbing, or edema. Skin: warm and dry with no masses, lesions, or rashes Neuro: Cranial nerves 2-12 grossly intact, sensation is normal throughout, gait not assessed.  Psych: A&Ox3 with an appropriate affect.   Results for orders placed or performed during the hospital encounter of 09/05/21 (from the past 48 hour(s))  Resp Panel by RT-PCR (Flu A&B, Covid) Nasopharyngeal Swab     Status: None   Collection Time: 09/05/21  9:51 AM   Specimen: Nasopharyngeal Swab; Nasopharyngeal(NP) swabs in vial transport medium  Result Value Ref Range   SARS Coronavirus 2 by RT PCR NEGATIVE NEGATIVE    Comment: (NOTE) SARS-CoV-2 target nucleic acids are NOT DETECTED.  The SARS-CoV-2 RNA is generally detectable in upper respiratory specimens during the acute phase of infection. The lowest concentration of SARS-CoV-2 viral copies this assay can detect is 138 copies/mL. A negative result does not preclude SARS-Cov-2 infection and should not be used as the sole basis for treatment or other patient management decisions. A negative result may occur with  improper specimen collection/handling, submission of specimen other than nasopharyngeal swab, presence of viral mutation(s) within the areas targeted by this assay, and inadequate number of viral copies(<138 copies/mL). A negative result must be combined with clinical observations, patient history, and epidemiological information. The expected result is Negative.  Fact Sheet for Patients:  BloggerCourse.comhttps://www.fda.gov/media/152166/download  Fact Sheet for Healthcare Providers:  SeriousBroker.ithttps://www.fda.gov/media/152162/download  This test is no t yet approved or cleared by the [place] FDA and  has been authorized for detection and/or diagnosis of SARS-CoV-2 by FDA under an Emergency Use Authorization (EUA). This EUA will remain  in effect  (meaning this test can be used) for the duration of the COVID-19 declaration under Section 564(b)(1) of the Act, 21 U.S.C.section 360bbb-3(b)(1), unless the authorization is terminated  or revoked sooner.       Influenza A by PCR NEGATIVE NEGATIVE   Influenza B by PCR NEGATIVE NEGATIVE    Comment: (NOTE) The Xpert Xpress SARS-CoV-2/FLU/RSV plus assay is intended as an aid in the diagnosis of influenza from Nasopharyngeal swab specimens and should not be used as a sole basis for treatment. Nasal washings and aspirates are unacceptable for Xpert Xpress SARS-CoV-2/FLU/RSV testing.  Fact Sheet for Patients: BloggerCourse.comhttps://www.fda.gov/media/152166/download  Fact Sheet for Healthcare Providers: SeriousBroker.ithttps://www.fda.gov/media/152162/download  This test is not yet approved or cleared by the [place] FDA and has been authorized for detection and/or diagnosis of SARS-CoV-2 by FDA under an Emergency Use Authorization (EUA). This EUA will remain in effect (meaning this test can be used) for the duration of the COVID-19 declaration under Section 564(b)(1) of the Act, 21 U.S.C. section 360bbb-3(b)(1), unless the authorization is terminated or  revoked.  Performed at [facility], [address] [place], [place] [address]   Comprehensive metabolic panel     Status: Abnormal   Collection Time: 09/05/21  9:56 AM  Result Value Ref Range   Sodium 139 135 - 145 mmol/L   Potassium 3.7 3.5 - 5.1 mmol/L   Chloride 98 98 - 111 mmol/L   CO2 27 22 - 32 mmol/L   Glucose, Bld 171 (H) 70 - 99 mg/dL    Comment: Glucose reference range applies only to samples taken after fasting for at least 8 hours.   BUN 10 8 - 23 mg/dL   Creatinine, Ser 7.06 0.44 - 1.00 mg/dL   Calcium 23.7 (H) 8.9 - 10.3 mg/dL   Total Protein 7.4 6.5 - 8.1 g/dL   Albumin 4.3 3.5 - 5.0 g/dL   AST 22 15 - 41 U/L   ALT 12 0 - 44 U/L   Alkaline Phosphatase 48 38 - 126 U/L   Total Bilirubin 1.2 0.3 - 1.2 mg/dL   GFR, Estimated >62  >83 mL/min    Comment: (NOTE) Calculated using the CKD-EPI Creatinine Equation (2021)    Anion gap 14 5 - 15    Comment: Performed at [facility], [address], [place], [place] [address]  CBC WITH DIFFERENTIAL     Status: Abnormal   Collection Time: 09/05/21  9:56 AM  Result Value Ref Range   WBC 10.1 4.0 - 10.5 K/uL   RBC 5.49 (H) 3.87 - 5.11 MIL/uL   Hemoglobin 15.9 (H) 12.0 - 15.0 g/dL   HCT 16.0 (H) 73.7 - 10.6 %   MCV 92.0 80.0 - 100.0 fL   MCH 29.0 26.0 - 34.0 pg   MCHC 31.5 30.0 - 36.0 g/dL   RDW 26.9 48.5 - 46.2 %   Platelets 357 150 - 400 K/uL   nRBC 0.0 0.0 - 0.2 %   Neutrophils Relative % 83 %   Neutro Abs 8.4 (H) 1.7 - 7.7 K/uL   Lymphocytes Relative 10 %   Lymphs Abs 1.0 0.7 - 4.0 K/uL   Monocytes Relative 6 %   Monocytes Absolute 0.6 0.1 - 1.0 K/uL   Eosinophils Relative 0 %   Eosinophils Absolute 0.0 0.0 - 0.5 K/uL   Basophils Relative 0 %   Basophils Absolute 0.0 0.0 - 0.1 K/uL   Immature Granulocytes 1 %   Abs Immature Granulocytes 0.05 0.00 - 0.07 K/uL    Comment: Performed at [facility], [address], [place], [place] [address]  Protime-INR     Status: None   Collection Time: 09/05/21  9:56 AM  Result Value Ref Range   Prothrombin Time 15.0 11.4 - 15.2 seconds   INR 1.2 0.8 - 1.2    Comment: (NOTE) INR goal varies based on device and disease states. Performed at [facility], [address], [place], [place] [address]   APTT     Status: None   Collection Time: 09/05/21  9:56 AM  Result Value Ref Range   aPTT 29 24 - 36 seconds    Comment: Performed at [facility], [address], [place], [place] [address]  Lipase, blood     Status: Abnormal   Collection Time: 09/05/21  9:56 AM  Result Value Ref Range   Lipase 52 (H) 11 - 51 U/L    Comment: Performed at [facility], [address], [place], [place] [address]  Magnesium  Status: None   Collection Time: 09/05/21  9:56 AM  Result Value Ref Range    Magnesium 2.1 1.7 - 2.4 mg/dL    Comment: Performed at [facility], [address], [place], [place] [address]  Digoxin level     Status: Abnormal   Collection Time: 09/05/21  9:56 AM  Result Value Ref Range   Digoxin Level 0.4 (L) 0.8 - 2.0 ng/mL    Comment: Performed at [facility], [address], [place], [place] [address]  Lactic acid, plasma     Status: Abnormal   Collection Time: 09/05/21  9:56 AM  Result Value Ref Range   Lactic Acid, Venous 2.5 (HH) 0.5 - 1.9 mmol/L    Comment: CRITICAL RESULT CALLED TO, READ BACK BY AND VERIFIED WITH: TORI GLOSSON,RN AT 1214 09/05/2021 BY ZBEECH. Performed at [facility], [address] [place], [place] [address]   I-Stat Chem 8, ED     Status: Abnormal   Collection Time: 09/05/21 10:37 AM  Result Value Ref Range   Sodium 139 135 - 145 mmol/L   Potassium 3.9 3.5 - 5.1 mmol/L   Chloride 105 98 - 111 mmol/L   BUN 14 8 - 23 mg/dL   Creatinine, Ser 9.14 0.44 - 1.00 mg/dL   Glucose, Bld 782 (H) 70 - 99 mg/dL    Comment: Glucose reference range applies only to samples taken after fasting for at least 8 hours.   Calcium, Ion 1.04 (L) 1.15 - 1.40 mmol/L   TCO2 25 22 - 32 mmol/L   Hemoglobin 16.7 (H) 12.0 - 15.0 g/dL   HCT 95.6 (H) 21.3 - 08.6 %  Urinalysis, Routine w reflex microscopic     Status: Abnormal   Collection Time: 09/05/21 11:29 AM  Result Value Ref Range   Color, Urine YELLOW YELLOW   APPearance CLOUDY (A) CLEAR   Specific Gravity, Urine 1.015 1.005 - 1.030   pH 7.0 5.0 - 8.0   Glucose, UA NEGATIVE NEGATIVE mg/dL   Hgb urine dipstick NEGATIVE NEGATIVE   Bilirubin Urine NEGATIVE NEGATIVE   Ketones, ur 20 (A) NEGATIVE mg/dL   Protein, ur NEGATIVE NEGATIVE mg/dL   Nitrite NEGATIVE NEGATIVE   Leukocytes,Ua NEGATIVE NEGATIVE    Comment: Performed at [facility], [address] [place], [place] [address]  Lactic acid, plasma     Status: None   Collection Time: 09/05/21 12:52 PM  Result Value Ref  Range   Lactic Acid, Venous 1.5 0.5 - 1.9 mmol/L    Comment: Performed at [facility], [address] [place], [place] [address]   CT Abdomen Pelvis W Contrast  Addendum Date: 09/05/2021   ADDENDUM REPORT: 09/05/2021 14:23 ADDENDUM: Following should be added to the impression. There are small high density foci in the dependent portion of gallbladder suggesting gallbladder stones. There are no imaging signs of acute cholecystitis. Electronically Signed   By: Ernie Avena M.D.   On: 09/05/2021 14:23   Result Date: 09/05/2021 CLINICAL DATA:  Abdominal pain EXAM: CT ABDOMEN AND PELVIS WITH CONTRAST TECHNIQUE: Multidetector CT imaging of the abdomen and pelvis was performed using the standard protocol following bolus administration of intravenous contrast. RADIATION DOSE REDUCTION: This exam was performed according to the departmental dose-optimization program which includes automated exposure control, adjustment of the mA and/or kV according to patient size and/or use of iterative reconstruction technique. CONTRAST:  OMNIPAQUE IOHEXOL 300 MG/ML  SOLN COMPARISON:  None. FINDINGS: Lower  chest: There are patchy infiltrates in the posterior lower lung fields, more so on the left side. There is large fixed hiatal hernia. Air-fluid level is seen within the hiatal hernia. Hepatobiliary: There is fatty infiltration in the liver. Gallbladder is distended without significant wall thickening. There are few small high density foci in the dependent portion of gallbladder, possibly gallbladder stones. Pancreas: No focal abnormality is seen. Spleen: Unremarkable. Adrenals/Urinary Tract: Adrenals are unremarkable. There are no renal or ureteral stones. There are few left renal cysts largest in the upper pole measuring 4.4 cm. Stomach/Bowel: There is abnormal dilation of proximal small bowel loops measuring up to 3.9 cm in diameter. Distal small bowel loops are decompressed. Exact level of transition is not  clearly identified. Appendix is not distinctly seen. There is no focal pericecal inflammation. There is no significant wall thickening in the colon. Vascular/Lymphatic: There are scattered arterial calcifications. Reproductive: Unremarkable. Other: There is no ascites or pneumoperitoneum. Musculoskeletal: There is marked decrease in height of body T12 vertebra. There is retropulsion of upper endplate causing extrinsic pressure over the ventral margin of thecal sac. There is mild to moderate compression of body of L2 vertebra. IMPRESSION: There is abnormal dilation of proximal small bowel loops with decompression of distal small bowel loops suggesting partial small bowel obstruction. Exact level of transition is not clearly identified. Small-bowel obstruction may be caused by internal hernia or adhesions. There is no hydronephrosis. There is no ascites or pneumoperitoneum. Large fixed hiatal hernia. Severe compression fracture in the body T12 vertebra and mild to moderate compression of body of L2 vertebra. These may be recent or old. Please correlate with clinical history and physical examination findings. Small patchy infiltrates are seen in the lower lung fields, more so on the left side suggesting atelectasis/pneumonia. Other findings as described in the body of the report. Electronically Signed: By: Ernie Avena M.D. On: 09/05/2021 13:50   DG Chest Port 1 View  Result Date: 09/05/2021 CLINICAL DATA:  Questionable sepsis.  Evaluate for abnormality. EXAM: PORTABLE CHEST 1 VIEW COMPARISON:  AP chest 09/24/2020 FINDINGS: Cardiac silhouette is again moderately enlarged. Mediastinal contours are grossly within normal limits with moderate calcification within the aortic arch. Mildly decreased lung volumes although improved from prior. Left-greater-than-right linear basilar opacities. Additional likely mild left basilar heterogeneous opacity. No definite pleural effusion. No pneumothorax. Moderate multilevel  degenerative disc changes of the thoracic spine. IMPRESSION:: IMPRESSION: 1. Stable moderate cardiomegaly. 2. Low lung volumes with left-greater-than-right basilar opacities. This may represent atelectasis, although left basilar pneumonia is possible. Electronically Signed   By: Neita Garnet M.D.   On: 09/05/2021 11:08    Anti-infectives (From admission, onward)    None        Assessment/Plan SBO - CT with dilation of proximal small bowel loops and decompressed distal small bowel without clear transition. Also with a large fixed hiatal hernia. She has a hx of open appendectomy and tubal ligation. Her wbc is wnl and lactic has cleared. Her abdomen is mildly distended with some lower abdominal tenderness but is very soft and without peritonitis. No current indication for emergency surgery. Will hold off on NGT decompression for now given her n/v has resolved and and keep NPO. If she develops worsening pain, distension, or n/v recurs she will need NGT decompression. If she requires NGT decompression overnight, would recommend SBO protocol. For now, will write for AM xray and hold off on starting protocol. Keep K > 4 and Mg > 2 + mobilize for  bowel function. Hopefully the  patient will improve with conservative management. If she fails to improve with conservative management, she may require exploratory surgery during admission. Agree with medical admission. We will follow with you.   FEN: NPO, IVF VTE: SCDs, okay for chemical prophylaxis or heparin gtt from our standpoint. Please hold Edoxaban. ID: None currently indicated from our standpoint  T12 and L2 chronic compression fractures  A fib on Edoxaban (last dose yesterday am) now with RVR HTN HLD GERD Osteoporosis   I reviewed vitals, labs, pending TRH and EDP notes, RN notes, and CT scan since presentation.   Jacinto Halim, [facility] Surgery 09/05/2021, 4:00 PM Please see Amion for pager number during day hours  7:00am-4:30pm

## 2021-09-05 NOTE — H&P (Addendum)
History and Physical    Patient: Tammy Walker [id number] DOB: May 28, 1931 DOA: 09/05/2021 DOS: the patient was seen and examined on 09/05/2021 PCP: Lois Huxley, PA  Patient coming from: Home via EMS  Chief Complaint:  Chief Complaint  Patient presents with   Abdominal pain   HPI: Tammy Walker is a 88 y.o. female with medical history significant of hypertension, hyperlipidemia, atrial fibrillation on Edoxaban, and GERD presents with complaints of midline abdominal pain which woke her up out of her sleep.  Patient reports that she had nausea and vomiting.  Reports emesis was brown in color, but notes that it looked like the food that she had eaten last.  Notes associated symptoms of shortness of breath.  She reports taking a daily stool softener to make sure that she stays regular.  Her last bowel movement was yesterday morning, and she has not passed flatus today that she can recall.  She last vomited around 10 -11 AM this morning.  Her prior abdominal surgeries include appendectomy at the age of 13 and a tubal ligation.  Patient reports that she has never had this happen previously before in the past.  She last took Edoxaban yesterday morning.  Plan admission into the emergency department patient was noted to have a temperature of 99.4 F, pulse elevated to the 150s  in atrial fibrillation lab, blood pressures maintained, and O2 sat initially noted to be as low as 87% with improvement 2 L of nasal cannula oxygen.  Labs significant for hemoglobin 15.9, calcium 10.4, glucose 171, lipase 52, and lactic acid 2.5.  Urinalysis did not note any signs of infection.  Influenza and COVID-19 screening were negative.  Blood and urine cultures were obtained.  CT scan of the abdomen pelvis significant for dilated proximal small bowel loops suggesting partial small bowel obstruction with question of internal hernia or adhesions as cause, large hiatal hernia, compression fracture T12, L1 along with L1, and  small patchy infiltrates of the lower lung fields suggesting atelectasis versus pneumonia.  Patient was given 1 L of lactated Ringer's, fentanyl, and IV metoprolol for rate control.  Review of Systems: As mentioned in the history of present illness. All other systems reviewed and are negative. Past Medical History:  Diagnosis Date   Allergy    Arrhythmia    Atrial fibrillation ([place])    GERD (gastroesophageal reflux disease)    History of bone scan    Bone Density Scans 2017 & 2019 Dr. Trenton Gammon    History of chest x-ray    Apart from large hiatus hernia, normal heart, lungs and mediastinum. Per records from [facility]     History of CT scan of abdomen 10/24/2017   Gallstones within a thin-waled gallbladder, Per records [facility]    [facility] of echocardiogram    [facility]    History of EKG    Sinus rhythm, borderline 1st deg block, LAD completely changed axis, LBBB(old). Per [facility] from [facility]    Hyperlipidemia    Hypertension    Insomnia    Left lower lobe pneumonia 09/20/2015   Per [facility]    Nodule of right lung    Per records from [facility],    Osteoporosis    on fosamax   Osteoporosis    Reduced mobility    Past Surgical History:  Procedure Laterality Date   [place], [place]  CT SCAN     2017, 2018, 2019 -- re lungs see records    [place], [place], [place]    Social History:  reports that she has never smoked. She has never used smokeless tobacco. She reports current alcohol use. She reports that she does not use drugs.  No Known Allergies  Family History  Problem Relation Age of Onset   Heart disease Mother    High blood pressure Mother    COPD Mother    Heart failure Mother    Heart disease Father    Heart disease Brother    Heart failure Brother    Hyperlipidemia Daughter     Hypertension Daughter     Prior to Admission medications   Medication Sig Start Date End Date Taking? Authorizing Provider  acetaminophen (TYLENOL) 500 MG tablet Take 500 mg by mouth 3 (three) times daily.   Yes [provider]  atorvastatin (LIPITOR) 20 MG tablet Take 20 mg by mouth at bedtime. 05/21/20  Yes [provider]  bisacodyl (DULCOLAX) 5 MG EC tablet Take 10 mg by mouth at bedtime.   Yes [provider]  Calcium-Phosphorus-Vitamin D (CALCIUM/D3 ADULT GUMMIES PO) Take 1 tablet by mouth 4 (four) times daily. Calcium 200 mg/ Vitamin D3 500 I.U. - Vitafusion   Yes [provider]  calcium-vitamin D (OSCAL WITH D) 500-200 MG-UNIT tablet Take 1 tablet by mouth 2 (two) times daily. 03/16/19  Yes Ngetich, Dinah C, NP  cetirizine (ZYRTEC) 10 MG tablet Take 10 mg by mouth every morning.   Yes [provider]  clotrimazole (LOTRIMIN) 1 % cream Apply 1 application. topically 2 (two) times daily as needed (fungus).   Yes [provider]  denosumab (PROLIA) 60 MG/ML SOSY injection Inject 60 mg into the skin every 6 (six) months.   Yes [provider]  diclofenac Sodium (VOLTAREN) 1 % GEL Apply 4 g topically 4 (four) times daily as needed (back pain).   Yes [provider]  digoxin (LANOXIN) 0.125 MG tablet TAKE 1/2 TABLET(0.063 MG) BY MOUTH DAILY Patient taking differently: Take 0.0625 mg by mouth daily. 06/20/21  Yes Jerline Pain, MD  edoxaban (SAVAYSA) 30 MG TABS tablet TAKE 1 TABLET(30 MG) BY MOUTH DAILY Patient taking differently: Take 30 mg by mouth daily. 07/10/21  Yes Jerline Pain, MD  HYDROcodone Bitartrate ER 15 MG CP12 Take 15 mg by mouth 2 (two) times daily. 07/02/20  Yes [provider]  metoprolol succinate (TOPROL-XL) 25 MG 24 hr tablet TAKE 1 TABLET(25 MG) BY MOUTH DAILY Patient taking differently: Take 25 mg by mouth daily. 07/03/21  Yes Jerline Pain, MD  Multiple Vitamins-Minerals (PRESERVISION AREDS 2  PO) Take 1 capsule by mouth 2 (two) times daily.   Yes [provider]  oxyCODONE (ROXICODONE) 5 MG/5ML solution Take 2.5 mg by mouth 2 (two) times daily as needed for breakthrough pain.   Yes [provider]  polyethylene glycol (MIRALAX / GLYCOLAX) 17 g packet Take 17 g by mouth at bedtime. Hold for loose stool   Yes [provider]  pregabalin (LYRICA) 25 MG capsule Take 25 mg by mouth 3 (three) times daily.   Yes [provider]  shark liver oil-cocoa butter (PREPARATION H) 0.25-3-85.5 % suppository Place 1 suppository rectally as needed for hemorrhoids.   Yes [provider]    Physical Exam: Vitals:   09/05/21 1300 09/05/21 1430 09/05/21 1500 09/05/21 1502  BP: (!) 121/97  123/89 (!) 127/97   Pulse: (!) 140 (!) 119 (!) 117 84  Resp: (!) 25 (!) 21 (!) 22 (!) 23  Temp:      TempSrc:      SpO2: 95% 90% (!) 87% 95%  Weight:      Height:       Exam  Constitutional: Elderly female who appears acutely ill Eyes: PERRL, lids and conjunctivae normal ENMT: Mucous membranes are dry posterior pharynx clear of any exudate or lesions.  Neck: normal, supple, no JVD Respiratory: Mildly tachypneic with decreased aeration but no significant wheezes or rhonchi appreciated. Cardiovascular: Irregular irregular and tachycardic.  No significant lower extremity edema. Abdomen: Midline tenderness to palpation.  Bowel sounds decreased. Musculoskeletal: no clubbing / cyanosis. No joint deformity upper and lower extremities. Good ROM, no contractures. Normal muscle tone.  Skin: no rashes, lesions, ulcers.  Neurologic: CN 2-12 grossly intact.  Able to move all extremities Psychiatric: Normal judgment and insight. Alert and oriented x 3. Normal mood.   Data Reviewed:  EKG noted atrial fibrillation 145 bpm with RVR  Assessment and Plan: * Partial small bowel obstruction ([place]) Patient presents with complaints of abdominal pain with reports of nausea and  vomiting.  Labs noted mildly elevated lipase of 52.  CT angiogram noted concern for possible partial small bowel obstruction without clear transition point.  -Admit to a progressive bed -N.p.o. -Antiemetics as needed -Consider need of NG tube if patient has persistent vomiting -Continue lactated Ringer's IV fluids -Fentanyl IV as needed for pain -Recheck abdominal x-ray in a.m. -Appreciate general surgery consultative services, we will follow-up for any further recommendation  Paroxysmal atrial fibrillation with RVR ([place]) on chronic anticoagulation Acute.  On admission patient noted to be in atrial fibrillation with RVR with heart rates into the 150s.  Home medication regimen includes edoxaban 30 mg daily, digoxin 0.125 mg, and metoprolol 25 mg daily, but patient unable to tolerate p.o. at this time. -Hold edoxaban -Heparin drip per pharmacy -Goal potassium at least 4 and magnesium at least 2 -Schedule metoprolol 2.5 mg IV every 6 hours and as needed for elevated heart rate >130 as long as blood pressures maintain  Hypoxia Acute.  O2 saturation noted to drop as low as 87% for which patient was placed on 2 L nasal cannula oxygen.  Imaging studies noted low lung volumes with concern for either atelectasis versus pneumonia.  Patient was noted to have a low-grade temperature of 99.4 F.  Question as symptoms secondary to pain medications and/or pneumonia.  Given patient's episodes of vomiting aspiration is a possibility -Continuous pulse oximetry with nasal cannula oxygen maintain O2 saturation greater than 92% -Incentive spirometry -Add-on procalcitonin -Consider need to treat for possible aspiration pneumonia.  Lactic acidosis Acute.  Lactic acid elevated 2.5.  Suspect symptoms secondary to patient being dehydrated from vomiting. -Recheck lactic acid levels  Essential hypertension Blood pressures currently maintained.  Home medications include metoprolol succinate 25 mg daily. -Transition  to IV metoprolol as noted above  Hypercalcemia Acute.  Calcium mildly elevated 10.4. -IV fluids as seen above  Chronic low back pain Patient noted to have T12 and L2 compression fractures on imaging studies.  Home medication regimen includes hydrocodone ER 15 mg twice daily and oxycodone 2.5 mg 2 times daily as needed. -Substituted IV fentanyl for pain as patient is n.p.o.   Hiatal hernia -Protonix IV  Advance Care Planning:   Code Status: Full Code   Consults: General surgery  Family Communication: Left voicemail  on daughter phone  Severity of Illness: The appropriate patient status for this patient is INPATIENT. Inpatient status is judged to be reasonable and necessary in order to provide the required intensity of service to ensure the patient's safety. The patient's presenting symptoms, physical exam findings, and initial radiographic and laboratory data in the context of their chronic comorbidities is felt to place them at high risk for further clinical deterioration. Furthermore, it is not anticipated that the patient will be medically stable for discharge from the hospital within 2 midnights of admission.   * I certify that at the point of admission it is my clinical judgment that the patient will require inpatient hospital care spanning beyond 2 midnights from the point of admission due to high intensity of service, high risk for further deterioration and high frequency of surveillance required.*  Author: Norval Morton, MD 09/05/2021 3:14 PM  For on call review [email or web address].

## 2021-09-05 NOTE — Assessment & Plan Note (Addendum)
-   In ED, O2 sats dropped to 87% and patient was placed on 2 L O2 via [place].   ?--CT abdomen showed patchy infiltrates in the posterior lower lung fields more so on the left  Noted to have low-grade temp, possible aspiration. ?-Patient was placed on IV Unasyn, transition to oral Augmentin at discharge ?-Home O2 evaluation done, recommended 2 L on exertion ?

## 2021-09-05 NOTE — ED Notes (Signed)
RN aware of BP 

## 2021-09-05 NOTE — Progress Notes (Addendum)
ANTICOAGULATION CONSULT NOTE - Initial Consult ? ?Pharmacy Consult for heparin ?Indication: atrial fibrillation ? ?No Known Allergies ? ?Patient Measurements: ?Height: 5\' 4"  (162.6 cm) ?Weight: 56.7 kg (125 lb) ?IBW/kg (Calculated) : 54.7 ?Heparin Dosing Weight: 56.7 kg ? ?Vital Signs: ?Temp: 99.4 ?F (37.4 ?C) (03/09 10-07-1980) ?Temp Source: Rectal (03/09 0946) ?BP: 139/122 (03/09 1600) ?Pulse Rate: 113 (03/09 1600) ? ?Labs: ?Recent Labs  ?  09/05/21 ?0956 09/05/21 ?1037  ?HGB 15.9* 16.7*  ?HCT 50.5* 49.0*  ?PLT 357  --   ?APTT 29  --   ?LABPROT 15.0  --   ?INR 1.2  --   ?CREATININE 0.84 0.60  ? ? ?Estimated Creatinine Clearance: 42 mL/min (by C-G formula based on SCr of 0.6 mg/dL). ? ? ?Medical History: ?Past Medical History:  ?Diagnosis Date  ? Allergy   ? Arrhythmia   ? Atrial fibrillation (H[facility])   ? GERD (gastroesophageal reflux disease)   ? History of bone scan   ? Bone Density Scans 2017 & 2019 Dr. 2020   ? History of chest x-ray   ? Apart from large hiatus hernia, normal heart, lungs and mediastinum. [facility]    ? History of CT scan of abdomen 10/24/2017  ? Gallstones within a thin-waled gallbladder, Per records from [facility]   ? History of echocardiogram   ? [facility]   ? History of EKG   ? Sinus rhythm, borderline 1st deg block, LAD completely changed axis, LBBB(old). Per records from [facility]   ? Hyperlipidemia   ? Hypertension   ? Insomnia   ? Left lower lobe pneumonia 09/20/2015  ? Per [facility] from [facility]   ? Nodule of right lung   ? Per records from [place],   ? Osteoporosis   ? on fosamax  ? Osteoporosis   ? Reduced mobility   ? ? ?Medications:  ?Medications Prior to Admission  ?Medication Sig Dispense Refill Last Dose  ? acetaminophen (TYLENOL) 500 MG tablet Take 500 mg by mouth 3 (three) times daily.   09/04/2021  ? atorvastatin (LIPITOR) 20 MG tablet Take 20 mg by  mouth at bedtime.   09/04/2021  ? bisacodyl (DULCOLAX) 5 MG EC tablet Take 10 mg by mouth at bedtime.   09/04/2021  ? Calcium-Phosphorus-Vitamin D (CALCIUM/D3 ADULT GUMMIES PO) Take 1 tablet by mouth 4 (four) times daily. Calcium 200 mg/ Vitamin D3 500 I.U. - Vitafusion   09/04/2021  ? calcium-vitamin D (OSCAL WITH D) 500-200 MG-UNIT tablet Take 1 tablet by mouth 2 (two) times daily. 60 tablet 3 09/04/2021  ? cetirizine (ZYRTEC) 10 MG tablet Take 10 mg by mouth every morning.   09/04/2021  ? clotrimazole (LOTRIMIN) 1 % cream Apply 1 application. topically 2 (two) times daily as needed (fungus).   Past Month  ? denosumab (PROLIA) 60 MG/ML SOSY injection Inject 60 mg into the skin every 6 (six) months.   unk  ? diclofenac Sodium (VOLTAREN) 1 % GEL Apply 4 g topically 4 (four) times daily as needed (back pain).   Past Month  ? digoxin (LANOXIN) 0.125 MG tablet TAKE 1/2 TABLET(0.063 MG) BY MOUTH DAILY (Patient taking differently: Take 0.0625 mg by mouth daily.) 45 tablet 2 09/05/2021  ? edoxaban (SAVAYSA) 30 MG TABS tablet TAKE 1 TABLET(30 MG) BY MOUTH DAILY (Patient taking differently: Take 30 mg by mouth daily.) 90 tablet 1 09/04/2021 at 2000  ? HYDROcodone Bitartrate ER 15  MG CP12 Take 15 mg by mouth 2 (two) times daily.   09/04/2021  ? metoprolol succinate (TOPROL-XL) 25 MG 24 hr tablet TAKE 1 TABLET(25 MG) BY MOUTH DAILY (Patient taking differently: Take 25 mg by mouth daily.) 90 tablet 2 09/05/2021 at 0830  ? Multiple Vitamins-Minerals (PRESERVISION AREDS 2 PO) Take 1 capsule by mouth 2 (two) times daily.   09/04/2021  ? oxyCODONE (ROXICODONE) 5 MG/5ML solution Take 2.5 mg by mouth 2 (two) times daily as needed for breakthrough pain.   09/04/2021  ? polyethylene glycol (MIRALAX / GLYCOLAX) 17 g packet Take 17 g by mouth at bedtime. Hold for loose stool   09/04/2021  ? pregabalin (LYRICA) 25 MG capsule Take 25 mg by mouth 3 (three) times daily.   09/04/2021  ? shark liver oil-cocoa butter (PREPARATION H) 0.25-3-85.5 % suppository Place 1  suppository rectally as needed for hemorrhoids.   Past Month  ? ?Scheduled:  ? heparin  2,000 Units Intravenous Once  ? metoprolol tartrate  2.5 mg Intravenous Q6H  ? pantoprazole (PROTONIX) IV  40 mg Intravenous Q12H  ? ?Infusions:  ? heparin    ? lactated ringers 100 mL/hr at 09/05/21 1507  ? potassium chloride 10 mEq (09/05/21 1625)  ? ? ?Assessment: ?Patient presented with generalized  weakness. Patient was tachycardic with Sbps in 90s in atrial fibrillation in the ED. Patient was on edoxaban prior to admission.  ? ?Goal of Therapy:  ?Heparin level 0.3-0.7 units/ml ?Monitor platelets by anticoagulation protocol: Yes ?  ?Plan:  ?Bolus with 2000 units ?Start heparin at 800 units/hour ?8 hour heparin level ?Daily heparin level and CBC ? ?Thank you for allowing pharmacy to participate in this patient's care. ? ?Enos Fling, PharmD ?PGY1 Pharmacy Resident ?09/05/2021 4:58 PM ?Check [email or web address] for unit specific pharmacy number ? ? ? ?

## 2021-09-05 NOTE — Plan of Care (Signed)
Discussed with patient plan of care for the evening, pain management and admission question with some teach back displayed.  Daughter is coming back to stay with her overnight.   ? ? ?Problem: Education: ?Goal: Knowledge of General Education information will improve ?Description: Including pain rating scale, medication(s)/side effects and non-pharmacologic comfort measures ?Outcome: Progressing ?  ?

## 2021-09-05 NOTE — Assessment & Plan Note (Addendum)
-  Acute.  Calcium mildly elevated 10.4, likely due to dehydration ?-Improved, DC IV fluids ?

## 2021-09-05 NOTE — Assessment & Plan Note (Addendum)
-   Noted to have severe compression fracture of the body of T12 vertebra and mild to moderate compression of the body of L2, may be recent or old.   ?-Continue home regimen when able to take p.o. ( hydrocodone ER 15 mg twice daily and oxycodone 2.5 mg 2 times daily as needed). ? ?

## 2021-09-05 NOTE — ED Triage Notes (Signed)
Via EMS from home. Reports weakness x 1 day. BP 90s systolic at scene. Hx of Afib, GCS 15, denies fall, nausea, emesis. Pt reports taking all meds this AM ?

## 2021-09-06 ENCOUNTER — Inpatient Hospital Stay (HOSPITAL_COMMUNITY): Payer: Medicare Other

## 2021-09-06 DIAGNOSIS — M5440 Lumbago with sciatica, unspecified side: Secondary | ICD-10-CM | POA: Diagnosis not present

## 2021-09-06 DIAGNOSIS — K56600 Partial intestinal obstruction, unspecified as to cause: Secondary | ICD-10-CM | POA: Diagnosis not present

## 2021-09-06 DIAGNOSIS — Z7901 Long term (current) use of anticoagulants: Secondary | ICD-10-CM | POA: Diagnosis not present

## 2021-09-06 DIAGNOSIS — I4891 Unspecified atrial fibrillation: Secondary | ICD-10-CM

## 2021-09-06 DIAGNOSIS — K449 Diaphragmatic hernia without obstruction or gangrene: Secondary | ICD-10-CM

## 2021-09-06 LAB — URINE CULTURE: Culture: NO GROWTH

## 2021-09-06 LAB — APTT
aPTT: 70 seconds — ABNORMAL HIGH (ref 24–36)
aPTT: 86 seconds — ABNORMAL HIGH (ref 24–36)

## 2021-09-06 LAB — CBC
HCT: 39.2 % (ref 36.0–46.0)
Hemoglobin: 12.4 g/dL (ref 12.0–15.0)
MCH: 29.1 pg (ref 26.0–34.0)
MCHC: 31.6 g/dL (ref 30.0–36.0)
MCV: 92 fL (ref 80.0–100.0)
Platelets: 267 10*3/uL (ref 150–400)
RBC: 4.26 MIL/uL (ref 3.87–5.11)
RDW: 15 % (ref 11.5–15.5)
WBC: 6.2 10*3/uL (ref 4.0–10.5)
nRBC: 0 % (ref 0.0–0.2)

## 2021-09-06 LAB — BASIC METABOLIC PANEL
Anion gap: 8 (ref 5–15)
BUN: 12 mg/dL (ref 8–23)
CO2: 25 mmol/L (ref 22–32)
Calcium: 8.6 mg/dL — ABNORMAL LOW (ref 8.9–10.3)
Chloride: 107 mmol/L (ref 98–111)
Creatinine, Ser: 0.65 mg/dL (ref 0.44–1.00)
GFR, Estimated: >60 mL/min (ref >60)
Glucose, Bld: 118 mg/dL — ABNORMAL HIGH (ref 70–99)
Potassium: 4.1 mmol/L (ref 3.5–5.1)
Sodium: 140 mmol/L (ref 135–145)

## 2021-09-06 LAB — GLUCOSE, CAPILLARY: Glucose-Capillary: 110 mg/dL — ABNORMAL HIGH (ref 70–99)

## 2021-09-06 LAB — HEPARIN LEVEL (UNFRACTIONATED): Heparin Unfractionated: 0.8 IU/mL — ABNORMAL HIGH (ref 0.30–0.70)

## 2021-09-06 MED ORDER — METOPROLOL TARTRATE 5 MG/5ML IV SOLN
2.5000 mg | Freq: Four times a day (QID) | INTRAVENOUS | Status: DC
  Administered 2021-09-06 (×3): 2.5 mg via INTRAVENOUS
  Filled 2021-09-06 (×3): qty 5

## 2021-09-06 MED ORDER — SODIUM CHLORIDE 0.9 % IV SOLN: INTRAVENOUS | Status: DC

## 2021-09-06 MED ORDER — PIPERACILLIN-TAZOBACTAM 3.375 G IVPB
3.3750 g | Freq: Three times a day (TID) | INTRAVENOUS | Status: DC
  Filled 2021-09-06: qty 50

## 2021-09-06 MED ORDER — DIGOXIN 0.25 MG/ML IJ SOLN
0.1250 mg | Freq: Once | INTRAMUSCULAR | Status: AC
  Administered 2021-09-06: 0.125 mg via INTRAVENOUS
  Filled 2021-09-06: qty 0.5

## 2021-09-06 MED ORDER — DILTIAZEM HCL-DEXTROSE 125-5 MG/125ML-% IV SOLN (PREMIX)
5.0000 mg/h | INTRAVENOUS | Status: DC
  Administered 2021-09-06: 5 mg/h via INTRAVENOUS
  Administered 2021-09-07 – 2021-09-08 (×2): 7.5 mg/h via INTRAVENOUS
  Administered 2021-09-08: 10 mg/h via INTRAVENOUS
  Filled 2021-09-06 (×5): qty 125

## 2021-09-06 MED ORDER — FLEET ENEMA 7-19 GM/118ML RE ENEM
1.0000 | ENEMA | Freq: Once | RECTAL | Status: AC
  Administered 2021-09-06: 1 via RECTAL
  Filled 2021-09-06: qty 1

## 2021-09-06 MED ORDER — SODIUM CHLORIDE 0.9 % IV SOLN
3.0000 g | Freq: Three times a day (TID) | INTRAVENOUS | Status: DC
  Administered 2021-09-06 – 2021-09-09 (×9): 3 g via INTRAVENOUS
  Filled 2021-09-06 (×10): qty 8

## 2021-09-06 MED ORDER — METOPROLOL TARTRATE 5 MG/5ML IV SOLN: 2.5000 mg | INTRAVENOUS | Status: DC | PRN

## 2021-09-06 MED ORDER — DILTIAZEM HCL 25 MG/5ML IV SOLN
10.0000 mg | Freq: Once | INTRAVENOUS | Status: AC
  Administered 2021-09-06: 10 mg via INTRAVENOUS
  Filled 2021-09-06: qty 5

## 2021-09-06 NOTE — Assessment & Plan Note (Addendum)
-   CT showed large fixed hiatal hernia.  ?-Continue Protonix ?

## 2021-09-06 NOTE — Plan of Care (Signed)

## 2021-09-06 NOTE — TOC Initial Note (Signed)
Transition of Care (TOC) - Initial/Assessment Note  ? ? ?Patient Details  ?Name: Tammy Walker ?MRN: [id number] ?Date of Birth: May 18, 1931 ? ?Transition of Care (TOC) CM/SW Contact:    ?Beckie Busing, RN ?Phone [phone] ? ?09/06/2021, 3:22 PM ? ?Clinical Narrative:                 ? ?Transition of Care (TOC) Screening Note ? ? ?Patient Details  ?Name: Tammy Walker ?Date of Birth: [birth year] ? ? ?Transition of Care (TOC) CM/SW Contact:    ?Beckie Busing, RN ?Phone Number: ?09/06/2021, 3:23 PM ? ? ? ?Transition of Care Department [facility]) has reviewed patient and no TOC needs have been identified at this time. We will continue to monitor patient advancement through interdisciplinary progression rounds. If new patient transition needs arise, please place a TOC consult. ? ? ? ?  ?  ? ? ?Patient Goals and CMS Choice ?  ?  ?  ? ?Expected Discharge Plan and Services ?  ?  ?  ?  ?  ?                ?  ?  ?  ?  ?  ?  ?  ?  ?  ?  ? ?Prior Living Arrangements/Services ?  ?  ?  ?       ?  ?  ?  ?  ? ?Activities of Daily Living ?Home Assistive Devices/Equipment: Oxygen, Hearing aid, Eyeglasses ?ADL Screening (condition at time of admission) ?Patient's cognitive ability adequate to safely complete daily activities?: Yes ?Is the patient deaf or have difficulty hearing?: Yes ?Does the patient have difficulty seeing, even when wearing glasses/contacts?: Yes ?Does the patient have difficulty concentrating, remembering, or making decisions?: No ?Patient able to express need for assistance with ADLs?: Yes ?Does the patient have difficulty dressing or bathing?: No ?Independently performs ADLs?: Yes (appropriate for developmental age) ?Does the patient have difficulty walking or climbing stairs?: No ?Weakness of Legs: None ?Weakness of Arms/Hands: None ? ?Permission Sought/Granted ?  ?  ?   ?   ?   ?   ? ?Emotional Assessment ?  ?  ?  ?  ?  ?  ? ?Admission diagnosis:  SBO (small bowel obstruction) (HCC) [K56.609] ?Patient Active  Problem List  ? Diagnosis Date Noted  ? Hiatal hernia 09/06/2021  ? Partial small bowel obstruction (HCC) 09/05/2021  ? Lactic acidosis 09/05/2021  ? Hypercalcemia 09/05/2021  ? Acute respiratory failure with hypoxia 09/05/2021  ? Essential hypertension 09/05/2021  ? Mild aortic stenosis 04/02/2021  ? Left bundle branch block 04/02/2021  ? Paroxysmal atrial fibrillation with RVR (HCC) on chronic anticoagulation 09/23/2020  ? Aortic atherosclerosis (HCC) 07/25/2020  ? Chronic low back pain 07/29/2019  ? Chronic anticoagulation 06/25/2012  ? ?PCP:  Wilfrid Lund, PA ?Pharmacy:   ?[facility] [phone] - [place], [place] - [address] ?[address] ?[place] [address] ?Phone: [phone] Fax: [phone] ? ?[facility] ?[address] ?[place] [address] ?Phone: [phone] Fax: [phone] ? ? ? ? ?Social Determinants of Health (SDOH) Interventions ?  ? ?Readmission Risk Interventions ?No flowsheet data found. ? ? ?

## 2021-09-06 NOTE — Progress Notes (Signed)
? ? ? [facility] ?                                                                            ? ? ?Tammy Walker, is a 88 y.o. female, DOB - 04-Apr-1931, RE:[id number] ?Admit date - 09/05/2021    ?Outpatient Primary MD for the patient is Lois Huxley, [place] ? ?LOS - 1  days ? ? ? ?Brief summary  ? ?Patient is a 88 year old female with history of hypertension, hyperlipidemia, A-fib on enoxaparin, GERD presented with abdominal pain which woke her up out of her sleep.  Also reported nausea and vomiting, no hematemesis.  Also associated shortness of breath.  In ED, noted to have temp of 99.4 ?F, heart rate in 150s, atrial fibrillation with RVR.  O2 sats initially 87% on room air. ?Lipase 52, lactic acid 2.5. ?CT abdomen showed possible SBO, large hiatal hernia, compression fracture T12, L1 and small patchy infiltrates of the lower lung fields. ?General surgery was consulted, patient was admitted for further work-up. ? ? ?Assessment & Plan  ? ? ?Assessment and Plan: ?* Partial small bowel obstruction ([place]) ?-Presented with abdominal pain, nausea and vomiting, mildly elevated lipase 52.   ?-CT abdomen with possible partial SBO  ?-Patient was placed on n.p.o. status, IV fluids, antiemetics.  General surgery consulted  ?-Repeat abdominal x-ray shows distended small bowel loops in the LLQ ileus or obstruction ?-No active vomiting, hence hold off on NG.  Will follow surgery recommendations, ambulate, increase mobility ? ?Acute respiratory failure with hypoxia ?- In ED, O2 sats dropped to 87% and patient was placed on 2 L O2 via [place].  -CT abdomen showed patchy infiltrates in the posterior lower lung fields more so on the left  Noted to have low-grade temp, possible aspiration. ?-Still has scattered rhonchi with cough, placed on IV Unasyn ? ?Paroxysmal atrial fibrillation with RVR (HCC) on chronic anticoagulation ?- Noted to be in atrial fibrillation with RVR on admission with heart rate in 150s.   ?-Continue IV heparin  drip per pharmacy.   ?-Continue scheduled Lopressor.  Currently rate controlled.   ?-Hold edoxaban. ? ?Essential hypertension ?- BP stable, continue beta-blocker  ? ?Hiatal hernia ?- CT showed large fixed hiatal hernia.  ?-Will place on PPI if symptoms of esophagitis/gastritis ? ?Hypercalcemia ?-Acute.  Calcium mildly elevated 10.4, likely due to dehydration ?-Continue IV fluid hydration ? ?Lactic acidosis ?- Lactic acid elevated to 2.5 likely due to dehydration from nausea vomiting  ?-Continue IV fluid hydration.  Repeat lactic acid 1.5  ? ?Chronic low back pain ?- Noted to have severe compression fracture of the body of T12 vertebra and mild to moderate compression of the body of L2, may be recent or old.   ?-Continue home regimen when able to take p.o. ( hydrocodone ER 15 mg twice daily and oxycodone 2.5 mg 2 times daily as needed). ?-Continue IV fentanyl as needed ? ?Chronic anticoagulation ?- Continue IV heparin drip per pharmacy ? ? ?Code Status: Full code ?DVT Prophylaxis:    Heparin drip ? ? ?Level of Care: Level of care: Progressive ?Family Communication: Updated patient's daughter at the bedside ? ?Disposition Plan:     Remains inpatient appropriate:  SBO ? ?Procedures:  ?CT abdomen ? ?Consultants:   ?General surgery ? ?Antimicrobials:  ?IV Unasyn 3/10--> ? ?Medications ? ? metoprolol tartrate  2.5 mg Intravenous Q6H  ? pantoprazole (PROTONIX) IV  40 mg Intravenous Q12H  ? sodium phosphate  1 enema Rectal Once  ? ? ? ?Subjective:  ? ?Abeeha Bollin was seen and examined today.  Skin no flatus or BM.  No active nausea or vomiting.  Having some cough but afebrile.  Patient denies dizziness, chest pain, shortness of breath. No acute events overnight.   ? ?Objective:  ? ?Vitals:  ? 09/06/21 0200 09/06/21 0300 09/06/21 0400 09/06/21 0805  ?BP: 137/71 (!) 141/68 (!) 141/64 (!) 141/103  ?Pulse: 65 71 75 69  ?Resp: 20 20 19 18   ?Temp:  98.2 ?F (36.8 ?C) 98.2 ?F (36.8 ?C) 97.7 ?F (36.5 ?C)  ?TempSrc:  Oral Oral Oral   ?SpO2: 97% 97% 96% 97%  ?Weight:      ?Height:      ? ? ?Intake/Output Summary (Last 24 hours) at 09/06/2021 1234 ?Last data filed at 09/06/2021 0305 ?Gross per 24 hour  ?Intake 1239.11 ml  ?Output 500 ml  ?Net 739.11 ml  ? ?Filed Weights  ? 09/05/21 0946  ?Weight: 56.7 kg  ? ? ? ?Exam ?General: Alert and oriented x 3, NAD ?Cardiovascular: S1 S2 auscultated, no murmurs, RRR ?Respiratory: Scattered rhonchi bilaterally lower lung fields ?Gastrointestinal: Soft, mild lower abdominal distention, NT + bowel sounds ?Ext: no pedal edema bilaterally ?Neuro: no new deficits ?Psych: Normal affect and demeanor, alert and oriented x3  ? ? ?Data Reviewed:  I have personally reviewed following labs  ? ? ?CBC ?Lab Results  ?Component Value Date  ? WBC 6.2 09/06/2021  ? RBC 4.26 09/06/2021  ? HGB 12.4 09/06/2021  ? HCT 39.2 09/06/2021  ? MCV 92.0 09/06/2021  ? MCH 29.1 09/06/2021  ? PLT 267 09/06/2021  ? MCHC 31.6 09/06/2021  ? RDW 15.0 09/06/2021  ? LYMPHSABS 1.0 09/05/2021  ? MONOABS 0.6 09/05/2021  ? EOSABS 0.0 09/05/2021  ? BASOSABS 0.0 09/05/2021  ? ? ? ?Last metabolic panel ?Lab Results  ?Component Value Date  ? NA 140 09/06/2021  ? K 4.1 09/06/2021  ? CL 107 09/06/2021  ? CO2 25 09/06/2021  ? BUN 12 09/06/2021  ? CREATININE 0.65 09/06/2021  ? GLUCOSE 118 (H) 09/06/2021  ? GFRNONAA >60 09/06/2021  ? GFRAA 72 08/15/2019  ? CALCIUM 8.6 (L) 09/06/2021  ? PROT 7.4 09/05/2021  ? ALBUMIN 4.3 09/05/2021  ? BILITOT 1.2 09/05/2021  ? ALKPHOS 48 09/05/2021  ? AST 22 09/05/2021  ? ALT 12 09/05/2021  ? ANIONGAP 8 09/06/2021  ? ? ?CBG (last 3)  ?Recent Labs  ?  09/06/21 ?0158  ?GLUCAP 110*  ?  ? ? ?Coagulation Profile: ?Recent Labs  ?Lab 09/05/21 ?0956  ?INR 1.2  ? ? ? ?Radiology Studies: I have personally reviewed the imaging studies  ?CT Abdomen Pelvis W Contrast ? ?Addendum Date: 09/05/2021   ?ADDENDUM REPORT: 09/05/2021 14:23 ADDENDUM: Following should be added to the impression. There are small high density foci in the dependent portion  of gallbladder suggesting gallbladder stones. There are no imaging signs of acute cholecystitis. Electronically Signed   By: Elmer Picker M.D.   On: 09/05/2021 14:23  ? ?Result Date: 09/05/2021 ? IMPRESSION: There is abnormal dilation of proximal small bowel loops with decompression of distal small bowel loops suggesting partial small bowel obstruction. Exact level of transition is not clearly  identified. Small-bowel obstruction may be caused by internal hernia or adhesions. There is no hydronephrosis. There is no ascites or pneumoperitoneum. Large fixed hiatal hernia. Severe compression fracture in the body T12 vertebra and mild to moderate compression of body of L2 vertebra. These may be recent or old. Please correlate with clinical history and physical examination findings. Small patchy infiltrates are seen in the lower lung fields, more so on the left side suggesting atelectasis/pneumonia. Other findings as described in the body of the report. Electronically Signed: By: Elmer Picker M.D. On: 09/05/2021 13:50  ? ?DG Chest Port 1 View ? ?Result Date: 09/05/2021 ?IMPRESSION:: IMPRESSION: 1. Stable moderate cardiomegaly. 2. Low lung volumes with left-greater-than-right basilar opacities. This may represent atelectasis, although left basilar pneumonia is possible. Electronically Signed   By: Yvonne Kendall M.D.   On: 09/05/2021 11:08  ? ?DG Abd Portable 1V ? ?Result Date: 09/06/2021 ?IMPRESSION: Distended small bowel loops in the left lower abdominal quadrant, which may represent ileus or obstruction. Clinical correlation is suggested. Electronically Signed   By: Keane Police D.O.   On: 09/06/2021 08:01   ? ? ? ? ?Estill Cotta M.D. ?[facility] ?09/06/2021, 12:34 PM ? ?Available via Epic secure chat 7am-7pm ?After 7 pm, please refer to night coverage provider listed on amion. ? ?  ?

## 2021-09-06 NOTE — Progress Notes (Addendum)
ANTICOAGULATION CONSULT NOTE - Initial Consult ? ?Pharmacy Consult for heparin ?Indication: atrial fibrillation ? ?No Known Allergies ? ?Patient Measurements: ?Height: 5\' 4"  (162.6 cm) ?Weight: 56.7 kg (125 lb) ?IBW/kg (Calculated) : 54.7 ?Heparin Dosing Weight: 56.7 kg ? ?Vital Signs: ?Temp: 98 ?F (36.7 ?C) (03/10 1300) ?Temp Source: Oral (03/10 1300) ?BP: 166/63 (03/10 1300) ?Pulse Rate: 73 (03/10 1300) ? ?Labs: ?Recent Labs  ?  09/05/21 ?0956 09/05/21 ?1037 09/06/21 ?0118 09/06/21 ?1303  ?HGB 15.9* 16.7* 12.4  --   ?HCT 50.5* 49.0* 39.2  --   ?PLT 357  --  267  --   ?APTT 29  --  70* 86*  ?LABPROT 15.0  --   --   --   ?INR 1.2  --   --   --   ?HEPARINUNFRC  --   --  0.80*  --   ?CREATININE 0.84 0.60 0.65  --   ? ? ?Estimated Creatinine Clearance: 42 mL/min (by C-G formula based on SCr of 0.65 mg/dL). ? ? ?Medical History: ?Past Medical History:  ?Diagnosis Date  ? Allergy   ? Arrhythmia   ? Atrial fibrillation ([place])   ? GERD (gastroesophageal reflux disease)   ? History of bone scan   ? Bone Density Scans 2017 & 2019 Dr. Trenton Gammon   ? History of chest x-ray   ? Apart from large hiatus hernia, normal heart, lungs and mediastinum. Per [facility] from [facility]    ? History of CT scan of abdomen 10/24/2017  ? Gallstones within a thin-waled gallbladder, Per [facility]   ? History of echocardiogram   ? [facility] &  [facility]   ? History of EKG   ? Sinus rhythm, borderline 1st deg block, LAD completely changed axis, LBBB(old). Per records from [facility]   ? Hyperlipidemia   ? Hypertension   ? Insomnia   ? Left lower lobe pneumonia 09/20/2015  ? Per [facility] from [facility]   ? Nodule of right lung   ? Per [facility] from [facility],   ? Osteoporosis   ? on fosamax  ? Osteoporosis   ? Reduced mobility   ?  ? ?Assessment: ?88 yo W on edoxaban PTA for afib now with partial SBO and holding edoxaban. Last dose was 3/8@  20:00  Pharmacy consulted for heparin.   ? ?Aptt is 70 and therapeutic at low end of goal; heparin level 0.8 is supratherapeutic on 800 units/hr. APTT and HL are not correlating, will follow aPTT since HL is affected by edoxaban.  No issues with infusion but has some minor bleeding around IV site per RN. Will not increase rate given age, weight and bleeding.  ? ?Goal of Therapy:  ?Heparin level 0.3-0.7 units/ml ?APTT 66 - 102 seconds ?Monitor platelets by anticoagulation protocol: Yes ?  ?Plan:   ?Continue heparin 800 units/hour  ?F/u 6 hour confirmatory APTT ?F/u aPTT until correlates with heparin level  ?Monitor daily aPTT, heparin level, CBC ?Monitor for signs/symptoms of bleeding  ? ?ADDENDUM 14:00:  Confirmatory apTT up to 86 seconds, is therapeutic. Per RN, was able to stop previous oozing around IV, will keep monitoring. Continue heparin 800 units/hr. ? ?Thank you for allowing pharmacy to participate in this patient's care. ? ?Benetta Spar, PharmD, BCPS, BCCP ?Clinical Pharmacist ? ?Please check AMION for all [place] phone numbers ?After 10:00 PM, call [place] [phone] ? ?

## 2021-09-06 NOTE — Progress Notes (Signed)
Pharmacy Antibiotic Note ? ?Tammy Walker is a 88 y.o. female admitted on 09/05/2021 with aspiration pneumonia.  Pharmacy has been consulted for piperacillin/tazobactam dosing. Discussed with MD who agreed with narrowing to ampicillin/sulbactam.  ? ?ClCr 42 ml/min.  ? ?Plan: ?ampicillin/sulbactam 3g q8 hr ?Monitor cultures, clinical status, renal function  ?Narrow abx as able and f/u duration  ? ?Height: 5\' 4"  (162.6 cm) ?Weight: 56.7 kg (125 lb) ?IBW/kg (Calculated) : 54.7 ? ?Temp (24hrs), Avg:98.3 ?F (36.8 ?C), Min:97.7 ?F (36.5 ?C), Max:99.4 ?F (37.4 ?C) ? ?Recent Labs  ?Lab 09/05/21 ?0956 09/05/21 ?1037 09/05/21 ?1252 09/06/21 ?0118  ?WBC 10.1  --   --  6.2  ?CREATININE 0.84 0.60  --  0.65  ?LATICACIDVEN 2.5*  --  1.5  --   ?  ?Estimated Creatinine Clearance: 42 mL/min (by C-G formula based on SCr of 0.65 mg/dL).   ? ?No Known Allergies ? ?Antimicrobials this admission: ?Amp/sub 3/10>  ? ?Microbiology results: ?3/9 Bcx: pending  ?3/9 MRSA PCR: neg ? ?Thank you for allowing pharmacy to be a part of this patientTammys care. ? ?5/9, PharmD, BCPS, BCCP ?Clinical Pharmacist ? ?Please check AMION for all [facility] phone numbers ?After 10:00 PM, call Main Pharmacy [phone] ? ? ?

## 2021-09-06 NOTE — Hospital Course (Signed)
Patient is a 88 year old female with history of hypertension, hyperlipidemia, A-fib on enoxaparin, GERD presented with abdominal pain which woke her up out of her sleep.  Also reported nausea and vomiting, no hematemesis.  Also associated shortness of breath.  In ED, noted to have temp of 99.4 ?F, heart rate in 150s, atrial fibrillation with RVR.  O2 sats initially 87% on room air. ?Lipase 52, lactic acid 2.5. ?CT abdomen showed possible SBO, large hiatal hernia, compression fracture T12, L1 and small patchy infiltrates of the lower lung fields. ?General surgery was consulted, patient was admitted for further work-up. ?

## 2021-09-06 NOTE — Assessment & Plan Note (Addendum)
-   Initially placed on IV heparin drip, has been transitioned to an edoxaban at discharge. ?

## 2021-09-06 NOTE — Progress Notes (Addendum)
Subjective: CC: Doing well. No abdominal pain, n/v since I saw her in the ED yesterday. Did get some IV pain medication this morning. She said this was for back pain. No flatus or BM yet.  She is unsure if her abdomen is bloated/distended from baseline but does not think so. Mobilizing in the room but not the halls. Patients daughter/POA is at bedside.  Objective: Vital signs in last 24 hours: Temp:  [98 F (36.7 C)-99.4 F (37.4 C)] 98.2 F (36.8 C) (03/10 0400) Pulse Rate:  [65-153] 75 (03/10 0400) Resp:  [15-25] 19 (03/10 0400) BP: (118-150)/(64-126) 141/64 (03/10 0400) SpO2:  [87 %-100 %] 96 % (03/10 0400) FiO2 (%):  [0 %] 0 % (03/09 1532) Weight:  [56.7 kg] 56.7 kg (03/09 0946) Last BM Date :  (PTA)  Intake/Output from previous day: 03/09 0701 - 03/10 0700 In: 2238.1 [P.O.:90; I.V.:1039.1; IV Piggyback:1109.1] Out: 500 [Urine:500] Intake/Output this shift: No intake/output data recorded.  PE: Gen:  Alert, NAD, pleasant Card:  Reg rate  Pulm:  Rate and effort normal. On o2 Abd: Mild lower abdominal distension but very soft. NT on exam. No peritonitis. +BS. Prior open appendectomy scar well healed.  Psych: A&Ox3  Skin: no rashes noted, warm and dry  Lab Results:  Recent Labs    09/05/21 0956 09/05/21 1037 09/06/21 0118  WBC 10.1  --  6.2  HGB 15.9* 16.7* 12.4  HCT 50.5* 49.0* 39.2  PLT 357  --  267   BMET Recent Labs    09/05/21 0956 09/05/21 1037 09/06/21 0118  NA 139 139 140  K 3.7 3.9 4.1  CL 98 105 107  CO2 27  --  25  GLUCOSE 171* 156* 118*  BUN 10 14 12   CREATININE 0.84 0.60 0.65  CALCIUM 10.4*  --  8.6*   PT/INR Recent Labs    09/05/21 0956  LABPROT 15.0  INR 1.2   CMP     Component Value Date/Time   NA 140 09/06/2021 0118   K 4.1 09/06/2021 0118   CL 107 09/06/2021 0118   CO2 25 09/06/2021 0118   GLUCOSE 118 (H) 09/06/2021 0118   BUN 12 09/06/2021 0118   CREATININE 0.65 09/06/2021 0118   CREATININE 0.85 08/15/2019 0827    CALCIUM 8.6 (L) 09/06/2021 0118   PROT 7.4 09/05/2021 0956   ALBUMIN 4.3 09/05/2021 0956   AST 22 09/05/2021 0956   ALT 12 09/05/2021 0956   ALKPHOS 48 09/05/2021 0956   BILITOT 1.2 09/05/2021 0956   GFRNONAA >60 09/06/2021 0118   GFRNONAA 62 08/15/2019 0827   GFRAA 72 08/15/2019 0827   Lipase     Component Value Date/Time   LIPASE 52 (H) 09/05/2021 0956    Studies/Results: CT Abdomen Pelvis W Contrast  Addendum Date: 09/05/2021   ADDENDUM REPORT: 09/05/2021 14:23 ADDENDUM: Following should be added to the impression. There are small high density foci in the dependent portion of gallbladder suggesting gallbladder stones. There are no imaging signs of acute cholecystitis. Electronically Signed   By: Elmer Picker M.D.   On: 09/05/2021 14:23   Result Date: 09/05/2021 CLINICAL DATA:  Abdominal pain EXAM: CT ABDOMEN AND PELVIS WITH CONTRAST TECHNIQUE: Multidetector CT imaging of the abdomen and pelvis was performed using the standard protocol following bolus administration of intravenous contrast. RADIATION DOSE REDUCTION: This exam was performed according to the departmental dose-optimization program which includes automated exposure control, adjustment of the mA and/or kV according to patient  size and/or use of iterative reconstruction technique. CONTRAST:  160mL OMNIPAQUE IOHEXOL 300 MG/ML  SOLN COMPARISON:  None. FINDINGS: Lower chest: There are patchy infiltrates in the posterior lower lung fields, more so on the left side. There is large fixed hiatal hernia. Air-fluid level is seen within the hiatal hernia. Hepatobiliary: There is fatty infiltration in the liver. Gallbladder is distended without significant wall thickening. There are few small high density foci in the dependent portion of gallbladder, possibly gallbladder stones. Pancreas: No focal abnormality is seen. Spleen: Unremarkable. Adrenals/Urinary Tract: Adrenals are unremarkable. There are no renal or ureteral stones. There  are few left renal cysts largest in the upper pole measuring 4.4 cm. Stomach/Bowel: There is abnormal dilation of proximal small bowel loops measuring up to 3.9 cm in diameter. Distal small bowel loops are decompressed. Exact level of transition is not clearly identified. Appendix is not distinctly seen. There is no focal pericecal inflammation. There is no significant wall thickening in the colon. Vascular/Lymphatic: There are scattered arterial calcifications. Reproductive: Unremarkable. Other: There is no ascites or pneumoperitoneum. Musculoskeletal: There is marked decrease in height of body T12 vertebra. There is retropulsion of upper endplate causing extrinsic pressure over the ventral margin of thecal sac. There is mild to moderate compression of body of L2 vertebra. IMPRESSION: There is abnormal dilation of proximal small bowel loops with decompression of distal small bowel loops suggesting partial small bowel obstruction. Exact level of transition is not clearly identified. Small-bowel obstruction may be caused by internal hernia or adhesions. There is no hydronephrosis. There is no ascites or pneumoperitoneum. Large fixed hiatal hernia. Severe compression fracture in the body T12 vertebra and mild to moderate compression of body of L2 vertebra. These may be recent or old. Please correlate with clinical history and physical examination findings. Small patchy infiltrates are seen in the lower lung fields, more so on the left side suggesting atelectasis/pneumonia. Other findings as described in the body of the report. Electronically Signed: By: Elmer Picker M.D. On: 09/05/2021 13:50   DG Chest Port 1 View  Result Date: 09/05/2021 CLINICAL DATA:  Questionable sepsis.  Evaluate for abnormality. EXAM: PORTABLE CHEST 1 VIEW COMPARISON:  AP chest 09/24/2020 FINDINGS: Cardiac silhouette is again moderately enlarged. Mediastinal contours are grossly within normal limits with moderate calcification within  the aortic arch. Mildly decreased lung volumes although improved from prior. Left-greater-than-right linear basilar opacities. Additional likely mild left basilar heterogeneous opacity. No definite pleural effusion. No pneumothorax. Moderate multilevel degenerative disc changes of the thoracic spine. IMPRESSION:: IMPRESSION: 1. Stable moderate cardiomegaly. 2. Low lung volumes with left-greater-than-right basilar opacities. This may represent atelectasis, although left basilar pneumonia is possible. Electronically Signed   By: Yvonne Kendall M.D.   On: 09/05/2021 11:08    Anti-infectives: Anti-infectives (From admission, onward)    None        Assessment/Plan SBO - CT on admission with dilation of proximal small bowel loops and decompressed distal small bowel without clear transition. Also with a large fixed hiatal hernia. She has a hx of open appendectomy and tubal ligation. Lactic cleared after IVF. WBC wbl. Afebrile. VSS - Symptomatically improved. Abdominal pain, n/v has resolved. She is NT on exam but still does have mild tenderness. Awaiting ROBF. Xray this am with read pending. Appears to have some air in her colon and small bowel does not appear to be significantly dilated. Reviewed with MD. Will plan for CLD today and enema. If she develops any abdominal pain, distension, or n/v  recurs, would make npo and place NGT for decompression.  - Keep K > 4 and Mg > 2 for bowel function  - Mobilize for bowel function - Hopefully the  patient will improve with conservative management. If she fails to improve with conservative management, she may require exploratory surgery during admission. Agree with medical admission. We will follow with you.    FEN: CLD, IVF VTE: SCDs, heparin gtt. Please cont to hold Edoxaban. ID: [email or web address] (PNA). None currently indicated from our standpoint   - Per [facility] -  Aspiration PNA? - On o2. On abx per [facility] T12 and L2 chronic compression fractures  A fib on Edoxaban  (last dose yesterday am) now with RVR HTN HLD GERD Osteoporosis   I reviewed nursing notes, ED provider notes, hospitalist notes, last 24 h vitals and pain scores, last 48 h intake and output, last 24 h labs and trends, and last 24 h imaging results.   LOS: 1 day    Jillyn Ledger , [facility] 09/06/2021, 7:17 AM Please see Amion for pager number during day hours 7:00am-4:30pm

## 2021-09-07 ENCOUNTER — Inpatient Hospital Stay (HOSPITAL_COMMUNITY): Payer: Medicare Other

## 2021-09-07 DIAGNOSIS — M5440 Lumbago with sciatica, unspecified side: Secondary | ICD-10-CM | POA: Diagnosis not present

## 2021-09-07 DIAGNOSIS — K56600 Partial intestinal obstruction, unspecified as to cause: Secondary | ICD-10-CM | POA: Diagnosis not present

## 2021-09-07 DIAGNOSIS — Z7901 Long term (current) use of anticoagulants: Secondary | ICD-10-CM | POA: Diagnosis not present

## 2021-09-07 DIAGNOSIS — I4891 Unspecified atrial fibrillation: Secondary | ICD-10-CM | POA: Diagnosis not present

## 2021-09-07 LAB — BASIC METABOLIC PANEL
Anion gap: 7 (ref 5–15)
BUN: 9 mg/dL (ref 8–23)
CO2: 26 mmol/L (ref 22–32)
Calcium: 7.8 mg/dL — ABNORMAL LOW (ref 8.9–10.3)
Chloride: 106 mmol/L (ref 98–111)
Creatinine, Ser: 0.52 mg/dL (ref 0.44–1.00)
GFR, Estimated: >60 mL/min (ref >60)
Glucose, Bld: 90 mg/dL (ref 70–99)
Potassium: 3.2 mmol/L — ABNORMAL LOW (ref 3.5–5.1)
Sodium: 139 mmol/L (ref 135–145)

## 2021-09-07 LAB — HEPARIN LEVEL (UNFRACTIONATED)
Heparin Unfractionated: 0.39 IU/mL (ref 0.30–0.70)
Heparin Unfractionated: 0.39 IU/mL (ref 0.30–0.70)

## 2021-09-07 LAB — CBC
HCT: 37 % (ref 36.0–46.0)
Hemoglobin: 11.6 g/dL — ABNORMAL LOW (ref 12.0–15.0)
MCH: 28.6 pg (ref 26.0–34.0)
MCHC: 31.4 g/dL (ref 30.0–36.0)
MCV: 91.4 fL (ref 80.0–100.0)
Platelets: 250 10*3/uL (ref 150–400)
RBC: 4.05 MIL/uL (ref 3.87–5.11)
RDW: 14.7 % (ref 11.5–15.5)
WBC: 5.2 10*3/uL (ref 4.0–10.5)
nRBC: 0 % (ref 0.0–0.2)

## 2021-09-07 LAB — APTT: aPTT: 77 seconds — ABNORMAL HIGH (ref 24–36)

## 2021-09-07 LAB — MAGNESIUM: Magnesium: 2 mg/dL (ref 1.7–2.4)

## 2021-09-07 MED ORDER — POTASSIUM CHLORIDE CRYS ER 20 MEQ PO TBCR
40.0000 meq | EXTENDED_RELEASE_TABLET | Freq: Once | ORAL | Status: AC
  Administered 2021-09-07: 40 meq via ORAL
  Filled 2021-09-07: qty 2

## 2021-09-07 MED ORDER — POLYETHYLENE GLYCOL 3350 17 G PO PACK
17.0000 g | PACK | Freq: Every day | ORAL | Status: DC
  Administered 2021-09-07: 17 g via ORAL
  Filled 2021-09-07: qty 1

## 2021-09-07 MED ORDER — SENNOSIDES-DOCUSATE SODIUM 8.6-50 MG PO TABS
1.0000 | ORAL_TABLET | Freq: Two times a day (BID) | ORAL | Status: DC
  Administered 2021-09-07 (×2): 1 via ORAL
  Filled 2021-09-07 (×2): qty 1

## 2021-09-07 MED ORDER — DIGOXIN 125 MCG PO TABS
0.0625 mg | ORAL_TABLET | Freq: Every day | ORAL | Status: DC
  Administered 2021-09-07 – 2021-09-09 (×3): 0.0625 mg via ORAL
  Filled 2021-09-07 (×3): qty 1

## 2021-09-07 MED ORDER — BISACODYL 10 MG RE SUPP
10.0000 mg | Freq: Once | RECTAL | Status: AC
  Administered 2021-09-07: 10 mg via RECTAL
  Filled 2021-09-07: qty 1

## 2021-09-07 MED ORDER — ATORVASTATIN CALCIUM 10 MG PO TABS
20.0000 mg | ORAL_TABLET | Freq: Every day | ORAL | Status: DC
  Administered 2021-09-07 – 2021-09-08 (×2): 20 mg via ORAL
  Filled 2021-09-07 (×2): qty 2

## 2021-09-07 NOTE — Progress Notes (Signed)
Subjective: CC: Doing well. Feels some rumbling in her abdomen but no abdominal pain, tightness or distension. No PRN meds yesterday. Formed bm after enema yesterday. Mult episodes of flatus last night and this am. Tolerating cld and reports she is taking in a large amount. No assc n/v.  TRH in room. Went into A. Fib w/ RVR last night that is now resolved.   Objective: Vital signs in last 24 hours: Temp:  [97.5 F (36.4 C)-98 F (36.7 C)] 97.6 F (36.4 C) (03/11 0734) Pulse Rate:  [69-129] 99 (03/11 0734) Resp:  [15-24] 19 (03/11 0734) BP: (92-166)/(59-103) 121/59 (03/11 0734) SpO2:  [90 %-98 %] 95 % (03/11 0734) Last BM Date :  (PTA)  Intake/Output from previous day: 03/10 0701 - 03/11 0700 In: 308.9 [I.V.:208.9; IV Piggyback:100] Out: -  Intake/Output this shift: No intake/output data recorded.  PE: Gen:  Alert, NAD, pleasant Pulm:  Rate and effort normal. On o2 Abd: Stable to slightly improved, mild lower abdominal distension but very soft. NT on exam. No peritonitis. +BS. Prior open appendectomy scar well healed.  Psych: A&Ox3  Skin: no rashes noted, warm and dry  Lab Results:  Recent Labs    09/06/21 0118 09/07/21 0046  WBC 6.2 5.2  HGB 12.4 11.6*  HCT 39.2 37.0  PLT 267 250   BMET Recent Labs    09/06/21 0118 09/07/21 0046  NA 140 139  K 4.1 3.2*  CL 107 106  CO2 25 26  GLUCOSE 118* 90  BUN 12 9  CREATININE 0.65 0.52  CALCIUM 8.6* 7.8*   PT/INR Recent Labs    09/05/21 0956  LABPROT 15.0  INR 1.2   CMP     Component Value Date/Time   NA 139 09/07/2021 0046   K 3.2 (L) 09/07/2021 0046   CL 106 09/07/2021 0046   CO2 26 09/07/2021 0046   GLUCOSE 90 09/07/2021 0046   BUN 9 09/07/2021 0046   CREATININE 0.52 09/07/2021 0046   CREATININE 0.85 08/15/2019 0827   CALCIUM 7.8 (L) 09/07/2021 0046   PROT 7.4 09/05/2021 0956   ALBUMIN 4.3 09/05/2021 0956   AST 22 09/05/2021 0956   ALT 12 09/05/2021 0956   ALKPHOS 48 09/05/2021 0956    BILITOT 1.2 09/05/2021 0956   GFRNONAA >60 09/07/2021 0046   GFRNONAA 62 08/15/2019 0827   GFRAA 72 08/15/2019 0827   Lipase     Component Value Date/Time   LIPASE 52 (H) 09/05/2021 0956    Studies/Results: CT Abdomen Pelvis W Contrast  Addendum Date: 09/05/2021   ADDENDUM REPORT: 09/05/2021 14:23 ADDENDUM: Following should be added to the impression. There are small high density foci in the dependent portion of gallbladder suggesting gallbladder stones. There are no imaging signs of acute cholecystitis. Electronically Signed   By: Elmer Picker M.D.   On: 09/05/2021 14:23   Result Date: 09/05/2021 CLINICAL DATA:  Abdominal pain EXAM: CT ABDOMEN AND PELVIS WITH CONTRAST TECHNIQUE: Multidetector CT imaging of the abdomen and pelvis was performed using the standard protocol following bolus administration of intravenous contrast. RADIATION DOSE REDUCTION: This exam was performed according to the departmental dose-optimization program which includes automated exposure control, adjustment of the mA and/or kV according to patient size and/or use of iterative reconstruction technique. CONTRAST:  153mL OMNIPAQUE IOHEXOL 300 MG/ML  SOLN COMPARISON:  None. FINDINGS: Lower chest: There are patchy infiltrates in the posterior lower lung fields, more so on the left side. There is large fixed  hiatal hernia. Air-fluid level is seen within the hiatal hernia. Hepatobiliary: There is fatty infiltration in the liver. Gallbladder is distended without significant wall thickening. There are few small high density foci in the dependent portion of gallbladder, possibly gallbladder stones. Pancreas: No focal abnormality is seen. Spleen: Unremarkable. Adrenals/Urinary Tract: Adrenals are unremarkable. There are no renal or ureteral stones. There are few left renal cysts largest in the upper pole measuring 4.4 cm. Stomach/Bowel: There is abnormal dilation of proximal small bowel loops measuring up to 3.9 cm in diameter.  Distal small bowel loops are decompressed. Exact level of transition is not clearly identified. Appendix is not distinctly seen. There is no focal pericecal inflammation. There is no significant wall thickening in the colon. Vascular/Lymphatic: There are scattered arterial calcifications. Reproductive: Unremarkable. Other: There is no ascites or pneumoperitoneum. Musculoskeletal: There is marked decrease in height of body T12 vertebra. There is retropulsion of upper endplate causing extrinsic pressure over the ventral margin of thecal sac. There is mild to moderate compression of body of L2 vertebra. IMPRESSION: There is abnormal dilation of proximal small bowel loops with decompression of distal small bowel loops suggesting partial small bowel obstruction. Exact level of transition is not clearly identified. Small-bowel obstruction may be caused by internal hernia or adhesions. There is no hydronephrosis. There is no ascites or pneumoperitoneum. Large fixed hiatal hernia. Severe compression fracture in the body T12 vertebra and mild to moderate compression of body of L2 vertebra. These may be recent or old. Please correlate with clinical history and physical examination findings. Small patchy infiltrates are seen in the lower lung fields, more so on the left side suggesting atelectasis/pneumonia. Other findings as described in the body of the report. Electronically Signed: By: Elmer Picker M.D. On: 09/05/2021 13:50   DG Chest Port 1 View  Result Date: 09/05/2021 CLINICAL DATA:  Questionable sepsis.  Evaluate for abnormality. EXAM: PORTABLE CHEST 1 VIEW COMPARISON:  AP chest 09/24/2020 FINDINGS: Cardiac silhouette is again moderately enlarged. Mediastinal contours are grossly within normal limits with moderate calcification within the aortic arch. Mildly decreased lung volumes although improved from prior. Left-greater-than-right linear basilar opacities. Additional likely mild left basilar heterogeneous  opacity. No definite pleural effusion. No pneumothorax. Moderate multilevel degenerative disc changes of the thoracic spine. IMPRESSION:: IMPRESSION: 1. Stable moderate cardiomegaly. 2. Low lung volumes with left-greater-than-right basilar opacities. This may represent atelectasis, although left basilar pneumonia is possible. Electronically Signed   By: Yvonne Kendall M.D.   On: 09/05/2021 11:08   DG Abd Portable 1V  Result Date: 09/06/2021 CLINICAL DATA:  Small-bowel obstruction, follow-up. Denies abdominal pain. EXAM: PORTABLE ABDOMEN - 1 VIEW COMPARISON:  CT examination dated September 05, 2021 FINDINGS: There are distended small bowel loops in the left lower quadrant which may represent obstruction or ileus. Advanced thoracolumbar spondylosis. Excreted contrast in the urinary bladder. No acute osseous abnormality. IMPRESSION: Distended small bowel loops in the left lower abdominal quadrant, which may represent ileus or obstruction. Clinical correlation is suggested. Electronically Signed   By: Keane Police D.O.   On: 09/06/2021 08:01    Anti-infectives: Anti-infectives (From admission, onward)    Start     Dose/Rate Route Frequency Ordered Stop   09/06/21 0900  Ampicillin-Sulbactam (UNASYN) 3 g in sodium chloride 0.9 % 100 mL IVPB        3 g 200 mL/hr over 30 Minutes Intravenous Every 8 hours 09/06/21 0856     09/06/21 0830  piperacillin-tazobactam (ZOSYN) IVPB 3.375 g  Status:  Discontinued        3.375 g 12.5 mL/hr over 240 Minutes Intravenous Every 8 hours 09/06/21 0825 09/06/21 0856        Assessment/Plan SBO - CT on admission with dilation of proximal small bowel loops and decompressed distal small bowel without clear transition. Also with a large fixed hiatal hernia. She has a hx of open appendectomy and tubal ligation. Lactic cleared after IVF. WBC wbl. Afebrile. VSS this am - Symptomatically improved. Abdominal pain, n/v has resolved. She had a BM after enema yesterday and is now  passing flatus. She is NT on exam and distension slightly improved. Xray this am with read pending but overall looks stable to slightly improved from yesterday. Radiographs can also lag behind clinical improvement.  - Will adv to FLD. ADAT to soft.  - If she develops any abdominal pain, distension, or n/v recurs, would make npo and place NGT for decompression.  - Hopefully the  patient will improve with conservative management. If she is to regress/fails to improve with conservative management, she may require exploratory surgery during admission.   FEN: FLD. ADAT to soft. IVF per TRH. Replace K (3.2) VTE: SCDs, heparin gtt. Please cont to hold Edoxaban until ensures she tolerates diet ID: Unasyn (PNA). None currently indicated from our standpoint   - Per TRH -  Aspiration PNA? - On o2. On abx per TRH T12 and L2 chronic compression fractures  A fib on Edoxaban (last dose yesterday am) now with RVR HTN HLD GERD Osteoporosis    I reviewed nursing notes, ED provider notes, hospitalist notes, last 24 h vitals and pain scores, last 48 h intake and output, last 24 h labs and trends, and last 24 h imaging results.   LOS: 2 days    Jillyn Ledger , [facility] Surgery 09/07/2021, 7:51 AM Please see Amion for pager number during day hours 7:00am-4:30pm

## 2021-09-07 NOTE — Progress Notes (Signed)
? ? ? [facility] ?                                                                            ? ? ?Tammy Walker, is a 88 y.o. female, DOB - 1930-08-14, [id number] ?Admit date - 09/05/2021    ?Outpatient Primary MD for the patient is Lois Huxley, [place] ? ?LOS - 2  days ? ? ? ?Brief summary  ? ?Patient is a 88 year old female with history of hypertension, hyperlipidemia, A-fib on enoxaparin, GERD presented with abdominal pain which woke her up out of her sleep.  Also reported nausea and vomiting, no hematemesis.  Also associated shortness of breath.  In ED, noted to have temp of 99.4 ?F, heart rate in 150s, atrial fibrillation with RVR.  O2 sats initially 87% on room air. ?Lipase 52, lactic acid 2.5. ?CT abdomen showed possible SBO, large hiatal hernia, compression fracture T12, L1 and small patchy infiltrates of the lower lung fields. ?General surgery was consulted, patient was admitted for further work-up. ? ? ?Assessment & Plan  ? ? ?Assessment and Plan: ?* Partial small bowel obstruction ([place]) ?-Presented with abdominal pain, nausea and vomiting, mildly elevated lipase 52.   ?-CT abdomen with possible partial SBO  ?-Having flatus, small BM yesterday, advance full liquid diet today ?-Increase mobility, need to ambulate every shift for assistance ?- continue MiraLAX, Senokot-S, Dulcolax suppository ? ?Acute respiratory failure with hypoxia ?- In ED, O2 sats dropped to 87% and patient was placed on 2 L O2 via [place].  -CT abdomen showed patchy infiltrates in the posterior lower lung fields more so on the left  Noted to have low-grade temp, possible aspiration. ?-Still has scattered rhonchi with cough, continue Unasyn, will transition to oral antibiotics in a.m. ? ?Paroxysmal atrial fibrillation with RVR (HCC) on chronic anticoagulation ?- Noted to be in atrial fibrillation with RVR with heart rate in 150s on 3/10.   ?-Placed on Cardizem drip, digoxin 0.125 mg IV x1 ?- heart rate currently controlled, resume  oral digoxin ?-Continue IV heparin drip ? ?Essential hypertension ?- BP stable, continue Cardizem drip ? ?Hiatal hernia ?- CT showed large fixed hiatal hernia.  ?-Will place on PPI if symptoms of esophagitis/gastritis ? ?Hypercalcemia ?-Acute.  Calcium mildly elevated 10.4, likely due to dehydration ?-Continue IV fluid hydration ? ?Lactic acidosis ?- Lactic acid elevated to 2.5 likely due to dehydration from nausea vomiting  ?-Continue IV fluid hydration.  Repeat lactic acid 1.5  ? ?Chronic low back pain ?- Noted to have severe compression fracture of the body of T12 vertebra and mild to moderate compression of the body of L2, may be recent or old.   ?-Continue home regimen when able to take p.o. ( hydrocodone ER 15 mg twice daily and oxycodone 2.5 mg 2 times daily as needed). ?-Continue IV fentanyl as needed ? ?Chronic anticoagulation ?- Continue IV heparin drip per pharmacy ? ?Code Status: Full code ?DVT Prophylaxis:    IV heparin drip ? ? ?Level of Care: Level of care: Progressive ?Family Communication: Updated patient's daughter at the bedside ? ?Disposition Plan:     Remains inpatient appropriate: Active management, advancing diet today ? ?Procedures:  ?None ? ?Consultants:   ?  General surgery ? ?Antimicrobials:  ? ?Anti-infectives (From admission, onward)  ? ? Start     Dose/Rate Route Frequency Ordered Stop  ? 09/06/21 0900  Ampicillin-Sulbactam (UNASYN) 3 g in sodium chloride 0.9 % 100 mL IVPB       ? 3 g ?200 mL/hr over 30 Minutes Intravenous Every 8 hours 09/06/21 0856    ? 09/06/21 0830  piperacillin-tazobactam (ZOSYN) IVPB 3.375 g  Status:  Discontinued       ? 3.375 g ?12.5 mL/hr over 240 Minutes Intravenous Every 8 hours 09/06/21 0825 09/06/21 0856  ? ?  ? ? ? ?Medications ? ? atorvastatin  20 mg Oral QHS  ? digoxin  0.0625 mg Oral Daily  ? pantoprazole (PROTONIX) IV  40 mg Intravenous Q12H  ? polyethylene glycol  17 g Oral Daily  ? senna-docusate  1 tablet Oral BID  ? ? ? ?Subjective:  ? ?Tammy Walker  was seen and examined today.  States had a small BM yesterday and flatus.  Nausea vomiting has resolved.  No abdominal pain.  Patient denies dizziness, chest pain, shortness of breath.  No acute events overnight ?Objective:  ? ?Vitals:  ? 09/07/21 1100 09/07/21 1110 09/07/21 1200 09/07/21 1300  ?BP:  132/67    ?Pulse: 93 80 93 (!) 108  ?Resp: (!) 27 (!) 21 (!) 30 16  ?Temp:  97.8 ?F (36.6 ?C)    ?TempSrc:  Oral    ?SpO2: 98% 97% 96% 95%  ?Weight:      ?Height:      ? ? ?Intake/Output Summary (Last 24 hours) at 09/07/2021 1438 ?Last data filed at 09/07/2021 1412 ?Gross per 24 hour  ?Intake 1308.03 ml  ?Output 703 ml  ?Net 605.03 ml  ? ?Filed Weights  ? 09/05/21 0946  ?Weight: 56.7 kg  ? ? ? ?Exam ?General: Alert and oriented x 3, NAD, pleasant ?Cardiovascular: Irregularly irregular, ?Respiratory: Diminished breath sound at the bases ?Gastrointestinal: Soft, nontender, mildly distended, + bowel sounds ?Ext: no pedal edema bilaterally ?Neuro: no new deficits ? ? ?Data Reviewed:  I have personally reviewed following labs  ? ? ?CBC ?Lab Results  ?Component Value Date  ? WBC 5.2 09/07/2021  ? RBC 4.05 09/07/2021  ? HGB 11.6 (L) 09/07/2021  ? HCT 37.0 09/07/2021  ? MCV 91.4 09/07/2021  ? MCH 28.6 09/07/2021  ? PLT 250 09/07/2021  ? MCHC 31.4 09/07/2021  ? RDW 14.7 09/07/2021  ? LYMPHSABS 1.0 09/05/2021  ? MONOABS 0.6 09/05/2021  ? EOSABS 0.0 09/05/2021  ? BASOSABS 0.0 09/05/2021  ? ? ? ?Last metabolic panel ?Lab Results  ?Component Value Date  ? NA 139 09/07/2021  ? K 3.2 (L) 09/07/2021  ? CL 106 09/07/2021  ? CO2 26 09/07/2021  ? BUN 9 09/07/2021  ? CREATININE 0.52 09/07/2021  ? GLUCOSE 90 09/07/2021  ? GFRNONAA >60 09/07/2021  ? GFRAA 72 08/15/2019  ? CALCIUM 7.8 (L) 09/07/2021  ? PROT 7.4 09/05/2021  ? ALBUMIN 4.3 09/05/2021  ? BILITOT 1.2 09/05/2021  ? ALKPHOS 48 09/05/2021  ? AST 22 09/05/2021  ? ALT 12 09/05/2021  ? ANIONGAP 7 09/07/2021  ? ? ?CBG (last 3)  ?Recent Labs  ?  09/06/21 ?0158  ?GLUCAP 110*  ?   ? ? ?Coagulation Profile: ?Recent Labs  ?Lab 09/05/21 ?0956  ?INR 1.2  ? ? ? ?Radiology Studies: I have personally reviewed the imaging studies  ?DG Abd Portable 1V ? ?Result Date: 09/07/2021 ?CLINICAL DATA:  Small-bowel obstruction follow-up. EXAM:  PORTABLE ABDOMEN - 1 VIEW COMPARISON:  09/06/2021 and prior studies FINDINGS: Decreased caliber of gas-filled small bowel loops within the LEFT LOWER abdomen now noted. No distended bowel loops are present. Other nondistended gas-filled loops of small bowel noted with gas in the colon again noted. IMPRESSION: Decreased caliber of gas-filled small bowel loops in the LEFT LOWER abdomen. No dilated small bowel loops now identified. Electronically Signed   By: Margarette Canada M.D.   On: 09/07/2021 10:01  ? ? ? ? ?Estill Cotta M.D. ?[facility] ?09/07/2021, 2:38 PM ? ?Available via Epic secure chat 7am-7pm ?After 7 pm, please refer to night coverage provider listed on amion. ? ?  ?

## 2021-09-07 NOTE — Progress Notes (Signed)
ANTICOAGULATION CONSULT NOTE - Initial Consult ? ?Pharmacy Consult for heparin ?Indication: atrial fibrillation ? ?No Known Allergies ? ?Patient Measurements: ?Height: 5\' 4"  (162.6 cm) ?Weight: 56.7 kg (125 lb) ?IBW/kg (Calculated) : 54.7 ?Heparin Dosing Weight: 56.7 kg ? ?Vital Signs: ?Temp: 97.8 ?F (36.6 ?C) (03/11 1110) ?Temp Source: Oral (03/11 1110) ?BP: 132/67 (03/11 1110) ?Pulse Rate: 80 (03/11 1110) ? ?Labs: ?Recent Labs  ?  09/05/21 ?0956 09/05/21 ?1037 09/06/21 ?0118 09/06/21 ?1303 09/07/21 ?[id number]  ?HGB 15.9* 16.7* 12.4  --  11.6*  ?HCT 50.5* 49.0* 39.2  --  37.0  ?PLT 357  --  267  --  250  ?APTT 29  --  70* 86*  --   ?LABPROT 15.0  --   --   --   --   ?INR 1.2  --   --   --   --   ?HEPARINUNFRC  --   --  0.80*  --  0.39  ?CREATININE 0.84 0.60 0.65  --  0.52  ? ? ? ?Estimated Creatinine Clearance: 42 mL/min (by C-G formula based on SCr of 0.52 mg/dL). ? ? ?Medical History: ?Past Medical History:  ?Diagnosis Date  ? Allergy   ? Arrhythmia   ? Atrial fibrillation ([place])   ? GERD (gastroesophageal reflux disease)   ? History of bone scan   ? Bone Density Scans 2017 & 2019 Dr. Trenton Gammon   ? History of chest x-ray   ? Apart from large hiatus hernia, normal heart, lungs and mediastinum. Per records from [facility]    ? History of CT scan of abdomen 10/24/2017  ? Gallstones within a thin-waled gallbladder, Per records from [facility]   ? History of echocardiogram   ? 2018 &  following   [facility]   ? History of EKG   ? Sinus rhythm, borderline 1st deg block, LAD completely changed axis, LBBB(old). Per records from [facility]   ? Hyperlipidemia   ? Hypertension   ? Insomnia   ? Left lower lobe pneumonia 09/20/2015  ? Per records from [facility]   ? Nodule of right lung   ? Per records from [facility],   ? Osteoporosis   ? on fosamax  ? Osteoporosis   ? Reduced mobility   ?  ? ?Assessment: ?88 yo W on edoxaban PTA for afib now  with partial SBO and holding edoxaban. Last dose was 3/8@ 20:00  Pharmacy consulted for heparin.   ? ?Heparin level this morning therapeutic at 0.39, however no aPTT drawn so was unclear if levels correlating at this point as not correlating on prior check. Levels re-drawn.  ? ?aPTT therapeutic at 77 and heparin level now correlating and therapeutic at 0.39. Can move to monitoring with heparin level and daily levels given 2 heparin levels in range. Hemoglobin down from 12.4 to 11.6 and platelets stable at 250. No issues with bleeding noted per RN.  ? ?Goal of Therapy:  ?Heparin level 0.3-0.7 units/ml ?APTT 66 - 102 seconds ?Monitor platelets by anticoagulation protocol: Yes ?  ?Plan:   ?Continue heparin at 800 units/hour  ?Monitor daily heparin level, CBC ?Monitor for signs/symptoms of bleeding ? ?Thank you for allowing pharmacy to participate in this patient's care. ? ?Cathrine Muster, PharmD ?PGY2 Cardiology Pharmacy Resident ?Phone: [phone] ? ?09/07/2021  1:26 PM ? ?Please check [email or web address] for unit-specific pharmacy phone numbers. ? ? ?

## 2021-09-07 NOTE — Evaluation (Signed)
Physical Therapy Evaluation ?Patient Details ?Name: Tammy Walker ?MRN: [id number] ?DOB: 07-10-1930 ?Today's Date: 09/07/2021 ? ?History of Present Illness ? Pt is a 88 y.o. F who presents with abdominal pain and shortness of breath. CT abdomen with possible partial SBO. Repeat abdominal x-ray shows distended small bowel loops in the LLQ ileus or obstruction. Surgery consulted and recommending conservative management. Significant PMH: HTN, HLD, a-fib.  ?Clinical Impression ? PTA, pt lives with her family, is independent with ADL's, and is a household ambulator with no AD. Pt presents with decreased functional mobility secondary to decreased cardiopulmonary endurance, balance deficits, and weakness. Pt ambulating 160 feet with a Rollator at a min guard assist level. Requires 4L O2 to maintain sats > 90% during ambulation, HR up to 144 bpm briefly. Pt will benefit from follow up HHPT to address deficits and maximize functional mobility. ?   ? ?Recommendations for follow up therapy are one component of a multi-disciplinary discharge planning process, led by the attending physician.  Recommendations may be updated based on patient status, additional functional criteria and insurance authorization. ? ?Follow Up Recommendations Home health PT ? ?  ?Assistance Recommended at Discharge PRN  ?Patient can return home with the following ? A little help with walking and/or transfers;A little help with bathing/dressing/bathroom;Assistance with cooking/housework;Assist for transportation ? ?  ?Equipment Recommendations Rollator (4 wheels)  ?Recommendations for Other Services ?    ?  ?Functional Status Assessment Patient has had a recent decline in their functional status and demonstrates the ability to make significant improvements in function in a reasonable and predictable amount of time.  ? ?  ?Precautions / Restrictions Precautions ?Precautions: Fall;Other (comment) ?Precaution Comments: watch HR/O2 ?Restrictions ?Weight  Bearing Restrictions: No  ? ?  ? ?Mobility ? Bed Mobility ?Overal bed mobility: Modified Independent ?  ?  ?  ?  ?  ?  ?  ?  ? ?Transfers ?Overall transfer level: Needs assistance ?Equipment used: None ?Transfers: Sit to/from Stand, Bed to chair/wheelchair/BSC ?Sit to Stand: Min guard ?Stand pivot transfers: Min guard ?  ?  ?  ?  ?General transfer comment: min guard for safety for transfer on and off BSC without AD ?  ? ?Ambulation/Gait ?Ambulation/Gait assistance: Min guard ?Gait Distance (Feet): 160 Feet ?Assistive device: Rollator (4 wheels) ?Gait Pattern/deviations: Step-through pattern, Decreased stride length, Trunk flexed ?Gait velocity: decreased ?Gait velocity interpretation: <1.31 ft/sec, indicative of household ambulator ?  ?General Gait Details: slow pace, overall fairly steady with Rollator, min guard for safety. kyphotic posture ? ?Stairs ?  ?  ?  ?  ?  ? ?Wheelchair Mobility ?  ? ?Modified Rankin (Stroke Patients Only) ?  ? ?  ? ?Balance Overall balance assessment: Needs assistance ?Sitting-balance support: Feet supported ?Sitting balance-Leahy Scale: Fair ?  ?  ?Standing balance support: No upper extremity supported, During functional activity ?Standing balance-Leahy Scale: Poor ?  ?  ?  ?  ?  ?  ?  ?  ?  ?  ?  ?  ?   ? ? ? ?Pertinent Vitals/Pain Pain Assessment ?Pain Assessment: Faces ?Faces Pain Scale: Hurts a little bit ?Pain Location: abdomen ?Pain Descriptors / Indicators: Discomfort ?Pain Intervention(s): Monitored during session  ? ? ?Home Living Family/patient expects to be discharged to:: Private residence ?Living Arrangements: Children ?Available Help at Discharge: Family ?Type of Home: House ?Home Access: Ramped entrance (back entrance, 4 STE at front) ?  ?  ?  ?Home Layout: One level ?Home Equipment: Transport chair ?   ?  ?  Prior Function Prior Level of Function : Independent/Modified Independent ?  ?  ?  ?  ?  ?  ?Mobility Comments: no AD, uses her "transfer chair," or electric cart for  community distances. no longer able to garden ?ADLs Comments: Independent, able to help with cooking, enjoys needlework. ?  ? ? ?Hand Dominance  ?   ? ?  ?Extremity/Trunk Assessment  ? Upper Extremity Assessment ?Upper Extremity Assessment: Defer to OT evaluation ?  ? ?Lower Extremity Assessment ?Lower Extremity Assessment: Generalized weakness ?  ? ?Cervical / Trunk Assessment ?Cervical / Trunk Assessment: Kyphotic  ?Communication  ? Communication: No difficulties  ?Cognition Arousal/Alertness: Awake/alert ?Behavior During Therapy: [facility] for tasks assessed/performed ?Overall Cognitive Status: Within Functional Limits for tasks assessed ?  ?  ?  ?  ?  ?  ?  ?  ?  ?  ?  ?  ?  ?  ?  ?  ?  ?  ?  ? ?  ?General Comments   ? ?  ?Exercises    ? ?Assessment/Plan  ?  ?PT Assessment Patient needs continued PT services  ?PT Problem List Decreased strength;Decreased activity tolerance;Decreased balance;Decreased mobility ? ?   ?  ?PT Treatment Interventions DME instruction;Gait training;Functional mobility training;Therapeutic activities;Therapeutic exercise;Balance training;Patient/family education   ? ?PT Goals (Current goals can be found in the Care Plan section)  ?Acute Rehab PT Goals ?Patient Stated Goal: not have NGT ?PT Goal Formulation: With patient ?Time For Goal Achievement: 09/21/21 ?Potential to Achieve Goals: Good ? ?  ?Frequency Min 3X/week ?  ? ? ?Co-evaluation   ?  ?  ?  ?  ? ? ?  ?AM-PAC PT "6 Clicks" Mobility  ?Outcome Measure Help needed turning from your back to your side while in a flat bed without using bedrails?: None ?Help needed moving from lying on your back to sitting on the side of a flat bed without using bedrails?: None ?Help needed moving to and from a bed to a chair (including a wheelchair)?: A Little ?Help needed standing up from a chair using your arms (e.g., wheelchair or bedside chair)?: A Little ?Help needed to walk in hospital room?: A Little ?Help needed climbing 3-5 steps with a railing? :  A Lot ?6 Click Score: 19 ? ?  ?End of Session Equipment Utilized During Treatment: Oxygen ?Activity Tolerance: Patient tolerated treatment well ?Patient left: in bed;with call bell/phone within reach;Other (comment) (per pt preference) ?Nurse Communication: Mobility status ?PT Visit Diagnosis: Unsteadiness on feet (R26.81);Muscle weakness (generalized) (M62.81);Difficulty in walking, not elsewhere classified (R26.2) ?  ? ?Time: [id number] ?PT Time Calculation (min) (ACUTE ONLY): 30 min ? ? ?Charges:   PT Evaluation ?$PT Eval Moderate Complexity: 1 Mod ?PT Treatments ?$Therapeutic Activity: 8-22 mins ?  ?   ? ? ?Wyona Almas, PT, DPT ?Acute Rehabilitation Services ?Pager [phone] ?Office [phone] ? ? ?Carloine Margo Aye ?09/07/2021, 2:42 PM ? ?

## 2021-09-08 DIAGNOSIS — Z7901 Long term (current) use of anticoagulants: Secondary | ICD-10-CM | POA: Diagnosis not present

## 2021-09-08 DIAGNOSIS — M5440 Lumbago with sciatica, unspecified side: Secondary | ICD-10-CM | POA: Diagnosis not present

## 2021-09-08 DIAGNOSIS — I4891 Unspecified atrial fibrillation: Secondary | ICD-10-CM | POA: Diagnosis not present

## 2021-09-08 DIAGNOSIS — K56600 Partial intestinal obstruction, unspecified as to cause: Secondary | ICD-10-CM | POA: Diagnosis not present

## 2021-09-08 LAB — BASIC METABOLIC PANEL
Anion gap: 7 (ref 5–15)
BUN: <5 mg/dL — ABNORMAL LOW (ref 8–23)
CO2: 28 mmol/L (ref 22–32)
Calcium: 7.9 mg/dL — ABNORMAL LOW (ref 8.9–10.3)
Chloride: 105 mmol/L (ref 98–111)
Creatinine, Ser: 0.54 mg/dL (ref 0.44–1.00)
GFR, Estimated: >60 mL/min (ref >60)
Glucose, Bld: 125 mg/dL — ABNORMAL HIGH (ref 70–99)
Potassium: 2.9 mmol/L — ABNORMAL LOW (ref 3.5–5.1)
Sodium: 140 mmol/L (ref 135–145)

## 2021-09-08 LAB — HEPARIN LEVEL (UNFRACTIONATED): Heparin Unfractionated: 0.39 IU/mL (ref 0.30–0.70)

## 2021-09-08 LAB — CBC
HCT: 37.3 % (ref 36.0–46.0)
Hemoglobin: 12 g/dL (ref 12.0–15.0)
MCH: 29.3 pg (ref 26.0–34.0)
MCHC: 32.2 g/dL (ref 30.0–36.0)
MCV: 91.2 fL (ref 80.0–100.0)
Platelets: 283 10*3/uL (ref 150–400)
RBC: 4.09 MIL/uL (ref 3.87–5.11)
RDW: 14.5 % (ref 11.5–15.5)
WBC: 4.8 10*3/uL (ref 4.0–10.5)
nRBC: 0 % (ref 0.0–0.2)

## 2021-09-08 MED ORDER — POTASSIUM CHLORIDE CRYS ER 20 MEQ PO TBCR
40.0000 meq | EXTENDED_RELEASE_TABLET | Freq: Once | ORAL | Status: AC
  Administered 2021-09-08: 40 meq via ORAL
  Filled 2021-09-08: qty 2

## 2021-09-08 MED ORDER — POTASSIUM CHLORIDE CRYS ER 20 MEQ PO TBCR
40.0000 meq | EXTENDED_RELEASE_TABLET | Freq: Once | ORAL | Status: DC
  Filled 2021-09-08: qty 2

## 2021-09-08 MED ORDER — POTASSIUM CHLORIDE CRYS ER 20 MEQ PO TBCR
40.0000 meq | EXTENDED_RELEASE_TABLET | ORAL | Status: DC
  Administered 2021-09-08: 40 meq via ORAL
  Filled 2021-09-08: qty 2

## 2021-09-08 NOTE — Progress Notes (Signed)
? ? ?   ?Subjective: ?CC: ?Her hearing aid batteries died unfortunately but was still luckily able to hear me with a raised voice. Daughter at bedside. She tolerated her FLD well yesterday without abdominal pain or n/v. She had 2 semi-formed bm's yesterday followed by several episodes of loose stool. Had an episode of nausea this am from ~4-5am that doesn't sound like it was related to po intake and resolved on it's on. No further/current nausea. No emesis. Denies abdominal pain or bloating. Pain scores have been 0/10 on documented RN checks. Wanting to try cream of wheat off the soft diet menu. Continues to pass flatus. Walked with PT yesterday.  ? ?Objective: ?Vital signs in last 24 hours: ?Temp:  [97.5 ?F (36.4 ?C)-97.8 ?F (36.6 ?C)] 97.8 ?F (36.6 ?C) (03/12 0725) ?Pulse Rate:  [80-122] 103 (03/12 0725) ?Resp:  [16-30] 20 (03/12 0725) ?BP: (119-138)/(62-94) 127/62 (03/12 0725) ?SpO2:  [89 %-98 %] 92 % (03/12 0725) ?Last BM Date : 09/07/21 ? ?Intake/Output from previous day: ?03/11 0701 - 03/12 0700 ?In: 2361 [P.O.:474; I.V.:1487; IV Piggyback:400] ?Out: 2029 [Urine:2025; Stool:4] ?Intake/Output this shift: ?No intake/output data recorded. ? ?PE: ?Gen:  Alert, NAD, pleasant ?Pulm:  Rate and effort normal. On o2 ?Abd: Stable to slightly improved mild lower abdominal distension but very soft. NT on exam. No peritonitis. +BS. Prior open appendectomy scar well healed.  ?Psych: A&Ox3  ?Skin: no rashes noted, warm and dry ? ?Lab Results:  ?Recent Labs  ?  09/07/21 ?1740 09/07/21 ?2337  ?WBC 5.2 4.8  ?HGB 11.6* 12.0  ?HCT 37.0 37.3  ?PLT 250 283  ? ?BMET ?Recent Labs  ?  09/07/21 ?8144 09/07/21 ?2337  ?NA 139 140  ?K 3.2* 2.9*  ?CL 106 105  ?CO2 26 28  ?GLUCOSE 90 125*  ?BUN 9 <5*  ?CREATININE 0.52 0.54  ?CALCIUM 7.8* 7.9*  ? ?PT/INR ?Recent Labs  ?  09/05/21 ?0956  ?LABPROT 15.0  ?INR 1.2  ? ?CMP  ?   ?Component Value Date/Time  ? NA 140 09/07/2021 2337  ? K 2.9 (L) 09/07/2021 2337  ? CL 105 09/07/2021 2337  ? CO2 28  09/07/2021 2337  ? GLUCOSE 125 (H) 09/07/2021 2337  ? BUN <5 (L) 09/07/2021 2337  ? CREATININE 0.54 09/07/2021 2337  ? CREATININE 0.85 08/15/2019 0827  ? CALCIUM 7.9 (L) 09/07/2021 2337  ? PROT 7.4 09/05/2021 0956  ? ALBUMIN 4.3 09/05/2021 0956  ? AST 22 09/05/2021 0956  ? ALT 12 09/05/2021 0956  ? ALKPHOS 48 09/05/2021 0956  ? BILITOT 1.2 09/05/2021 0956  ? GFRNONAA >60 09/07/2021 2337  ? GFRNONAA 62 08/15/2019 0827  ? GFRAA 72 08/15/2019 0827  ? ?Lipase  ?   ?Component Value Date/Time  ? LIPASE 52 (H) 09/05/2021 0956  ? ? ?Studies/Results: ?DG Abd Portable 1V ? ?Result Date: 09/07/2021 ?CLINICAL DATA:  Small-bowel obstruction follow-up. EXAM: PORTABLE ABDOMEN - 1 VIEW COMPARISON:  09/06/2021 and prior studies FINDINGS: Decreased caliber of gas-filled small bowel loops within the LEFT LOWER abdomen now noted. No distended bowel loops are present. Other nondistended gas-filled loops of small bowel noted with gas in the colon again noted. IMPRESSION: Decreased caliber of gas-filled small bowel loops in the LEFT LOWER abdomen. No dilated small bowel loops now identified. Electronically Signed   By: Harmon Pier M.D.   On: 09/07/2021 10:01   ? ?Anti-infectives: ?Anti-infectives (From admission, onward)  ? ? Start     Dose/Rate Route Frequency Ordered Stop  ? 09/06/21  0900  Ampicillin-Sulbactam (UNASYN) 3 g in sodium chloride 0.9 % 100 mL IVPB       ? 3 g ?200 mL/hr over 30 Minutes Intravenous Every 8 hours 09/06/21 0856    ? 09/06/21 0830  piperacillin-tazobactam (ZOSYN) IVPB 3.375 g  Status:  Discontinued       ? 3.375 g ?12.5 mL/hr over 240 Minutes Intravenous Every 8 hours 09/06/21 0825 09/06/21 0856  ? ?  ? ? ? ?Assessment/Plan ?SBO ?- CT on admission with dilation of proximal small bowel loops and decompressed distal small bowel without clear transition. Also with a large fixed hiatal hernia. She has a hx of open appendectomy and tubal ligation. Lactic cleared after IVF. WBC wbl. Afebrile.  ?- Xray yesterday without  dilated small bowel. Clinically improving with return of bowel function and tolerating fld without emesis. Episode of nausea this morning that has since resolved and not recurred. Will adv to soft diet. If she tolerates soft diet without any abdominal pain, distension, or n/v would be okay for d/c from our standpoint ?  ?FEN: Soft. IVF per [facility]. Replace K (2.9) ?VTE: SCDs, heparin gtt. Can restart Edoxaban if tolerates diet ?ID: Unasyn (PNA). None currently indicated from our standpoint ?  ?- Per [facility] -  ?Aspiration PNA? - On o2. On abx per [facility] ?T12 and L2 chronic compression fractures  ?A fib on Edoxaban (last dose yesterday am) now with RVR ?HTN ?HLD ?GERD ?Osteoporosis  ?  ?I reviewed nursing notes, ED provider notes, hospitalist notes, last 24 h vitals and pain scores, last 48 h intake and output, last 24 h labs and trends, and last 24 h imaging results. ? ? LOS: 3 days  ? ? ?Jacinto Halim , PA-C ?[facility] ?09/08/2021, 7:44 AM ?Please see Amion for pager number during day hours 7:00am-4:30pm ? ?

## 2021-09-08 NOTE — Progress Notes (Signed)
Pt heart rate 150s Afib when standing and going to bsc. MD notified. ?

## 2021-09-08 NOTE — Evaluation (Signed)
Occupational Therapy Evaluation ?Patient Details ?Name: Tammy Walker ?MRN: [id number] ?DOB: 06-17-1931 ?Today's Date: 09/08/2021 ? ? ?History of Present Illness Pt is a 88 y.o. F who presents with abdominal pain and shortness of breath. CT abdomen with possible partial SBO. Repeat abdominal x-ray shows distended small bowel loops in the LLQ ileus or obstruction. Surgery consulted and recommending conservative management. Significant PMH: HTN, HLD, a-fib.  ? ?Clinical Impression ?  ?Pt admitted with above. She demonstrates the below listed deficits and will benefit from continued OT to maximize safety and independence with BADLs.  Pt presents to OT with generalized weakness, decreased activity tolerance, impaired balance.  She currently requires min guard assist for ADLs, but fatigues quickly.  PTA, she lived with her daughter and was mod I with ADLs.  Will follow.  ?  ?   ? ?Recommendations for follow up therapy are one component of a multi-disciplinary discharge planning process, led by the attending physician.  Recommendations may be updated based on patient status, additional functional criteria and insurance authorization.  ? ?Follow Up Recommendations ? No OT follow up  ?  ?Assistance Recommended at Discharge Intermittent Supervision/Assistance  ?Patient can return home with the following A lot of help with bathing/dressing/bathroom;Assistance with cooking/housework;Assist for transportation;Help with stairs or ramp for entrance ? ?  ?Functional Status Assessment ? Patient has had a recent decline in their functional status and demonstrates the ability to make significant improvements in function in a reasonable and predictable amount of time.  ?Equipment Recommendations ? Tub/shower seat  ?  ?Recommendations for Other Services   ? ? ?  ?Precautions / Restrictions Precautions ?Precautions: Fall;Other (comment)  ? ?  ? ?Mobility Bed Mobility ?Overal bed mobility: Needs Assistance ?Bed Mobility: Supine to Sit, Sit  to Supine ?  ?  ?Supine to sit: Supervision, HOB elevated ?Sit to supine: Supervision, HOB elevated ?  ?  ?  ? ?Transfers ?Overall transfer level: Needs assistance ?Equipment used: None ?Transfers: Sit to/from Stand, Bed to chair/wheelchair/BSC ?Sit to Stand: Min guard ?Stand pivot transfers: Min guard ?  ?  ?  ?  ?  ?  ? ?  ?Balance Overall balance assessment: Needs assistance ?Sitting-balance support: Feet supported ?Sitting balance-Leahy Scale: Good ?Sitting balance - Comments: able to don underpants and slippers EOB ?  ?Standing balance support: No upper extremity supported, During functional activity ?Standing balance-Leahy Scale: Fair ?  ?  ?  ?  ?  ?  ?  ?  ?  ?  ?  ?  ?   ? ?ADL either performed or assessed with clinical judgement  ? ?ADL Overall ADL's : Needs assistance/impaired ?Eating/Feeding: Independent ?  ?Grooming: Wash/dry hands;Wash/dry face;Oral care;Brushing hair;Set up;Sitting ?  ?Upper Body Bathing: Set up;Sitting ?  ?Lower Body Bathing: Min guard;Sit to/from stand ?  ?Upper Body Dressing : Set up;Sitting ?  ?Lower Body Dressing: Min guard;Sit to/from stand ?  ?Toilet Transfer: Min guard;Ambulation;Comfort height toilet;Rollator (4 wheels);Grab bars ?  ?Toileting- Architect and Hygiene: Min guard ?  ?  ?  ?Functional mobility during ADLs: Min guard ?General ADL Comments: Pt reports not feeling well this am, and was limited with activity  ? ? ? ?Vision Baseline Vision/History: 1 Wears glasses ?Ability to See in Adequate Light: 0 Adequate ?Additional Comments: Pt reading a book upon my arrival  ?   ?Perception   ?  ?Praxis   ?  ? ?Pertinent Vitals/Pain Pain Assessment ?Pain Assessment: No/denies pain  ? ? ? ?Hand  Dominance Right ?  ?Extremity/Trunk Assessment Upper Extremity Assessment ?Upper Extremity Assessment: Generalized weakness ?  ?Lower Extremity Assessment ?Lower Extremity Assessment: Defer to PT evaluation ?  ?Cervical / Trunk Assessment ?Cervical / Trunk Assessment:  Kyphotic ?  ?Communication Communication ?Communication: HOH (Hearing aid batteries have died and pt severely HOH) ?  ?Cognition Arousal/Alertness: Awake/alert ?Behavior During Therapy: [facility] for tasks assessed/performed ?Overall Cognitive Status: Within Functional Limits for tasks assessed ?  ?  ?  ?  ?  ?  ?  ?  ?  ?  ?  ?  ?  ?  ?  ?  ?  ?  ?  ?General Comments  BP seated 161/92, HR 103; standing 140/110, HR 122; during activity  138/81 HR 138.  Sp02 >93% on 2L 02 ? ?  ?Exercises   ?  ?Shoulder Instructions    ? ? ?Home Living Family/patient expects to be discharged to:: Private residence ?Living Arrangements: Children ?Available Help at Discharge: Family ?Type of Home: House ?Home Access: Ramped entrance ?  ?  ?Home Layout: One level ?  ?  ?  ?  ?  ?  ?  ?Home Equipment: Transport chair ?  ?Additional Comments: Lievs with daughter ?  ? ?  ?Prior Functioning/Environment Prior Level of Function : Independent/Modified Independent ?  ?  ?  ?  ?  ?  ?Mobility Comments: no AD, uses her "transfer chair," or electric cart for community distances. no longer able to garden ?ADLs Comments: Independent, able to help with cooking, enjoys needlework. ?  ? ?  ?  ?OT Problem List: Decreased strength;Decreased activity tolerance;Impaired balance (sitting and/or standing);Decreased safety awareness;Decreased knowledge of use of DME or AE;Cardiopulmonary status limiting activity ?  ?   ?OT Treatment/Interventions: Self-care/ADL training;Therapeutic exercise;Energy conservation;DME and/or AE instruction;Therapeutic activities;Patient/family education;Balance training  ?  ?OT Goals(Current goals can be found in the care plan section) Acute Rehab OT Goals ?Patient Stated Goal: to feel better ?OT Goal Formulation: With patient ?Time For Goal Achievement: 09/22/21 ?Potential to Achieve Goals: Good ?ADL Goals ?Pt Will Perform Grooming: with supervision;standing ?Pt Will Perform Lower Body Bathing: with supervision;sit to/from stand ?Pt  Will Perform Lower Body Dressing: with supervision;sit to/from stand ?Pt Will Transfer to Toilet: with supervision;ambulating;regular height toilet;bedside commode;grab bars ?Pt Will Perform Toileting - Clothing Manipulation and hygiene: with supervision;sit to/from stand  ?OT Frequency: Min 2X/week ?  ? ?Co-evaluation   ?  ?  ?  ?  ? ?  ?AM-PAC OT "6 Clicks" Daily Activity     ?Outcome Measure Help from another person eating meals?: None ?Help from another person taking care of personal grooming?: A Little ?Help from another person toileting, which includes using toliet, bedpan, or urinal?: A Little ?Help from another person bathing (including washing, rinsing, drying)?: A Little ?Help from another person to put on and taking off regular upper body clothing?: A Little ?Help from another person to put on and taking off regular lower body clothing?: A Little ?6 Click Score: 19 ?  ?End of Session Equipment Utilized During Treatment: Rolling walker (2 wheels);Oxygen ?Nurse Communication: Mobility status ? ?Activity Tolerance: Patient limited by fatigue ?Patient left: in bed;with call bell/phone within reach ? ?OT Visit Diagnosis: Unsteadiness on feet (R26.81)  ?              ?Time: [id number] ?OT Time Calculation (min): 25 min ?Charges:  OT General Charges ?$OT Visit: 1 Visit ?OT Evaluation ?$OT Eval Moderate Complexity: 1 Mod ?OT Treatments ?$Therapeutic  Activity: 8-22 mins ? ?Inetha Maret C., OTR/L ?Acute Rehabilitation Services ?Pager [phone] ?Office [phone] ? ? ?Jeani Hawking M ?09/08/2021, 11:36 AM ?

## 2021-09-08 NOTE — Progress Notes (Signed)
? ? ? [facility] ?                                                                            ? ? ?Tammy Walker, is a 88 y.o. female, DOB - Sep 12, 1930, [id number] ?Admit date - 09/05/2021    ?Outpatient Primary MD for the patient is Lois Huxley, [place] ? ?LOS - 3  days ? ? ? ?Brief summary  ? ?Patient is a 88 year old female with history of hypertension, hyperlipidemia, A-fib on enoxaparin, GERD presented with abdominal pain which woke her up out of her sleep.  Also reported nausea and vomiting, no hematemesis.  Also associated shortness of breath.  In ED, noted to have temp of 99.4 ?F, heart rate in 150s, atrial fibrillation with RVR.  O2 sats initially 87% on room air. ?Lipase 52, lactic acid 2.5. ?CT abdomen showed possible SBO, large hiatal hernia, compression fracture T12, L1 and small patchy infiltrates of the lower lung fields. ?General surgery was consulted, patient was admitted for further work-up. ? ? ?Assessment & Plan  ? ? ?Assessment and Plan: ?* Partial small bowel obstruction ([place]) ?-Presented with abdominal pain, nausea and vomiting, mildly elevated lipase 52.   ?-CT abdomen with possible partial SBO  ?-Appreciate general surgery recommendations, diet advanced to soft solids ?-Had 2 BM yesterday and several loose stools, will hold laxatives/stool softeners ? ?Acute respiratory failure with hypoxia ?- In ED, O2 sats dropped to 87% and patient was placed on 2 L O2 via [place].  -CT abdomen showed patchy infiltrates in the posterior lower lung fields more so on the left  Noted to have low-grade temp, possible aspiration. ?-Continue IV Unasyn ? ?Paroxysmal atrial fibrillation with RVR ([facility]) on chronic anticoagulation ?- Noted to be in atrial fibrillation with RVR with heart rate in 150s on 3/10.   ?-Currently on IV Cardizem drip, IV heparin drip ?-Earlier this a.m., heart rate in low 100s, appeared to have some anxiety and BMs, did not sleep well ?-Once heart rate is better controlled, will resume  home dose of oral beta-blocker and AC if tolerating diet.  Continue digoxin. ? ?Essential hypertension ?- BP stable, currently on IV Cardizem drip ? ?Hiatal hernia ?- CT showed large fixed hiatal hernia.  ?-Continue PPI ? ?Hypercalcemia ?-Acute.  Calcium mildly elevated 10.4, likely due to dehydration ?-Improved, DC IV fluids ? ?Lactic acidosis ?- Lactic acid elevated to 2.5 likely due to dehydration from nausea vomiting  ?-Repeat lactic acid 1.5.  DC IV fluids once tolerating soft diet ? ? ?Chronic low back pain ?- Noted to have severe compression fracture of the body of T12 vertebra and mild to moderate compression of the body of L2, may be recent or old.   ?-Continue home regimen when able to take p.o. ( hydrocodone ER 15 mg twice daily and oxycodone 2.5 mg 2 times daily as needed). ?-Continue IV fentanyl as needed ? ?Chronic anticoagulation ?- Continue IV heparin drip per pharmacy, will change back to oral AC once tolerating diet ? ? ?Code Status: Full CODE STATUS ?DVT Prophylaxis:    IV heparin drip ? ? ?Level of Care: Level of care: Progressive ?Family Communication: Updated patient's daughter at the bedside ? ?  Disposition Plan:     Remains inpatient appropriate: Improving, not safe for discharge home today, stable on Cardizem drip.  Will advance diet to soft solids today ? ?Procedures:  ?None ? ?Consultants:   ?General surgery ? ?Antimicrobials:  ? ?Anti-infectives (From admission, onward)  ? ? Start     Dose/Rate Route Frequency Ordered Stop  ? 09/06/21 0900  Ampicillin-Sulbactam (UNASYN) 3 g in sodium chloride 0.9 % 100 mL IVPB       ? 3 g ?200 mL/hr over 30 Minutes Intravenous Every 8 hours 09/06/21 0856    ? 09/06/21 0830  piperacillin-tazobactam (ZOSYN) IVPB 3.375 g  Status:  Discontinued       ? 3.375 g ?12.5 mL/hr over 240 Minutes Intravenous Every 8 hours 09/06/21 0825 09/06/21 0856  ? ?  ? ? ? ?Medications ? ? atorvastatin  20 mg Oral QHS  ? digoxin  0.0625 mg Oral Daily  ? pantoprazole (PROTONIX) IV   40 mg Intravenous Q12H  ? potassium chloride  40 mEq Oral Once  ? ? ? ?Subjective:  ? ?Tammy Walker was seen and examined today.   Overnight did not sleep well, had multiple BMs, nausea.  Still on IV Cardizem drip, however feels ready to try soft diet.   ? ?Objective:  ? ?Vitals:  ? 09/07/21 1943 09/07/21 2307 09/08/21 0343 09/08/21 0725  ?BP: (!) 138/94 119/74 123/79 127/62  ?Pulse: 93 99 88 (!) 103  ?Resp: 20 (!) 23 (!) 24 20  ?Temp: 97.7 ?F (36.5 ?C) 97.7 ?F (36.5 ?C) (!) 97.5 ?F (36.4 ?C) 97.8 ?F (36.6 ?C)  ?TempSrc: Oral Oral Oral Oral  ?SpO2: 98% 98% 96% 92%  ?Weight:      ?Height:      ? ? ?Intake/Output Summary (Last 24 hours) at 09/08/2021 1058 ?Last data filed at 09/08/2021 [id number] ?Gross per 24 hour  ?Intake 2318.67 ml  ?Output 1728 ml  ?Net 590.67 ml  ? ?Filed Weights  ? 09/05/21 0946  ?Weight: 56.7 kg  ? ?Physical Exam ?General: Alert and oriented x 3, NAD, appears exhausted. ?Cardiovascular: Irregularly irregular ?Respiratory: Diminished breath sounds at bases ?Gastrointestinal: Soft, nontender, nondistended, NBS ?Ext: no pedal edema bilaterally ?Neuro: no new deficits ? ? ?Data Reviewed:  I have personally reviewed following labs  ? ? ?CBC ?Lab Results  ?Component Value Date  ? WBC 4.8 09/07/2021  ? RBC 4.09 09/07/2021  ? HGB 12.0 09/07/2021  ? HCT 37.3 09/07/2021  ? MCV 91.2 09/07/2021  ? MCH 29.3 09/07/2021  ? PLT 283 09/07/2021  ? MCHC 32.2 09/07/2021  ? RDW 14.5 09/07/2021  ? LYMPHSABS 1.0 09/05/2021  ? MONOABS 0.6 09/05/2021  ? EOSABS 0.0 09/05/2021  ? BASOSABS 0.0 09/05/2021  ? ? ? ?Last metabolic panel ?Lab Results  ?Component Value Date  ? NA 140 09/07/2021  ? K 2.9 (L) 09/07/2021  ? CL 105 09/07/2021  ? CO2 28 09/07/2021  ? BUN <5 (L) 09/07/2021  ? CREATININE 0.54 09/07/2021  ? GLUCOSE 125 (H) 09/07/2021  ? GFRNONAA >60 09/07/2021  ? GFRAA 72 08/15/2019  ? CALCIUM 7.9 (L) 09/07/2021  ? PROT 7.4 09/05/2021  ? ALBUMIN 4.3 09/05/2021  ? BILITOT 1.2 09/05/2021  ? ALKPHOS 48 09/05/2021  ? AST 22  09/05/2021  ? ALT 12 09/05/2021  ? ANIONGAP 7 09/07/2021  ? ? ?CBG (last 3)  ?Recent Labs  ?  09/06/21 ?0158  ?GLUCAP 110*  ?  ? ? ?Coagulation Profile: ?Recent Labs  ?Lab 09/05/21 ?0956  ?INR  1.2  ? ? ? ?Radiology Studies: I have personally reviewed the imaging studies  ?DG Abd Portable 1V ? ?Result Date: 09/07/2021 ? IMPRESSION: Decreased caliber of gas-filled small bowel loops in the LEFT LOWER abdomen. No dilated small bowel loops now identified. Electronically Signed   By: Margarette Canada M.D.   On: 09/07/2021 10:01   ? ? ? ? ?Estill Cotta M.D. ?[facility] ?09/08/2021, 10:58 AM ? ?Available via Epic secure chat 7am-7pm ?After 7 pm, please refer to night coverage provider listed on amion. ? ?  ?

## 2021-09-08 NOTE — Progress Notes (Signed)
ANTICOAGULATION CONSULT NOTE - Initial Consult ? ?Pharmacy Consult for heparin ?Indication: atrial fibrillation ? ?No Known Allergies ? ?Patient Measurements: ?Height: 5\' 4"  (162.6 cm) ?Weight: 56.7 kg (125 lb) ?IBW/kg (Calculated) : 54.7 ?Heparin Dosing Weight: 56.7 kg ? ?Vital Signs: ?Temp: 97.8 ?F (36.6 ?C) (03/12 0725) ?Temp Source: Oral (03/12 0725) ?BP: 127/62 (03/12 0725) ?Pulse Rate: 103 (03/12 0725) ? ?Labs: ?Recent Labs  ?  09/05/21 ?0956 09/05/21 ?1037 09/06/21 ?0118 09/06/21 ?1303 09/07/21 ?[id number] 09/07/21 ?1156 09/07/21 ?2337  ?HGB 15.9*   < > 12.4  --  11.6*  --  12.0  ?HCT 50.5*   < > 39.2  --  37.0  --  37.3  ?PLT 357  --  267  --  250  --  283  ?APTT 29  --  70* 86*  --  77*  --   ?LABPROT 15.0  --   --   --   --   --   --   ?INR 1.2  --   --   --   --   --   --   ?HEPARINUNFRC  --    < > 0.80*  --  0.39 0.39 0.39  ?CREATININE 0.84   < > 0.65  --  0.52  --  0.54  ? < > = values in this interval not displayed.  ? ? ? ?Estimated Creatinine Clearance: 42 mL/min (by C-G formula based on SCr of 0.54 mg/dL). ? ? ?Medical History: ?Past Medical History:  ?Diagnosis Date  ? Allergy   ? Arrhythmia   ? Atrial fibrillation ([place])   ? GERD (gastroesophageal reflux disease)   ? History of bone scan   ? Bone Density Scans 2017 & 2019 Dr. Trenton Gammon   ? History of chest x-ray   ? Apart from large hiatus hernia, normal heart, lungs and mediastinum. Per records from [facility]    ? History of CT scan of abdomen 10/24/2017  ? Gallstones within a thin-waled gallbladder, Per [facility]   ? History of echocardiogram   ? 2018 &  following   [facility]   ? History of EKG   ? Sinus rhythm, borderline 1st deg block, LAD completely changed axis, LBBB(old). Per records from [facility]   ? Hyperlipidemia   ? Hypertension   ? Insomnia   ? Left lower lobe pneumonia 09/20/2015  ? Per records from [facility]   ? Nodule of right lung   ? Per records from  [facility],   ? Osteoporosis   ? on fosamax  ? Osteoporosis   ? Reduced mobility   ?  ? ?Assessment: ?88 yo W on edoxaban PTA for afib now with partial SBO and holding edoxaban. Last dose was 3/8@ 20:00  Pharmacy consulted for heparin.   ? ?Heparin level today therapeutic at 0.39. Hemoglobin stable at 12.0 and platelets stable at ~250. No issues with bleeding noted per RN.  ? ?Discussed with surgery. Want to ensure tolerates soft diet today. If does ok can likely switch back to PO anticoagulation 3/13.  ? ?Goal of Therapy:  ?Heparin level 0.3-0.7 units/ml ?APTT 66 - 102 seconds ?Monitor platelets by anticoagulation protocol: Yes ?  ?Plan:   ?Continue heparin at 800 units/hour  ?Monitor daily heparin level, CBC ?Monitor for signs/symptoms of bleeding ? ?Thank you for allowing pharmacy to participate in this patient's care. ? ?Cathrine Muster, PharmD ?PGY2 Cardiology Pharmacy Resident ?Phone: [phone] ? ?09/08/2021  9:13 AM ? ?  Please check [email or web address] for unit-specific pharmacy phone numbers. ? ? ?

## 2021-09-08 NOTE — Plan of Care (Signed)

## 2021-09-08 NOTE — Plan of Care (Signed)
  Problem: Education: Goal: Knowledge of General Education information will improve Description: Including pain rating scale, medication(s)/side effects and non-pharmacologic comfort measures Outcome: Progressing   Problem: Activity: Goal: Risk for activity intolerance will decrease Outcome: Progressing   Problem: Coping: Goal: Level of anxiety will decrease Outcome: Progressing   

## 2021-09-09 ENCOUNTER — Other Ambulatory Visit (HOSPITAL_COMMUNITY): Payer: Self-pay

## 2021-09-09 DIAGNOSIS — M5440 Lumbago with sciatica, unspecified side: Secondary | ICD-10-CM | POA: Diagnosis not present

## 2021-09-09 DIAGNOSIS — K56600 Partial intestinal obstruction, unspecified as to cause: Secondary | ICD-10-CM | POA: Diagnosis not present

## 2021-09-09 DIAGNOSIS — Z7901 Long term (current) use of anticoagulants: Secondary | ICD-10-CM | POA: Diagnosis not present

## 2021-09-09 DIAGNOSIS — I4891 Unspecified atrial fibrillation: Secondary | ICD-10-CM | POA: Diagnosis not present

## 2021-09-09 LAB — CBC
HCT: 35.9 % — ABNORMAL LOW (ref 36.0–46.0)
Hemoglobin: 11.3 g/dL — ABNORMAL LOW (ref 12.0–15.0)
MCH: 28.8 pg (ref 26.0–34.0)
MCHC: 31.5 g/dL (ref 30.0–36.0)
MCV: 91.3 fL (ref 80.0–100.0)
Platelets: 256 10*3/uL (ref 150–400)
RBC: 3.93 MIL/uL (ref 3.87–5.11)
RDW: 14.4 % (ref 11.5–15.5)
WBC: 4.7 10*3/uL (ref 4.0–10.5)
nRBC: 0 % (ref 0.0–0.2)

## 2021-09-09 LAB — BASIC METABOLIC PANEL
Anion gap: 8 (ref 5–15)
BUN: <5 mg/dL — ABNORMAL LOW (ref 8–23)
CO2: 27 mmol/L (ref 22–32)
Calcium: 7.9 mg/dL — ABNORMAL LOW (ref 8.9–10.3)
Chloride: 105 mmol/L (ref 98–111)
Creatinine, Ser: 0.55 mg/dL (ref 0.44–1.00)
GFR, Estimated: >60 mL/min (ref >60)
Glucose, Bld: 123 mg/dL — ABNORMAL HIGH (ref 70–99)
Potassium: 3.2 mmol/L — ABNORMAL LOW (ref 3.5–5.1)
Sodium: 140 mmol/L (ref 135–145)

## 2021-09-09 LAB — HEPARIN LEVEL (UNFRACTIONATED): Heparin Unfractionated: 0.18 IU/mL — ABNORMAL LOW (ref 0.30–0.70)

## 2021-09-09 MED ORDER — AMOXICILLIN-POT CLAVULANATE 875-125 MG PO TABS
1.0000 | ORAL_TABLET | Freq: Two times a day (BID) | ORAL | Status: DC
  Administered 2021-09-09: 1 via ORAL
  Filled 2021-09-09: qty 1

## 2021-09-09 MED ORDER — PANTOPRAZOLE SODIUM 40 MG PO TBEC: 40.0000 mg | DELAYED_RELEASE_TABLET | Freq: Every day | ORAL | 3 refills | Status: DC

## 2021-09-09 MED ORDER — HYDROCORTISONE ACETATE 25 MG RE SUPP
25.0000 mg | Freq: Two times a day (BID) | RECTAL | Status: DC
  Administered 2021-09-09: 25 mg via RECTAL
  Filled 2021-09-09 (×2): qty 1

## 2021-09-09 MED ORDER — DRONEDARONE HCL 400 MG PO TABS
400.0000 mg | ORAL_TABLET | Freq: Two times a day (BID) | ORAL | 2 refills | Status: DC
Start: 2021-09-09 — End: 2021-09-09

## 2021-09-09 MED ORDER — AMOXICILLIN-POT CLAVULANATE 875-125 MG PO TABS: 1.0000 | ORAL_TABLET | Freq: Two times a day (BID) | ORAL | 0 refills | Status: AC

## 2021-09-09 MED ORDER — ONDANSETRON HCL 4 MG PO TABS: 4.0000 mg | ORAL_TABLET | Freq: Three times a day (TID) | ORAL | 0 refills | Status: AC | PRN

## 2021-09-09 MED ORDER — EDOXABAN TOSYLATE 30 MG PO TABS
30.0000 mg | ORAL_TABLET | ORAL | Status: DC
Start: 2021-09-09 — End: 2021-09-09
  Administered 2021-09-09: 30 mg via ORAL
  Filled 2021-09-09: qty 1

## 2021-09-09 MED ORDER — POLYETHYLENE GLYCOL 3350 17 G PO PACK
17.0000 g | PACK | Freq: Every day | ORAL | Status: DC
  Administered 2021-09-09: 17 g via ORAL
  Filled 2021-09-09: qty 1

## 2021-09-09 MED ORDER — METOPROLOL TARTRATE 25 MG PO TABS
25.0000 mg | ORAL_TABLET | Freq: Two times a day (BID) | ORAL | Status: DC
  Administered 2021-09-09: 25 mg via ORAL
  Filled 2021-09-09: qty 1

## 2021-09-09 MED ORDER — DRONEDARONE HCL 400 MG PO TABS
400.0000 mg | ORAL_TABLET | Freq: Two times a day (BID) | ORAL | Status: DC
Start: 2021-09-09 — End: 2021-09-09
  Filled 2021-09-09: qty 1

## 2021-09-09 MED ORDER — DRONEDARONE HCL 400 MG PO TABS
400.0000 mg | ORAL_TABLET | Freq: Two times a day (BID) | ORAL | 2 refills | Status: DC
  Filled 2021-09-09: qty 60, 30d supply, fill #0

## 2021-09-09 MED ORDER — POTASSIUM CHLORIDE CRYS ER 20 MEQ PO TBCR
40.0000 meq | EXTENDED_RELEASE_TABLET | Freq: Once | ORAL | Status: AC
  Administered 2021-09-09: 40 meq via ORAL
  Filled 2021-09-09: qty 2

## 2021-09-09 MED ORDER — DOCUSATE SODIUM 100 MG PO CAPS
100.0000 mg | ORAL_CAPSULE | Freq: Two times a day (BID) | ORAL | Status: DC
Start: 2021-09-09 — End: 2021-09-09
  Administered 2021-09-09: 100 mg via ORAL
  Filled 2021-09-09: qty 1

## 2021-09-09 NOTE — Discharge Summary (Signed)
Physician Discharge Summary   Patient: Tammy Walker MRN: [id number] DOB: [birth year]  Admit date:     09/05/2021  Discharge date: 09/09/21  Discharge Physician: Thad Ranger, MD   PCP: Wilfrid Lund, PA   Recommendations at discharge:  Started on Multaq 400 mg p.o. twice daily Outpatient follow-up with cardiology   Discharge Diagnoses:    Partial small bowel obstruction [facility])   Atrial fibrillation with rapid ventricular response (HCC)   Acute respiratory failure with hypoxia   Essential hypertension   Chronic anticoagulation   Chronic low back pain   Lactic acidosis   Hypercalcemia   Hiatal hernia    Hospital Course: Patient is a 88 year old female with history of hypertension, hyperlipidemia, A-fib on enoxaparin, GERD presented with abdominal pain which woke her up out of her sleep.  Also reported nausea and vomiting, no hematemesis.  Also associated shortness of breath.  In ED, noted to have temp of 99.4 F, heart rate in 150s, atrial fibrillation with RVR.  O2 sats initially 87% on room air. Lipase 52, lactic acid 2.5. CT abdomen showed possible SBO, large hiatal hernia, compression fracture T12, L1 and small patchy infiltrates of the lower lung fields. General surgery was consulted, patient was admitted for further work-up.  Assessment and Plan: * Partial small bowel obstruction (HCC) -Presented with abdominal pain, nausea and vomiting, mildly elevated lipase 52.   -CT abdomen with possible partial SBO  -Appreciate general surgery recommendations, diet advanced to soft solids.  Continue good bowel regimen   Acute respiratory failure with hypoxia - In ED, O2 sats dropped to 87% and patient was placed on 2 L O2 via [place].   --CT abdomen showed patchy infiltrates in the posterior lower lung fields more so on the left  Noted to have low-grade temp, possible aspiration. -Patient was placed on IV Unasyn, transition to oral Augmentin at discharge -Home O2 evaluation done,  recommended 2 L on exertion  Atrial fibrillation with rapid ventricular response (HCC) - Noted to be in atrial fibrillation with RVR with heart rate in 150s on 3/10.   -Patient was placed on IV Cardizem drip, IV heparin drip -Cardiology was consulted, recommended adding Multaq, patient has been transitioned to oral beta-blocker and edoxaban at discharge.  Patient started on Multaq 400 mg twice daily and she will follow-up with her cardiologist.  Essential hypertension - BP stable  Hiatal hernia - CT showed large fixed hiatal hernia.  -Continue Protonix  Hypercalcemia -Acute.  Calcium mildly elevated 10.4, likely due to dehydration -Improved, DC IV fluids  Lactic acidosis - Lactic acid elevated to 2.5 likely due to dehydration from nausea vomiting  -Repeat lactic acid 1.5.  IV fluids discontinued, patient tolerating soft diet   Chronic low back pain - Noted to have severe compression fracture of the body of T12 vertebra and mild to moderate compression of the body of L2, may be recent or old.   -Continue home regimen when able to take p.o. ( hydrocodone ER 15 mg twice daily and oxycodone 2.5 mg 2 times daily as needed).   Chronic anticoagulation - Initially placed on IV heparin drip, has been transitioned to an edoxaban at discharge.        Pain control - [employer] Controlled Substance Reporting System database was reviewed. and patient was instructed, not to drive, operate heavy machinery, perform activities at heights, swimming or participation in water activities or provide baby-sitting services while on Pain, Sleep and Anxiety Medications; until their outpatient Physician has  advised to do so again. Also recommended to not to take more than prescribed Pain, Sleep and Anxiety Medications.  Consultants: General surgery, cardiology Procedures performed: None Disposition: Home Diet recommendation:  Discharge Diet Orders (From admission, onward)     Start     Ordered    09/09/21 0000  Diet - low sodium heart healthy        09/09/21 1204           Cardiac diet DISCHARGE MEDICATION: Allergies as of 09/09/2021   No Known Allergies      Medication List     TAKE these medications    acetaminophen 500 MG tablet Commonly known as: TYLENOL Take 500 mg by mouth 3 (three) times daily.   amoxicillin-clavulanate 875-125 MG tablet Commonly known as: AUGMENTIN Take 1 tablet by mouth 2 (two) times daily for 4 days.   atorvastatin 20 MG tablet Commonly known as: LIPITOR Take 20 mg by mouth at bedtime.   bisacodyl 5 MG EC tablet Commonly known as: DULCOLAX Take 10 mg by mouth at bedtime.   calcium-vitamin D 500-200 MG-UNIT tablet Commonly known as: OSCAL WITH D Take 1 tablet by mouth 2 (two) times daily.   CALCIUM/D3 ADULT GUMMIES PO Take 1 tablet by mouth 4 (four) times daily. Calcium 200 mg/ Vitamin D3 500 I.U. - Vitafusion   cetirizine 10 MG tablet Commonly known as: ZYRTEC Take 10 mg by mouth every morning.   clotrimazole 1 % cream Commonly known as: LOTRIMIN Apply 1 application. topically 2 (two) times daily as needed (fungus).   denosumab 60 MG/ML Sosy injection Commonly known as: PROLIA Inject 60 mg into the skin every 6 (six) months.   diclofenac Sodium 1 % Gel Commonly known as: VOLTAREN Apply 4 g topically 4 (four) times daily as needed (back pain).   digoxin 0.125 MG tablet Commonly known as: LANOXIN TAKE 1/2 TABLET(0.063 MG) BY MOUTH DAILY What changed: See the new instructions.   dronedarone 400 MG tablet Commonly known as: MULTAQ Take 1 tablet (400 mg total) by mouth 2 (two) times daily with a meal.   HYDROcodone Bitartrate ER 15 MG Cp12 Take 15 mg by mouth 2 (two) times daily.   metoprolol succinate 25 MG 24 hr tablet Commonly known as: TOPROL-XL TAKE 1 TABLET(25 MG) BY MOUTH DAILY What changed: See the new instructions.   ondansetron 4 MG tablet Commonly known as: Zofran Take 1 tablet (4 mg total) by mouth  every 8 (eight) hours as needed for nausea or vomiting.   oxyCODONE 5 MG/5ML solution Commonly known as: ROXICODONE Take 2.5 mg by mouth 2 (two) times daily as needed for breakthrough pain.   pantoprazole 40 MG tablet Commonly known as: Protonix Take 1 tablet (40 mg total) by mouth daily.   polyethylene glycol 17 g packet Commonly known as: MIRALAX / GLYCOLAX Take 17 g by mouth at bedtime. Hold for loose stool   pregabalin 25 MG capsule Commonly known as: LYRICA Take 25 mg by mouth 3 (three) times daily.   PRESERVISION AREDS 2 PO Take 1 capsule by mouth 2 (two) times daily.   Savaysa 30 MG Tabs tablet Generic drug: edoxaban TAKE 1 TABLET(30 MG) BY MOUTH DAILY What changed: See the new instructions.   shark liver oil-cocoa butter 0.25-3-85.5 % suppository Commonly known as: PREPARATION H Place 1 suppository rectally as needed for hemorrhoids.               Durable Medical Equipment  (From admission, onward)  Start     Ordered   09/09/21 1334  For home use only DME oxygen  Once       Question Answer Comment  Length of Need 12 Months   Mode or (Route) Nasal cannula   Liters per Minute 2   Frequency Continuous (stationary and portable oxygen unit needed)   Oxygen conserving device Yes   Oxygen delivery system Gas      09/09/21 1333   09/09/21 1307  For home use only DME 4 wheeled rolling walker with seat  Once       Question:  Patient needs a walker to treat with the following condition  Answer:  Weakness   09/09/21 1306            Follow-up Information     Wilfrid Lund, PA. Schedule an appointment as soon as possible for a visit in 2 week(s).   Specialty: Family Medicine Why: for hospital follow-up Contact information: [address] [place] [address] [phone]         [name], MD. Schedule an appointment as soon as possible for a visit in 2 week(s).   Specialty: Cardiology Why: for hospital  follow-up Contact information: [address] [place] [address] [phone]         [facility] Follow up.   Specialty: Home Health Services Why: Your home health has been set up. The office will contact you with start of care information. If you have any questions please call the number listed above. Contact information: [address] [place] [address] [phone]                Discharge Exam: Filed Weights   09/05/21 0946  Weight: 56.7 kg   S: Had good BMs yesterday, tolerating soft diet.  Heart rate better controlled today  Physical Exam General: Alert and oriented x 3, NAD Cardiovascular: S1 S2 clear, RRR. No pedal edema b/l Respiratory: CTAB, no wheezing, rales or rhonchi Gastrointestinal: Soft, nontender, nondistended, NBS Ext: no pedal edema bilaterally Neuro: no new deficits   Condition at discharge: good  The results of significant diagnostics from this hospitalization (including imaging, microbiology, ancillary and laboratory) are listed below for reference.   Imaging Studies: CT Abdomen Pelvis W Contrast  Addendum Date: 09/05/2021   ADDENDUM REPORT: 09/05/2021 14:23 ADDENDUM: Following should be added to the impression. There are small high density foci in the dependent portion of gallbladder suggesting gallbladder stones. There are no imaging signs of acute cholecystitis. Electronically Signed   By: Ernie Avena M.D.   On: 09/05/2021 14:23   Result Date: 09/05/2021 CLINICAL DATA:  Abdominal pain EXAM: CT ABDOMEN AND PELVIS WITH CONTRAST TECHNIQUE: Multidetector CT imaging of the abdomen and pelvis was performed using the standard protocol following bolus administration of intravenous contrast. RADIATION DOSE REDUCTION: This exam was performed according to the departmental dose-optimization program which includes automated exposure control, adjustment of the mA and/or kV according to patient size and/or use  of iterative reconstruction technique. CONTRAST:  OMNIPAQUE IOHEXOL 300 MG/ML  SOLN COMPARISON:  None. FINDINGS: Lower chest: There are patchy infiltrates in the posterior lower lung fields, more so on the left side. There is large fixed hiatal hernia. Air-fluid level is seen within the hiatal hernia. Hepatobiliary: There is fatty infiltration in the liver. Gallbladder is distended without significant wall thickening. There are few small high density foci in the dependent portion of gallbladder, possibly gallbladder  stones. Pancreas: No focal abnormality is seen. Spleen: Unremarkable. Adrenals/Urinary Tract: Adrenals are unremarkable. There are no renal or ureteral stones. There are few left renal cysts largest in the upper pole measuring 4.4 cm. Stomach/Bowel: There is abnormal dilation of proximal small bowel loops measuring up to 3.9 cm in diameter. Distal small bowel loops are decompressed. Exact level of transition is not clearly identified. Appendix is not distinctly seen. There is no focal pericecal inflammation. There is no significant wall thickening in the colon. Vascular/Lymphatic: There are scattered arterial calcifications. Reproductive: Unremarkable. Other: There is no ascites or pneumoperitoneum. Musculoskeletal: There is marked decrease in height of body T12 vertebra. There is retropulsion of upper endplate causing extrinsic pressure over the ventral margin of thecal sac. There is mild to moderate compression of body of L2 vertebra. IMPRESSION: There is abnormal dilation of proximal small bowel loops with decompression of distal small bowel loops suggesting partial small bowel obstruction. Exact level of transition is not clearly identified. Small-bowel obstruction may be caused by internal hernia or adhesions. There is no hydronephrosis. There is no ascites or pneumoperitoneum. Large fixed hiatal hernia. Severe compression fracture in the body T12 vertebra and mild to moderate compression of  body of L2 vertebra. These may be recent or old. Please correlate with clinical history and physical examination findings. Small patchy infiltrates are seen in the lower lung fields, more so on the left side suggesting atelectasis/pneumonia. Other findings as described in the body of the report. Electronically Signed: By: Ernie Avena M.D. On: 09/05/2021 13:50   DG Chest Port 1 View  Result Date: 09/05/2021 CLINICAL DATA:  Questionable sepsis.  Evaluate for abnormality. EXAM: PORTABLE CHEST 1 VIEW COMPARISON:  AP chest 09/24/2020 FINDINGS: Cardiac silhouette is again moderately enlarged. Mediastinal contours are grossly within normal limits with moderate calcification within the aortic arch. Mildly decreased lung volumes although improved from prior. Left-greater-than-right linear basilar opacities. Additional likely mild left basilar heterogeneous opacity. No definite pleural effusion. No pneumothorax. Moderate multilevel degenerative disc changes of the thoracic spine. IMPRESSION:: IMPRESSION: 1. Stable moderate cardiomegaly. 2. Low lung volumes with left-greater-than-right basilar opacities. This may represent atelectasis, although left basilar pneumonia is possible. Electronically Signed   By: Neita Garnet M.D.   On: 09/05/2021 11:08   DG Abd Portable 1V  Result Date: 09/07/2021 CLINICAL DATA:  Small-bowel obstruction follow-up. EXAM: PORTABLE ABDOMEN - 1 VIEW COMPARISON:  09/06/2021 and prior studies FINDINGS: Decreased caliber of gas-filled small bowel loops within the LEFT LOWER abdomen now noted. No distended bowel loops are present. Other nondistended gas-filled loops of small bowel noted with gas in the colon again noted. IMPRESSION: Decreased caliber of gas-filled small bowel loops in the LEFT LOWER abdomen. No dilated small bowel loops now identified. Electronically Signed   By: Harmon Pier M.D.   On: 09/07/2021 10:01   DG Abd Portable 1V  Result Date: 09/06/2021 CLINICAL DATA:   Small-bowel obstruction, follow-up. Denies abdominal pain. EXAM: PORTABLE ABDOMEN - 1 VIEW COMPARISON:  CT examination dated September 05, 2021 FINDINGS: There are distended small bowel loops in the left lower quadrant which may represent obstruction or ileus. Advanced thoracolumbar spondylosis. Excreted contrast in the urinary bladder. No acute osseous abnormality. IMPRESSION: Distended small bowel loops in the left lower abdominal quadrant, which may represent ileus or obstruction. Clinical correlation is suggested. Electronically Signed   By: Larose Hires D.O.   On: 09/06/2021 08:01    Microbiology: Results for orders placed or performed during the hospital encounter  of 09/05/21  Urine Culture     Status: None   Collection Time: 09/05/21  9:50 AM   Specimen: In/Out Cath Urine  Result Value Ref Range Status   Specimen Description IN/OUT CATH URINE  Final   Special Requests NONE  Final   Culture   Final    NO GROWTH Performed at [facility], [address] [place], [place] [address]    Report Status 09/06/2021 FINAL  Final  Resp Panel by RT-PCR (Flu A&B, Covid) Nasopharyngeal Swab     Status: None   Collection Time: 09/05/21  9:51 AM   Specimen: Nasopharyngeal Swab; Nasopharyngeal(NP) swabs in vial transport medium  Result Value Ref Range Status   SARS Coronavirus 2 by RT PCR NEGATIVE NEGATIVE Final    Comment: (NOTE) SARS-CoV-2 target nucleic acids are NOT DETECTED.  The SARS-CoV-2 RNA is generally detectable in upper respiratory specimens during the acute phase of infection. The lowest concentration of SARS-CoV-2 viral copies this assay can detect is 138 copies/mL. A negative result does not preclude SARS-Cov-2 infection and should not be used as the sole basis for treatment or other patient management decisions. A negative result may occur with  improper specimen collection/handling, submission of specimen other than nasopharyngeal swab, presence of viral mutation(s) within  the areas targeted by this assay, and inadequate number of viral copies(<138 copies/mL). A negative result must be combined with clinical observations, patient history, and epidemiological information. The expected result is Negative.  Fact Sheet for Patients:  BloggerCourse.comhttps://www.fda.gov/media/152166/download  Fact Sheet for Healthcare Providers:  SeriousBroker.ithttps://www.fda.gov/media/152162/download  This test is no t yet approved or cleared by the [place] FDA and  has been authorized for detection and/or diagnosis of SARS-CoV-2 by FDA under an Emergency Use Authorization (EUA). This EUA will remain  in effect (meaning this test can be used) for the duration of the COVID-19 declaration under Section 564(b)(1) of the Act, 21 U.S.C.section 360bbb-3(b)(1), unless the authorization is terminated  or revoked sooner.       Influenza A by PCR NEGATIVE NEGATIVE Final   Influenza B by PCR NEGATIVE NEGATIVE Final    Comment: (NOTE) The Xpert Xpress SARS-CoV-2/FLU/RSV plus assay is intended as an aid in the diagnosis of influenza from Nasopharyngeal swab specimens and should not be used as a sole basis for treatment. Nasal washings and aspirates are unacceptable for Xpert Xpress SARS-CoV-2/FLU/RSV testing.  Fact Sheet for Patients: BloggerCourse.comhttps://www.fda.gov/media/152166/download  Fact Sheet for Healthcare Providers: SeriousBroker.ithttps://www.fda.gov/media/152162/download  This test is not yet approved or cleared by the [place] FDA and has been authorized for detection and/or diagnosis of SARS-CoV-2 by FDA under an Emergency Use Authorization (EUA). This EUA will remain in effect (meaning this test can be used) for the duration of the COVID-19 declaration under Section 564(b)(1) of the Act, 21 U.S.C. section 360bbb-3(b)(1), unless the authorization is terminated or revoked.  Performed at [facility], [address] [place], [place] [address]   Blood Culture (routine x 2)     Status: None  (Preliminary result)   Collection Time: 09/05/21 10:06 AM   Specimen: Right Antecubital; Blood  Result Value Ref Range Status   Specimen Description RIGHT ANTECUBITAL  Final   Special Requests   Final    BOTTLES DRAWN AEROBIC AND ANAEROBIC Blood Culture results may not be optimal due to an inadequate volume of blood received in culture bottles   Culture   Final    NO GROWTH 4 DAYS Performed at [facility],  [address] [place], [place] [address]    Report Status PENDING  Incomplete  Blood Culture (routine x 2)     Status: None (Preliminary result)   Collection Time: 09/05/21 10:30 AM   Specimen: BLOOD LEFT HAND  Result Value Ref Range Status   Specimen Description BLOOD LEFT HAND  Final   Special Requests   Final    BOTTLES DRAWN AEROBIC AND ANAEROBIC Blood Culture results may not be optimal due to an inadequate volume of blood received in culture bottles   Culture   Final    NO GROWTH 4 DAYS Performed at [facility], [address], [place], [place] [address]    Report Status PENDING  Incomplete  MRSA Next Gen by PCR, Nasal     Status: None   Collection Time: 09/05/21  6:00 PM   Specimen: Nasal Mucosa; Nasal Swab  Result Value Ref Range Status   MRSA by PCR Next Gen NOT DETECTED NOT DETECTED Final    Comment: (NOTE) The GeneXpert MRSA Assay (FDA approved for NASAL specimens only), is one component of a comprehensive MRSA colonization surveillance program. It is not intended to diagnose MRSA infection nor to guide or monitor treatment for MRSA infections. Test performance is not FDA approved in patients less than 4 years old. Performed at [facility], [address] [place], [place] [address]     Labs: CBC: Recent Labs  Lab 09/05/21 [phone] 09/05/21 1037 09/06/21 0118 09/07/21 0046 09/07/21 2337 09/09/21 0047  WBC 10.1  --  6.2 5.2 4.8 4.7  NEUTROABS 8.4*  --   --   --   --   --   HGB 15.9* 16.7* 12.4 11.6* 12.0 11.3*  HCT 50.5* 49.0* 39.2  37.0 37.3 35.9*  MCV 92.0  --  92.0 91.4 91.2 91.3  PLT 357  --  267 250 283 256   Basic Metabolic Panel: Recent Labs  Lab 09/05/21 0956 09/05/21 1037 09/05/21 1835 09/06/21 0118 09/07/21 0046 09/07/21 2337 09/09/21 0047  NA 139 139  --  140 139 140 140  K 3.7 3.9  --  4.1 3.2* 2.9* 3.2*  CL 98 105  --  107 106 105 105  CO2 27  --   --  GLUCOSE 171* 156*  --  118* 90 125* 123*  BUN 10 14  --  12 9 <5* <5*  CREATININE 0.84 0.60  --  0.65 0.52 0.54 0.55  CALCIUM 10.4*  --   --  8.6* 7.8* 7.9* 7.9*  MG 2.1  --  1.9  --  2.0  --   --    Liver Function Tests: Recent Labs  Lab 09/05/21 0956  AST 22  ALT 12  ALKPHOS 48  BILITOT 1.2  PROT 7.4  ALBUMIN 4.3   CBG: Recent Labs  Lab 09/06/21 0158  GLUCAP 110*    Discharge time spent: greater than 30 minutes.  Signed: Thad Ranger, MD [facility] 09/09/2021

## 2021-09-09 NOTE — Care Management Important Message (Signed)
Important Message ? ?Patient Details  ?Name: Tammy Walker ?MRN: [id number] ?Date of Birth: 1930/12/16 ? ? ?Medicare Important Message Given:  Yes ? ? ? ? ?[name] ?09/09/2021, 3:26 PM ?

## 2021-09-09 NOTE — Plan of Care (Signed)

## 2021-09-09 NOTE — Progress Notes (Signed)
Physical Therapy Treatment ?Patient Details ?Name: Tammy Walker ?MRN: [id number] ?DOB: 09/07/1930 ?Today's Date: 09/09/2021 ? ? ?History of Present Illness Pt is a 88 y.o. F who presents with abdominal pain and shortness of breath. CT abdomen with possible partial SBO. Repeat abdominal x-ray shows distended small bowel loops in the LLQ ileus or obstruction. Surgery consulted and recommending conservative management. Significant PMH: HTN, HLD, a-fib. ? ?  ?PT Comments  ? ? Pt making excellent progress towards her physical therapy goals; demonstrates improved activity tolerance. Pt ambulating 200 feet with a Rollator at a min guard assist level, SpO2 93-96% on RA, HR 75-118 bpm. Education provided to pt/pt daughter regarding activity recommendations and progression. D/c plan remains appropriate.  ?   ?Recommendations for follow up therapy are one component of a multi-disciplinary discharge planning process, led by the attending physician.  Recommendations may be updated based on patient status, additional functional criteria and insurance authorization. ? ?Follow Up Recommendations ? Home health PT ?  ?  ?Assistance Recommended at Discharge PRN  ?Patient can return home with the following A little help with walking and/or transfers;A little help with bathing/dressing/bathroom;Assistance with cooking/housework;Assist for transportation ?  ?Equipment Recommendations ? Rollator (4 wheels)  ?  ?Recommendations for Other Services   ? ? ?  ?Precautions / Restrictions Precautions ?Precautions: Fall ?Restrictions ?Weight Bearing Restrictions: No  ?  ? ?Mobility ? Bed Mobility ?Overal bed mobility: Modified Independent ?  ?  ?  ?  ?  ?  ?  ?  ? ?Transfers ?Overall transfer level: Needs assistance ?Equipment used: Rollator (4 wheels), None ?Transfers: Sit to/from Stand, Bed to chair/wheelchair/BSC ?Sit to Stand: Min guard ?Stand pivot transfers: Min guard ?  ?  ?  ?  ?General transfer comment: Min guard for safety, transferring  to and from [facility] without AD ?  ? ?Ambulation/Gait ?Ambulation/Gait assistance: Min guard ?Gait Distance (Feet): 200 Feet ?Assistive device: Rollator (4 wheels) ?Gait Pattern/deviations: Step-through pattern, Decreased stride length, Trunk flexed ?Gait velocity: decreased ?  ?  ?General Gait Details: Slow and steady pace ? ? ?Stairs ?  ?  ?  ?  ?  ? ? ?Wheelchair Mobility ?  ? ?Modified Rankin (Stroke Patients Only) ?  ? ? ?  ?Balance Overall balance assessment: Needs assistance ?Sitting-balance support: Feet supported ?Sitting balance-Leahy Scale: Good ?  ?  ?Standing balance support: No upper extremity supported, During functional activity ?Standing balance-Leahy Scale: Fair ?  ?  ?  ?  ?  ?  ?  ?  ?  ?  ?  ?  ?  ? ?  ?Cognition Arousal/Alertness: Awake/alert ?Behavior During Therapy: [facility] for tasks assessed/performed ?Overall Cognitive Status: Within Functional Limits for tasks assessed ?  ?  ?  ?  ?  ?  ?  ?  ?  ?  ?  ?  ?  ?  ?  ?  ?  ?  ?  ? ?  ?Exercises   ? ?  ?General Comments   ?  ?  ? ?Pertinent Vitals/Pain Pain Assessment ?Pain Assessment: Faces ?Faces Pain Scale: No hurt  ? ? ?Home Living   ?  ?  ?  ?  ?  ?  ?  ?  ?  ?   ?  ?Prior Function    ?  ?  ?   ? ?PT Goals (current goals can now be found in the care plan section) Acute Rehab PT Goals ?Patient Stated Goal: go home ?  Potential to Achieve Goals: Good ?Progress towards PT goals: Progressing toward goals ? ?  ?Frequency ? ? ? Min 3X/week ? ? ? ?  ?PT Plan Current plan remains appropriate  ? ? ?Co-evaluation   ?  ?  ?  ?  ? ?  ?AM-PAC PT "6 Clicks" Mobility   ?Outcome Measure ? Help needed turning from your back to your side while in a flat bed without using bedrails?: None ?Help needed moving from lying on your back to sitting on the side of a flat bed without using bedrails?: None ?Help needed moving to and from a bed to a chair (including a wheelchair)?: A Little ?Help needed standing up from a chair using your arms (e.g., wheelchair or bedside  chair)?: A Little ?Help needed to walk in hospital room?: A Little ?Help needed climbing 3-5 steps with a railing? : A Lot ?6 Click Score: 19 ? ?  ?End of Session   ?Activity Tolerance: Patient tolerated treatment well ?Patient left: in bed;with call bell/phone within reach;with family/visitor present ?Nurse Communication: Mobility status ?PT Visit Diagnosis: Unsteadiness on feet (R26.81);Muscle weakness (generalized) (M62.81);Difficulty in walking, not elsewhere classified (R26.2) ?  ? ? ?Time: [id number] ?PT Time Calculation (min) (ACUTE ONLY): 39 min ? ?Charges:  $Therapeutic Activity: 38-52 mins          ?          ? ?Wyona Almas, PT, DPT ?Acute Rehabilitation Services ?Pager [phone] ?Office [phone] ? ? ? ?Carloine Margo Aye ?09/09/2021, 12:25 PM ? ?

## 2021-09-09 NOTE — Progress Notes (Signed)
Patient ID: Tammy Walker, female   DOB: January 04, 1931, 88 y.o.   MRN: [id number] ?[facility] ?Progress Note ? ?   ?Subjective: ?CC-  ?Daughter at bedside. No abdominal complaints today. Denies pain, bloating, nausea, vomiting. Passing flatus and had multiple loose Bms yesterday.  ? ?Objective: ?Vital signs in last 24 hours: ?Temp:  [97.4 ?F (36.3 ?C)-98.2 ?F (36.8 ?C)] 97.4 ?F (36.3 ?C) (03/13 0456) ?Pulse Rate:  [67-97] 84 (03/13 0819) ?Resp:  [16-23] 16 (03/13 0456) ?BP: (111-139)/(61-79) 116/61 (03/13 [id number]) ?SpO2:  [95 %-100 %] 100 % (03/13 0456) ?Last BM Date : 09/08/21 ? ?Intake/Output from previous day: ?03/12 0701 - 03/13 0700 ?In: 1866.6 [P.O.:1100; I.V.:366.6; IV Piggyback:400] ?Out: 400 [Urine:400] ?Intake/Output this shift: ?No intake/output data recorded. ? ?PE: ?Gen:  Alert, NAD, pleasant ?Abd: soft, protuberant (baseline per patient), nontender ? ?Lab Results:  ?Recent Labs  ?  09/07/21 ?2337 09/09/21 ?[id number]  ?WBC 4.8 4.7  ?HGB 12.0 11.3*  ?HCT 37.3 35.9*  ?PLT 283 256  ? ?BMET ?Recent Labs  ?  09/07/21 ?2337 09/09/21 ?[id number]  ?NA 140 140  ?K 2.9* 3.2*  ?CL 105 105  ?CO2 28 27  ?GLUCOSE 125* 123*  ?BUN <5* <5*  ?CREATININE 0.54 0.55  ?CALCIUM 7.9* 7.9*  ? ?PT/INR ?No results for input(s): LABPROT, INR in the last 72 hours. ?CMP  ?   ?Component Value Date/Time  ? NA 140 09/09/2021 0047  ? K 3.2 (L) 09/09/2021 0047  ? CL 105 09/09/2021 0047  ? CO2 27 09/09/2021 0047  ? GLUCOSE 123 (H) 09/09/2021 0047  ? BUN <5 (L) 09/09/2021 0047  ? CREATININE 0.55 09/09/2021 0047  ? CREATININE 0.85 08/15/2019 0827  ? CALCIUM 7.9 (L) 09/09/2021 0047  ? PROT 7.4 09/05/2021 0956  ? ALBUMIN 4.3 09/05/2021 0956  ? AST 22 09/05/2021 0956  ? ALT 12 09/05/2021 0956  ? ALKPHOS 48 09/05/2021 0956  ? BILITOT 1.2 09/05/2021 0956  ? GFRNONAA >60 09/09/2021 0047  ? GFRNONAA 62 08/15/2019 0827  ? GFRAA 72 08/15/2019 0827  ? ?Lipase  ?   ?Component Value Date/Time  ? LIPASE 52 (H) 09/05/2021 0956   ? ? ? ? ? ?Studies/Results: ?No results found. ? ?Anti-infectives: ?Anti-infectives (From admission, onward)  ? ? Start     Dose/Rate Route Frequency Ordered Stop  ? 09/06/21 0900  Ampicillin-Sulbactam (UNASYN) 3 g in sodium chloride 0.9 % 100 mL IVPB       ? 3 g ?200 mL/hr over 30 Minutes Intravenous Every 8 hours 09/06/21 0856    ? 09/06/21 0830  piperacillin-tazobactam (ZOSYN) IVPB 3.375 g  Status:  Discontinued       ? 3.375 g ?12.5 mL/hr over 240 Minutes Intravenous Every 8 hours 09/06/21 0825 09/06/21 0856  ? ?  ? ? ? ?Assessment/Plan ?SBO ?- Resolving. Tolerating diet and having bowel function. Continue soft diet and home miralax/colace. Anusol suppository for hemorrhoids. ?[place] for discharge from surgical standpoint. We will sign off, please call with questions or concerns.  ?  ?FEN: Soft. IVF per [facility] ?VTE: SCDs, edoxaban ?ID: Unasyn (PNA). None currently indicated from our standpoint ?  ?- Per [facility] -  ?Aspiration PNA? - On o2. On abx per [facility] ?T12 and L2 chronic compression fractures  ?A fib on Edoxaban  ?HTN ?HLD ?GERD ?Osteoporosis  ? ?I reviewed hospitalist notes, last 24 h vitals and pain scores, and last 48 h intake and output. ? ? ? ? LOS: 4 days  ? ? ?Reinhart Saulters A  Fateh Kindle, PA-C ?[facility] ?09/09/2021, 8:57 AM ?Please see Amion for pager number during day hours 7:00am-4:30pm ? ?

## 2021-09-09 NOTE — Progress Notes (Signed)
Occupational Therapy Treatment ?Patient Details ?Name: Tammy Walker ?MRN: [id number] ?DOB: Jun 11, 1931 ?Today's Date: 09/09/2021 ? ? ?History of present illness Pt is a 88 y.o. F who presents with abdominal pain and shortness of breath. CT abdomen with possible partial SBO. Repeat abdominal x-ray shows distended small bowel loops in the LLQ ileus or obstruction. Surgery consulted and recommending conservative management. Significant PMH: HTN, HLD, a-fib. ?  ?OT comments ? Pt progressing towards acute OT goals. Focus of session was energy conservation education for managing ADLs at home. Of note, pt on 1L O2 throughout session and consistently noted drop in SpO2 to mid/upper 80s on RA. Nursing and attending MD notified. D/c recommendation remains appropriate.  ?  ? ?Recommendations for follow up therapy are one component of a multi-disciplinary discharge planning process, led by the attending physician.  Recommendations may be updated based on patient status, additional functional criteria and insurance authorization. ?   ?Follow Up Recommendations ? No OT follow up  ?  ?Assistance Recommended at Discharge Intermittent Supervision/Assistance  ?Patient can return home with the following ? Assistance with cooking/housework;Assist for transportation;Help with stairs or ramp for entrance;A little help with bathing/dressing/bathroom ?  ?Equipment Recommendations ? Tub/shower seat  ?  ?Recommendations for Other Services   ? ?  ?Precautions / Restrictions Precautions ?Precautions: Fall ?Precaution Comments: watch HR/O2 ?Restrictions ?Weight Bearing Restrictions: No  ? ? ?  ? ?Mobility Bed Mobility ?Overal bed mobility: Modified Independent ?  ?  ?  ?  ?  ?  ?  ?  ? ?Transfers ?Overall transfer level: Needs assistance ?Equipment used: None ?Transfers: Sit to/from Stand ?Sit to Stand: Min guard ?  ?  ?  ?  ?  ?General transfer comment: to/from EOB and BSC ?  ?  ?Balance Overall balance assessment: Needs  assistance ?Sitting-balance support: Feet supported ?Sitting balance-Leahy Scale: Good ?  ?  ?Standing balance support: No upper extremity supported, During functional activity ?Standing balance-Leahy Scale: Fair ?  ?  ?  ?  ?  ?  ?  ?  ?  ?  ?  ?  ?   ? ?ADL either performed or assessed with clinical judgement  ? ?ADL Overall ADL's : Needs assistance/impaired ?  ?  ?Grooming: Wash/dry hands;Wash/dry face;Oral care;Supervision/safety;Standing ?  ?  ?  ?  ?  ?  ?  ?  ?  ?Toilet Transfer: Min guard;Ambulation;BSC/3in1 ?  ?Toileting- Architect and Hygiene: Min guard ?  ?  ?  ?Functional mobility during ADLs: Min guard ?General ADL Comments: Energy conservation education provided for managing ADL routine at home ?  ? ?Extremity/Trunk Assessment Upper Extremity Assessment ?Upper Extremity Assessment: Generalized weakness ?  ?Lower Extremity Assessment ?Lower Extremity Assessment: Defer to PT evaluation ?  ?  ?  ? ?Vision   ?  ?  ?Perception   ?  ?Praxis   ?  ? ?Cognition Arousal/Alertness: Awake/alert ?Behavior During Therapy: [facility] for tasks assessed/performed ?Overall Cognitive Status: Within Functional Limits for tasks assessed ?  ?  ?  ?  ?  ?  ?  ?  ?  ?  ?  ?  ?  ?  ?  ?  ?  ?  ?  ?   ?Exercises   ? ?  ?Shoulder Instructions   ? ? ?  ?General Comments    ? ? ?Pertinent Vitals/ Pain       Pain Assessment ?Pain Assessment: Faces ?Faces Pain Scale: Hurts a little bit ?Pain  Location: chronic back pain ?Pain Descriptors / Indicators: Aching ?Pain Intervention(s): Monitored during session, Repositioned, Limited activity within patient's tolerance ? ?Home Living   ?  ?  ?  ?  ?  ?  ?  ?  ?  ?  ?  ?  ?  ?  ?  ?  ?  ?  ? ?  ?Prior Functioning/Environment    ?  ?  ?  ?   ? ?Frequency ? Min 2X/week  ? ? ? ? ?  ?Progress Toward Goals ? ?OT Goals(current goals can now be found in the care plan section) ? Progress towards OT goals: Progressing toward goals ? ?Acute Rehab OT Goals ?Patient Stated Goal: to feel  better ?OT Goal Formulation: With patient ?Time For Goal Achievement: 09/22/21 ?Potential to Achieve Goals: Good ?ADL Goals ?Pt Will Perform Grooming: with supervision;standing ?Pt Will Perform Lower Body Bathing: with supervision;sit to/from stand ?Pt Will Perform Lower Body Dressing: with supervision;sit to/from stand ?Pt Will Transfer to Toilet: with supervision;ambulating;regular height toilet;bedside commode;grab bars ?Pt Will Perform Toileting - Clothing Manipulation and hygiene: with supervision;sit to/from stand  ?Plan Discharge plan remains appropriate   ? ?Co-evaluation ? ? ?   ?  ?  ?  ?  ? ?  ?AM-PAC OT "6 Clicks" Daily Activity     ?Outcome Measure ? ? Help from another person eating meals?: None ?Help from another person taking care of personal grooming?: A Little ?Help from another person toileting, which includes using toliet, bedpan, or urinal?: A Little ?Help from another person bathing (including washing, rinsing, drying)?: A Little ?Help from another person to put on and taking off regular upper body clothing?: A Little ?Help from another person to put on and taking off regular lower body clothing?: A Little ?6 Click Score: 19 ? ?  ?End of Session Equipment Utilized During Treatment: Oxygen (1L/min) ? ?OT Visit Diagnosis: Unsteadiness on feet (R26.81) ?  ?Activity Tolerance Patient limited by fatigue ?  ?Patient Left in bed;with call bell/phone within reach ?  ?Nurse Communication Other (comment) (SpO2 dropped to mid/upper 80s on RA) ?  ? ?   ? ?Time: 1206-1230 ?OT Time Calculation (min): 24 min ? ?Charges: OT General Charges ?$OT Visit: 1 Visit ?OT Treatments ?$Self Care/Home Management : 23-37 mins ? ?Raynald Kemp, OT ?Acute Rehabilitation Services ?Office: [phone] ? ? ?Raynald Kemp H ?09/09/2021, 2:25 PM ?

## 2021-09-09 NOTE — Progress Notes (Signed)
ANTICOAGULATION CONSULT NOTE  ? ?Pharmacy Consult for heparin ?Indication: atrial fibrillation ? ?No Known Allergies ? ?Patient Measurements: ?Height: 5\' 4"  (162.6 cm) ?Weight: 56.7 kg (125 lb) ?IBW/kg (Calculated) : 54.7 ?Heparin Dosing Weight: 56.7 kg ? ?Vital Signs: ?Temp: 98.1 ?F (36.7 ?C) (03/13 0010) ?Temp Source: Oral (03/13 0010) ?BP: 116/61 (03/13 0010) ?Pulse Rate: 90 (03/13 0010) ? ?Labs: ?Recent Labs  ?  09/06/21 ?0118 09/06/21 ?1303 09/07/21 ?[id number] 09/07/21 ?1156 09/07/21 ?2337 09/09/21 ?[id number]  ?HGB 12.4  --  11.6*  --  12.0 11.3*  ?HCT 39.2  --  37.0  --  37.3 35.9*  ?PLT 267  --  250  --  283 256  ?APTT 70* 86*  --  77*  --   --   ?HEPARINUNFRC 0.80*  --  0.39 0.39 0.39 0.18*  ?CREATININE 0.65  --  0.52  --  0.54 0.55  ? ? ? ?Estimated Creatinine Clearance: 42 mL/min (by C-G formula based on SCr of 0.55 mg/dL). ? ? ?Medical History: ?Past Medical History:  ?Diagnosis Date  ? Allergy   ? Arrhythmia   ? Atrial fibrillation ([place])   ? GERD (gastroesophageal reflux disease)   ? History of bone scan   ? Bone Density Scans 2017 & 2019 Dr. Trenton Gammon   ? History of chest x-ray   ? Apart from large hiatus hernia, normal heart, lungs and mediastinum. [facility]    ? History of CT scan of abdomen 10/24/2017  ? Gallstones within a thin-waled gallbladder, Per records from [facility]   ? History of echocardiogram   ? [facility] &  [facility]   ? History of EKG   ? Sinus rhythm, borderline 1st deg block, LAD completely changed axis, LBBB(old). Per records from [facility]   ? Hyperlipidemia   ? Hypertension   ? Insomnia   ? Left lower lobe pneumonia 09/20/2015  ? Per records from [facility]   ? Nodule of right lung   ? Per records from [facility],   ? Osteoporosis   ? on fosamax  ? Osteoporosis   ? Reduced mobility   ?  ? ?Assessment: ?88 yo W on edoxaban PTA for afib now with partial SBO and holding edoxaban.  Last dose was 3/8@ 20:00  Pharmacy consulted for heparin.   ? ?Heparin level today therapeutic at 0.39. Hemoglobin stable at 12.0 and platelets stable at ~250. No issues with bleeding noted per RN.  ? ?Discussed with surgery. Want to ensure tolerates soft diet today. If does ok can likely switch back to PO anticoagulation 3/13.  ? ?3/13 AM update: ?Heparin level low ? ?Goal of Therapy:  ?Heparin level 0.3-0.7 units/ml ?Monitor platelets by anticoagulation protocol: Yes ?  ?Plan:   ?Inc heparin to 900 units/hr ?1100 heparin level ? ?Narda Bonds, PharmD, BCPS ?Clinical Pharmacist ?Phone: [phone] ? ? ?

## 2021-09-09 NOTE — Progress Notes (Signed)
SATURATION QUALIFICATIONS: (This note is used to comply with regulatory documentation for home oxygen) ? ?Patient Saturations on Room Air at Rest = 92% ? ?Patient Saturations on Room Air while Ambulating = 87% ? ?Patient Saturations on 2Liters of oxygen while Ambulating = 84% ? ?Please briefly explain why patient needs home oxygen: Pt desats into mid-upper 80s with activity ?

## 2021-09-09 NOTE — Progress Notes (Signed)
Received message from RN that central tele alerted her that monitor showed ST-V 5.5> and ST-MCL>2.0. Telemetry difficult to evaluate due to patient's known LBBB. Patient denied any chest pain, palpitations, SOB, or dizziness. Ordered EKG which showed atrial fibrillation with a HR of 85, LBBB.  ? ?LBBB has been present on EKG for approx 10 years, atrial fibrillation is well controlled. OK to discharge  ? ? ?

## 2021-09-09 NOTE — Consult Note (Addendum)
Cardiology Consultation:   Patient ID: LUCKY THAKKER MRN: [id number]; DOB: [birth year]  Admit date: 09/05/2021 Date of Consult: 09/09/2021  PCP:  Tammy Huxley, PA   [facility] Providers Cardiologist:  Tammy Furbish, MD      Patient Profile:   Tammy Walker is a 88 y.o. female with a hx of paroxysmal atrial fibrillation on Edoxaban, LBBB, aortic stenosis, chronic diastolic heart failure, HTN, HLD, GERD who is being seen 09/09/2021 for the evaluation of atrial fibrillation at the request of Dr. Tana Walker.  History of Present Illness:   [name] is an 88 year old female with above medical history who is followed by Dr. Marlou Walker. Patient was first noted to have an episode of atrial fibrillation in 2002, underwent successful cardioversion, and was started on Coumadin and amiodarone. Her atrial fibrillation was well managed on this regiment, and she did not have any known recurrences of afib until 08/2020. Patient was switched from Coumadin to [facility] in 2020. 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  Her amiodarone was subsequently discontinued.  Patient was hospitalized in March 2022 for atrial fibrillation with RVR and chest Walker. She underwent 3 failed DCCV in the emergency room. Echocardiogram on 09/24/2020 showed LVEF 60-65%, mild LVH, severely dilated LA, mild mitral valve regurgitation.  Was seen by EP, who recommended attempting to rate control patient with low dose beta blocker and digoxin. Patient converted to sinus rhythm on this regiment and was discharged. Patient was last seen by cardiology on 04/02/2021. At that visit, patient was doing well, denied any symptoms of atrial fibrillation (palpitations, SOB, lightheadedness). Was in sinus rhythm.   Patient presented to the ED on  3/9 complaining of weakness, abdominal Walker, nausea, vomiting.  Initially had a SBP in the 90s.  EKG showed atrial fibrillation with a HR of 145, LBBB. CT angiogram showed a possible small bowel obstruction. General surgery was consulted and patient's edoxaban was held in anticipation of possible surgery. Patient started on IV heparin. HR improved on home lopressor. Patient's small bowel obstruction improved with conservative management, and patient was tolerating a soft diet diet and had normal bowel function this morning. As patient was able to tolerate oral medicine, she was transitioned from IV heparin to oral edoxaban this morning.   On interview, patient reports that she usually cannot tell if she is in afib or sinus rhythm. She denies palpitations, dizziness, SOB, chest Walker. Is doing better from a GI standpoint and is tolerating her diet/having bowel movements. She is looking forward to going home today. Patient lives with her daughter who is her primary care giver. Denies any known history of CAD, heart failure.   Past Medical History:  Diagnosis Date   Allergy    Arrhythmia    Atrial fibrillation ([place])    GERD (gastroesophageal reflux disease)    History of bone scan    Bone Density Scans 2017 & 2019 Dr. Trenton Gammon    History of chest x-ray    Apart from large hiatus hernia, normal heart, lungs and mediastinum. Per [facility] from [facility]     History of CT scan of abdomen 10/24/2017   Gallstones within a thin-waled gallbladder, Per records from [facility]    History of echocardiogram    [facility] &  [facility]    History of EKG    Sinus rhythm, borderline 1st  deg block, LAD completely changed axis, LBBB(old). Per records from [facility]    Hyperlipidemia    Hypertension    Insomnia    Left lower lobe pneumonia 09/20/2015   Per records from [facility]    Nodule of right lung    Per records from [facility],    Osteoporosis    on fosamax   Osteoporosis    Reduced mobility      Past Surgical History:  Procedure Laterality Date   APPENDECTOMY  1947   [place], [place]    CT [place]     2017, 2018, 2019 -- re lungs see records    [place], [place], [place]      Home Medications:  Prior to Admission medications   Medication Sig Start Date End Date Taking? Authorizing Provider  acetaminophen (TYLENOL) 500 MG tablet Take 500 mg by mouth 3 (three) times daily.   Yes [provider]  atorvastatin (LIPITOR) 20 MG tablet Take 20 mg by mouth at bedtime. 05/21/20  Yes [provider]  bisacodyl (DULCOLAX) 5 MG EC tablet Take 10 mg by mouth at bedtime.   Yes [provider]  Calcium-Phosphorus-Vitamin D (CALCIUM/D3 ADULT GUMMIES PO) Take 1 tablet by mouth 4 (four) times daily. Calcium 200 mg/ Vitamin D3 500 I.U. - Vitafusion   Yes [provider]  calcium-vitamin D (OSCAL WITH D) 500-200 MG-UNIT tablet Take 1 tablet by mouth 2 (two) times daily. 03/16/19  Yes Ngetich, Tammy C, NP  cetirizine (ZYRTEC) 10 MG tablet Take 10 mg by mouth every morning.   Yes [provider]  clotrimazole (LOTRIMIN) 1 % cream Apply 1 application. topically 2 (two) times daily as needed (fungus).   Yes [provider]  denosumab (PROLIA) 60 MG/ML SOSY injection Inject 60 mg into the skin every 6 (six) months.   Yes [provider]  diclofenac Sodium (VOLTAREN) 1 % GEL Apply 4 g topically 4 (four) times daily as needed (back Walker).   Yes [provider]  digoxin (LANOXIN) 0.125 MG tablet TAKE 1/2 TABLET(0.063 MG) BY MOUTH DAILY Patient taking differently: Take 0.0625 mg by mouth daily. 06/20/21  Yes Tammy Pain, MD  edoxaban (SAVAYSA) 30 MG TABS tablet TAKE 1 TABLET(30 MG) BY MOUTH DAILY Patient taking differently: Take 30 mg by mouth daily. 07/10/21  Yes Tammy Pain, MD  HYDROcodone Bitartrate ER 15 MG CP12 Take 15 mg by mouth 2 (two) times daily. 07/02/20  Yes [provider]  metoprolol  succinate (TOPROL-XL) 25 MG 24 hr tablet TAKE 1 TABLET(25 MG) BY MOUTH DAILY Patient taking differently: Take 25 mg by mouth daily. 07/03/21  Yes Tammy Pain, MD  Multiple Vitamins-Minerals (PRESERVISION AREDS 2 PO) Take 1 capsule by mouth 2 (two) times daily.   Yes [provider]  oxyCODONE (ROXICODONE) 5 MG/5ML solution Take 2.5 mg by mouth 2 (two) times daily as needed for breakthrough Walker.   Yes [provider]  polyethylene glycol (MIRALAX / GLYCOLAX) 17 g packet Take 17 g by mouth at bedtime. Hold for loose stool   Yes [provider]  pregabalin (LYRICA) 25 MG capsule Take 25 mg by mouth 3 (three) times daily.   Yes [provider]  shark liver oil-cocoa butter (PREPARATION H) 0.25-3-85.5 % suppository Place 1 suppository rectally as needed for hemorrhoids.   Yes [provider]    Inpatient Medications: Scheduled Meds:  atorvastatin  20 mg Oral  QHS   digoxin  0.0625 mg Oral Daily   docusate sodium  100 mg Oral BID   edoxaban  30 mg Oral Q24H   hydrocortisone  25 mg Rectal BID   metoprolol tartrate  25 mg Oral BID   pantoprazole (PROTONIX) IV  40 mg Intravenous Q12H   polyethylene glycol  17 g Oral Daily   Continuous Infusions:  ampicillin-sulbactam (UNASYN) IV 3 g (09/09/21 0251)   PRN Meds: acetaminophen **OR** acetaminophen, albuterol, fentaNYL (SUBLIMAZE) injection, metoprolol tartrate, ondansetron **OR** ondansetron (ZOFRAN) IV  Allergies:   No Known Allergies  Social History:   Social History   Socioeconomic History   Marital status: Widowed    Spouse name: Not on file   Number of children: Not on file   Years of education: Not on file   Highest education level: Not on file  Occupational History   Not on file  Tobacco Use   Smoking status: Never   Smokeless tobacco: Never  Vaping Use   Vaping Use: Never used  Substance and Sexual Activity   Alcohol use: Yes    Comment: less than one drink a week   Drug use: No    Sexual activity: Never    Birth control/protection: None  Other Topics Concern   Not on file  Social History Narrative   Social History      Diet? I watch my intake of salt, sugar, and fat      Do you drink/eat things with caffeine? Yes       Marital status?      Widowed                               What year were you married? 1960      Do you live in a house, apartment, assisted living, condo, trailer, etc.? House       Is it one or more stories? one      How many persons live in your home? 2      Do you have any pets in your home? (please list) yes 2 cats       Highest level of education completed? 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             yes                         Type & how often? Strength & Balance       Advanced Directives      Do you have a living will?  No       Do you have a DNR form?        No                           If not, do you want to discuss one? No       Do you have signed POA/HPOA for forms? Yes       Functional Status      Do you have difficulty bathing or dressing yourself? No       Do you have difficulty preparing food or eating? No       Do you have difficulty managing your medications? No  Do you have difficulty managing your finances? No       Do you have difficulty affording your medications? No       Social Determinants of Radio broadcast assistant Strain: Not on file  Food Insecurity: Not on file  Transportation Needs: Not on file  Physical Activity: Not on file  Stress: Not on file  Social Connections: Not on file  Intimate Partner Violence: Not on file    Family History:    Family History  Problem Relation Age of Onset   Heart disease Mother    High blood pressure Mother    COPD Mother    Heart failure Mother    Heart disease Father    Heart disease Brother    Heart failure Brother    Hyperlipidemia  Daughter    Hypertension Daughter      ROS:  Please see the history of present illness.   All other ROS reviewed and negative.     Physical Exam/Data:   Vitals:   09/09/21 0010 09/09/21 0456 09/09/21 0819 09/09/21 0857  BP: 116/61 111/79 116/61 (!) 108/59  Pulse: 90 67 84 76  Resp: 20 16  (!) 24  Temp: 98.1 F (36.7 Walker) (!) 97.4 F (36.3 Walker)  (!) 97.5 F (36.4 Walker)  TempSrc: Oral Axillary  Oral  SpO2: 99% 100%  97%  Weight:      Height:        Intake/Output Summary (Last 24 hours) at 09/09/2021 1015 Last data filed at 09/09/2021 0251 Gross per 24 hour  Intake 1566.64 ml  Output 400 ml  Net 1166.64 ml   Last 3 Weights 09/05/2021 04/02/2021 11/06/2020  Weight (lbs) 125 lb 125 lb 1.6 oz 128 lb  Weight (kg) 56.7 kg 56.745 kg 58.06 kg     Body mass index is 21.46 kg/m.  General:  Pleasant, frail elderly woman sitting comfortably in the bed HEENT: normal Neck: no JVD Cardiac: Irregular rate and rhythm, systolic murmur at RUSB Lungs: No increased WOB, no obvious wheezing or rhonchi  Abd: soft, nontender, no hepatomegaly  Ext: no edema Musculoskeletal:  No deformities, BUE and BLE strength normal and equal Skin: warm and dry  Neuro:  CNs 2-12 intact, no focal abnormalities noted Psych:  Normal affect   EKG:  The EKG was personally reviewed and demonstrates:  EKG on 3.9 showed atrial fibrillation with HR 145, LBBB Telemetry:  Telemetry was personally reviewed and demonstrates:  Atrial fibrillation, rate overall well controlled and ranges from 60s-100s   Relevant CV Studies: 1. Left ventricular ejection fraction, by estimation, is 55 to 60%. The  left ventricle has normal function. The left ventricle has no regional  wall motion abnormalities. Left ventricular diastolic parameters are  consistent with Grade II diastolic  dysfunction (pseudonormalization). The E/e' is 18.0.   2. Right ventricular systolic function is normal. The right ventricular  size is normal.   3. Left  atrial size was moderately dilated.   4. The mitral valve is degenerative. Mild mitral valve regurgitation.   5. The aortic valve is tricuspid. There is moderate calcification of the  aortic valve. There is moderate thickening of the aortic valve. Aortic  valve regurgitation is mild. Mild to moderate aortic valve  sclerosis/calcification is present, without any  evidence of aortic stenosis.   Laboratory Data:  High Sensitivity Troponin:  No results for input(s): TROPONINIHS in the last 720 hours.   Chemistry Recent Labs  Lab 09/05/21 [phone] 09/05/21 1037 09/05/21 1835  09/06/21 0118 09/07/21 0046 09/07/21 2337 09/09/21 0047  NA 139   < >  --    < > 139 140 140  K 3.7   < >  --    < > 3.2* 2.9* 3.2*  CL 98   < >  --    < > 106 105 105  CO2 27  --   --    < > 26 28 27   GLUCOSE 171*   < >  --    < > 90 125* 123*  BUN 10   < >  --    < > 9 <5* <5*  CREATININE 0.84   < >  --    < > 0.52 0.54 0.55  CALCIUM 10.4*  --   --    < > 7.8* 7.9* 7.9*  MG 2.1  --  1.9  --  2.0  --   --   GFRNONAA >60  --   --    < > >60 >60 >60  ANIONGAP 14  --   --    < > 7 7 8    < > = values in this interval not displayed.    Recent Labs  Lab 09/05/21 0956  PROT 7.4  ALBUMIN 4.3  AST 22  ALT 12  ALKPHOS 48  BILITOT 1.2   Lipids No results for input(s): CHOL, TRIG, HDL, LABVLDL, LDLCALC, CHOLHDL in the last 168 hours.  Hematology Recent Labs  Lab 09/07/21 0046 09/07/21 2337 09/09/21 0047  WBC 5.2 4.8 4.7  RBC 4.05 4.09 3.93  HGB 11.6* 12.0 11.3*  HCT 37.0 37.3 35.9*  MCV 91.4 91.2 91.3  MCH 28.6 29.3 28.8  MCHC 31.4 32.2 31.5  RDW 14.7 14.5 14.4  PLT 250 283 256   Thyroid No results for input(s): TSH, FREET4 in the last 168 hours.  BNPNo results for input(s): BNP, PROBNP in the last 168 hours.  DDimer No results for input(s): DDIMER in the last 168 hours.   Radiology/Studies:  CT Abdomen Pelvis W Contrast  Addendum Date: 09/05/2021   ADDENDUM REPORT: 09/05/2021 14:23 ADDENDUM:  Following should be added to the impression. There are small high density foci in the dependent portion of gallbladder suggesting gallbladder stones. There are no imaging signs of acute cholecystitis. Electronically Signed   By: Elmer Picker M.D.   On: 09/05/2021 14:23   Result Date: 09/05/2021 CLINICAL DATA:  Abdominal Walker EXAM: CT ABDOMEN AND PELVIS WITH CONTRAST TECHNIQUE: Multidetector CT imaging of the abdomen and pelvis was performed using the standard protocol following bolus administration of intravenous contrast. RADIATION DOSE REDUCTION: This exam was performed according to the departmental dose-optimization program which includes automated exposure control, adjustment of the mA and/or kV according to patient size and/or use of iterative reconstruction technique. CONTRAST:  123mL OMNIPAQUE IOHEXOL 300 MG/ML  SOLN COMPARISON:  None. FINDINGS: Lower chest: There are patchy infiltrates in the posterior lower lung fields, more so on the left side. There is large fixed hiatal hernia. Air-fluid level is seen within the hiatal hernia. Hepatobiliary: There is fatty infiltration in the liver. Gallbladder is distended without significant wall thickening. There are few small high density foci in the dependent portion of gallbladder, possibly gallbladder stones. Pancreas: No focal abnormality is seen. Spleen: Unremarkable. Adrenals/Urinary Tract: Adrenals are unremarkable. There are no renal or ureteral stones. There are few left renal cysts largest in the upper pole measuring 4.4 cm. Stomach/Bowel: There is abnormal dilation of proximal small bowel loops measuring  up to 3.9 cm in diameter. Distal small bowel loops are decompressed. Exact level of transition is not clearly identified. Appendix is not distinctly seen. There is no focal pericecal inflammation. There is no significant wall thickening in the colon. Vascular/Lymphatic: There are scattered arterial calcifications. Reproductive: Unremarkable. Other:  There is no ascites or pneumoperitoneum. Musculoskeletal: There is marked decrease in height of body T12 vertebra. There is retropulsion of upper endplate causing extrinsic pressure over the ventral margin of thecal sac. There is mild to moderate compression of body of L2 vertebra. IMPRESSION: There is abnormal dilation of proximal small bowel loops with decompression of distal small bowel loops suggesting partial small bowel obstruction. Exact level of transition is not clearly identified. Small-bowel obstruction may be caused by internal hernia or adhesions. There is no hydronephrosis. There is no ascites or pneumoperitoneum. Large fixed hiatal hernia. Severe compression fracture in the body T12 vertebra and mild to moderate compression of body of L2 vertebra. These may be recent or old. Please correlate with clinical history and physical examination findings. Small patchy infiltrates are seen in the lower lung fields, more so on the left side suggesting atelectasis/pneumonia. Other findings as described in the body of the report. Electronically Signed: By: Elmer Picker M.D. On: 09/05/2021 13:50   DG Chest Port 1 View  Result Date: 09/05/2021 CLINICAL DATA:  Questionable sepsis.  Evaluate for abnormality. EXAM: PORTABLE CHEST 1 VIEW COMPARISON:  AP chest 09/24/2020 FINDINGS: Cardiac silhouette is again moderately enlarged. Mediastinal contours are grossly within normal limits with moderate calcification within the aortic arch. Mildly decreased lung volumes although improved from prior. Left-greater-than-right linear basilar opacities. Additional likely mild left basilar heterogeneous opacity. No definite pleural effusion. No pneumothorax. Moderate multilevel degenerative disc changes of the thoracic spine. IMPRESSION:: IMPRESSION: 1. Stable moderate cardiomegaly. 2. Low lung volumes with left-greater-than-right basilar opacities. This may represent atelectasis, although left basilar pneumonia is  possible. Electronically Signed   By: Yvonne Kendall M.D.   On: 09/05/2021 11:08   DG Abd Portable 1V  Result Date: 09/07/2021 CLINICAL DATA:  Small-bowel obstruction follow-up. EXAM: PORTABLE ABDOMEN - 1 VIEW COMPARISON:  09/06/2021 and prior studies FINDINGS: Decreased caliber of gas-filled small bowel loops within the LEFT LOWER abdomen now noted. No distended bowel loops are present. Other nondistended gas-filled loops of small bowel noted with gas in the colon again noted. IMPRESSION: Decreased caliber of gas-filled small bowel loops in the LEFT LOWER abdomen. No dilated small bowel loops now identified. Electronically Signed   By: Margarette Canada M.D.   On: 09/07/2021 10:01   DG Abd Portable 1V  Result Date: 09/06/2021 CLINICAL DATA:  Small-bowel obstruction, follow-up. Denies abdominal Walker. EXAM: PORTABLE ABDOMEN - 1 VIEW COMPARISON:  CT examination dated September 05, 2021 FINDINGS: There are distended small bowel loops in the left lower quadrant which may represent obstruction or ileus. Advanced thoracolumbar spondylosis. Excreted contrast in the urinary bladder. No acute osseous abnormality. IMPRESSION: Distended small bowel loops in the left lower abdominal quadrant, which may represent ileus or obstruction. Clinical correlation is suggested. Electronically Signed   By: Keane Police D.O.   On: 09/06/2021 08:01     Assessment and Plan:   Paroxysmal Atrial Fibrillation on chronic anticoagulation: CHADS- VASc 5 ( HTN, gender, age x2, CHF)   When patient presented to the ED on 3/9, initial EKG showed Afib with RVR (HR 145).  Was found to have a partial SBO, so she was made NPO, and her home oral medications  were transitioned to IV. As patient improved and was able to tolerate a soft diet, she was transitioned from IV medications back to oral - Currently on her home regiment of digoxin 0.0625 mg, lopressor 25 mg daily, and edoxaban 30 mg daily. Per telemetry, HR is overall well controlled on this  regiment.  - Patient denies any symptoms of atrial fibrillation, and is unable to tell when she is in sinus rhythm vs afib  - Recent echocardiogram showed a severely dilated LA. 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 The patient's score is based upon: CHF History: 0 HTN History: 1 Diabetes History: 0 Stroke History: 0 Vascular Disease History: 0 Age Score: 2 Gender Score: 1       For questions or updates, please contact [place] Please consult [email or web address] for contact info under    Signed, Margie Billet, PA-Walker  09/09/2021 10:15 AM

## 2021-09-09 NOTE — Plan of Care (Signed)
?  Problem: Education: ?Goal: Knowledge of General Education information will improve ?Description: Including pain rating scale, medication(s)/side effects and non-pharmacologic comfort measures ?Outcome: Adequate for Discharge ?  ?Problem: Activity: ?Goal: Risk for activity intolerance will decrease ?Outcome: Adequate for Discharge ?  ?Problem: Coping: ?Goal: Level of anxiety will decrease ?Outcome: Adequate for Discharge ?  ?Problem: Pain Managment: ?Goal: General experience of comfort will improve ?Outcome: Adequate for Discharge ?  ?Problem: Safety: ?Goal: Ability to remain free from injury will improve ?Outcome: Adequate for Discharge ?  ?Problem: Skin Integrity: ?Goal: Risk for impaired skin integrity will decrease ?Outcome: Adequate for Discharge ?  ?

## 2021-09-09 NOTE — Progress Notes (Signed)
Patient and her daughter were given discharge information and stated understanding. ?

## 2021-09-09 NOTE — TOC Transition Note (Addendum)
Transition of Care (TOC) - CM/SW Discharge Note ? ? ?Patient Details  ?Name: Tammy Walker ?MRN: [id number] ?Date of Birth: [birth year] ? ?Transition of Care (TOC) CM/SW Contact:  ?Beckie Busing, RN ?Phone Number:[phone] ? ?09/09/2021, 1:23 PM ? ? ?Clinical Narrative:    ?TOC consulted for patient with [facility] and DME needs. CM provided patient and daughter with list per [email or web address]. Patient and daughter agreeable to [facility]. Referral has been accepted by Amy with [facility]) Info has been added to AVS. ? ?1339 DME orders for rollator and Home O2 have been called to [place] with [employer].Both to be delivered to the bedside.  ? ? ?  ?  ? ? ?Patient Goals and CMS Choice ?  ?  ?  ? ?Discharge Placement ?  ?           ?  ?  ?  ?  ? ?Discharge Plan and Services ?  ?  ?           ?  ?  ?  ?  ?  ?  ?  ?  ?  ?  ? ?Social Determinants of Health (SDOH) Interventions ?  ? ? ?Readmission Risk Interventions ?No flowsheet data found. ? ? ? ? ?

## 2021-09-10 ENCOUNTER — Other Ambulatory Visit (HOSPITAL_COMMUNITY): Payer: Self-pay

## 2021-09-10 LAB — CULTURE, BLOOD (ROUTINE X 2)
Culture: NO GROWTH
Culture: NO GROWTH

## 2021-09-11 NOTE — Progress Notes (Deleted)
? ?Cardiology Office Note   ? ?Date:  09/11/2021  ? ?ID:  Tammy Walker, Tammy Walker December 24, 1930, MRN [id number] ? ? ?PCP:  Wilfrid Lund, PA ?  ?[facility]  ?Cardiologist:  Donato Schultz, MD   ?Advanced Practice Provider:  No care team member to display ?Electrophysiologist:  None  ? ?[id number]}  ? ?No chief complaint on file. ? ? ?History of Present Illness:  ?Tammy Walker is a 88 y.o. female  with a hx of paroxysmal atrial fibrillation, LBBB, aortic stenosis, chronic diastolic heart failure, HTN, HLD, GERD  ? ?Patient was first noted to have an episode of atrial fibrillation in 2002, underwent successful cardioversion, and was started on Coumadin and amiodarone. Her atrial fibrillation was well managed on this regiment, and she did not have any known recurrences of afib until 08/2020. Patient was switched from Coumadin to [facility] in 2020. 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 ? ?Patient was hospitalized in March 2022 for atrial fibrillation with RVR and chest pain. She underwent 3 failed DCCV in the emergency room. Echocardiogram on 09/24/2020 showed LVEF 60-65%, mild LVH, severely dilated LA, mild mitral valve regurgitation.  ?Was seen by EP, who recommended attempting to rate control patient with low dose beta blocker and digoxin. Patient converted to sinus rhythm on this regiment and was discharged.  ? ?Patient seen in the hospital 09/09/21 for Afib with RVR in the setting of SBO. She was started on Multaq by Dr. Eden Emms. ? ? ?Past Medical History:  ?Diagnosis Date  ? Allergy   ? Arrhythmia   ? Atrial fibrillation (HCC)   ? GERD (gastroesophageal reflux disease)   ? History of bone scan   ? Bone Density Scans 2017 & 2019 Dr. Dutch Quint   ? History of chest x-ray   ? Apart from large hiatus hernia, normal heart, lungs and mediastinum. Per [facility] from [facility]    ? History of CT scan of abdomen 10/24/2017  ?  Gallstones within a thin-waled gallbladder, Per [facility] from [facility]   ? History of echocardiogram   ? [facility]   ? History of EKG   ? Sinus rhythm, borderline 1st deg block, LAD completely changed axis, LBBB(old). Per [facility] from [facility]   ? Hyperlipidemia   ? Hypertension   ? Insomnia   ? Left lower lobe pneumonia 09/20/2015  ? Per records from [facility]   ? Nodule of right lung   ? Per records from [facility],   ? Osteoporosis   ? on fosamax  ? Osteoporosis   ? Reduced mobility   ? ? ?Past Surgical History:  ?Procedure Laterality Date  ? APPENDECTOMY  1947  ? [place], [place]   ? CT SCAN    ? 2017, 2018, 2019 -- re lungs see records   ? TUBAL LIGATION  1978  ? [place], [place], [place]   ? ? ?Current Medications: ?No outpatient medications have been marked as taking for the 09/18/21 encounter (Appointment) with Dyann Kief, PA-C.  ?  ? ?Allergies:   Patient has no known allergies.  ? ?Social History  ? ?Socioeconomic History  ? Marital status: Widowed  ?  Spouse name: Not on file  ? Number of children: Not on file  ? Years of education: Not on file  ? Highest education level: Not on file  ?Occupational History  ?  Not on file  ?Tobacco Use  ? Smoking status: Never  ? Smokeless tobacco: Never  ?Vaping Use  ? Vaping Use: Never used  ?Substance and Sexual Activity  ? Alcohol use: Yes  ?  Comment: less than one drink a week  ? Drug use: No  ? Sexual activity: Never  ?  Birth control/protection: None  ?Other Topics Concern  ? Not on file  ?Social History Narrative  ? Social History  ?   ? Diet? I watch my intake of salt, sugar, and fat  ?   ? Do you drink/eat things with caffeine? Yes   ?   ? Marital status?      Widowed                               What year were you married? 1960  ?   ? Do you live in a house, apartment, assisted living, condo, trailer, etc.? House   ?   ? Is it one or more stories? one  ?   ?  How many persons live in your home? 2  ?   ? Do you have any pets in your home? (please list) yes 2 cats   ?   ? Highest level of education completed? High school   ?   ? 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   ? Do you exercise?             yes                         Type & how often? Strength & Balance   ?   ? Advanced Directives  ?   ? Do you have a living will?  No   ?   ? Do you have a DNR form?        No                           If not, do you want to discuss one? No   ?   ? Do you have signed POA/HPOA for forms? Yes   ?   ? Functional Status  ?   ? Do you have difficulty bathing or dressing yourself? No   ?   ? Do you have difficulty preparing food or eating? No   ?   ? Do you have difficulty managing your medications? No   ?   ? Do you have difficulty managing your finances? No   ?   ? Do you have difficulty affording your medications? No   ?   ? ?Social Determinants of Health  ? ?Financial Resource Strain: Not on file  ?Food Insecurity: Not on file  ?Transportation Needs: Not on file  ?Physical Activity: Not on file  ?Stress: Not on file  ?Social Connections: Not on file  ?  ? ?Family History:  The patient's ***family history includes COPD in her mother; Heart disease in her brother, father, and mother; Heart failure in her brother and mother; High blood pressure in her mother; Hyperlipidemia in her daughter; Hypertension in her daughter.  ? ?ROS:   ?Please see the history of present illness.    ?ROS All other systems reviewed and are negative. ? ? ?PHYSICAL EXAM:   ?VS:  There were no  vitals taken for this visit.  ?Physical Exam  ?GEN: Well nourished, well developed, in no acute distress  ?HEENT: normal  ?Neck: no JVD, carotid bruits, or masses ?Cardiac:RRR; no murmurs, rubs, or gallops  ?Respiratory:  clear to auscultation bilaterally, normal work of breathing ?GI: soft, nontender, nondistended, + BS ?Ext: without cyanosis, clubbing, or edema,  Good distal pulses bilaterally ?MS: no deformity or atrophy  ?Skin: warm and dry, no rash ?Neuro:  Alert and Oriented x 3, Strength and sensation are intact ?Psych: euthymic mood, full affect ? ?Wt Readings from Last 3 Encounters:  ?09/05/21 125 lb (56.7 kg)  ?04/02/21 125 lb 1.6 oz (56.7 kg)  ?11/06/20 128 lb (58.1 kg)  ?  ? ? ?Studies/Labs Reviewed:  ? ?EKG:  EKG is*** ordered today.  The ekg ordered today demonstrates *** ? ?Recent Labs: ?09/23/2020: B Natriuretic Peptide 397.2; TSH 1.060 ?09/05/2021: ALT 12 ?09/07/2021: Magnesium 2.0 ?09/09/2021: BUN <5; Creatinine, Ser 0.55; Hemoglobin 11.3; Platelets 256; Potassium 3.2; Sodium 140  ? ?Lipid Panel ?   ?Component Value Date/Time  ? CHOL 213 (H) 08/15/2019 0827  ? TRIG 128 08/15/2019 0827  ? HDL 79 08/15/2019 0827  ? CHOLHDL 2.7 08/15/2019 0827  ? LDLCALC 110 (H) 08/15/2019 0827  ? ? ?Additional studies/ records that were reviewed today include:  ?Echo 04/22/21 ?IMPRESSIONS  ? ? ? 1. Left ventricular ejection fraction, by estimation, is 55 to 60%. The  ?left ventricle has normal function. The left ventricle has no regional  ?wall motion abnormalities. Left ventricular diastolic parameters are  ?consistent with Grade II diastolic  ?dysfunction (pseudonormalization). The E/e' is 18.0.  ? 2. Right ventricular systolic function is normal. The right ventricular  ?size is normal.  ? 3. Left atrial size was moderately dilated.  ? 4. The mitral valve is degenerative. Mild mitral valve regurgitation.  ? 5. The aortic valve is tricuspid. There is moderate calcification of the  ?aortic valve. There is moderate thickening of the aortic valve. Aortic  ?valve regurgitation is mild. Mild to moderate aortic valve  ?sclerosis/calcification is present, without any  ?evidence of aortic stenosis.  ? ?Comparison(s): No significant change from prior study.  ? ?FINDINGS  ? Left Ventricle: Left ventricular ejection fraction, by estimation, is 55  ?to 60%. The left ventricle has normal  function. The left ventricle has no  ?regional wall motion abnormalities. The left ventricular internal cavity  ?size was normal in size. There is  ? no left ventricular hypertrophy. Left ventricular diastolic para

## 2021-09-18 ENCOUNTER — Ambulatory Visit: Payer: Medicare Other | Admitting: Physician Assistant

## 2021-09-30 NOTE — Progress Notes (Signed)
?Cardiology Office Note:   ? ?Date:  10/02/2021  ? ?ID:  Tammy Walker, DOB 27-Nov-1930, MRN [id number] ? ?PCP:  Lois Huxley, PA ?  ?[facility] Providers ?Cardiologist:  Candee Furbish, MD    ? ?Referring MD: Lois Huxley, PA  ? ?Chief Complaint: follow-up atrial fibrillation ? ?History of Present Illness:   ? ?Tammy Walker is a 88 y.o. female with a hx of atrial fibrillation, aortic atherosclerosis, LBBB, mild aortic stenosis, chronic HFpEF,and chronic back pain. ? ?She was originally diagnosed with atrial fibrillation in 2002 treated with amiodarone. Had cardioversion early in her treatment. Moved to [place] from [place] after her husband died, grew up in [place]. Established with cardiology, Dr. Marlou Porch, 05/23/19 and described occasional substernal chest discomfort  relieved by a hot water bottle or ibuprofen gel, been going on for many years. No recurrent a fib episodes at that time.  ? ?She took amiodarone 200 mg a day for several years.  Her amiodarone was stopped in March 2021 because of eye changes seen by ophthalmology.  Unfortunately she had return of atrial fibrillation with RVR in March 2022 requiring hospitalization. Had unsuccessful cardioversion attempts in the ED. With input from Dr. Lovena Le, [place], she is converted to NSR on metoprolol and digoxin. Maintained on Savaysa for anticoagulation for many years. In follow-up, Dr. Lovena Le recommended watchful waiting due to return of NSR and not symptomatic with a fib. ? ?She was last seen in our office on 04/02/2021 by Dr. Marlou Porch. Echo revealed LVEF 55-60%, no wma, G2 DD, moderately dilated LA, mild MR, mild AI, moderate calcification of aortic valve, no evidence of stenosis, normal RV.  ? ?Unfortunately, she was admitted 3/9-3/13/23 with weakness and vomiting and was found to be in a fib with RVR at 145 bpm. Underwent conservative management for a small bowel obstruction and cardiology was consulted for management of A-fib.  She reported that she  remained asymptomatic with no palpitations, dizziness, shortness of breath or chest pain.  Echocardiogram shows a severely dilated left atrium.  Concerned that patient will have a hard time maintaining sinus rhythm without a rhythm controlling medication.  She was started on Multaq 400 mg twice daily, continue  ? ?Today, she is here with her daughter and presents in a wheelchair.  She reports she has chronic back pain that limits her activity.  Has been working with physical and Occupational Therapy at home.  Reports she is feeling well and denies symptoms of palpitations, chest pain, dyspnea, edema, lightheadedness, presyncope, syncope. Denies bleeding problems. States she is careful to get up slowly from lying or sitting position.  ? ?Past Medical History:  ?Diagnosis Date  ? Allergy   ? Arrhythmia   ? Atrial fibrillation ([place])   ? GERD (gastroesophageal reflux disease)   ? History of bone scan   ? Bone Density Scans 2017 & 2019 Dr. Trenton Gammon   ? History of chest x-ray   ? Apart from large hiatus hernia, normal heart, lungs and mediastinum. Per records from [facility]    ? History of CT scan of abdomen 10/24/2017  ? Gallstones within a thin-waled gallbladder, Per records from [facility]   ? History of echocardiogram   ? [facility]   ? History of EKG   ? Sinus rhythm, borderline 1st deg block, LAD completely changed axis, LBBB(old). Per records from [facility]   ? Hyperlipidemia   ? Hypertension   ? Insomnia   ?  Left lower lobe pneumonia 09/20/2015  ? Per records from [facility]   ? Nodule of right lung   ? Per records from [facility],   ? Osteoporosis   ? on fosamax  ? Osteoporosis   ? Reduced mobility   ? ? ?Past Surgical History:  ?Procedure Laterality Date  ? APPENDECTOMY  1947  ? [place], [place]   ? CT SCAN    ? 2017, 2018, 2019 -- re lungs see records   ? TUBAL LIGATION  1978  ? [place],  [place], [place]   ? ? ?Current Medications: ?Current Meds  ?Medication Sig  ? acetaminophen (TYLENOL) 500 MG tablet Take 500 mg by mouth 3 (three) times daily.  ? bisacodyl (DULCOLAX) 5 MG EC tablet Take 10 mg by mouth at bedtime.  ? Calcium-Phosphorus-Vitamin D (CALCIUM/D3 ADULT GUMMIES PO) Take 1 tablet by mouth 4 (four) times daily. Calcium 200 mg/ Vitamin D3 500 I.U. - Vitafusion  ? calcium-vitamin D (OSCAL WITH D) 500-200 MG-UNIT tablet Take 1 tablet by mouth 2 (two) times daily.  ? cetirizine (ZYRTEC) 10 MG tablet Take 10 mg by mouth every morning.  ? clotrimazole (LOTRIMIN) 1 % cream Apply 1 application. topically 2 (two) times daily as needed (fungus).  ? denosumab (PROLIA) 60 MG/ML SOSY injection Inject 60 mg into the skin every 6 (six) months.  ? diclofenac Sodium (VOLTAREN) 1 % GEL Apply 4 g topically 4 (four) times daily as needed (back pain).  ? HYDROcodone Bitartrate ER 15 MG CP12 Take 15 mg by mouth 2 (two) times daily.  ? Multiple Vitamins-Minerals (PRESERVISION AREDS 2 PO) Take 1 capsule by mouth 2 (two) times daily.  ? ondansetron (ZOFRAN) 4 MG tablet Take 1 tablet (4 mg total) by mouth every 8 (eight) hours as needed for nausea or vomiting.  ? oxyCODONE (ROXICODONE) 5 MG/5ML solution Take 2.5 mg by mouth 2 (two) times daily as needed for breakthrough pain.  ? pantoprazole (PROTONIX) 40 MG tablet Take 1 tablet (40 mg total) by mouth daily.  ? polyethylene glycol (MIRALAX / GLYCOLAX) 17 g packet Take 17 g by mouth at bedtime. Hold for loose stool  ? pregabalin (LYRICA) 25 MG capsule Take 25 mg by mouth 3 (three) times daily.  ? shark liver oil-cocoa butter (PREPARATION H) 0.25-3-85.5 % suppository Place 1 suppository rectally as needed for hemorrhoids.  ? [DISCONTINUED] atorvastatin (LIPITOR) 20 MG tablet Take 20 mg by mouth at bedtime.  ? [DISCONTINUED] dronedarone (MULTAQ) 400 MG tablet Take 1 tablet (400 mg total) by mouth 2 (two) times daily with a meal.  ? [DISCONTINUED] edoxaban (SAVAYSA) 30 MG  TABS tablet TAKE 1 TABLET(30 MG) BY MOUTH DAILY (Patient taking differently: Take 30 mg by mouth daily.)  ? [DISCONTINUED] metoprolol succinate (TOPROL-XL) 25 MG 24 hr tablet TAKE 1 TABLET(25 MG) BY MOUTH DAILY (Patient taking differently: Take 25 mg by mouth daily.)  ?  ? ?Allergies:   Patient has no known allergies.  ? ?Social History  ? ?Socioeconomic History  ? Marital status: Widowed  ?  Spouse name: Not on file  ? Number of children: Not on file  ? Years of education: Not on file  ? Highest education level: Not on file  ?Occupational History  ? Not on file  ?Tobacco Use  ? Smoking status: Never  ? Smokeless tobacco: Never  ?Vaping Use  ? Vaping Use: Never used  ?Substance and Sexual Activity  ? Alcohol use: Yes  ?  Comment: less than  one drink a week  ? Drug use: No  ? Sexual activity: Never  ?  Birth control/protection: None  ?Other Topics Concern  ? Not on file  ?Social History Narrative  ? Social History  ?   ? Diet? I watch my intake of salt, sugar, and fat  ?   ? Do you drink/eat things with caffeine? Yes   ?   ? Marital status?      Widowed                               What year were you married? 1960  ?   ? Do you live in a house, apartment, assisted living, condo, trailer, etc.? House   ?   ? Is it one or more stories? one  ?   ? How many persons live in your home? 2  ?   ? Do you have any pets in your home? (please list) yes 2 cats   ?   ? Highest level of education completed? High school   ?   ? 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   ? Do you exercise?             yes                         Type & how often? Strength & Balance   ?   ? Advanced Directives  ?   ? Do you have a living will?  No   ?   ? Do you have a DNR form?        No                           If not, do you want to discuss one? No   ?   ? Do you have signed POA/HPOA for forms? Yes   ?   ? Functional Status  ?   ? Do you have difficulty bathing or dressing yourself? No   ?   ?  Do you have difficulty preparing food or eating? No   ?   ? Do you have difficulty managing your medications? No   ?   ? Do you have difficulty managing your finances? No   ?   ? Do you have difficulty affording you

## 2021-10-01 ENCOUNTER — Ambulatory Visit (INDEPENDENT_AMBULATORY_CARE_PROVIDER_SITE_OTHER): Payer: Medicare Other | Admitting: Nurse Practitioner

## 2021-10-01 ENCOUNTER — Encounter: Payer: Self-pay | Admitting: Nurse Practitioner

## 2021-10-01 VITALS — BP 108/64 | HR 75 | Ht 62.0 in | Wt 121.8 lb

## 2021-10-01 DIAGNOSIS — Z7901 Long term (current) use of anticoagulants: Secondary | ICD-10-CM | POA: Diagnosis not present

## 2021-10-01 DIAGNOSIS — M40299 Other kyphosis, site unspecified: Secondary | ICD-10-CM

## 2021-10-01 DIAGNOSIS — I350 Nonrheumatic aortic (valve) stenosis: Secondary | ICD-10-CM | POA: Diagnosis not present

## 2021-10-01 DIAGNOSIS — I4819 Other persistent atrial fibrillation: Secondary | ICD-10-CM

## 2021-10-01 DIAGNOSIS — I700 Atherosclerosis of aorta: Secondary | ICD-10-CM

## 2021-10-01 DIAGNOSIS — I447 Left bundle-branch block, unspecified: Secondary | ICD-10-CM

## 2021-10-01 MED ORDER — ATORVASTATIN CALCIUM 20 MG PO TABS: 20.0000 mg | ORAL_TABLET | Freq: Every day | ORAL | 3 refills | Status: DC

## 2021-10-01 MED ORDER — METOPROLOL SUCCINATE ER 25 MG PO TB24: ORAL_TABLET | ORAL | 3 refills | Status: DC

## 2021-10-01 MED ORDER — EDOXABAN TOSYLATE 30 MG PO TABS: 30.0000 mg | ORAL_TABLET | ORAL | 3 refills | Status: DC

## 2021-10-01 MED ORDER — METOPROLOL SUCCINATE ER 25 MG PO TB24: 25.0000 mg | ORAL_TABLET | Freq: Every day | ORAL | 3 refills | Status: DC

## 2021-10-01 MED ORDER — DRONEDARONE HCL 400 MG PO TABS: 400.0000 mg | ORAL_TABLET | Freq: Two times a day (BID) | ORAL | 3 refills | Status: DC

## 2021-10-01 NOTE — Patient Instructions (Signed)
Medication Instructions:  ? ?Your physician recommends that you continue on your current medications as directed. Please refer to the Current Medication list given to you today. ? ? ?*If you need a refill on your cardiac medications before your next appointment, please call your pharmacy* ? ? ?Lab Work: ? ?TODAY!!!!!  CMET/CBC ? ?If you have labs (blood work) drawn today and your tests are completely normal, you will receive your results only by: ?MyChart Message (if you have MyChart) OR ?A paper copy in the mail ?If you have any lab test that is abnormal or we need to change your treatment, we will call you to review the results. ? ?Follow-Up: ?At [facility], you and your health needs are our priority.  As part of our continuing mission to provide you with exceptional heart care, we have created designated Provider Care Teams.  These Care Teams include your primary Cardiologist (physician) and Advanced Practice Providers (APPs -  Physician Assistants and Nurse Practitioners) who all work together to provide you with the care you need, when you need it. ? ?We recommend signing up for the patient portal called "MyChart".  Sign up information is provided on this After Visit Summary.  MyChart is used to connect with patients for Virtual Visits (Telemedicine).  Patients are able to view lab/test results, encounter notes, upcoming appointments, etc.  Non-urgent messages can be sent to your provider as well.   ?To learn more about what you can do with MyChart, go to [email or web address].   ? ?Your next appointment:   ?2 month(s) ? ?The format for your next appointment:   ?In Person ? ?Provider:   ?Christen Bame, NP       ? ? ?

## 2021-10-02 ENCOUNTER — Telehealth: Payer: Self-pay | Admitting: Cardiology

## 2021-10-02 LAB — COMPREHENSIVE METABOLIC PANEL
ALT: 8 IU/L (ref 0–32)
AST: 15 IU/L (ref 0–40)
Albumin/Globulin Ratio: 1.8 (ref 1.2–2.2)
Albumin: 4 g/dL (ref 3.6–4.6)
Alkaline Phosphatase: 45 IU/L (ref 44–121)
BUN/Creatinine Ratio: 13 (ref 12–28)
BUN: 12 mg/dL (ref 8–27)
Bilirubin Total: 0.4 mg/dL (ref 0.0–1.2)
CO2: 24 mmol/L (ref 20–29)
Calcium: 9 mg/dL (ref 8.7–10.3)
Chloride: 103 mmol/L (ref 96–106)
Creatinine, Ser: 0.94 mg/dL (ref 0.57–1.00)
Globulin, Total: 2.2 g/dL (ref 1.5–4.5)
Glucose: 156 mg/dL — ABNORMAL HIGH (ref 70–99)
Potassium: 4.4 mmol/L (ref 3.5–5.2)
Sodium: 140 mmol/L (ref 134–144)
Total Protein: 6.2 g/dL (ref 6.0–8.5)
eGFR: 58 mL/min/{1.73_m2} — ABNORMAL LOW (ref >59)

## 2021-10-02 LAB — CBC
Hematocrit: 37.9 % (ref 34.0–46.6)
Hemoglobin: 12.2 g/dL (ref 11.1–15.9)
MCH: 29 pg (ref 26.6–33.0)
MCHC: 32.2 g/dL (ref 31.5–35.7)
MCV: 90 fL (ref 79–97)
Platelets: 260 10*3/uL (ref 150–450)
RBC: 4.2 x10E6/uL (ref 3.77–5.28)
RDW: 13.5 % (ref 11.7–15.4)
WBC: 5.5 10*3/uL (ref 3.4–10.8)

## 2021-10-02 NOTE — Telephone Encounter (Signed)
Daughter returning a phone call... please advise ?

## 2021-10-02 NOTE — Telephone Encounter (Signed)
Noted, thank you

## 2021-10-02 NOTE — Telephone Encounter (Signed)
Spoke with pt's daughter, Tammy Walker, [place] who states pt's Digoxin was stopped during her last hospitalization and pt is not currently taking this medication.  Will forward information to Lorenda Peck for review.  [name] states she will be available by cell phone until 1030am this morning and after that will need to leave a message. ?

## 2021-11-15 ENCOUNTER — Telehealth: Payer: Self-pay | Admitting: Cardiology

## 2021-11-15 NOTE — Telephone Encounter (Signed)
Reviewed with Dr Marlou Porch who advises to d/c metoprolol and remain well hydrated.  Continue all other medications as listed. Called and spoke with Bethena Roys regarding orders.  She will discontinue the Metoprolol, encourage PO fluids and monitor VS.  She will call back if any questions/concerns and keep appt as scheduled 12/26/2021.

## 2021-11-15 NOTE — Telephone Encounter (Signed)
Pt c/o BP issue: STAT if pt c/o blurred vision, one-sided weakness or slurred speech  1. What are your last 5 BP readings?  93/58 - 2:45 pm today 104/74 - 10 am 94/58 - 9 am HR between 70-75  2. Are you having any other symptoms (ex. Dizziness, headache, blurred vision, passed out)? No  3. What is your BP issue? Daughter wanted to make Dr. Marlou Porch aware of pt's BP. She states that pt is not in distress or anything but she was just a little concerned. Please advise

## 2021-12-24 ENCOUNTER — Ambulatory Visit: Payer: Medicare Other | Admitting: Nurse Practitioner

## 2021-12-25 NOTE — Progress Notes (Unsigned)
Cardiology Office Note:    Date:  12/26/2021   ID:  Tammy Walker, Tammy Walker 1931-02-21, MRN [id number]  PCP:  Lois Huxley, PA   [facility] Providers Cardiologist:  Candee Furbish, MD     Referring MD: Lois Huxley, [place]   Chief Complaint: follow-up atrial fibrillation  History of Present Illness:    Tammy Walker is a 88 y.o. female with a hx of atrial fibrillation, aortic atherosclerosis, LBBB, mild aortic stenosis, chronic HFpEF,and chronic back pain.  She was originally diagnosed with atrial fibrillation in 2002 treated with amiodarone. Had cardioversion early in her treatment. Moved to [place] from [place] after her husband died, grew up in [place]. Established with cardiology, Dr. Marlou Porch, 05/23/19 and described occasional substernal chest discomfort  relieved by a hot water bottle or ibuprofen gel, been going on for many years. No recurrent a fib episodes at that time.   She took amiodarone 200 mg a day for several years.  Her amiodarone was stopped in March 2021 because of eye changes seen by ophthalmology.  Unfortunately she had return of atrial fibrillation with RVR in March 2022 requiring hospitalization. Had unsuccessful cardioversion attempts in the ED. With input from Dr. Lovena Le, [place], she is converted to NSR on metoprolol and digoxin. Maintained on Savaysa for anticoagulation for many years. In follow-up, Dr. Lovena Le recommended watchful waiting due to return of NSR and not symptomatic with a fib.  She was last seen in our office on 04/02/2021 by Dr. Marlou Porch. Echo revealed LVEF 55-60%, no wma, G2 DD, moderately dilated LA, mild MR, mild AI, moderate calcification of aortic valve, no evidence of stenosis, normal RV.   Unfortunately, she was admitted 3/9-3/13/23 with weakness and vomiting and was found to be in a fib with RVR at 145 bpm. Underwent conservative management for a small bowel obstruction and cardiology was consulted for management of A-fib.  She reported that she  remained asymptomatic with no palpitations, dizziness, shortness of breath or chest pain.  Echocardiogram shows a severely dilated left atrium.  Concerned that patient will have a hard time maintaining sinus rhythm without a rhythm controlling medication.  She was started on Multaq 400 mg twice daily, continue   She was last seen in our office by me on 10/01/21 in a wheelchair accompanied by her daughter. She reports she has chronic back pain that limits her activity.  Has been working with physical and Occupational Therapy at home.  Reports she is feeling well and denies symptoms of palpitations, chest pain, dyspnea, edema, lightheadedness, presyncope, syncope. Denies bleeding problems. States she is careful to get up slowly from lying or sitting position.   Today, she is here with her daughter. Noticed 2 days ago that HR was in the 90s and then 111 on recheck. She thought this was unusual. She denies chest pain, shortness of breath, lower extremity edema, fatigue, palpitations, melena, hematuria, hemoptysis, diaphoresis, weakness, presyncope, syncope, orthopnea, and PND. She has no specific cardiac complaints. Met with a nutritionist which has helped her to gain some weight and to have less abdominal pain and nausea.   Past Medical History:  Diagnosis Date   Allergy    Arrhythmia    Atrial fibrillation ([place])    GERD (gastroesophageal reflux disease)    History of bone scan    Bone Density Scans 2017 & 2019 Dr. Trenton Gammon    History of chest x-ray    Apart from large hiatus hernia, normal heart, lungs and mediastinum. Per records from  [facility]     History of CT scan of abdomen 10/24/2017   Gallstones within a thin-waled gallbladder, Per records from [facility]    History of echocardiogram    [facility]    History of EKG    Sinus rhythm, borderline 1st deg block, LAD completely changed axis, LBBB(old). Per records from [facility]    Hyperlipidemia    Hypertension    Insomnia    Left lower lobe pneumonia 09/20/2015   Per records from [facility]    Nodule of right lung    Per records from [facility],    Osteoporosis    on fosamax   Osteoporosis    Reduced mobility     Past Surgical History:  Procedure Laterality Date   [place], [place]    CT [place]     2017, 2018, 2019 -- re lungs see records    [place], [place]     Current Medications: Current Meds  Medication Sig   acetaminophen (TYLENOL) 500 MG tablet Take 500 mg by mouth 3 (three) times daily.   atorvastatin (LIPITOR) 20 MG tablet Take 1 tablet (20 mg total) by mouth at bedtime.   bisacodyl (DULCOLAX) 5 MG EC tablet Take 10 mg by mouth at bedtime.   Calcium-Phosphorus-Vitamin D (CALCIUM/D3 ADULT GUMMIES PO) Take 1 tablet by mouth 4 (four) times daily. Calcium 200 mg/ Vitamin D3 500 I.U. - Vitafusion   calcium-vitamin D (OSCAL WITH D) 500-200 MG-UNIT tablet Take 1 tablet by mouth 2 (two) times daily.   cetirizine (ZYRTEC) 10 MG tablet Take 10 mg by mouth every morning.   clotrimazole (LOTRIMIN) 1 % cream Apply 1 application. topically 2 (two) times daily as needed (fungus).   denosumab (PROLIA) 60 MG/ML SOSY injection Inject 60 mg into the skin every 6 (six) months.   diclofenac Sodium (VOLTAREN) 1 % GEL Apply 4 g topically 4 (four) times daily as needed (back pain).   dronedarone (MULTAQ) 400 MG tablet Take 1 tablet (400 mg total) by mouth 2 (two) times daily with a meal.   edoxaban (SAVAYSA) 30 MG TABS tablet Take 1 tablet (30 mg total) by mouth daily.   fluticasone (FLONASE) 50 MCG/ACT nasal spray Place into the nose.   HYDROcodone Bitartrate ER 15 MG CP12 Take 15 mg by mouth 2 (two) times daily.   Multiple Vitamins-Minerals (PRESERVISION AREDS 2 PO) Take 1 capsule by mouth 2 (two) times daily.   ondansetron (ZOFRAN) 4 MG tablet Take 1 tablet (4  mg total) by mouth every 8 (eight) hours as needed for nausea or vomiting.   oxyCODONE (ROXICODONE) 5 MG/5ML solution Take 2.5 mg by mouth 2 (two) times daily as needed for breakthrough pain.   pantoprazole (PROTONIX) 40 MG tablet Take 1 tablet (40 mg total) by mouth daily.   polyethylene glycol (MIRALAX / GLYCOLAX) 17 g packet Take 17 g by mouth at bedtime. Hold for loose stool   pregabalin (LYRICA) 25 MG capsule Take 25 mg by mouth 3 (three) times daily.   shark liver oil-cocoa butter (PREPARATION H) 0.25-3-85.5 % suppository Place 1 suppository rectally as needed for hemorrhoids.     Allergies:   Patient has no known allergies.   Social History   Socioeconomic History   Marital status: Widowed    Spouse name: Not on file   Number of children: Not on file  Years of education: Not on file   Highest education level: Not on file  Occupational History   Not on file  Tobacco Use   Smoking status: Never   Smokeless tobacco: Never  Vaping Use   Vaping Use: Never used  Substance and Sexual Activity   Alcohol use: Yes    Comment: less than one drink a week   Drug use: No   Sexual activity: Never    Birth control/protection: None  Other Topics Concern   Not on file  Social History Narrative   Social History      Diet? I watch my intake of salt, sugar, and fat      Do you drink/eat things with caffeine? Yes       Marital status?      Widowed                               What year were you married? 1960      Do you live in a house, apartment, assisted living, condo, trailer, etc.? House       Is it one or more stories? one      How many persons live in your home? 2      Do you have any pets in your home? (please list) yes 2 cats       Highest level of education completed? 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             yes                         Type & how often? Strength &  Balance       Advanced Directives      Do you have a living will?  No       Do you have a DNR form?        No                           If not, do you want to discuss one? No       Do you have signed POA/HPOA for forms? Yes       Functional Status      Do you have difficulty bathing or dressing yourself? No       Do you have difficulty preparing food or eating? No       Do you have difficulty managing your medications? No       Do you have difficulty managing your finances? No       Do you have difficulty affording your medications? No       Social Determinants of Radio broadcast assistant Strain: Not on file  Food Insecurity: Not on file  Transportation Needs: Not on file  Physical Activity: Not on file  Stress: Not on file  Social Connections: Not on file     Family History: The patient's family history includes COPD in her mother; Heart disease in her brother, father, and mother; Heart failure in her brother and mother; High blood pressure in her mother; Hyperlipidemia in her daughter; Hypertension in her daughter.  ROS:   Please see the history of present illness.  All other systems reviewed  and are negative.  Labs/Other Studies Reviewed:    The following studies were reviewed today:  Echo 04/22/21  Left Ventricle: Left ventricular ejection fraction, by estimation, is 55  to 60%. The left ventricle has normal function. The left ventricle has no  regional wall motion abnormalities. The left ventricular internal cavity  size was normal in size. There is   no left ventricular hypertrophy. Left ventricular diastolic parameters  are consistent with Grade II diastolic dysfunction (pseudonormalization).  The E/e' is 18.0.  Right Ventricle: The right ventricular size is normal. No increase in  right ventricular wall thickness. Right ventricular systolic function is  normal.  Left Atrium: Left atrial size was moderately dilated.  Right Atrium: Right atrial size  was normal in size.  Pericardium: Trivial pericardial effusion is present. Presence of  pericardial fat pad.  Mitral Valve: The mitral valve is degenerative in appearance. Mild mitral  valve regurgitation.  Tricuspid Valve: The tricuspid valve is grossly normal. Tricuspid valve  regurgitation is mild . No evidence of tricuspid stenosis.  Aortic Valve: The aortic valve is tricuspid. There is moderate  calcification of the aortic valve. There is moderate thickening of the  aortic valve. Aortic valve regurgitation is mild. Aortic regurgitation PHT  measures 712 msec. Mild to moderate aortic  valve sclerosis/calcification is present, without any evidence of aortic  stenosis.  Pulmonic Valve: The pulmonic valve was grossly normal. Pulmonic valve  regurgitation is mild. No evidence of pulmonic stenosis.  Aorta: The aortic root and ascending aorta are structurally normal, with  no evidence of dilitation.  Venous: The inferior vena cava was not well visualized.  IAS/Shunts: The atrial septum is grossly normal.   Recent Labs: 09/07/2021: Magnesium 2.0 10/01/2021: ALT 8; BUN 12; Creatinine, Ser 0.94; Hemoglobin 12.2; Platelets 260; Potassium 4.4; Sodium 140  Recent Lipid Panel    Component Value Date/Time   CHOL 213 (H) 08/15/2019 0827   TRIG 128 08/15/2019 0827   HDL 79 08/15/2019 0827   CHOLHDL 2.7 08/15/2019 0827   LDLCALC 110 (H) 08/15/2019 0827     Risk Assessment/Calculations:    CHA2DS2-VASc Score = 4  {This indicates a 4.8% annual risk of stroke. The patient's score is based upon: CHF History: 0 HTN History: 1 Diabetes History: 0 Stroke History: 0 Vascular Disease History: 0 Age Score: 2 Gender Score: 1    Physical Exam:    VS:  BP 110/80 (BP Location: Left Arm, Patient Position: Sitting, Cuff Size: Normal)   Pulse 73   Ht 5' (1.524 m)   Wt 123 lb (55.8 kg)   SpO2 97%   BMI 24.02 kg/m     Wt Readings from Last 3 Encounters:  12/26/21 123 lb (55.8 kg)  10/01/21  121 lb 12.8 oz (55.2 kg)  09/05/21 125 lb (56.7 kg)     GEN:  Frail, well-nourished elderly female no acute distress HEENT: Normal NECK: No JVD; No carotid bruits CARDIAC: RRR, 3/6 systolic murmur RUSB. No rubs, gallops RESPIRATORY:  Clear to auscultation without rales, wheezing or rhonchi  ABDOMEN: Soft, non-tender, non-distended MUSCULOSKELETAL: Kyphosis.  No edema; No deformity. 2+ pedal pulses, equal bilaterally SKIN: Warm and dry NEUROLOGIC:  Alert and oriented x 3 PSYCHIATRIC:  Normal affect   EKG:  EKG is ordered today. EKG reveals sinus rhythm at 74 bpm with 1st degree AV block, LBBB, stable QTc at 489 ms  Diagnoses:    No diagnosis found.  Assessment and Plan:     Persistent atrial fibrillation on  chronic anticoagulation: Normal sinus rhythm at 74 bpm, LBBB, stable QTc on EKG today.  Metoprolol stopped 10/2021 due to hypotension. Elevated HR 2 days ago, up to 111 bpm. No additional concerns of tachycardia. Continue Multaq.   Chronic HFpEF: Echo 03/2021 revealed normal LVEF 04-79, Grade 2 diastolic dysfunction. Wears compression stockings for bilateral LE edema. No orthopnea, PND, dyspnea. Appears euvolemic on exam.   Aortic valve calcification: Moderate aortic valve calcification without evidence of aortic stenosis. Aortic valve regurgitation is mild. She is asymptomatic. No indication for further evaluation at this time.   Left bundle branch block: LVEF 55-60% by echo 04/22/21. Continue routine EKG.  Chronic back pain: Kyphotic posture. Chronic back pain that limits her activity. She is in good spirits and says pain is manageable as long as she is able to take her time.      Disposition: 6 months with Dr. Marlou Porch  Medication Adjustments/Labs and Tests Ordered: Current medicines are reviewed at length with the patient today.  Concerns regarding medicines are outlined above.  No orders of the defined types were placed in this encounter.  No orders of the defined types  were placed in this encounter.   There are no Patient Instructions on file for this visit.   Signed, Emmaline Life, NP  12/26/2021 3:54 PM    [facility]

## 2021-12-26 ENCOUNTER — Encounter: Payer: Self-pay | Admitting: Nurse Practitioner

## 2021-12-26 ENCOUNTER — Ambulatory Visit (INDEPENDENT_AMBULATORY_CARE_PROVIDER_SITE_OTHER): Payer: Medicare Other | Admitting: Nurse Practitioner

## 2021-12-26 VITALS — BP 110/80 | HR 73 | Ht 60.0 in | Wt 123.0 lb

## 2021-12-26 DIAGNOSIS — I447 Left bundle-branch block, unspecified: Secondary | ICD-10-CM

## 2021-12-26 DIAGNOSIS — I4819 Other persistent atrial fibrillation: Secondary | ICD-10-CM | POA: Diagnosis not present

## 2021-12-26 DIAGNOSIS — Z79899 Other long term (current) drug therapy: Secondary | ICD-10-CM | POA: Diagnosis not present

## 2021-12-26 NOTE — Patient Instructions (Signed)
Medication Instructions:   Your physician recommends that you continue on your current medications as directed. Please refer to the Current Medication list given to you today.   *If you need a refill on your cardiac medications before your next appointment, please call your pharmacy*   Lab Work:  TODAY!!!! BMET/MAG  If you have labs (blood work) drawn today and your tests are completely normal, you will receive your results only by: MyChart Message (if you have MyChart) OR A paper copy in the mail If you have any lab test that is abnormal or we need to change your treatment, we will call you to review the results.   Testing/Procedures:  None ordered.   Follow-Up: At [facility], you and your health needs are our priority.  As part of our continuing mission to provide you with exceptional heart care, we have created designated Provider Care Teams.  These Care Teams include your primary Cardiologist (physician) and Advanced Practice Providers (APPs -  Physician Assistants and Nurse Practitioners) who all work together to provide you with the care you need, when you need it.  We recommend signing up for the patient portal called "MyChart".  Sign up information is provided on this After Visit Summary.  MyChart is used to connect with patients for Virtual Visits (Telemedicine).  Patients are able to view lab/test results, encounter notes, upcoming appointments, etc.  Non-urgent messages can be sent to your provider as well.   To learn more about what you can do with MyChart, go to [email or web address].    Your next appointment:   6 month(s)  The format for your next appointment:   In Person  Provider:   Donato Schultz, MD     Important Information About Sugar

## 2021-12-27 LAB — BASIC METABOLIC PANEL
BUN/Creatinine Ratio: 12 (ref 12–28)
BUN: 9 mg/dL (ref 8–27)
CO2: 24 mmol/L (ref 20–29)
Calcium: 9 mg/dL (ref 8.7–10.3)
Chloride: 105 mmol/L (ref 96–106)
Creatinine, Ser: 0.74 mg/dL (ref 0.57–1.00)
Glucose: 85 mg/dL (ref 70–99)
Potassium: 4.2 mmol/L (ref 3.5–5.2)
Sodium: 140 mmol/L (ref 134–144)
eGFR: 78 mL/min/{1.73_m2} (ref >59)

## 2021-12-27 LAB — MAGNESIUM: Magnesium: 2.4 mg/dL — ABNORMAL HIGH (ref 1.6–2.3)

## 2022-01-21 ENCOUNTER — Telehealth: Payer: Self-pay | Admitting: Cardiology

## 2022-01-21 ENCOUNTER — Telehealth: Payer: Self-pay | Admitting: Internal Medicine

## 2022-01-21 NOTE — Telephone Encounter (Signed)
STAT if HR is under 50 or over 120 (normal HR is 60-100 beats per minute)  What is your heart rate?  124   Do you have a log of your heart rate readings (document readings)?  92 133  Do you have any other symptoms? [name] is calling in and has been diagnosed with covid, and her bp is running okay, but HR is running high. She is feeling tired and weak, but overall she is doing okay and states that covid is mild. Pt's PCP did want to wait to give her the antiviral, but states if the cardiologist wants to go ahead, then they will defer to that instead.

## 2022-01-21 NOTE — Telephone Encounter (Signed)
Spoke with pt daughter Darel Hong who reports while checking pt VS for VV with PCP office; HR noted to be elevated at 133.  Pt recently tested positive for COVID. BP at 1:11 pm 103/76-100.  Daughter wants to inform MD of this.  Also, expresses would like to wait 24 hours before prescribing molnupiravir unless cardiologist feels that pt should start immediately.  PCP feels that medication s/e can be more intense than actual covid hence 24 hr delay.    Pt has not missed any doses of edoxaban or Multaq.  Metoprolol stopped May 2023 d/t hypotension.  Pt PO intake not the best d/t COVID.  Advised daughter to ensure pt is getting at least 1500 ml daily if possible.  Other than checking pt HR for PCP VV pt symptoms typical of Covid. Will route to MD to address.

## 2022-01-21 NOTE — Telephone Encounter (Signed)
I received a phone call from the patient's daughter, Darel Hong, reporting that the patient has acute COVID 19 illness.  Initially throughout the day she was relatively stable with vital signs reported on previous phone encounter.  Now into the evening prior to bed, she reports that her blood pressure 80s over 50s, heart rates in the 1 teens, with oxygen level most recently at 87% saturation.  Notes that her symptoms have overall remained the same.  She has had some shortness of breath and retching, but not significantly changed from earlier in the day she noted that she was discussing with her primary care office about antivirals for COVID, and I suggested that this was a good idea to start sooner given her oxygenation levels.  I recommended that if her oxygen levels are any lower, she should come to the emergency department immediately for evaluation and management.  Her daughter stated understanding.  Gerre Scull MD Cardiology

## 2022-01-22 ENCOUNTER — Other Ambulatory Visit: Payer: Self-pay

## 2022-01-22 ENCOUNTER — Emergency Department (HOSPITAL_BASED_OUTPATIENT_CLINIC_OR_DEPARTMENT_OTHER)
Admission: EM | Admit: 2022-01-22 | Discharge: 2022-01-22 | Disposition: A | Discharge status: Home / Self Care | Payer: Medicare Other | Attending: Emergency Medicine | Admitting: Emergency Medicine

## 2022-01-22 ENCOUNTER — Encounter (HOSPITAL_BASED_OUTPATIENT_CLINIC_OR_DEPARTMENT_OTHER): Payer: Self-pay | Admitting: Obstetrics and Gynecology

## 2022-01-22 DIAGNOSIS — I4891 Unspecified atrial fibrillation: Secondary | ICD-10-CM | POA: Insufficient documentation

## 2022-01-22 DIAGNOSIS — J029 Acute pharyngitis, unspecified: Secondary | ICD-10-CM | POA: Diagnosis present

## 2022-01-22 DIAGNOSIS — I10 Essential (primary) hypertension: Secondary | ICD-10-CM | POA: Diagnosis not present

## 2022-01-22 DIAGNOSIS — U071 COVID-19: Secondary | ICD-10-CM | POA: Diagnosis not present

## 2022-01-22 NOTE — ED Triage Notes (Addendum)
Patient reports to the ER for being covid Positive. Patient started having symptoms Monday night and tested positive for COVID on Wednesday. Patient's family reports she is getting stronger and is not having fevers. Patient's family reports the oxygen at home was 80's.  Currently on Mulnopirovir

## 2022-01-22 NOTE — ED Provider Notes (Signed)
[facility] EMERGENCY DEPT Provider Note   CSN: [id number] Arrival date & time: 01/22/22  1420     History  Chief Complaint  Patient presents with   Covid Positive    Tammy Walker is a 88 y.o. female.  Patient is an 89 year old female with a history of atrial fibrillation, GERD, hypertension, hyperlipidemia who is presenting today with her daughter due to concern of possible hypoxia.  Daughter reports on Monday she started having a mild sore throat and then yesterday she woke up feeling generally terrible.  She had fever, cough and they tested her and she tested positive for COVID.  They spoke with their PCP and their cardiologist and because she had a fever and was tired but otherwise felt okay they did not feel that she definitely needed to start molnupiravir.  However as her daughter was monitoring her throughout the day her oxygen saturations started to drop and throughout the night every few hours her daughter was checking it and it was between 44 and 88%.  She spoke with her doctors this morning just to see what else they wanted to do about this.  She asked about possible home oxygen and they requested that she come and be evaluated in person.  Daughter reports today patient has seemed much better.  She is no longer had a fever she has been more awake.  Patient denies any chest pain or shortness of breath.  She does report a mild tickle in her throat.  The history is provided by the patient and a caregiver.       Home Medications Prior to Admission medications   Medication Sig Start Date End Date Taking? Authorizing Provider  acetaminophen (TYLENOL) 500 MG tablet Take 500 mg by mouth 3 (three) times daily.    [provider]  atorvastatin (LIPITOR) 20 MG tablet Take 1 tablet (20 mg total) by mouth at bedtime. 10/01/21   Swinyer, Zachary George, NP  bisacodyl (DULCOLAX) 5 MG EC tablet Take 10 mg by mouth at bedtime.    [provider]   Calcium-Phosphorus-Vitamin D (CALCIUM/D3 ADULT GUMMIES PO) Take 1 tablet by mouth 4 (four) times daily. Calcium 200 mg/ Vitamin D3 500 I.U. - Vitafusion    [provider]  calcium-vitamin D (OSCAL WITH D) 500-200 MG-UNIT tablet Take 1 tablet by mouth 2 (two) times daily. 03/16/19   Ngetich, Dinah C, NP  cetirizine (ZYRTEC) 10 MG tablet Take 10 mg by mouth every morning.    [provider]  clotrimazole (LOTRIMIN) 1 % cream Apply 1 application. topically 2 (two) times daily as needed (fungus).    [provider]  denosumab (PROLIA) 60 MG/ML SOSY injection Inject 60 mg into the skin every 6 (six) months.    [provider]  diclofenac Sodium (VOLTAREN) 1 % GEL Apply 4 g topically 4 (four) times daily as needed (back pain).    [provider]  dronedarone (MULTAQ) 400 MG tablet Take 1 tablet (400 mg total) by mouth 2 (two) times daily with a meal. 10/01/21 10/02/22  Swinyer, Zachary George, NP  edoxaban (SAVAYSA) 30 MG TABS tablet Take 1 tablet (30 mg total) by mouth daily. 10/01/21 10/02/22  Swinyer, Zachary George, NP  fluticasone (FLONASE) 50 MCG/ACT nasal spray Place into the nose.    [provider]  HYDROcodone Bitartrate ER 15 MG CP12 Take 15 mg by mouth 2 (two) times daily. 07/02/20   [provider]  Multiple Vitamins-Minerals (PRESERVISION AREDS 2 PO) Take 1  capsule by mouth 2 (two) times daily.    [provider]  ondansetron (ZOFRAN) 4 MG tablet Take 1 tablet (4 mg total) by mouth every 8 (eight) hours as needed for nausea or vomiting. 09/09/21   Rai, Ripudeep K, MD  oxyCODONE (ROXICODONE) 5 MG/5ML solution Take 2.5 mg by mouth 2 (two) times daily as needed for breakthrough pain.    [provider]  pantoprazole (PROTONIX) 40 MG tablet Take 1 tablet (40 mg total) by mouth daily. 09/09/21   Rai, Ripudeep K, MD  polyethylene glycol (MIRALAX / GLYCOLAX) 17 g packet Take 17 g by mouth at bedtime. Hold for loose stool    [provider]  pregabalin (LYRICA) 25 MG capsule Take 25 mg by mouth 3 (three) times daily.    [provider]  shark liver oil-cocoa butter (PREPARATION H) 0.25-3-85.5 % suppository Place 1 suppository rectally as needed for hemorrhoids.    [provider]      Allergies    Patient has no known allergies.    Review of Systems   Review of Systems  Physical Exam Updated Vital Signs BP 131/87 (BP Location: Right Arm)   Pulse 97   Temp 97.8 F (36.6 C)   Resp 18   SpO2 95%  Physical Exam Vitals and nursing note reviewed.  Constitutional:      General: She is not in acute distress.    Appearance: She is well-developed.  HENT:     Head: Normocephalic and atraumatic.  Eyes:     Pupils: Pupils are equal, round, and reactive to light.  Cardiovascular:     Rate and Rhythm: Normal rate. Rhythm irregular.     Heart sounds: Normal heart sounds. No murmur heard.    No friction rub.  Pulmonary:     Effort: Pulmonary effort is normal.     Breath sounds: Normal breath sounds. No wheezing or rales.  Abdominal:     General: Bowel sounds are normal. There is no distension.     Palpations: Abdomen is soft.     Tenderness: There is no abdominal tenderness. There is no guarding or rebound.  Musculoskeletal:        General: No tenderness. Normal range of motion.     Cervical back: Normal range of motion and neck supple.     Comments: No edema.  Kyphosis  Skin:    General: Skin is warm and dry.     Findings: No rash.  Neurological:     Mental Status: She is alert and oriented to person, place, and time. Mental status is at baseline.     Cranial Nerves: No cranial nerve deficit.  Psychiatric:        Mood and Affect: Mood normal.        Behavior: Behavior normal.     ED Results / Procedures / Treatments   Labs (all labs ordered are listed, but only abnormal results are displayed) Labs Reviewed - No data to display  EKG None  Radiology No results  found.  Procedures Procedures    Medications Ordered in ED Medications - No data to display  ED Course/ Medical Decision Making/ A&P                           Medical Decision Making  Pt with multiple medical problems and comorbidities and presenting today with a complaint that caries a high risk for morbidity and mortality.  Here today with  concern for hypoxia at home.  Patient did test positive for COVID on yesterday and is currently on molnupiravir she is taken 1 dose.  Patient is well-appearing on exam.  Lung sounds are clear.  Vital signs are reassuring with an oxygen saturation of 95 to 98% on room air.  Patient is in no distress at this time.  At this time do not feel that patient needs further work-up or imaging.  Findings discussed with patient's daughter.  She will continue current medications and she was given return precautions.  No social determinants affecting the patient's discharge today.         Final Clinical Impression(s) / ED Diagnoses Final diagnoses:  COVID    Rx / DC Orders ED Discharge Orders     None         Gwyneth Sprout, MD 01/22/22 1641

## 2022-01-22 NOTE — Discharge Instructions (Signed)
If she starts complaining of shortness of breath, has any confusion or is feeling generally unwell then I would check her oxygen.  Otherwise her oxygen looked well today.  Make sure she is drinking and eating.  Continue the medications.  If she starts having recurrent fevers later this week, vomiting, confusion or shortness of breath please return to the emergency room for recheck

## 2022-01-22 NOTE — Telephone Encounter (Signed)
Spoke with the patient's daughter and advised that Dr. Anne Fu agrees with the plan. She states that the patient is looking better this morning. The patient did start the antiviral last night. States that her fever has broke this morning. She will reach back out to her primary care as well.

## 2022-01-22 NOTE — ED Notes (Signed)
Discharge paperwork given and understood. 

## 2022-03-10 ENCOUNTER — Ambulatory Visit: Payer: Medicare Other | Admitting: Cardiology

## 2022-03-17 ENCOUNTER — Ambulatory Visit: Payer: Medicare Other | Admitting: Nurse Practitioner

## 2022-04-01 ENCOUNTER — Ambulatory Visit: Payer: Medicare Other | Admitting: Cardiology

## 2022-05-13 ENCOUNTER — Telehealth: Payer: Self-pay | Admitting: Cardiology

## 2022-05-13 NOTE — Telephone Encounter (Signed)
Pts daughter called to report that the pt has been having some dizziness not related to exertion and no other assoc symptoms such as palpitations, chest pain, headache... she says it happens for just few minutes.. she eats and drinks well.[name]   Her BP has been 110/60 but has not dropped below 100/50... her BP today before eating was 105/64... her daughter will continue to monitor and will call her PCP for assessment... I moved up her appt with Dr Anne Fu for a but sooner but she will call back if anything changes.

## 2022-05-13 NOTE — Telephone Encounter (Signed)
Pt c/o BP issue: STAT if pt c/o blurred vision, one-sided weakness or slurred speech  1. What are your last 5 BP readings? 110/60 - This morning  2. Are you having any other symptoms (ex. Dizziness, headache, blurred vision, passed out)? Dizziness  3. What is your BP issue? Daughter states that pt's BP has been running low. She would like to know at what point does she need to start worrying that it's too low. Please advise

## 2022-06-02 ENCOUNTER — Ambulatory Visit: Payer: Medicare Other | Attending: Cardiology | Admitting: Cardiology

## 2022-06-02 ENCOUNTER — Encounter: Payer: Self-pay | Admitting: Cardiology

## 2022-06-02 VITALS — BP 120/70 | HR 90 | Ht 60.0 in | Wt 118.0 lb

## 2022-06-02 DIAGNOSIS — I4819 Other persistent atrial fibrillation: Secondary | ICD-10-CM | POA: Insufficient documentation

## 2022-06-02 DIAGNOSIS — I447 Left bundle-branch block, unspecified: Secondary | ICD-10-CM | POA: Diagnosis not present

## 2022-06-02 DIAGNOSIS — I350 Nonrheumatic aortic (valve) stenosis: Secondary | ICD-10-CM | POA: Insufficient documentation

## 2022-06-02 DIAGNOSIS — Z7901 Long term (current) use of anticoagulants: Secondary | ICD-10-CM | POA: Insufficient documentation

## 2022-06-02 NOTE — Patient Instructions (Signed)
Medication Instructions:  The current medical regimen is effective;  continue present plan and medications.  *If you need a refill on your cardiac medications before your next appointment, please call your pharmacy*  Follow-Up: At [facility], you and your health needs are our priority.  As part of our continuing mission to provide you with exceptional heart care, we have created designated Provider Care Teams.  These Care Teams include your primary Cardiologist (physician) and Advanced Practice Providers (APPs -  Physician Assistants and Nurse Practitioners) who all work together to provide you with the care you need, when you need it.  We recommend signing up for the patient portal called "MyChart".  Sign up information is provided on this After Visit Summary.  MyChart is used to connect with patients for Virtual Visits (Telemedicine).  Patients are able to view lab/test results, encounter notes, upcoming appointments, etc.  Non-urgent messages can be sent to your provider as well.   To learn more about what you can do with MyChart, go to [email or web address].    Your next appointment:   6 month(s)  The format for your next appointment:   In Person  Provider:   Eligha Bridegroom, NP     Then, Donato Schultz, MD will plan to see you again in 1 year(s).     Important Information About Sugar

## 2022-06-02 NOTE — Progress Notes (Signed)
Cardiology Office Note   Date:  06/02/2022   ID:  Yer, Castello 1930-09-27, MRN [id number]  PCP:  Wilfrid Lund, PA  Cardiologist:  Donato Schultz, MD  Electrophysiologist:  None   Evaluation Performed:  Follow-Up Visit   History of Present Illness:    Tammy Walker is a 88 y.o. female here for the follow-up of atrial fibrillation and congestive heart failure prior ER visit secondary to atrial fibrillation RVR.  Has seen Dr. Ladona Ridgel in EP. Had dizziness/poorly, feeble and low BP 90/50. Metoprolol was stopped in May 2023. Happens once / months  08/2021 had AFIB 145. Was started on Multaq.   Moved from [place] after her husband died.  She grew up in [place].  She is retired Furniture conservator/restorer and Conservation officer, nature.  She now resides here with her daughter.  She was taking amiodarone 200 mg a day for several years as well as edoxaban 30 mg a day for anticoagulation.  We stopped her amiodarone in March or 2021 because of eye changes seen by ophthalmology.  Unfortunately, atrial fibrillation with RVR returned in 2022.  Echocardiogram showed EF of 60% with mild aortic stenosis and mild aortic regurgitation with dilated ascending aorta.  Mild to moderate mitral regurgitation.  Her atrial fibrillation was discovered in 2002.  She had been on amiodarone since then, it was stopped in March 2021.  She had required a cardioversion earlier on.  Rarely will get substernal chest discomfort at the end of the day.  She will use a hot water bottle or ibuprofen gel to help.  This has been going on for years.  She has quite a bit of bone pain she states.  She and her husband had walked a marathon back in her 61s.  Today:  She followed up with Dr. Ladona Ridgel in 11/06/2020. At that time she had reverted back to NSR. Typically she is not able to tell if she is in rhythm or not.  She is accompanied by a family member. Overall, she continues to have consistent, "bearable" myalgias mostly due to her  back. She also has bone/muscular pain above her sternum. For treatment she sometimes holds a hot drink on the area which helps somewhat. Additionally she tries to get up and walk around to help manage the pain.  She denies any palpitations, or shortness of breath. No lightheadedness, headaches, syncope, orthopnea, or PND. Also has no lower extremity edema or exertional symptoms.   Past Medical History:  Diagnosis Date   Allergy    Arrhythmia    Atrial fibrillation (HCC)    GERD (gastroesophageal reflux disease)    History of bone scan    Bone Density Scans 2017 & 2019 Dr. Dutch Quint    History of chest x-ray    Apart from large hiatus hernia, normal heart, lungs and mediastinum. Per records from [facility]     History of CT scan of abdomen 10/24/2017   Gallstones within a thin-waled gallbladder, Per records [facility]    History of echocardiogram    [facility] &  [facility]    History of EKG    Sinus rhythm, borderline 1st deg block, LAD completely changed axis, LBBB(old). Per records from [facility]    Hyperlipidemia    Hypertension    Insomnia    Left lower lobe pneumonia 09/20/2015   Per records from [facility]    Nodule of right lung    Per records  from [facility],    Osteoporosis    on fosamax   Osteoporosis    Reduced mobility    Past Surgical History:  Procedure Laterality Date   APPENDECTOMY  [address], [place]    CT SCAN     2017, 2018, 2019 -- re lungs see records    TUBAL LIGATION  1978   [place], [place], [place]      Current Meds  Medication Sig   acetaminophen (TYLENOL) 500 MG tablet Take 500 mg by mouth 3 (three) times daily.   atorvastatin (LIPITOR) 20 MG tablet Take 1 tablet (20 mg total) by mouth at bedtime.   bisacodyl (DULCOLAX) 5 MG EC tablet Take 10 mg by mouth at bedtime.   Calcium-Phosphorus-Vitamin D (CALCIUM/D3 ADULT GUMMIES PO) Take 1  tablet by mouth 4 (four) times daily. Calcium 200 mg/ Vitamin D3 500 I.U. - Vitafusion   calcium-vitamin D (OSCAL WITH D) 500-200 MG-UNIT tablet Take 1 tablet by mouth 2 (two) times daily.   cetirizine (ZYRTEC) 10 MG tablet Take 10 mg by mouth every morning.   clotrimazole (LOTRIMIN) 1 % cream Apply 1 application. topically 2 (two) times daily as needed (fungus).   denosumab (PROLIA) 60 MG/ML SOSY injection Inject 60 mg into the skin every 6 (six) months.   diclofenac Sodium (VOLTAREN) 1 % GEL Apply 4 g topically 4 (four) times daily as needed (back pain).   dronedarone (MULTAQ) 400 MG tablet Take 1 tablet (400 mg total) by mouth 2 (two) times daily with a meal.   edoxaban (SAVAYSA) 30 MG TABS tablet Take 1 tablet (30 mg total) by mouth daily.   fluticasone (FLONASE) 50 MCG/ACT nasal spray Place into the nose.   HYDROcodone Bitartrate ER 15 MG CP12 Take 15 mg by mouth 2 (two) times daily.   Multiple Vitamins-Minerals (PRESERVISION AREDS 2 PO) Take 1 capsule by mouth 2 (two) times daily.   ondansetron (ZOFRAN) 4 MG tablet Take 1 tablet (4 mg total) by mouth every 8 (eight) hours as needed for nausea or vomiting.   oxyCODONE (ROXICODONE) 5 MG/5ML solution Take 2.5 mg by mouth 2 (two) times daily as needed for breakthrough pain.   polyethylene glycol (MIRALAX / GLYCOLAX) 17 g packet Take 17 g by mouth at bedtime. Hold for loose stool   pregabalin (LYRICA) 25 MG capsule Take 25 mg by mouth 3 (three) times daily. 50mg  in the morning 25mg  in the afternoon and evening   shark liver oil-cocoa butter (PREPARATION H) 0.25-3-85.5 % suppository Place 1 suppository rectally as needed for hemorrhoids.     Allergies:   Patient has no known allergies.   Social History   Tobacco Use   Smoking status: Never   Smokeless tobacco: Never  Vaping Use   Vaping Use: Never used  Substance Use Topics   Alcohol use: Not Currently    Comment: less than one drink a week   Drug use: No     Family Hx: The  patient's family history includes COPD in her mother; Heart disease in her brother, father, and mother; Heart failure in her brother and mother; High blood pressure in her mother; Hyperlipidemia in her daughter; Hypertension in her daughter.  ROS:   Please see the history of present illness.    (+) Myalgias (+) Back pain (+) Chest pain at sternum All other systems reviewed and are negative.   Prior CV studies:   The following studies were reviewed today:  Echocardiogram as described above showed  normal EF mild aortic stenosis mild to moderate mitral regurgitation  Echo 09/24/2020:  1. Left ventricular ejection fraction, by estimation, is 60 to 65%. The  left ventricle has normal function. The left ventricle has no regional  wall motion abnormalities. There is mild left ventricular hypertrophy.  Left ventricular diastolic parameters  are indeterminate.   2. Right ventricular systolic function is normal. The right ventricular  size is normal.   3. Left atrial size was severely dilated.   4. Right atrial size was moderately dilated.   5. The mitral valve is normal in structure. Mild mitral valve  regurgitation. No evidence of mitral stenosis.   6. The aortic valve is tricuspid. Aortic valve regurgitation is mild.  Mild to moderate aortic valve sclerosis/calcification is present, without  any evidence of aortic stenosis.   7. Aortic dilatation noted. There is mild dilatation of the ascending  aorta, measuring 38 mm.   8. IVC not well-visualized. Peak RV-RA gradient 23 mmHg.   9. The patient was in atrial fibrillation.  10. Technically difficult study with poor acoustic windows. Tec   Labs/Other Tests and Data Reviewed:    EKG:   EKG is personally reviewed and interpreted. 04/02/2021: EKG was not ordered today. 05/23/2019: sinus rhythm first-degree AV block 230 ms with left bundle branch block.  Recent Labs: 10/01/2021: ALT 8; Hemoglobin 12.2; Platelets 260 12/26/2021: BUN 9;  Creatinine, Ser 0.74; Magnesium 2.4; Potassium 4.2; Sodium 140   Recent Lipid Panel Lab Results  Component Value Date/Time   CHOL 213 (H) 08/15/2019 08:27 AM   TRIG 128 08/15/2019 08:27 AM   HDL 79 08/15/2019 08:27 AM   CHOLHDL 2.7 08/15/2019 08:27 AM   LDLCALC 110 (H) 08/15/2019 08:27 AM    Wt Readings from Last 3 Encounters:  06/02/22 118 lb (53.5 kg)  12/26/21 123 lb (55.8 kg)  10/01/21 121 lb 12.8 oz (55.2 kg)     Risk Assessment/Calculations:      Objective:     VS:  BP 120/70 (BP Location: Left Arm, Patient Position: Sitting, Cuff Size: Normal)   Pulse 90   Ht 5' (1.524 m)   Wt 118 lb (53.5 kg)   BMI 23.05 kg/m     Wt Readings from Last 3 Encounters:  06/02/22 118 lb (53.5 kg)  12/26/21 123 lb (55.8 kg)  10/01/21 121 lb 12.8 oz (55.2 kg)     GEN: Well nourished, well developed in no acute distress HEENT: Normal NECK: No JVD; No carotid bruits, kyphosis LYMPHATICS: No lymphadenopathy CARDIAC: IRREG, normal rate, 2/6 systolic murmur,no rubs, gallops RESPIRATORY:  Clear to auscultation without rales, wheezing or rhonchi  ABDOMEN: Soft, non-tender, non-distended MUSCULOSKELETAL:  No edema; No deformity  SKIN: Warm and dry NEUROLOGIC:  Alert and oriented x 3 PSYCHIATRIC:  Normal affect    ASSESSMENT & PLAN:     Paroxysmal atrial fibrillation (HCC) In March 2021 we stopped her amiodarone because of eye deposition noted by ophthalmologist.  She was doing well up until 2022 where she did develop atrial fibrillation with RVR which was transient.  Digoxin and metoprolol were utilized and she converted.  Appreciate Dr. Lubertha Basqueaylor's assistance as well.  But metoprolol 25 mg daily was stopped because of hypotension. She was put on Multaq in 08/2021 during hospital stay.  Heart rate seems reasonable.  Chronic HFpEF: Echo 03/2021 revealed normal LVEF 55-60, Grade 2 diastolic dysfunction. Wears compression stockings for bilateral LE edema. No orthopnea, PND, dyspnea. Appears  euvolemic on exam.  No changes made.  Transient hypotension Showed me reading of 90/60 transiently.  She felt febrile at the time.  Ate saltines, drank water drunk a caffeine free Diet Coke.  Went to go exercise/walk and then blood pressure was back up to the 120s.  Felt better.  Continue to be careful with this.  Chronic anticoagulation Continue with edoxaban 30 mg once a day.  She used to take this in [place].  We carried it over.  No bleeding.  Last hemoglobin 14 creatinine 0.8   Mild aortic stenosis Continuing to monitor with echocardiogram. Last echo with Moderate aortic valve calcification without evidence of aortic stenosis. Aortic valve regurgitation is mild. She is asymptomatic. No indication for further evaluation at this time    Aortic atherosclerosis [facility]) Seen on CT scan in 2014, interpretation noted.  Abdominal aorta.  Continue with good overall blood pressure control.  Also on atorvastatin 20 mg daily.  Excellent.  Last LDL 100.   Left bundle branch block Chronic, no changes.  No syncope.  She is off of amiodarone. Now on Multaq.  May require pacemaker in the future if conduction system deteriorates.    Medication Adjustments/Labs and Tests Ordered: Current medicines are reviewed at length with the patient today.  Concerns regarding medicines are outlined above.    Follow Up: 6 months with Marcelino Duster, 12 months with me  Signed, Donato Schultz, MD  06/02/2022 3:46 PM    [facility]

## 2022-06-12 ENCOUNTER — Ambulatory Visit: Payer: Medicare Other | Admitting: Cardiology

## 2022-10-06 ENCOUNTER — Other Ambulatory Visit: Payer: Self-pay | Admitting: Nurse Practitioner

## 2022-10-06 DIAGNOSIS — I4819 Other persistent atrial fibrillation: Secondary | ICD-10-CM

## 2022-10-08 ENCOUNTER — Other Ambulatory Visit: Payer: Self-pay | Admitting: Nurse Practitioner

## 2022-10-15 ENCOUNTER — Telehealth: Payer: Self-pay | Admitting: Cardiology

## 2022-10-15 ENCOUNTER — Inpatient Hospital Stay (HOSPITAL_COMMUNITY)
Admission: EM | Admit: 2022-10-15 | Discharge: 2022-10-17 | DRG: 308 | Disposition: A | Discharge status: Home / Self Care | Payer: Medicare Other | Attending: Cardiovascular Disease | Admitting: Cardiovascular Disease

## 2022-10-15 ENCOUNTER — Other Ambulatory Visit: Payer: Self-pay

## 2022-10-15 ENCOUNTER — Ambulatory Visit: Payer: Medicare Other | Admitting: Cardiovascular Disease

## 2022-10-15 ENCOUNTER — Encounter (HOSPITAL_COMMUNITY): Payer: Self-pay

## 2022-10-15 ENCOUNTER — Emergency Department (HOSPITAL_COMMUNITY): Payer: Medicare Other

## 2022-10-15 DIAGNOSIS — R079 Chest pain, unspecified: Secondary | ICD-10-CM | POA: Diagnosis present

## 2022-10-15 DIAGNOSIS — I4891 Unspecified atrial fibrillation: Secondary | ICD-10-CM | POA: Diagnosis present

## 2022-10-15 DIAGNOSIS — Z825 Family history of asthma and other chronic lower respiratory diseases: Secondary | ICD-10-CM

## 2022-10-15 DIAGNOSIS — I4819 Other persistent atrial fibrillation: Secondary | ICD-10-CM | POA: Diagnosis not present

## 2022-10-15 DIAGNOSIS — M5410 Radiculopathy, site unspecified: Secondary | ICD-10-CM

## 2022-10-15 DIAGNOSIS — I130 Hypertensive heart and chronic kidney disease with heart failure and stage 1 through stage 4 chronic kidney disease, or unspecified chronic kidney disease: Secondary | ICD-10-CM | POA: Diagnosis present

## 2022-10-15 DIAGNOSIS — I440 Atrioventricular block, first degree: Secondary | ICD-10-CM | POA: Diagnosis present

## 2022-10-15 DIAGNOSIS — E785 Hyperlipidemia, unspecified: Secondary | ICD-10-CM | POA: Diagnosis present

## 2022-10-15 DIAGNOSIS — N182 Chronic kidney disease, stage 2 (mild): Secondary | ICD-10-CM | POA: Diagnosis present

## 2022-10-15 DIAGNOSIS — I5043 Acute on chronic combined systolic (congestive) and diastolic (congestive) heart failure: Secondary | ICD-10-CM | POA: Diagnosis present

## 2022-10-15 DIAGNOSIS — I700 Atherosclerosis of aorta: Secondary | ICD-10-CM | POA: Diagnosis present

## 2022-10-15 DIAGNOSIS — I5022 Chronic systolic (congestive) heart failure: Secondary | ICD-10-CM

## 2022-10-15 DIAGNOSIS — G8929 Other chronic pain: Secondary | ICD-10-CM | POA: Diagnosis present

## 2022-10-15 DIAGNOSIS — I5033 Acute on chronic diastolic (congestive) heart failure: Secondary | ICD-10-CM | POA: Diagnosis not present

## 2022-10-15 DIAGNOSIS — S22000A Wedge compression fracture of unspecified thoracic vertebra, initial encounter for closed fracture: Secondary | ICD-10-CM

## 2022-10-15 DIAGNOSIS — G4700 Insomnia, unspecified: Secondary | ICD-10-CM | POA: Diagnosis present

## 2022-10-15 DIAGNOSIS — Z8249 Family history of ischemic heart disease and other diseases of the circulatory system: Secondary | ICD-10-CM

## 2022-10-15 DIAGNOSIS — Z79899 Other long term (current) drug therapy: Secondary | ICD-10-CM

## 2022-10-15 DIAGNOSIS — K219 Gastro-esophageal reflux disease without esophagitis: Secondary | ICD-10-CM | POA: Diagnosis present

## 2022-10-15 DIAGNOSIS — I480 Paroxysmal atrial fibrillation: Secondary | ICD-10-CM | POA: Diagnosis present

## 2022-10-15 DIAGNOSIS — I5020 Unspecified systolic (congestive) heart failure: Secondary | ICD-10-CM

## 2022-10-15 DIAGNOSIS — M419 Scoliosis, unspecified: Secondary | ICD-10-CM | POA: Diagnosis present

## 2022-10-15 DIAGNOSIS — I447 Left bundle-branch block, unspecified: Secondary | ICD-10-CM | POA: Diagnosis present

## 2022-10-15 LAB — CBC WITH DIFFERENTIAL/PLATELET
Abs Immature Granulocytes: 0.03 10*3/uL (ref 0.00–0.07)
Basophils Absolute: 0.1 10*3/uL (ref 0.0–0.1)
Basophils Relative: 1 %
Eosinophils Absolute: 0 10*3/uL (ref 0.0–0.5)
Eosinophils Relative: 1 %
HCT: 40.9 % (ref 36.0–46.0)
Hemoglobin: 13 g/dL (ref 12.0–15.0)
Immature Granulocytes: 0 %
Lymphocytes Relative: 24 %
Lymphs Abs: 1.9 10*3/uL (ref 0.7–4.0)
MCH: 28.4 pg (ref 26.0–34.0)
MCHC: 31.8 g/dL (ref 30.0–36.0)
MCV: 89.5 fL (ref 80.0–100.0)
Monocytes Absolute: 0.5 10*3/uL (ref 0.1–1.0)
Monocytes Relative: 6 %
Neutro Abs: 5.7 10*3/uL (ref 1.7–7.7)
Neutrophils Relative %: 68 %
Platelets: 265 10*3/uL (ref 150–400)
RBC: 4.57 MIL/uL (ref 3.87–5.11)
RDW: 15.4 % (ref 11.5–15.5)
WBC: 8.2 10*3/uL (ref 4.0–10.5)
nRBC: 0 % (ref 0.0–0.2)

## 2022-10-15 LAB — COMPREHENSIVE METABOLIC PANEL
ALT: 19 U/L (ref 0–44)
AST: 33 U/L (ref 15–41)
Albumin: 3.9 g/dL (ref 3.5–5.0)
Alkaline Phosphatase: 46 U/L (ref 38–126)
Anion gap: 14 (ref 5–15)
BUN: 9 mg/dL (ref 8–23)
CO2: 20 mmol/L — ABNORMAL LOW (ref 22–32)
Calcium: 9.1 mg/dL (ref 8.9–10.3)
Chloride: 99 mmol/L (ref 98–111)
Creatinine, Ser: 0.72 mg/dL (ref 0.44–1.00)
GFR, Estimated: >60 mL/min (ref >60)
Glucose, Bld: 106 mg/dL — ABNORMAL HIGH (ref 70–99)
Potassium: 4.4 mmol/L (ref 3.5–5.1)
Sodium: 133 mmol/L — ABNORMAL LOW (ref 135–145)
Total Bilirubin: 1.2 mg/dL (ref 0.3–1.2)
Total Protein: 6.5 g/dL (ref 6.5–8.1)

## 2022-10-15 LAB — BRAIN NATRIURETIC PEPTIDE: B Natriuretic Peptide: 501.2 pg/mL — ABNORMAL HIGH (ref 0.0–100.0)

## 2022-10-15 LAB — TROPONIN I (HIGH SENSITIVITY)
Troponin I (High Sensitivity): 5 ng/L (ref <18)
Troponin I (High Sensitivity): 6 ng/L (ref <18)

## 2022-10-15 MED ORDER — EDOXABAN TOSYLATE 30 MG PO TABS
30.0000 mg | ORAL_TABLET | Freq: Every evening | ORAL | Status: DC
  Filled 2022-10-15 (×3): qty 1

## 2022-10-15 MED ORDER — FLUTICASONE PROPIONATE 50 MCG/ACT NA SUSP
2.0000 | Freq: Every evening | NASAL | Status: DC
  Filled 2022-10-15: qty 16

## 2022-10-15 MED ORDER — ACETAMINOPHEN 500 MG PO TABS
500.0000 mg | ORAL_TABLET | Freq: Three times a day (TID) | ORAL | Status: DC
  Administered 2022-10-16 – 2022-10-17 (×3): 500 mg via ORAL
  Filled 2022-10-15 (×4): qty 1

## 2022-10-15 MED ORDER — MORPHINE SULFATE (PF) 4 MG/ML IV SOLN
4.0000 mg | Freq: Once | INTRAVENOUS | Status: AC
  Administered 2022-10-15: 4 mg via INTRAVENOUS
  Filled 2022-10-15: qty 1

## 2022-10-15 MED ORDER — OXYCODONE HCL ER 10 MG PO T12A
10.0000 mg | EXTENDED_RELEASE_TABLET | Freq: Two times a day (BID) | ORAL | Status: DC
  Administered 2022-10-16 – 2022-10-17 (×3): 10 mg via ORAL
  Filled 2022-10-15 (×3): qty 1

## 2022-10-15 MED ORDER — PREGABALIN 25 MG PO CAPS: 25.0000 mg | ORAL_CAPSULE | ORAL | Status: DC

## 2022-10-15 MED ORDER — BISACODYL 5 MG PO TBEC
10.0000 mg | DELAYED_RELEASE_TABLET | Freq: Every day | ORAL | Status: DC
  Administered 2022-10-16: 10 mg via ORAL
  Filled 2022-10-15: qty 2

## 2022-10-15 MED ORDER — OYSTER SHELL CALCIUM/D3 500-5 MG-MCG PO TABS
1.0000 | ORAL_TABLET | Freq: Two times a day (BID) | ORAL | Status: DC
  Administered 2022-10-16 – 2022-10-17 (×3): 1 via ORAL
  Filled 2022-10-15 (×6): qty 1

## 2022-10-15 MED ORDER — PREGABALIN 75 MG PO CAPS
75.0000 mg | ORAL_CAPSULE | Freq: Every day | ORAL | Status: DC
  Administered 2022-10-16 – 2022-10-17 (×2): 75 mg via ORAL
  Filled 2022-10-15 (×2): qty 1

## 2022-10-15 MED ORDER — DICLOFENAC SODIUM 1 % EX GEL
4.0000 g | Freq: Four times a day (QID) | CUTANEOUS | Status: DC | PRN
  Filled 2022-10-15: qty 100

## 2022-10-15 MED ORDER — PREGABALIN 25 MG PO CAPS
25.0000 mg | ORAL_CAPSULE | ORAL | Status: DC
  Administered 2022-10-16 – 2022-10-17 (×2): 25 mg via ORAL
  Filled 2022-10-15 (×2): qty 1

## 2022-10-15 MED ORDER — POLYETHYLENE GLYCOL 3350 17 G PO PACK
17.0000 g | PACK | Freq: Every day | ORAL | Status: DC
  Administered 2022-10-16: 17 g via ORAL
  Filled 2022-10-15: qty 1

## 2022-10-15 MED ORDER — LIDOCAINE 5 % EX PTCH
1.0000 | MEDICATED_PATCH | CUTANEOUS | Status: DC
  Administered 2022-10-15: 1 via TRANSDERMAL
  Filled 2022-10-15: qty 1

## 2022-10-15 MED ORDER — FUROSEMIDE 10 MG/ML IJ SOLN
20.0000 mg | Freq: Two times a day (BID) | INTRAMUSCULAR | Status: AC
Start: 2022-10-15 — End: 2022-10-16
  Administered 2022-10-15 – 2022-10-16 (×2): 20 mg via INTRAVENOUS
  Filled 2022-10-15 (×2): qty 2

## 2022-10-15 MED ORDER — ATORVASTATIN CALCIUM 10 MG PO TABS
20.0000 mg | ORAL_TABLET | Freq: Every evening | ORAL | Status: DC
  Administered 2022-10-16: 20 mg via ORAL
  Filled 2022-10-15: qty 2

## 2022-10-15 MED ORDER — PHENYLEPHRINE IN HARD FAT 0.25 % RE SUPP: 1.0000 | RECTAL | Status: DC | PRN

## 2022-10-15 MED ORDER — DRONEDARONE HCL 400 MG PO TABS
400.0000 mg | ORAL_TABLET | Freq: Two times a day (BID) | ORAL | Status: DC
  Administered 2022-10-16 – 2022-10-17 (×3): 400 mg via ORAL
  Filled 2022-10-15 (×5): qty 1

## 2022-10-15 MED ORDER — ONDANSETRON HCL 4 MG PO TABS: 4.0000 mg | ORAL_TABLET | Freq: Three times a day (TID) | ORAL | Status: DC | PRN

## 2022-10-15 MED ORDER — HYDROCODONE BITARTRATE ER 15 MG PO CP12: 15.0000 mg | ORAL_CAPSULE | Freq: Two times a day (BID) | ORAL | Status: DC

## 2022-10-15 MED ORDER — LORATADINE 10 MG PO TABS
10.0000 mg | ORAL_TABLET | Freq: Every day | ORAL | Status: DC
  Administered 2022-10-16 – 2022-10-17 (×2): 10 mg via ORAL
  Filled 2022-10-15 (×2): qty 1

## 2022-10-15 NOTE — Telephone Encounter (Signed)
Daughter called in reporting her mother's HR has been elevated in the 100-120 range this morning. BPs in the 140s systolic. No significant shortness of breath or LE edema noted. No reports of anginal symptoms. She remains on Multaq and savaysa. Advised that since she is minimally symptomatic at this time I would route a message to the office to see if appt available to be seen today. If things were to change in the interim, ER precautions given. Daughter thanked me for called back and agreeable to plan.

## 2022-10-15 NOTE — Consult Note (Deleted)
See H and P note

## 2022-10-15 NOTE — ED Triage Notes (Signed)
Pt came in via POV d/t CP that began last night & it radiates all the way around her back. States she has osteoarthritis & Hx of breast bone hurting but last night she states it became unbearable & she had to take deep breaths to bear through it. A/Ox4, rates pain 9/10. Daughter (POA) at bedside & states her cardiologist knows that she is here. Tachycardia noted while in triage.

## 2022-10-15 NOTE — Telephone Encounter (Signed)
Received triage note that Tammy Page, NP would like patient to be seen today for elevated HR. Called patient and daughter (DPR) to offer appt today in [facility] w/ DOD. Dtr reports that patient is actively having an episode of SOB and chest pain. She reports their home monitoring shows HR is 103, BP is 133/89 and SpO2 is 96% but patient is reporting she is "miserable". Daughter reports patient has already taken her multaq today. Daughter states "I think she needs help within the hour". Advised dtr and patient present at ED, daughter verbalizes understanding and agrees to plan. Message to [facility] MD coordinator.

## 2022-10-15 NOTE — ED Provider Notes (Signed)
  Physical Exam  BP 121/81   Pulse (!) 103   Temp 98 F (36.7 C)   Resp 20   SpO2 94%   Physical Exam Vitals and nursing note reviewed.  Constitutional:      General: She is not in acute distress.    Appearance: Normal appearance. She is normal weight. She is not ill-appearing.     Comments: Resting comfortably in bed  HENT:     Head: Normocephalic and atraumatic.  Pulmonary:     Effort: Pulmonary effort is normal. No respiratory distress.  Abdominal:     General: Abdomen is flat.  Musculoskeletal:        General: Normal range of motion.     Cervical back: Neck supple.     Comments: Compression stockings on bilateral lower legs  Skin:    General: Skin is warm and dry.  Neurological:     Mental Status: She is alert and oriented to person, place, and time.  Psychiatric:        Mood and Affect: Mood normal.        Behavior: Behavior normal.     Procedures  Procedures  ED Course / MDM    Medical Decision Making Risk Prescription drug management. Decision regarding hospitalization.   Assumed care at shift change from [facility], [place].  Please see his note for full HPI.  In short, 89 year old female presenting to the ED for evaluation of chest discomfort and elevated heart rate.  Was found to be in A-fib with RVR.  Workup was overall reassuring.  Patient has been evaluated by cardiology PA in the ED.  Current plan is pending cardiology review and decision regarding admission versus discharge.  1615-patient evaluated by cardiology.  Will be direct admitted to cardiology service for further workup and management.  Note: Portions of this report may have been transcribed using voice recognition software. Every effort was made to ensure accuracy; however, inadvertent computerized transcription errors may still be present.   Michelle Piper, Cordelia Poche 10/15/22 1623    Pricilla Loveless, MD 10/15/22 2252

## 2022-10-15 NOTE — ED Provider Notes (Signed)
[place] EMERGENCY DEPARTMENT AT [facility] Provider Note   CSN: [id number] Arrival date & time: 10/15/22  2952     History  Chief Complaint  Patient presents with   Chest Pain    Tammy Walker is a 89 y.o. female.  The history is provided by the patient, a relative and medical records. No language interpreter was used.  Chest Pain    89 year old female with significant history of atrial fibrillation currently on edoxaban, GERD, aortic atherosclerosis, hypertension, brought here by private vehicle from home for evaluation of chest pain.  History obtained through patient and through daughter who is at bedside.  Patient states for many years she has had this recurrent pain to her epigastric/sternal region that comes and goes.  However last night her pain became much more intense and spread across her chest.  She described this pain as a tight pressure sensation like a "rope tied around my chest".  She did felt a bit nauseous and endorsing shortness of breath.  Her pain comes in waves and at this time she report minimal pain.  Family did reach out to cardiology team who recommend patient to come to ER for further assessment.  Patient otherwise denies having any fever chills productive cough, vomiting, abdominal pain, back pain.  She denies any recent sickness.  She is compliant with her medication.  Patient also mentions she has never had any cardiac stress test or heart catheterization in the past when asked.  She does have history of A-fib but states she does not normally notice any heart palpitation.  Home Medications Prior to Admission medications   Medication Sig Start Date End Date Taking? Authorizing Provider  acetaminophen (TYLENOL) 500 MG tablet Take 500 mg by mouth 3 (three) times daily.    [provider]  atorvastatin (LIPITOR) 20 MG tablet TAKE 1 TABLET(20 MG) BY MOUTH AT BEDTIME 10/08/22   [name], MD  bisacodyl (DULCOLAX) 5 MG EC tablet Take 10 mg  by mouth at bedtime.    [provider]  Calcium-Phosphorus-Vitamin D (CALCIUM/D3 ADULT GUMMIES PO) Take 1 tablet by mouth 4 (four) times daily. Calcium 200 mg/ Vitamin D3 500 I.U. - Vitafusion    [provider]  calcium-vitamin D (OSCAL WITH D) 500-200 MG-UNIT tablet Take 1 tablet by mouth 2 (two) times daily. 03/16/19   Ngetich, Dinah C, NP  cetirizine (ZYRTEC) 10 MG tablet Take 10 mg by mouth every morning.    [provider]  clotrimazole (LOTRIMIN) 1 % cream Apply 1 application. topically 2 (two) times daily as needed (fungus).    [provider]  denosumab (PROLIA) 60 MG/ML SOSY injection Inject 60 mg into the skin every 6 (six) months.    [provider]  diclofenac Sodium (VOLTAREN) 1 % GEL Apply 4 g topically 4 (four) times daily as needed (back pain).    [provider]  dronedarone (MULTAQ) 400 MG tablet Take 1 tablet (400 mg total) by mouth 2 (two) times daily with a meal. 10/01/21 10/02/22  Swinyer, Zachary George, NP  edoxaban (SAVAYSA) 30 MG TABS tablet TAKE 1 TABLET(30 MG) BY MOUTH DAILY. 10/06/22   Swinyer, Zachary George, NP  fluticasone (FLONASE) 50 MCG/ACT nasal spray Place into the nose.    [provider]  HYDROcodone Bitartrate ER 15 MG CP12 Take 15 mg by mouth 2 (two) times daily. 07/02/20   [provider]  Multiple Vitamins-Minerals (PRESERVISION AREDS 2 PO) Take 1 capsule by mouth 2 (  two) times daily.    [provider]  ondansetron (ZOFRAN) 4 MG tablet Take 1 tablet (4 mg total) by mouth every 8 (eight) hours as needed for nausea or vomiting. 09/09/21   Rai, Ripudeep K, MD  oxyCODONE (ROXICODONE) 5 MG/5ML solution Take 2.5 mg by mouth 2 (two) times daily as needed for breakthrough pain.    [provider]  polyethylene glycol (MIRALAX / GLYCOLAX) 17 g packet Take 17 g by mouth at bedtime. Hold for loose stool    [provider]  pregabalin (LYRICA) 25 MG capsule Take 25 mg by mouth 3 (three)  times daily. 50mg  in the morning 25mg  in the afternoon and evening    [provider]  shark liver oil-cocoa butter (PREPARATION H) 0.25-3-85.5 % suppository Place 1 suppository rectally as needed for hemorrhoids.    [provider]      Allergies    Patient has no known allergies.    Review of Systems   Review of Systems  Cardiovascular:  Positive for chest pain.  All other systems reviewed and are negative.   Physical Exam Updated Vital Signs BP (!) 145/92   Pulse (!) 129   Temp (!) 97.5 F (36.4 C) (Oral)   Resp 18   SpO2 99%  Physical Exam Vitals and nursing note reviewed.  Constitutional:      General: She is not in acute distress.    Appearance: She is well-developed.     Comments: Elderly female laying in bed appears to be in no acute discomfort.  HENT:     Head: Atraumatic.  Eyes:     Conjunctiva/sclera: Conjunctivae normal.  Cardiovascular:     Rate and Rhythm: Tachycardia present. Rhythm irregular.  Pulmonary:     Effort: Pulmonary effort is normal.     Comments: No significant sternal tenderness to epigastrium is noted. Chest:     Chest wall: No tenderness.  Abdominal:     Palpations: Abdomen is soft.     Tenderness: There is no abdominal tenderness.  Musculoskeletal:     Cervical back: Neck supple.     Comments: Severe kyphotic spine without any significant midline spine tenderness.  Skin:    Findings: No rash.  Neurological:     Mental Status: She is alert.  Psychiatric:        Mood and Affect: Mood normal.     ED Results / Procedures / Treatments   Labs (all labs ordered are listed, but only abnormal results are displayed) Labs Reviewed  COMPREHENSIVE METABOLIC PANEL  CBC WITH DIFFERENTIAL/PLATELET  TROPONIN I (HIGH SENSITIVITY)    EKG EKG Interpretation  Date/Time:  Wednesday October 15 2022 10:16:00 EDT Ventricular Rate:  114 PR Interval:    QRS Duration: 132 QT Interval:  314 QTC Calculation: 432 R  Axis:   130 Text Interpretation: Atrial fibrillation with rapid ventricular response Right axis deviation Non-specific intra-ventricular conduction block Cannot rule out Anterior infarct , age undetermined T wave abnormality, consider inferolateral ischemia Abnormal ECG When compared with ECG of 09-Sep-2021 14:37, PREVIOUS ECG IS PRESENT when compared to prior, faster rate. No STEMI Confirmed by Theda Belfast ([id number]) on 10/15/2022 10:39:43 AM  Radiology DG Chest 1 View  Result Date: 10/15/2022 CLINICAL DATA:  Chest pain and shortness of breath. EXAM: CHEST  1 VIEW COMPARISON:  Chest radiograph 09/05/2021. FINDINGS: The heart is enlarged, unchanged. The upper mediastinal contours are within normal limits with calcified plaque in the aortic arch. Asymmetric opacity over the lateral left  hemithorax is favored artifactual related to overlying soft tissues and patient rotation. Retrocardiac opacity is consistent with a hiatal hernia as seen on prior CT. There is no convincing acute airspace opacity. There is no pulmonary edema. There is no pleural effusion or pneumothorax There is no acute osseous abnormality. IMPRESSION: 1. No radiographic evidence of acute cardiopulmonary process. 2. Unchanged cardiomegaly and hiatal hernia. Electronically Signed   By: Lesia Hausen M.D.   On: 10/15/2022 10:49    Procedures Procedures    Medications Ordered in ED Medications  morphine (PF) 4 MG/ML injection 4 mg (4 mg Intravenous Given 10/15/22 1235)    ED Course/ Medical Decision Making/ A&P                             Medical Decision Making Risk Prescription drug management.   BP 121/85   Pulse (!) 113   Temp (!) 97.5 F (36.4 C) (Oral)   Resp (!) 23   SpO2 97%   47:64 AM  89 year old female with significant history of atrial fibrillation currently on edoxaban, GERD, aortic atherosclerosis, hypertension, brought here by private vehicle from home for evaluation of chest pain.  History obtained through  patient and through daughter who is at bedside.  Patient states for many years she has had this recurrent pain to her epigastric/sternal region that comes and goes.  However last night her pain became much more intense and spread across her chest.  She described this pain as a tight pressure sensation like a "rope tied around my chest".  She did felt a bit nauseous and endorsing shortness of breath.  Her pain comes in waves and at this time she report minimal pain.  Family did reach out to cardiology team who recommend patient to come to ER for further assessment.  Patient otherwise denies having any fever chills productive cough, vomiting, abdominal pain, back pain.  She denies any recent sickness.  She is compliant with her medication.  Patient also mentions she has never had any cardiac stress test or heart catheterization in the past when asked.  She does have history of A-fib but states she does not normally notice any heart palpitation.  On exam this is an elderly female with severe kyphosis who is resting comfortably in bed appears to be in no acute discomfort.  Heart notable for tachycardia with irregularly irregular heart rhythm, lungs otherwise clear to auscultation bilaterally abdomen soft nontender with minimal epigastric tenderness noted.  She has intact pulses throughout.  Vital signs notable for heart rate of 129.  Patient is afebrile no hypoxia.  Care discussed with Dr. Rush Landmark.   -Labs ordered, independently viewed and interpreted by me.  Labs remarkable for normal troponin, labs are reassuring -The patient was maintained on a cardiac monitor.  I personally viewed and interpreted the cardiac monitored which showed an underlying rhythm of: afib RVR -Imaging independently viewed and interpreted by me and I agree with radiologist's interpretation.  Result remarkable for CXR showing no acute changes -This patient presents to the ED for concern of chest pain, this involves an extensive number of  treatment options, and is a complaint that carries with it a high risk of complications and morbidity.  The differential diagnosis includes ACS, costochondritis, epigastric pain, pna, pe, dissection, acs, GERD, gastritis -Co morbidities that complicate the patient evaluation includes afib, GERD, HTN -Treatment includes morphine -Reevaluation of the patient after these medicines showed that the patient improved -  PCP office notes or outside notes reviewed -Discussion with cardiology team who evaluated pt and felt pt likely stable for discharge with outpt f/u -Escalation to admission/observation considered: patient is agreeablw with plan  11:58 AM I have reached out to cardiology team and spoke with Rosann Auerbach, the card master who will request for the cardiology team to be involved in patient care.  1:01 PM Patient report her pain returned, patient was given morphine on reassessment pain improved.  She is currently resting comfortably and awaits cardiology team to be evaluated.  3:13 PM Cardiology PA has seen evaluate patient and felt that patient is likely stable to be discharged home with outpatient follow-up.  Her A-fib with RVR spontaneously resolved.  She did receive some morphine for pain and now she is pain-free.  Her troponin is normal second troponin is pending.  Patient signed out to oncoming provider who will follow-up on cardiology note to confirm disposition.        Final Clinical Impression(s) / ED Diagnoses Final diagnoses:  Chest pain, unspecified type  Atrial fibrillation with RVR    Rx / DC Orders ED Discharge Orders     None         Fayrene Helper, PA-C 10/15/22 1537    Melene Plan, DO 10/18/22 1459

## 2022-10-15 NOTE — Progress Notes (Signed)
Code status reviewed. Currently: full code.

## 2022-10-15 NOTE — ED Notes (Signed)
Provider made aware patient reporting cp and trouble breathing.

## 2022-10-15 NOTE — Progress Notes (Signed)
On call this PM with Yvonna Alanis. We received notification pharmacy does not carry form of hydrocodone extended release on formulary. We spoke with pharmD Judeth Cornfield who advised dose conversion to oxycodone 12 hr  BID. This appears to have already been entered by a separate pharmacist already. Patient has not filled PRN roxicodone recently per PDMP review so we'll hold off on entering.

## 2022-10-15 NOTE — H&P (Signed)
Cardiology Admission History and Physical   Patient ID: Tammy Walker MRN: [id number]; DOB: 06/15/1931   Admission date: 10/15/2022  PCP:  Wilfrid Lund, PA   [facility] Providers Cardiologist:  Donato Schultz, MD   {  Chief Complaint:  Chest pain   Patient Profile:   Tammy Walker is a 89 y.o. 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 Rush Landmark.   History of Present Illness:   Tammy Walker has history of paroxysmal atrial fibrillation with RVR that has dated all the way back in 2002.  Currently she is being seen by Dr. Anne Fu and was last seen in December 2023 but has also seen Dr. Ladona Ridgel in EP for her afib.  Patient has had multiple different treatment regimens including long-term amiodarone for years at 200 mg a day that was stopped due to eye deposits seen by ophthalmology in 2021. Her A-fib returned back in 2022 and she was hospitalized and started on digoxin and metoprolol and converted back to NSR then was seen by Dr. Ladona Ridgel for follow up. Metoprolol was then stopped in May 2023 due to dizziness, hypotension.  Now patient is on Multaq that was started in March 2023 during hospital admission for SBO.  Generally patient is asymptomatic and does not feel her atrial fibrillation. Additionally at some point patient has had cardioversion at some point however unknown when that was. Last echocardiogram was in October 2022 that revealed a normal EF of 55 to 60%, grade 2 diastolic dysfunction.  She is typically euvolemic and has not required diuretics. No prior ischemic evaluations.   Earlier this morning patient's daughter called outpatient call line with complaints of elevated heart rate 100-100 20s this morning and systolic BP of 140.  During that call her daughter did not note any  anginal symptoms or shortness of breath at that time.  Now she is presenting to the emergency room with complaints of shortness of breath and chest pain that started last night around midnight and woke her up from her sleep this morning around 0500 with an elevated heart rate of around 140. Patient states that she has had recurrent pain around her sternum for many years since being diagnosed with osteoarthritis that is intermittent.  However she said last night her pain became more intense rating it 9/10 and started feeling heaviness that was wrapping around her chest with accompanied shortness of breath.  Generally her sternum pain has no pattern to it and is not exacerbated by activity and generally lasts less than an hour.  Sometimes patient will warm up a water bottle and finds relief with that.  No history of acid reflux.  No pain with palpation and not reproducible.  No recent infections, denies any fever, cough. She received morphine in the ED. She is pain free with HR low 100s.   ED workup: EKG shows atrial fibrillation with RVR and abnormal T waves morphology in inferolateral leads. Consistent with prior readings.  Left bundle branch block. No STE. Neg CX.  First troponin negative.  No other significant labs or imaging.   Past Medical History:  Diagnosis Date   Allergy    Arrhythmia    Atrial fibrillation    GERD (gastroesophageal reflux disease)    History of bone scan  Bone Density Scans 2017 & 2019 Dr. Dutch Quint    History of chest x-ray    Apart from large hiatus hernia, normal heart, lungs and mediastinum. Per records from [facility]     History of CT scan of abdomen 10/24/2017   Gallstones within a thin-waled gallbladder, Per records from [facility]    History of echocardiogram    [facility] &  following   [facility]    History of EKG    Sinus rhythm, borderline 1st deg block, LAD completely changed axis, LBBB(old). Per records from [facility]    Hyperlipidemia    Hypertension    Insomnia    Left lower lobe pneumonia 09/20/2015   Per records from [facility]    Nodule of right lung    Per records from [facility],    Osteoporosis    on fosamax   Osteoporosis    Reduced mobility     Past Surgical History:  Procedure Laterality Date   APPENDECTOMY  [address], [place]    CT SCAN     2017, 2018, 2019 -- re lungs see records    TUBAL LIGATION  1978   [place], [place], [place]      Medications Prior to Admission: Prior to Admission medications   Medication Sig Start Date End Date Taking? Authorizing Provider  acetaminophen (TYLENOL) 500 MG tablet Take 500 mg by mouth 3 (three) times daily.    [provider]  atorvastatin (LIPITOR) 20 MG tablet TAKE 1 TABLET(20 MG) BY MOUTH AT BEDTIME 10/08/22   [name], MD  bisacodyl (DULCOLAX) 5 MG EC tablet Take 10 mg by mouth at bedtime.    [provider]  Calcium-Phosphorus-Vitamin D (CALCIUM/D3 ADULT GUMMIES PO) Take 1 tablet by mouth 4 (four) times daily. Calcium 200 mg/ Vitamin D3 500 I.U. - Vitafusion    [provider]  calcium-vitamin D (OSCAL WITH D) 500-200 MG-UNIT tablet Take 1 tablet by mouth 2 (two) times daily. 03/16/19   Ngetich, Dinah C, NP  cetirizine (ZYRTEC) 10 MG tablet Take 10 mg by mouth every morning.    [provider]  clotrimazole (LOTRIMIN) 1 % cream Apply 1 application. topically 2 (two) times daily as needed (fungus).    [provider]  denosumab (PROLIA) 60 MG/ML SOSY injection Inject 60 mg into the skin every 6 (six) months.    [provider]  diclofenac Sodium (VOLTAREN) 1 % GEL Apply 4 g topically 4 (four) times daily as needed (back pain).    [provider]  dronedarone (MULTAQ) 400 MG tablet Take 1 tablet (400 mg total) by mouth 2 (two) times daily with a meal. 10/01/21 10/02/22  Swinyer, Zachary George, NP  edoxaban (SAVAYSA) 30 MG TABS  tablet TAKE 1 TABLET(30 MG) BY MOUTH DAILY. 10/06/22   Swinyer, Zachary George, NP  fluticasone (FLONASE) 50 MCG/ACT nasal spray Place into the nose.    [provider]  HYDROcodone Bitartrate ER 15 MG CP12 Take 15 mg by mouth 2 (two) times daily. 07/02/20   [provider]  Multiple Vitamins-Minerals (PRESERVISION AREDS 2 PO) Take 1 capsule by mouth 2 (two) times daily.    [provider]  ondansetron (ZOFRAN) 4 MG tablet Take 1 tablet (4 mg total) by mouth every 8 (eight) hours as needed for nausea or vomiting. 09/09/21   Rai, Ripudeep K, MD  oxyCODONE (ROXICODONE) 5 MG/5ML solution Take 2.5 mg by mouth 2 (two) times  daily as needed for breakthrough pain.    [provider]  polyethylene glycol (MIRALAX / GLYCOLAX) 17 g packet Take 17 g by mouth at bedtime. Hold for loose stool    [provider]  pregabalin (LYRICA) 25 MG capsule Take 25 mg by mouth 3 (three) times daily.  in the morning  in the afternoon and evening    [provider]  shark liver oil-cocoa butter (PREPARATION H) 0.25-3-85.5 % suppository Place 1 suppository rectally as needed for hemorrhoids.    [provider]     Allergies:   No Known Allergies  Social History:   Social History   Socioeconomic History   Marital status: Widowed    Spouse name: Not on file   Number of children: Not on file   Years of education: Not on file   Highest education level: Not on file  Occupational History   Not on file  Tobacco Use   Smoking status: Never   Smokeless tobacco: Never  Vaping Use   Vaping Use: Never used  Substance and Sexual Activity   Alcohol use: Not Currently    Comment: less than one drink a week   Drug use: No   Sexual activity: Never    Birth control/protection: None  Other Topics Concern   Not on file  Social History Narrative   Social History      Diet? I watch my intake of salt, sugar, and fat      Do you drink/eat things with caffeine? Yes        Marital status?      Widowed                               What year were you married? 1960      Do you live in a house, apartment, assisted living, condo, trailer, etc.? House       Is it one or more stories? one      How many persons live in your home? 2      Do you have any pets in your home? (please list) yes 2 cats       Highest level of education completed? 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             yes                         Type & how often? Strength & Balance       Advanced Directives      Do you have a living will?  No       Do you have a DNR form?        No                           If not, do you want to discuss one? No       Do you have signed POA/HPOA for forms? Yes       Functional Status      Do you have difficulty bathing or dressing yourself? No       Do you have difficulty preparing food or eating? No       Do you have difficulty managing  your medications? No       Do you have difficulty managing your finances? No       Do you have difficulty affording your medications? No       Social Determinants of Corporate investment banker Strain: Not on file  Food Insecurity: Not on file  Transportation Needs: Not on file  Physical Activity: Not on file  Stress: Not on file  Social Connections: Not on file  Intimate Partner Violence: Not on file    Family History:   The patient's family history includes COPD in her mother; Heart disease in her brother, father, and mother; Heart failure in her brother and mother; High blood pressure in her mother; Hyperlipidemia in her daughter; Hypertension in her daughter.    ROS:  Please see the history of present illness.  All other ROS reviewed and negative.     Physical Exam/Data:   Vitals:   10/15/22 1315 10/15/22 1430 10/15/22 1457 10/15/22 1530  BP: 115/85 123/83  122/80  Pulse: 99 100  (!) 110   Resp: 18 19  (!) 27  Temp:   98 F (36.7 C)   TempSrc:      SpO2: 94% 95%  94%   No intake or output data in the 24 hours ending 10/15/22 1602    06/02/2022    1:49 PM 12/26/2021    3:43 PM 10/01/2021    1:32 PM  Last 3 Weights  Weight (lbs) 118 lb 123 lb 121 lb 12.8 oz  Weight (kg) 53.524 kg 55.792 kg 55.248 kg     There is no height or weight on file to calculate BMI.  General:  Well nourished, well developed, in no acute distress HEENT: normal Neck: no JVD Vascular: No carotid bruits; Distal pulses 2+ bilaterally Cardiac: Irregularly irregular  Lungs:  clear to auscultation bilaterally, no wheezing, rhonchi or rales  Abd: soft, nontender, no hepatomegaly  Ext: no edema Musculoskeletal:  No deformities, BUE and BLE strength normal and equal.  Significant Kyphosis Skin: warm and dry  Neuro:  CNs 2-12 intact, no focal abnormalities noted Psych:  Normal affect    EKG:  The ECG that was done and was personally reviewed and demonstrates- EKG shows atrial fibrillation with RVR and abnormal T waves morphology in inferolateral leads. Consistent with prior readings.  Left bundle branch block. No STE   Relevant CV Studies: Echocardiogram 04/22/2021  1. Left ventricular ejection fraction, by estimation, is 55 to 60%. The  left ventricle has normal function. The left ventricle has no regional  wall motion abnormalities. Left ventricular diastolic parameters are  consistent with Grade II diastolic  dysfunction (pseudonormalization). The E/e' is 18.0.   2. Right ventricular systolic function is normal. The right ventricular  size is normal.   3. Left atrial size was moderately dilated.   4. The mitral valve is degenerative. Mild mitral valve regurgitation.   5. The aortic valve is tricuspid. There is moderate calcification of the  aortic valve. There is moderate thickening of the aortic valve. Aortic  valve regurgitation is mild. Mild to moderate aortic valve   sclerosis/calcification is present, without any  evidence of aortic stenosis.   Comparison(s): No significant change from prior study.     Laboratory Data:  High Sensitivity Troponin:   Recent Labs  Lab 10/15/22 1124  TROPONINIHS 5      Chemistry Recent Labs  Lab 10/15/22 1124  NA 133*  K 4.4  CL 99  CO2 20*  GLUCOSE 106*  BUN 9  CREATININE 0.72  CALCIUM 9.1  GFRNONAA >60  ANIONGAP 14    Recent Labs  Lab 10/15/22 1124  PROT 6.5  ALBUMIN 3.9  AST 33  ALT 19  ALKPHOS 46  BILITOT 1.2   Lipids No results for input(s): "CHOL", "TRIG", "HDL", "LABVLDL", "LDLCALC", "CHOLHDL" in the last 168 hours. Hematology Recent Labs  Lab 10/15/22 1124  WBC 8.2  RBC 4.57  HGB 13.0  HCT 40.9  MCV 89.5  MCH 28.4  MCHC 31.8  RDW 15.4  PLT 265   Thyroid No results for input(s): "TSH", "FREET4" in the last 168 hours. BNPNo results for input(s): "BNP", "PROBNP" in the last 168 hours.  DDimer No results for input(s): "DDIMER" in the last 168 hours.   Radiology/Studies:  DG Chest 1 View  Result Date: 10/15/2022 CLINICAL DATA:  Chest pain and shortness of breath. EXAM: CHEST  1 VIEW COMPARISON:  Chest radiograph 09/05/2021. FINDINGS: The heart is enlarged, unchanged. The upper mediastinal contours are within normal limits with calcified plaque in the aortic arch. Asymmetric opacity over the lateral left hemithorax is favored artifactual related to overlying soft tissues and patient rotation. Retrocardiac opacity is consistent with a hiatal hernia as seen on prior CT. There is no convincing acute airspace opacity. There is no pulmonary edema. There is no pleural effusion or pneumothorax There is no acute osseous abnormality. IMPRESSION: 1. No radiographic evidence of acute cardiopulmonary process. 2. Unchanged cardiomegaly and hiatal hernia. Electronically Signed   By: Lesia Hausen M.D.   On: 10/15/2022 10:49     Assessment and Plan:   Chest pain Patient presenting to the  emergency room with first-time episode of severe chest pain with accompanying symptoms of shortness of breath in the setting of heart rates between 100-140.  Patient has prior history of sternal pain that she attributes to her osteoarthritis and has other nonspecific pains that are likely attributing to her current chest pain.  EKG not showing any acute ischemic changes.  Troponins negative at 5. Repeat pending. Given her HPI and concomitant conditions of vague chest pain that she associates with osteoarthritis and atrial fibrillation RVR, I suspect this to be more related to her tachycardia rather than ACS.  Currently denies any chest pain, heart palpitations, shortness of breath.   A-fib RVR with hx of DCCV (unknown) In addition patient has history of A-fib RVR in which she has been on multiple therapy treatments.  She repeatedly converts back to normal sinus rhythm and is currently asymptomatic.  Heart rate at this time generally less than 110  Currently on Multaq 400 mg BID, savaysa 30 mg (patient was previously formulated was on this prior and has been continued, dose appropriate for CrCl 44.53ml/min) Previously on digoxin and metoprolol (did not tolerate due to hypotension and dizziness) and converted to NSR during prior admission. Amiodarone has previously been stopped due to complications with eye deposits.   Chronic HFpEF Hypertension Generally stable.  EF was last noted to be 55 to 60% with grade 2 diastolic dysfunction in December of 2022. Euvolemic on exam.  Wears compression stockings for mild edema.  No significant interference with ADLs.   Hyperlipidemia Continue atorvastatin 20 mg  Risk Assessment/Risk Scores:  New York Heart Association (NYHA) Functional Class NYHA Class II   CHA2DS2-VASc Score = 5  This indicates a 7.2% annual risk of stroke. The patient's score is based upon:     Severity of Illness: The appropriate patient status for  this patient is OBSERVATION.  Observation status is judged to be reasonable and necessary in order to provide the required intensity of service to ensure the patient's safety. The patient's presenting symptoms, physical exam findings, and initial radiographic and laboratory data in the context of their medical condition is felt to place them at decreased risk for further clinical deterioration. Furthermore, it is anticipated that the patient will be medically stable for discharge from the hospital within 2 midnights of admission.    For questions or updates, please contact [facility] Please consult [email or web address] for contact info under     Signed, Abagail Kitchens, PA-C  10/15/2022 4:02 PM

## 2022-10-16 ENCOUNTER — Observation Stay (HOSPITAL_COMMUNITY): Payer: Medicare Other

## 2022-10-16 DIAGNOSIS — I4891 Unspecified atrial fibrillation: Secondary | ICD-10-CM | POA: Diagnosis not present

## 2022-10-16 DIAGNOSIS — I5033 Acute on chronic diastolic (congestive) heart failure: Secondary | ICD-10-CM | POA: Diagnosis not present

## 2022-10-16 LAB — COMPREHENSIVE METABOLIC PANEL
ALT: 17 U/L (ref 0–44)
AST: 19 U/L (ref 15–41)
Albumin: 3.1 g/dL — ABNORMAL LOW (ref 3.5–5.0)
Alkaline Phosphatase: 38 U/L (ref 38–126)
Anion gap: 6 (ref 5–15)
BUN: 10 mg/dL (ref 8–23)
CO2: 27 mmol/L (ref 22–32)
Calcium: 8.4 mg/dL — ABNORMAL LOW (ref 8.9–10.3)
Chloride: 104 mmol/L (ref 98–111)
Creatinine, Ser: 0.83 mg/dL (ref 0.44–1.00)
GFR, Estimated: >60 mL/min (ref >60)
Glucose, Bld: 90 mg/dL (ref 70–99)
Potassium: 3.8 mmol/L (ref 3.5–5.1)
Sodium: 137 mmol/L (ref 135–145)
Total Bilirubin: 0.8 mg/dL (ref 0.3–1.2)
Total Protein: 5.4 g/dL — ABNORMAL LOW (ref 6.5–8.1)

## 2022-10-16 LAB — CBC
HCT: 35.7 % — ABNORMAL LOW (ref 36.0–46.0)
Hemoglobin: 11.2 g/dL — ABNORMAL LOW (ref 12.0–15.0)
MCH: 27.9 pg (ref 26.0–34.0)
MCHC: 31.4 g/dL (ref 30.0–36.0)
MCV: 88.8 fL (ref 80.0–100.0)
Platelets: 261 10*3/uL (ref 150–400)
RBC: 4.02 MIL/uL (ref 3.87–5.11)
RDW: 15.4 % (ref 11.5–15.5)
WBC: 4.8 10*3/uL (ref 4.0–10.5)
nRBC: 0 % (ref 0.0–0.2)

## 2022-10-16 LAB — TSH: TSH: 1.503 u[IU]/mL (ref 0.350–4.500)

## 2022-10-16 MED ORDER — SODIUM CHLORIDE 0.9% FLUSH: 3.0000 mL | INTRAVENOUS | Status: DC | PRN

## 2022-10-16 MED ORDER — FUROSEMIDE 20 MG PO TABS
20.0000 mg | ORAL_TABLET | Freq: Every day | ORAL | Status: DC
  Administered 2022-10-17: 20 mg via ORAL
  Filled 2022-10-16: qty 1

## 2022-10-16 MED ORDER — SODIUM CHLORIDE 0.9 % IV SOLN: 250.0000 mL | INTRAVENOUS | Status: DC | PRN

## 2022-10-16 MED ORDER — SODIUM CHLORIDE 0.9% FLUSH
3.0000 mL | Freq: Two times a day (BID) | INTRAVENOUS | Status: DC
  Administered 2022-10-16 – 2022-10-17 (×2): 3 mL via INTRAVENOUS

## 2022-10-16 MED ORDER — ACETAMINOPHEN 325 MG PO TABS: 650.0000 mg | ORAL_TABLET | ORAL | Status: DC | PRN

## 2022-10-16 MED ORDER — ONDANSETRON HCL 4 MG/2ML IJ SOLN: 4.0000 mg | Freq: Four times a day (QID) | INTRAMUSCULAR | Status: DC | PRN

## 2022-10-16 MED ORDER — FUROSEMIDE 20 MG PO TABS: 20.0000 mg | ORAL_TABLET | ORAL | Status: DC | PRN

## 2022-10-16 NOTE — Care Management Obs Status (Signed)
MEDICARE OBSERVATION STATUS NOTIFICATION   Patient Details  Name: Tammy Walker MRN: [id number] Date of Birth: 28-Jul-1930   Medicare Observation Status Notification Given:  Yes    Gala Lewandowsky, RN 10/16/2022, 4:20 PM

## 2022-10-16 NOTE — Progress Notes (Signed)
   10/16/22 1210  Assess: MEWS Score  Temp 97.6 F (36.4 C)  BP 90/64  MAP (mmHg) 74  Pulse Rate 82  ECG Heart Rate (!) 112  Resp 20  SpO2 95 %  O2 Device Room Air  Assess: MEWS Score  MEWS Temp 0  MEWS Systolic 1  MEWS Pulse 2  MEWS RR 0  MEWS LOC 0  MEWS Score 3  MEWS Score Color Yellow  Assess: if the MEWS score is Yellow or Red  Were vital signs taken at a resting state? Yes  Focused Assessment No change from prior assessment  Does the patient meet 2 or more of the SIRS criteria? No  Does the patient have a confirmed or suspected source of infection? No  MEWS guidelines implemented  Yes, yellow  Treat  MEWS Interventions Considered administering scheduled or prn medications/treatments as ordered  Take Vital Signs  Increase Vital Sign Frequency  Yellow: Q2hr x1, continue Q4hrs until patient remains green for 12hrs  Escalate  MEWS: Escalate Yellow: Discuss with charge nurse and consider notifying provider and/or RRT  Notify: Charge Nurse/RN  Name of Charge Nurse/RN Notified [email or web address]  Provider Notification  Provider Name/Title hoa meng pa  Date Provider Notified 10/16/22  Time Provider Notified 1310  Method of Notification Page  Notification Reason Other (Comment) (mews color change)  Provider response No new orders  Date of Provider Response 10/16/22  Time of Provider Response 1310  Assess: SIRS CRITERIA  SIRS Temperature  0  SIRS Pulse 1  SIRS Respirations  0  SIRS WBC 0  SIRS Score Sum  1

## 2022-10-16 NOTE — Progress Notes (Addendum)
Rounding Note    Patient Name: Tammy Walker Date of Encounter: 10/16/2022  [facility] Cardiologist: Donato Schultz, MD   Subjective   Denies any further chest pain or SOB, daughter at bedside.   Inpatient Medications    Scheduled Meds:  acetaminophen  500 mg Oral TID   atorvastatin  20 mg Oral QPM   bisacodyl  10 mg Oral QHS   calcium-vitamin D  1 tablet Oral BID   dronedarone  400 mg Oral BID WC   edoxaban  30 mg Oral QPM   fluticasone  2 spray Each Nare QPM   lidocaine  1 patch Transdermal Q24H   loratadine  10 mg Oral Daily   oxyCODONE  10 mg Oral Q12H   polyethylene glycol  17 g Oral QHS   pregabalin  25 mg Oral 2 times per day   pregabalin  75 mg Oral Daily   sodium chloride flush  3 mL Intravenous Q12H   Continuous Infusions:  sodium chloride     PRN Meds: sodium chloride, acetaminophen, diclofenac Sodium, ondansetron (ZOFRAN) IV, ondansetron, phenylephrine, sodium chloride flush   Vital Signs    Vitals:   10/16/22 0600 10/16/22 0700 10/16/22 0800 10/16/22 0913  BP: 98/61 101/68 106/69 112/62  Pulse: 85 94 (!) 104 98  Resp: Temp: 98.2 F (36.8 C)   97.6 F (36.4 C)  TempSrc:    Oral  SpO2: 92% 91% 96% 97%   No intake or output data in the 24 hours ending 10/16/22 1042    06/02/2022    1:49 PM 12/26/2021    3:43 PM 10/01/2021    1:32 PM  Last 3 Weights  Weight (lbs) 118 lb 123 lb 121 lb 12.8 oz  Weight (kg) 53.524 kg 55.792 kg 55.248 kg      Telemetry    Atrial fibrillation with HR high 90s to low 100s - Personally Reviewed  ECG    Atrial fibrillation, LBBB - Personally Reviewed  Physical Exam   GEN: No acute distress.   Neck: No JVD Cardiac: irregularly irregular, no murmurs, rubs, or gallops.  Respiratory: Clear to auscultation bilaterally. GI: Soft, nontender, non-distended  MS: No edema; No deformity. Neuro:  Nonfocal  Psych: Normal affect   Labs    High Sensitivity Troponin:   Recent Labs  Lab  10/15/22 1124 10/15/22 1415  TROPONINIHS 5 6     Chemistry Recent Labs  Lab 10/15/22 1124 10/16/22 0330  NA 133* 137  K 4.4 3.8  CL 99 104  CO2 20* 27  GLUCOSE 106* 90  BUN 9 10  CREATININE 0.72 0.83  CALCIUM 9.1 8.4*  PROT 6.5 5.4*  ALBUMIN 3.9 3.1*  AST 33 19  ALT 19 17  ALKPHOS 46 38  BILITOT 1.2 0.8  GFRNONAA >60 >60  ANIONGAP 14 6    Lipids No results for input(s): "CHOL", "TRIG", "HDL", "LABVLDL", "LDLCALC", "CHOLHDL" in the last 168 hours.  Hematology Recent Labs  Lab 10/15/22 1124 10/16/22 0330  WBC 8.2 4.8  RBC 4.57 4.02  HGB 13.0 11.2*  HCT 40.9 35.7*  MCV 89.5 88.8  MCH 28.4 27.9  MCHC 31.8 31.4  RDW 15.4 15.4  PLT 265 261   Thyroid  Recent Labs  Lab 10/16/22 0330  TSH 1.503    BNP Recent Labs  Lab 10/15/22 1124  BNP 501.2*    DDimer No results for input(s): "DDIMER" in the last 168 hours.   Radiology  DG Chest 1 View  Result Date: 10/15/2022 CLINICAL DATA:  Chest pain and shortness of breath. EXAM: CHEST  1 VIEW COMPARISON:  Chest radiograph 09/05/2021. FINDINGS: The heart is enlarged, unchanged. The upper mediastinal contours are within normal limits with calcified plaque in the aortic arch. Asymmetric opacity over the lateral left hemithorax is favored artifactual related to overlying soft tissues and patient rotation. Retrocardiac opacity is consistent with a hiatal hernia as seen on prior CT. There is no convincing acute airspace opacity. There is no pulmonary edema. There is no pleural effusion or pneumothorax There is no acute osseous abnormality. IMPRESSION: 1. No radiographic evidence of acute cardiopulmonary process. 2. Unchanged cardiomegaly and hiatal hernia. Electronically Signed   By: Lesia Hausen M.D.   On: 10/15/2022 10:49    Cardiac Studies   Echo 04/22/2021  1. Left ventricular ejection fraction, by estimation, is 55 to 60%. The  left ventricle has normal function. The left ventricle has no regional  wall motion  abnormalities. Left ventricular diastolic parameters are  consistent with Grade II diastolic  dysfunction (pseudonormalization). The E/e' is 18.0.   2. Right ventricular systolic function is normal. The right ventricular  size is normal.   3. Left atrial size was moderately dilated.   4. The mitral valve is degenerative. Mild mitral valve regurgitation.   5. The aortic valve is tricuspid. There is moderate calcification of the  aortic valve. There is moderate thickening of the aortic valve. Aortic  valve regurgitation is mild. Mild to moderate aortic valve  sclerosis/calcification is present, without any  evidence of aortic stenosis.   Comparison(s): No significant change from prior study.    Patient Profile     89 y.o. female with PMH of PAF, HTN, chronic HFpEF, 1st degree AV block, LBBB, chronic back pain and aortic atherosclerosis who presented with chest pain and afib RVR  Assessment & Plan    Acute on chronic diastolic CHF  - BNP 501, had LE edema, received 2 doses of IV lasix, appears to be euvolemic on exam. Will transition of lasix  PRN  - obtain echocardiogram  Chest pain: improved with pain control, no further chest pain in the past 24 hours. Not related to exertion, given advanced age and atypical symptom, will hold on myoview or invasive study. Pending echocardiogram, if EF normal, will continue medical therapy. Repeat morning EKG to make sure no ST changes (somewhat drastic ST change on tele, related to underlying LBBB?)  PAF with RVR - on Multaq and Savaysa. H/o hypotension on metoprolol. Previously considering restarting digoxin for rate control, discussed with Dr. Duke Salvia, given her age and somewhat borderline controlled HR, will hold off on restarting lasix  HLD  For questions or updates, please contact [facility] Please consult [email or web address] for contact info under        Signed, Azalee Course, PA  10/16/2022, 10:42 AM

## 2022-10-16 NOTE — ED Notes (Signed)
ED TO INPATIENT HANDOFF REPORT  ED Nurse Name and Phone #:  Theophilus Bones [phone]  S Name/Age/Gender Tammy Walker 89 y.o. female Room/Bed: 039C/039C  Code Status   Code Status: Full Code  Home/SNF/Other Home Patient oriented to: self, place, time, and situation Is this baseline? Yes   Triage Complete: Triage complete  Chief Complaint Chest pain [R07.9]  Triage Note Pt came in via POV d/t CP that began last night & it radiates all the way around her back. States she has osteoarthritis & Hx of breast bone hurting but last night she states it became unbearable & she had to take deep breaths to bear through it. A/Ox4, rates pain 9/10. Daughter (POA) at bedside & states her cardiologist knows that she is here. Tachycardia noted while in triage.      Allergies No Known Allergies  Level of Care/Admitting Diagnosis ED Disposition     ED Disposition  Admit   Condition  --   Comment  Hospital Area: [facility] [[id number]]  Level of Care: Telemetry Cardiac [[id number]]  May place patient in observation at [facility] or [facility] if equivalent level of care is available:: No  Covid Evaluation: Asymptomatic - no recent exposure (last 10 days) testing not required  Diagnosis: Chest pain [147829]  Admitting Physician: Chilton Si [[id number]]  Attending Physician: Chilton Si [[id number]]          B Medical/Surgery History Past Medical History:  Diagnosis Date   Allergy    Arrhythmia    Atrial fibrillation    GERD (gastroesophageal reflux disease)    History of bone scan    Bone Density Scans 2017 & 2019 Dr. Dutch Quint    History of chest x-ray    Apart from large hiatus hernia, normal heart, lungs and mediastinum. Per records from [facility]     History of CT scan of abdomen 10/24/2017   Gallstones within a thin-waled gallbladder, Per records from [facility]    History of echocardiogram    [facility] &  [facility]     History of EKG    Sinus rhythm, borderline 1st deg block, LAD completely changed axis, LBBB(old). Per records from [facility]    Hyperlipidemia    Hypertension    Insomnia    Left lower lobe pneumonia 09/20/2015   Per records from [facility]    Nodule of right lung    Per records from [facility],    Osteoporosis    on fosamax   Osteoporosis    Reduced mobility    Past Surgical History:  Procedure Laterality Date   APPENDECTOMY  [address] [place]    CT SCAN     2017, 2018, 2019 -- re lungs see records    TUBAL LIGATION  1978   [facility] [place], [place]      A IV Location/Drains/Wounds Patient Lines/Drains/Airways Status     Active Line/Drains/Airways     Name Placement date Placement time Site Days   Peripheral IV 10/15/22 22 G Posterior;Right Hand 10/15/22  1051  Hand  1            Intake/Output Last 24 hours No intake or output data in the 24 hours ending 10/16/22 0708  Labs/Imaging Results for orders placed or performed during the hospital encounter of 10/15/22 (from the past 48 hour(s))  Comprehensive metabolic panel     Status: Abnormal   Collection Time: 10/15/22 11:24 AM  Result Value Ref Range   Sodium 133 (L) 135 - 145 mmol/L   Potassium 4.4 3.5 - 5.1 mmol/L   Chloride 99 98 - 111 mmol/L   CO2 20 (L) 22 - 32 mmol/L   Glucose, Bld 106 (H) 70 - 99 mg/dL    Comment: Glucose reference range applies only to samples taken after fasting for at least 8 hours.   BUN 9 8 - 23 mg/dL   Creatinine, Ser 9.14 0.44 - 1.00 mg/dL   Calcium 9.1 8.9 - 78.2 mg/dL   Total Protein 6.5 6.5 - 8.1 g/dL   Albumin 3.9 3.5 - 5.0 g/dL   AST 33 15 - 41 U/L   ALT 19 0 - 44 U/L   Alkaline Phosphatase 46 38 - 126 U/L   Total Bilirubin 1.2 0.3 - 1.2 mg/dL   GFR, Estimated >95 >62 mL/min    Comment: (NOTE) Calculated using the CKD-EPI Creatinine Equation (2021)    Anion gap 14 5 - 15    Comment: Performed at  [facility], [address], [place], [place] [address]  Troponin I (High Sensitivity)     Status: None   Collection Time: 10/15/22 11:24 AM  Result Value Ref Range   Troponin I (High Sensitivity) 5 <18 ng/L    Comment: (NOTE) Elevated high sensitivity troponin I (hsTnI) values and significant  changes across serial measurements may suggest ACS but many other  chronic and acute conditions are known to elevate hsTnI results.  Refer to the "Links" section for chest pain algorithms and additional  guidance. Performed at [facility], [address], [place], [place] [address]   CBC with Differential     Status: None   Collection Time: 10/15/22 11:24 AM  Result Value Ref Range   WBC 8.2 4.0 - 10.5 K/uL   RBC 4.57 3.87 - 5.11 MIL/uL   Hemoglobin 13.0 12.0 - 15.0 g/dL   HCT 96.2 95.2 - 84.1 %   MCV 89.5 80.0 - 100.0 fL   MCH 28.4 26.0 - 34.0 pg   MCHC 31.8 30.0 - 36.0 g/dL   RDW 32.4 40.1 - 02.7 %   Platelets 265 150 - 400 K/uL   nRBC 0.0 0.0 - 0.2 %   Neutrophils Relative % 68 %   Neutro Abs 5.7 1.7 - 7.7 K/uL   Lymphocytes Relative 24 %   Lymphs Abs 1.9 0.7 - 4.0 K/uL   Monocytes Relative 6 %   Monocytes Absolute 0.5 0.1 - 1.0 K/uL   Eosinophils Relative 1 %   Eosinophils Absolute 0.0 0.0 - 0.5 K/uL   Basophils Relative 1 %   Basophils Absolute 0.1 0.0 - 0.1 K/uL   Immature Granulocytes 0 %   Abs Immature Granulocytes 0.03 0.00 - 0.07 K/uL    Comment: Performed at [facility], [address], [place], [place] [address]  Brain natriuretic peptide     Status: Abnormal   Collection Time: 10/15/22 11:24 AM  Result Value Ref Range   B Natriuretic Peptide 501.2 (H) 0.0 - 100.0 pg/mL    Comment: Performed at [facility], [address], [place], [place] [address]  Troponin I (High Sensitivity)     Status: None   Collection Time: 10/15/22  2:15 PM  Result Value Ref Range   Troponin I (High Sensitivity) 6 <18 ng/L    Comment: (NOTE) Elevated high  sensitivity troponin I (hsTnI) values and significant  changes across serial measurements may  suggest ACS but many other  chronic and acute conditions are known to elevate hsTnI results.  Refer to the "Links" section for chest pain algorithms and additional  guidance. Performed at [facility], [address] [place], [place] [address]   TSH     Status: None   Collection Time: 10/16/22  3:30 AM  Result Value Ref Range   TSH 1.503 0.350 - 4.500 uIU/mL    Comment: Performed by a 3rd Generation assay with a functional sensitivity of <=0.01 uIU/mL. Performed at [facility], [address], [place], [place] [address]   Comprehensive metabolic panel     Status: Abnormal   Collection Time: 10/16/22  3:30 AM  Result Value Ref Range   Sodium 137 135 - 145 mmol/L   Potassium 3.8 3.5 - 5.1 mmol/L   Chloride 104 98 - 111 mmol/L   CO2 27 22 - 32 mmol/L   Glucose, Bld 90 70 - 99 mg/dL    Comment: Glucose reference range applies only to samples taken after fasting for at least 8 hours.   BUN 10 8 - 23 mg/dL   Creatinine, Ser 8.65 0.44 - 1.00 mg/dL   Calcium 8.4 (L) 8.9 - 10.3 mg/dL   Total Protein 5.4 (L) 6.5 - 8.1 g/dL   Albumin 3.1 (L) 3.5 - 5.0 g/dL   AST 19 15 - 41 U/L   ALT 17 0 - 44 U/L   Alkaline Phosphatase 38 38 - 126 U/L   Total Bilirubin 0.8 0.3 - 1.2 mg/dL   GFR, Estimated >78 >46 mL/min    Comment: (NOTE) Calculated using the CKD-EPI Creatinine Equation (2021)    Anion gap 6 5 - 15    Comment: Performed at [facility], [address], [place], [place] [address]  CBC     Status: Abnormal   Collection Time: 10/16/22  3:30 AM  Result Value Ref Range   WBC 4.8 4.0 - 10.5 K/uL   RBC 4.02 3.87 - 5.11 MIL/uL   Hemoglobin 11.2 (L) 12.0 - 15.0 g/dL   HCT 28.4 (L) 13.2 - 44.0 %   MCV 88.8 80.0 - 100.0 fL   MCH 27.9 26.0 - 34.0 pg   MCHC 31.4 30.0 - 36.0 g/dL   RDW 10.2 72.5 - 36.6 %   Platelets 261 150 - 400 K/uL   nRBC 0.0 0.0 - 0.2 %    Comment:  Performed at [facility], [address] [place], [place] [address]   DG Chest 1 View  Result Date: 10/15/2022 CLINICAL DATA:  Chest pain and shortness of breath. EXAM: CHEST  1 VIEW COMPARISON:  Chest radiograph 09/05/2021. FINDINGS: The heart is enlarged, unchanged. The upper mediastinal contours are within normal limits with calcified plaque in the aortic arch. Asymmetric opacity over the lateral left hemithorax is favored artifactual related to overlying soft tissues and patient rotation. Retrocardiac opacity is consistent with a hiatal hernia as seen on prior CT. There is no convincing acute airspace opacity. There is no pulmonary edema. There is no pleural effusion or pneumothorax There is no acute osseous abnormality. IMPRESSION: 1. No radiographic evidence of acute cardiopulmonary process. 2. Unchanged cardiomegaly and hiatal hernia. Electronically Signed   By: Lesia Hausen M.D.   On: 10/15/2022 10:49    Pending Labs Unresulted Labs (From admission, onward)     Start     Ordered   10/16/22 0400  Lipoprotein A (LPA)  Once,   R  10/16/22 0400            Vitals/Pain Today's Vitals   10/16/22 0430 10/16/22 0505 10/16/22 0600 10/16/22 0622  BP:  102/62 98/61   Pulse: 93 (!) 102 85   Resp: (!) 6 17 18    Temp:  98.2 F (36.8 C) 98.2 F (36.8 C)   TempSrc:      SpO2: 91% 90% 92%   PainSc:    0-No pain    Isolation Precautions No active isolations  Medications Medications  furosemide (LASIX) injection 20 mg (20 mg Intravenous Given 10/15/22 1755)  acetaminophen (TYLENOL) tablet 650 mg (has no administration in time range)  ondansetron (ZOFRAN) injection 4 mg (has no administration in time range)  sodium chloride flush (NS) 0.9 % injection 3 mL (3 mLs Intravenous Given 10/16/22 0237)  sodium chloride flush (NS) 0.9 % injection 3 mL (has no administration in time range)  0.9 %  sodium chloride infusion (has no administration in time range)  lidocaine (LIDODERM) 5  % 1 patch (1 patch Transdermal Patch Applied 10/15/22 1646)  acetaminophen (TYLENOL) tablet 500 mg (500 mg Oral Not Given 10/15/22 2250)  atorvastatin (LIPITOR) tablet 20 mg (20 mg Oral Not Given 10/15/22 2249)  bisacodyl (DULCOLAX) EC tablet 10 mg (10 mg Oral Not Given 10/15/22 2245)  calcium-vitamin D (OSCAL WITH D) 500-5 MG-MCG per tablet 1 tablet (1 tablet Oral Not Given 10/15/22 2246)  loratadine (CLARITIN) tablet 10 mg (has no administration in time range)  diclofenac Sodium (VOLTAREN) 1 % topical gel 4 g (has no administration in time range)  edoxaban (SAVAYSA) tablet 30 mg (30 mg Oral Not Given 10/15/22 2241)  dronedarone (MULTAQ) tablet 400 mg (400 mg Oral Not Given 10/15/22 2246)  fluticasone (FLONASE) 50 MCG/ACT nasal spray 2 spray (2 sprays Each Nare Patient Refused/Not Given 10/15/22 2246)  ondansetron (ZOFRAN) tablet 4 mg (has no administration in time range)  polyethylene glycol (MIRALAX / GLYCOLAX) packet 17 g (17 g Oral Patient Refused/Not Given 10/15/22 2250)  phenylephrine ((USE for PREPARATION-H)) 0.25 % suppository 1 suppository (has no administration in time range)  oxyCODONE (OXYCONTIN) 12 hr tablet 10 mg (10 mg Oral Not Given 10/15/22 2249)  pregabalin (LYRICA) capsule 75 mg (has no administration in time range)  pregabalin (LYRICA) capsule 25 mg (has no administration in time range)  morphine (PF) 4 MG/ML injection 4 mg (4 mg Intravenous Given 10/15/22 1235)    Mobility walks     Focused Assessments Cardiac Assessment Handoff:  Cardiac Rhythm: Atrial fibrillation No results found for: "CKTOTAL", "CKMB", "CKMBINDEX", "TROPONINI" Lab Results  Component Value Date   DDIMER 0.46 09/23/2020   Does the Patient currently have chest pain? No    R Recommendations: See Admitting Provider Note  Report given to:   Additional Notes:  pt HOH

## 2022-10-16 NOTE — Care Management (Signed)
  Transition of Care [facility]) Screening Note   Patient Details  Name: Tammy Walker Date of Birth: 11-26-1930   Transition of Care [facility]) CM/SW Contact:    Gala Lewandowsky, RN Phone Number: 10/16/2022, 4:20 PM    Transition of Care Department [facility]) has reviewed the patient and no TOC needs have been identified at this time. Patient presented for chest pain. PTA patient was from home with the support of daughter. We will continue to monitor patient advancement through interdisciplinary progression rounds. If new patient transition needs arise, please place a TOC consult.

## 2022-10-17 ENCOUNTER — Other Ambulatory Visit: Payer: Self-pay | Admitting: Physician Assistant

## 2022-10-17 ENCOUNTER — Observation Stay (HOSPITAL_COMMUNITY): Payer: Medicare Other

## 2022-10-17 DIAGNOSIS — I130 Hypertensive heart and chronic kidney disease with heart failure and stage 1 through stage 4 chronic kidney disease, or unspecified chronic kidney disease: Secondary | ICD-10-CM | POA: Diagnosis present

## 2022-10-17 DIAGNOSIS — I480 Paroxysmal atrial fibrillation: Secondary | ICD-10-CM | POA: Diagnosis present

## 2022-10-17 DIAGNOSIS — I4891 Unspecified atrial fibrillation: Secondary | ICD-10-CM | POA: Diagnosis present

## 2022-10-17 DIAGNOSIS — G4700 Insomnia, unspecified: Secondary | ICD-10-CM | POA: Diagnosis present

## 2022-10-17 DIAGNOSIS — I5043 Acute on chronic combined systolic (congestive) and diastolic (congestive) heart failure: Secondary | ICD-10-CM | POA: Diagnosis present

## 2022-10-17 DIAGNOSIS — I440 Atrioventricular block, first degree: Secondary | ICD-10-CM | POA: Diagnosis present

## 2022-10-17 DIAGNOSIS — I700 Atherosclerosis of aorta: Secondary | ICD-10-CM | POA: Diagnosis present

## 2022-10-17 DIAGNOSIS — R0609 Other forms of dyspnea: Secondary | ICD-10-CM

## 2022-10-17 DIAGNOSIS — I4819 Other persistent atrial fibrillation: Secondary | ICD-10-CM | POA: Diagnosis present

## 2022-10-17 DIAGNOSIS — K219 Gastro-esophageal reflux disease without esophagitis: Secondary | ICD-10-CM | POA: Diagnosis present

## 2022-10-17 DIAGNOSIS — Z825 Family history of asthma and other chronic lower respiratory diseases: Secondary | ICD-10-CM | POA: Diagnosis not present

## 2022-10-17 DIAGNOSIS — E785 Hyperlipidemia, unspecified: Secondary | ICD-10-CM | POA: Diagnosis present

## 2022-10-17 DIAGNOSIS — I5032 Chronic diastolic (congestive) heart failure: Secondary | ICD-10-CM

## 2022-10-17 DIAGNOSIS — N182 Chronic kidney disease, stage 2 (mild): Secondary | ICD-10-CM | POA: Diagnosis present

## 2022-10-17 DIAGNOSIS — I5022 Chronic systolic (congestive) heart failure: Secondary | ICD-10-CM

## 2022-10-17 DIAGNOSIS — Z8249 Family history of ischemic heart disease and other diseases of the circulatory system: Secondary | ICD-10-CM | POA: Diagnosis not present

## 2022-10-17 DIAGNOSIS — I447 Left bundle-branch block, unspecified: Secondary | ICD-10-CM | POA: Diagnosis present

## 2022-10-17 DIAGNOSIS — G8929 Other chronic pain: Secondary | ICD-10-CM | POA: Diagnosis present

## 2022-10-17 DIAGNOSIS — Z79899 Other long term (current) drug therapy: Secondary | ICD-10-CM | POA: Diagnosis not present

## 2022-10-17 DIAGNOSIS — M419 Scoliosis, unspecified: Secondary | ICD-10-CM | POA: Diagnosis present

## 2022-10-17 LAB — ECHOCARDIOGRAM COMPLETE
AR max vel: 1.53 cm2
AV Area VTI: 1.54 cm2
AV Area mean vel: 1.45 cm2
AV Mean grad: 6.3 mmHg
AV Peak grad: 10.3 mmHg
Ao pk vel: 1.6 m/s
Area-P 1/2: 4.89 cm2
Calc EF: 39.8 %
MV M vel: 5.1 m/s
MV Peak grad: 104 mmHg
P 1/2 time: 604 msec
Radius: 0.6 cm
S' Lateral: 2.7 cm
Single Plane A2C EF: 33.7 %
Single Plane A4C EF: 43 %
Weight: 1928 oz

## 2022-10-17 LAB — LIPOPROTEIN A (LPA): Lipoprotein (a): 232.6 nmol/L — ABNORMAL HIGH (ref <75.0)

## 2022-10-17 MED ORDER — FUROSEMIDE 20 MG PO TABS: 20.0000 mg | ORAL_TABLET | Freq: Every day | ORAL | 3 refills | Status: DC

## 2022-10-17 NOTE — Discharge Summary (Signed)
Discharge Summary    Patient ID: Tammy Walker MRN: [id number]; DOB: [birth year]  Admit date: 10/15/2022 Discharge date: 10/17/2022  PCP:  Tammy Lund, PA   [facility] Providers Cardiologist:  Tammy Schultz, MD        Discharge Diagnoses    Principal Problem:   Chest pain Active Problems:   Atrial fibrillation with RVR   Compression fracture of thoracic vertebra   Radicular pain   Acute on chronic heart failure with preserved ejection fraction (HFpEF)   Heart failure with mildly reduced ejection fraction (HFmrEF)   Paroxysmal atrial fibrillation    Diagnostic Studies/Procedures    Echo 10/17/2022  1. Left ventricular ejection fraction, by estimation, is 45 to 50%. The  left ventricle has mildly decreased function. The left ventricle  demonstrates regional wall motion abnormalities with septal hypokinesis  and septal-lateral dyssynchrony consistent  with LBBB. There is moderate asymmetric left ventricular hypertrophy of  the basal-septal segment. Left ventricular diastolic parameters are  indeterminate.   2. Right ventricular systolic function is normal. The right ventricular  size is normal. There is moderately elevated pulmonary artery systolic  pressure. The estimated right ventricular systolic pressure is 45.7 mmHg.   3. Left atrial size was severely dilated.   4. Right atrial size was severely dilated.   5. The mitral valve is abnormal. 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 No evidence of  mitral stenosis.   6. Tricuspid valve regurgitation is moderate.   7. The aortic valve is tricuspid. There is moderate calcification of the  aortic valve. Aortic valve regurgitation is mild. Aortic valve  sclerosis/calcification is present, without any evidence of aortic  stenosis. Aortic valve mean gradient measures 6.2  mmHg.   8. Aortic dilatation noted. There is mild dilatation of the descending  aorta,  measuring 39 mm.   9. The inferior vena cava is normal in size with <50% respiratory  variability, suggesting right atrial pressure of 8 mmHg.  10. The patient appeared to be in atrial fibrillation.   _____________   History of Present Illness     Tammy Walker is a 89 y.o. 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   Tammy Walker has history of paroxysmal atrial fibrillation with RVR that has dated all the way back in 2002.  Currently she is being seen by Tammy Walker and was last seen in December 2023 but has also seen Tammy Walker in EP for her afib.  Patient has had multiple different treatment regimens including long-term amiodarone for years at 200 mg a day that was stopped due to eye deposits seen by ophthalmology in 2021. Her A-fib returned back in 2022 and she was hospitalized and started on digoxin and metoprolol and converted back to NSR then was seen by Tammy Walker for follow up. Metoprolol was then stopped in May 2023 due to dizziness, hypotension.  Now patient is on Multaq that was started in March 2023 during hospital admission for SBO.  Generally patient is asymptomatic and does not feel her atrial fibrillation. Additionally at some point patient has had cardioversion at some point however unknown when that was. Last echocardiogram was in October 2022 that revealed a normal EF of 55 to  60%, grade 2 diastolic dysfunction.  She is typically euvolemic and has not required diuretics. No prior ischemic evaluations.   Earlier this morning patient's daughter called outpatient call line with complaints of elevated heart rate 100-100 20s this morning and systolic BP of 140.  During that call her daughter did not note any anginal symptoms or shortness of breath at that time.  Now she is  presenting to the emergency room with complaints of shortness of breath and chest pain that started last night around midnight and woke her up from her sleep this morning around 0500 with an elevated heart rate of around 140. Patient states that she has had recurrent pain around her sternum for many years since being diagnosed with osteoarthritis that is intermittent.  However she said last night her pain became more intense rating it 9/10 and started feeling heaviness that was wrapping around her chest with accompanied shortness of breath.  Generally her sternum pain has no pattern to it and is not exacerbated by activity and generally lasts less than an hour.  Sometimes patient will warm up a water bottle and finds relief with that.  No history of acid reflux.  No pain with palpation and not reproducible.  No recent infections, denies any fever, cough. She received morphine in the ED. She is pain free with HR low 100s.  ED workup: EKG shows atrial fibrillation with RVR and abnormal T waves morphology in inferolateral leads. Consistent with prior readings.  Left bundle branch block. No STE. Neg CX.  First troponin negative.  No other significant labs or imaging.   Hospital Course     Consultants: N/A   Patient was admitted to cardiology service.  Her chest discomfort does not occur with physical exertion and she has a lot of back pain due to scoliosis.  She was also found to be volume overloaded.  The combination of volume overload and A-fib with RVR likely contributed to her symptoms.  Suspicion for ACS fairly low.  Her BNP was elevated at 500.  She was given 2 doses of IV Lasix with good urinary output.  She was left on Multaq and Savaysa.  She was previously on digoxin and metoprolol, she could not tolerate metoprolol due to hypotension and dizziness.  Amiodarone was previously stopped due to complication with eye deposits.  Her blood pressure has been too low to add any rate control medication.   Fortunately, her heart rate has been holding right around 100 bpm which is okay in consideration of her age.  Her chest pain has not recurred after she left the emergency room.  Echocardiogram obtained on 10/17/2022 demonstrated EF 45 to 50%, septal hypokinesis with septal lateral dyssynchrony consistent with LBBB, moderate asymmetric LVH of basal septal segment, RV systolic pressure 45.7 mmHg, severe biatrial enlargement, moderate to severe MR, moderate TR.  Echocardiogram was reviewed with the patient and her daughter.  Given her age, both of them has agreed to avoid any aggressive workup.  She will be discharged on 20 mg daily of oral Lasix.  She is currently scheduled to see A-fib clinic next week, if she remains in A-fib, would recommend repeat cardioversion and considering alternative antiarrhythmic therapy.      Did the patient have an acute coronary syndrome (MI, NSTEMI, STEMI, etc) this admission?:  No                               Did  the patient have a percutaneous coronary intervention (stent / angioplasty)?:  No.          _____________  Discharge Vitals Blood pressure 110/73, pulse 98, temperature 98.1 F (36.7 C), temperature source Oral, resp. rate 16, weight 54.7 kg, SpO2 98 %.  Filed Weights   10/17/22 0416  Weight: 54.7 kg    Labs & Radiologic Studies    CBC Recent Labs    10/15/22 1124 10/16/22 0330  WBC 8.2 4.8  NEUTROABS 5.7  --   HGB 13.0 11.2*  HCT 40.9 35.7*  MCV 89.5 88.8  PLT 265 261   Basic Metabolic Panel Recent Labs    16/10/96 1124 10/16/22 0330  NA 133* 137  K 4.4 3.8  CL 99 104  CO2 20* 27  GLUCOSE 106* 90  BUN 9 10  CREATININE 0.72 0.83  CALCIUM 9.1 8.4*   Liver Function Tests Recent Labs    10/15/22 1124 10/16/22 0330  AST 33 19  ALT 19 17  ALKPHOS 46 38  BILITOT 1.2 0.8  PROT 6.5 5.4*  ALBUMIN 3.9 3.1*   No results for input(s): "LIPASE", "AMYLASE" in the last 72 hours. High Sensitivity Troponin:   Recent Labs  Lab  10/15/22 1124 10/15/22 1415  TROPONINIHS 5 6    BNP Invalid input(s): "POCBNP" D-Dimer No results for input(s): "DDIMER" in the last 72 hours. Hemoglobin A1C No results for input(s): "HGBA1C" in the last 72 hours. Fasting Lipid Panel No results for input(s): "CHOL", "HDL", "LDLCALC", "TRIG", "CHOLHDL", "LDLDIRECT" in the last 72 hours. Thyroid Function Tests Recent Labs    10/16/22 0330  TSH 1.503   _____________  ECHOCARDIOGRAM COMPLETE  Result Date: 10/17/2022    ECHOCARDIOGRAM REPORT   Patient Name:   Tammy Walker Date of Exam: 10/17/2022 Medical Rec #:  [id number]      Height:       60.0 in Accession #:    [id number]     Weight:       120.5 lb Date of Birth:  [birth year]      BSA:          1.505 m Patient Age:    89 years       BP:           110/73 mmHg Patient Gender: F              HR:           115 bpm. Exam Location:  Inpatient Procedure: 2D Echo, Cardiac Doppler and Color Doppler Indications:    Dyspnea  History:        Patient has prior history of Echocardiogram examinations, most                 recent 04/22/2021. Aortic Valve Disease, Arrythmias:Atrial                 Fibrillation; Risk Factors:Hypertension.  Sonographer:    Wallie Char Referring Phys: [phone] Shadoe Cryan IMPRESSIONS  1. Left ventricular ejection fraction, by estimation, is 45 to 50%. The left ventricle has mildly decreased function. The left ventricle demonstrates regional wall motion abnormalities with septal hypokinesis and septal-lateral dyssynchrony consistent with LBBB. There is moderate asymmetric left ventricular hypertrophy of the basal-septal segment. Left ventricular diastolic parameters are indeterminate.  2. Right ventricular systolic function is normal. The right ventricular size is normal. There is moderately elevated pulmonary artery systolic pressure. The estimated right ventricular systolic pressure is 45.7 mmHg.  3. Left atrial size  was severely dilated.  4. Right atrial size was severely dilated.   5. The mitral valve is abnormal. Moderate to severe mitral valve regurgitation, likely atrial functional MR with dilated LA. No evidence of mitral stenosis.  6. Tricuspid valve regurgitation is moderate.  7. The aortic valve is tricuspid. There is moderate calcification of the aortic valve. Aortic valve regurgitation is mild. Aortic valve sclerosis/calcification is present, without any evidence of aortic stenosis. Aortic valve mean gradient measures 6.2 mmHg.  8. Aortic dilatation noted. There is mild dilatation of the descending aorta, measuring 39 mm.  9. The inferior vena cava is normal in size with <50% respiratory variability, suggesting right atrial pressure of 8 mmHg. 10. The patient appeared to be in atrial fibrillation. FINDINGS  Left Ventricle: Left ventricular ejection fraction, by estimation, is 45 to 50%. The left ventricle has mildly decreased function. The left ventricle demonstrates regional wall motion abnormalities. The left ventricular internal cavity size was normal in size. There is moderate asymmetric left ventricular hypertrophy of the basal-septal segment. Left ventricular diastolic parameters are indeterminate. Right Ventricle: The right ventricular size is normal. No increase in right ventricular wall thickness. Right ventricular systolic function is normal. There is moderately elevated pulmonary artery systolic pressure. The tricuspid regurgitant velocity is 3.07 m/s, and with an assumed right atrial pressure of 8 mmHg, the estimated right ventricular systolic pressure is 45.7 mmHg. Left Atrium: Left atrial size was severely dilated. Right Atrium: Right atrial size was severely dilated. Pericardium: There is no evidence of pericardial effusion. Mitral Valve: The mitral valve is abnormal. Mild mitral annular calcification. Moderate to severe mitral valve regurgitation. No evidence of mitral valve stenosis. MV peak gradient, 5.6 mmHg. The mean mitral valve gradient is 2.0 mmHg. Tricuspid  Valve: The tricuspid valve is normal in structure. Tricuspid valve regurgitation is moderate. Aortic Valve: The aortic valve is tricuspid. There is moderate calcification of the aortic valve. Aortic valve regurgitation is mild. Aortic regurgitation PHT measures 604 msec. Aortic valve sclerosis/calcification is present, without any evidence of aortic stenosis. Aortic valve mean gradient measures 6.2 mmHg. Aortic valve peak gradient measures 10.3 mmHg. Aortic valve area, by VTI measures 1.54 cm. Pulmonic Valve: The pulmonic valve was normal in structure. Pulmonic valve regurgitation is mild. Aorta: The aortic root is normal in size and structure and aortic dilatation noted. There is mild dilatation of the descending aorta, measuring 39 mm. Venous: The inferior vena cava is normal in size with less than 50% respiratory variability, suggesting right atrial pressure of 8 mmHg. IAS/Shunts: No atrial level shunt detected by color flow Doppler.  LEFT VENTRICLE PLAX 2D LVIDd:         3.40 cm     Diastology LVIDs:         2.70 cm     LV e' medial:    6.11 cm/s LV PW:         1.00 cm     LV E/e' medial:  16.7 LV IVS:        1.30 cm     LV e' lateral:   11.13 cm/s LVOT diam:     2.10 cm     LV E/e' lateral: 9.2 LV SV:         40 LV SV Index:   26 LVOT Area:     3.46 cm  LV Volumes (MOD) LV vol d, MOD A2C: 55.2 ml LV vol d, MOD A4C: 56.8 ml LV vol s, MOD A2C: 36.6 ml LV vol s,  MOD A4C: 32.4 ml LV SV MOD A2C:     18.6 ml LV SV MOD A4C:     56.8 ml LV SV MOD BP:      23.5 ml RIGHT VENTRICLE             IVC RV Basal diam:  4.00 cm     IVC diam: 1.50 cm RV S prime:     10.19 cm/s TAPSE (M-mode): 1.6 cm LEFT ATRIUM             Index        RIGHT ATRIUM           Index LA diam:        4.60 cm 3.06 cm/m   RA Area:     26.40 cm LA Vol (A2C):   76.6 ml 50.90 ml/m  RA Volume:   86.40 ml  57.41 ml/m LA Vol (A4C):   88.2 ml 58.61 ml/m LA Biplane Vol: 82.6 ml 54.88 ml/m  AORTIC VALVE                     PULMONIC VALVE AV Area (Vmax):     1.53 cm      PR End Diast Vel: 8.24 msec AV Area (Vmean):   1.45 cm AV Area (VTI):     1.54 cm AV Vmax:           160.25 cm/s AV Vmean:          114.750 cm/s AV VTI:            0.258 m AV Peak Grad:      10.3 mmHg AV Mean Grad:      6.2 mmHg LVOT Vmax:         70.88 cm/s LVOT Vmean:        48.150 cm/s LVOT VTI:          0.114 m LVOT/AV VTI ratio: 0.44 AI PHT:            604 msec  AORTA Ao Root diam: 3.40 cm Ao Asc diam:  3.90 cm MITRAL VALVE                  TRICUSPID VALVE MV Area (PHT): 4.89 cm       TR Peak grad:   37.7 mmHg MV Peak grad:  5.6 mmHg       TR Mean grad:   19.0 mmHg MV Mean grad:  2.0 mmHg       TR Vmax:        307.00 cm/s MV Vmax:       1.18 m/s       TR Vmean:       202.0 cm/s MV Vmean:      71.0 cm/s MV Decel Time: 155 msec       SHUNTS MR Peak grad:    104.0 mmHg   Systemic VTI:  0.11 m MR Mean grad:    68.0 mmHg    Systemic Diam: 2.10 cm MR Vmax:         510.00 cm/s MR Vmean:        390.0 cm/s MR PISA:         2.26 cm MR PISA Eff ROA: 14 mm MR PISA Radius:  0.60 cm MV E velocity: 102.00 cm/s Dalton McleanMD Electronically signed by Wilfred Lacy Signature Date/Time: 10/17/2022/11:36:28 AM    Final    DG Chest 1 View  Result Date: 10/15/2022 CLINICAL DATA:  Chest pain and shortness of breath. EXAM: CHEST  1 VIEW COMPARISON:  Chest radiograph 09/05/2021. FINDINGS: The heart is enlarged, unchanged. The upper mediastinal contours are within normal limits with calcified plaque in the aortic arch. Asymmetric opacity over the lateral left hemithorax is favored artifactual related to overlying soft tissues and patient rotation. Retrocardiac opacity is consistent with a hiatal hernia as seen on prior CT. There is no convincing acute airspace opacity. There is no pulmonary edema. There is no pleural effusion or pneumothorax There is no acute osseous abnormality. IMPRESSION: 1. No radiographic evidence of acute cardiopulmonary process. 2. Unchanged cardiomegaly and hiatal hernia.  Electronically Signed   By: Lesia Hausen M.D.   On: 10/15/2022 10:49   Disposition   Pt is being discharged home today in good condition.  Follow-up Plans & Appointments     Follow-up Information     [facility] Follow up on 10/20/2022.   Specialty: Cardiology Why: 10:30AM. Afib clinic follow up Contact information: [address] [id number] mc [address] [place] [address] [phone]        [facility] Follow up on 10/22/2022.   Specialty: Cardiology Why: please obtain BMET blood work in 5-7 days after discharge to make sure renal function is stable. This is a nonfasting blood work, lab opens from [employer] until [employer]. Closed during lunch. Contact information: [address] [id number] mc [place] [address] [phone]        Levi Aland, NP Follow up on 12/03/2022.   Specialty: Cardiology Why: 1:30PM. Cardiology follow up Contact information: [address] [place] [address] [phone]                Discharge Instructions     Diet - low sodium heart healthy   Complete by: As directed    Increase activity slowly   Complete by: As directed         Discharge Medications   Allergies as of 10/17/2022   No Known Allergies      Medication List     TAKE these medications    acetaminophen 500 MG tablet Commonly known as: TYLENOL Take 500 mg by mouth 3 (three) times daily.   atorvastatin 20 MG tablet Commonly known as: LIPITOR TAKE 1 TABLET(20 MG) BY MOUTH AT BEDTIME   bisacodyl 5 MG EC tablet Commonly known as: DULCOLAX Take 10 mg by mouth at bedtime.   calcium-vitamin D 500-200 MG-UNIT tablet Commonly known as: OSCAL WITH D Take 1 tablet by mouth 2 (two) times daily.   CALCIUM/D3 ADULT GUMMIES PO Take 1 tablet by mouth 4 (four) times daily. Calcium 200 mg/ Vitamin D3 500 I.U. - Vitafusion   cetirizine  10 MG tablet Commonly known as: ZYRTEC Take 10 mg by mouth every morning.   clotrimazole 1 % cream Commonly known as: LOTRIMIN Apply 1 application. topically 2 (two) times daily as needed (fungus).   denosumab 60 MG/ML Sosy injection Commonly known as: PROLIA Inject 60 mg into the skin every 6 (six) months.   diclofenac Sodium 1 % Gel Commonly known as: VOLTAREN Apply 4 g topically 4 (four) times daily as needed (back pain).   dronedarone 400 MG tablet Commonly known as: MULTAQ Take 1 tablet (400 mg total) by mouth 2 (two) times daily with a meal.   fluticasone 50 MCG/ACT nasal spray Commonly known as: FLONASE Place into the nose.   furosemide 20  MG tablet Commonly known as: LASIX Take 1 tablet (20 mg total) by mouth daily. Start taking on: October 18, 2022   HYDROcodone Bitartrate ER 15 MG Cp12 Take 15 mg by mouth 2 (two) times daily.   ondansetron 4 MG tablet Commonly known as: Zofran Take 1 tablet (4 mg total) by mouth every 8 (eight) hours as needed for nausea or vomiting.   oxyCODONE 5 MG/5ML solution Commonly known as: ROXICODONE Take 2.5 mg by mouth 2 (two) times daily as needed for breakthrough pain.   polyethylene glycol 17 g packet Commonly known as: MIRALAX / GLYCOLAX Take 17 g by mouth at bedtime. Hold for loose stool   pregabalin 25 MG capsule Commonly known as: LYRICA Take 25 mg by mouth 3 (three) times daily.  in the morning  in the afternoon and evening   PRESERVISION AREDS 2 PO Take 1 capsule by mouth 2 (two) times daily.   Savaysa 30 MG Tabs tablet Generic drug: edoxaban TAKE 1 TABLET(30 MG) BY MOUTH DAILY.   shark liver oil-cocoa butter 0.25-3-85.5 % suppository Commonly known as: PREPARATION H Place 1 suppository rectally as needed for hemorrhoids.           Outstanding Labs/Studies   BMET in 5-7 days  Duration of Discharge Encounter   Greater than 30 minutes including physician time.  Ramond Dial, PA 10/17/2022,  3:40 PM

## 2022-10-17 NOTE — Progress Notes (Signed)
Rounding Note    Patient Name: Tammy Walker Date of Encounter: 10/17/2022  [facility] Cardiologist: Donato Schultz, MD   Subjective   Denies any CP or SOB. Some back from scoliosis.   Inpatient Medications    Scheduled Meds:  acetaminophen  500 mg Oral TID   atorvastatin  20 mg Oral QPM   bisacodyl  10 mg Oral QHS   calcium-vitamin D  1 tablet Oral BID   dronedarone  400 mg Oral BID WC   edoxaban  30 mg Oral QPM   fluticasone  2 spray Each Nare QPM   furosemide  20 mg Oral Daily   lidocaine  1 patch Transdermal Q24H   loratadine  10 mg Oral Daily   oxyCODONE  10 mg Oral Q12H   polyethylene glycol  17 g Oral QHS   pregabalin  25 mg Oral 2 times per day   pregabalin  75 mg Oral Daily   sodium chloride flush  3 mL Intravenous Q12H   Continuous Infusions:  sodium chloride     PRN Meds: sodium chloride, acetaminophen, diclofenac Sodium, furosemide, ondansetron (ZOFRAN) IV, ondansetron, phenylephrine, sodium chloride flush   Vital Signs    Vitals:   10/16/22 1900 10/16/22 2005 10/17/22 0031 10/17/22 0416  BP: 105/83 90/64 91/60  94/60  Pulse: 100 (!) 102 93 100  Resp:  Temp:  98.1 F (36.7 C) 98.1 F (36.7 C) 98.1 F (36.7 C)  TempSrc:  Oral Oral Oral  SpO2:  92% 90%   Weight:    54.7 kg    Intake/Output Summary (Last 24 hours) at 10/17/2022 0743 Last data filed at 10/17/2022 0500 Gross per 24 hour  Intake --  Output 1500 ml  Net -1500 ml      10/17/2022    4:16 AM 06/02/2022    1:49 PM 12/26/2021    3:43 PM  Last 3 Weights  Weight (lbs) 120 lb 8 oz 118 lb 123 lb  Weight (kg) 54.658 kg 53.524 kg 55.792 kg      Telemetry    Atrial fibrillation with HR high 90s to low 100s - Personally Reviewed  ECG    Atrial fibrillation, LBBB - Personally Reviewed  Physical Exam   GEN: No acute distress.   Neck: No JVD Cardiac: irregularly irregular, no murmurs, rubs, or gallops.  Respiratory: Clear to auscultation bilaterally. GI: Soft,  nontender, non-distended  MS: No edema; No deformity. Neuro:  Nonfocal  Psych: Normal affect   Labs    High Sensitivity Troponin:   Recent Labs  Lab 10/15/22 1124 10/15/22 1415  TROPONINIHS 5 6      Chemistry Recent Labs  Lab 10/15/22 1124 10/16/22 0330  NA 133* 137  K 4.4 3.8  CL 99 104  CO2 20* 27  GLUCOSE 106* 90  BUN 9 10  CREATININE 0.72 0.83  CALCIUM 9.1 8.4*  PROT 6.5 5.4*  ALBUMIN 3.9 3.1*  AST 33 19  ALT 19 17  ALKPHOS 46 38  BILITOT 1.2 0.8  GFRNONAA >60 >60  ANIONGAP 14 6     Lipids No results for input(s): "CHOL", "TRIG", "HDL", "LABVLDL", "LDLCALC", "CHOLHDL" in the last 168 hours.  Hematology Recent Labs  Lab 10/15/22 1124 10/16/22 0330  WBC 8.2 4.8  RBC 4.57 4.02  HGB 13.0 11.2*  HCT 40.9 35.7*  MCV 89.5 88.8  MCH 28.4 27.9  MCHC 31.8 31.4  RDW 15.4 15.4  PLT 265 261    Thyroid  Recent Labs  Lab 10/16/22 0330  TSH 1.503     BNP Recent Labs  Lab 10/15/22 1124  BNP 501.2*     DDimer No results for input(s): "DDIMER" in the last 168 hours.   Radiology    DG Chest 1 View  Result Date: 10/15/2022 CLINICAL DATA:  Chest pain and shortness of breath. EXAM: CHEST  1 VIEW COMPARISON:  Chest radiograph 09/05/2021. FINDINGS: The heart is enlarged, unchanged. The upper mediastinal contours are within normal limits with calcified plaque in the aortic arch. Asymmetric opacity over the lateral left hemithorax is favored artifactual related to overlying soft tissues and patient rotation. Retrocardiac opacity is consistent with a hiatal hernia as seen on prior CT. There is no convincing acute airspace opacity. There is no pulmonary edema. There is no pleural effusion or pneumothorax There is no acute osseous abnormality. IMPRESSION: 1. No radiographic evidence of acute cardiopulmonary process. 2. Unchanged cardiomegaly and hiatal hernia. Electronically Signed   By: Lesia Hausen M.D.   On: 10/15/2022 10:49    Cardiac Studies   Echo  04/22/2021  1. Left ventricular ejection fraction, by estimation, is 55 to 60%. The  left ventricle has normal function. The left ventricle has no regional  wall motion abnormalities. Left ventricular diastolic parameters are  consistent with Grade II diastolic  dysfunction (pseudonormalization). The E/e' is 18.0.   2. Right ventricular systolic function is normal. The right ventricular  size is normal.   3. Left atrial size was moderately dilated.   4. The mitral valve is degenerative. Mild mitral valve regurgitation.   5. The aortic valve is tricuspid. There is moderate calcification of the  aortic valve. There is moderate thickening of the aortic valve. Aortic  valve regurgitation is mild. Mild to moderate aortic valve  sclerosis/calcification is present, without any  evidence of aortic stenosis.   Comparison(s): No significant change from prior study.    Patient Profile     89 y.o. female with PMH of PAF, HTN, chronic HFpEF, 1st degree AV block, LBBB, chronic back pain and aortic atherosclerosis who presented with chest pain and afib RVR  Assessment & Plan    Acute on chronic diastolic CHF  - BNP 501, had LE edema, received 2 doses of IV lasix, appears to be euvolemic on exam. Transitioned to  daily  - obtain echocardiogram, if normal, planning to DC today  Chest pain: improved with pain control, no further chest pain in the past 24 hours. Not related to exertion, given advanced age and atypical symptom, will hold on myoview or invasive study. Pending echocardiogram, if EF normal, will continue medical therapy.   PAF with RVR - on Multaq and Savaysa. H/o hypotension on metoprolol. Previously considering restarting digoxin for rate control, discussed with Dr. Duke Salvia, given her age and somewhat borderline controlled HR, will hold off on restarting digoxin. HR borderline in the high 90s to 100s range.   HLD   For questions or updates, please contact [facility] Please consult [email or web address] for contact info under        Signed, Azalee Course, PA  10/17/2022, 7:43 AM

## 2022-10-20 ENCOUNTER — Ambulatory Visit (HOSPITAL_COMMUNITY)
Admit: 2022-10-20 | Discharge: 2022-10-20 | Disposition: A | Discharge status: Home / Self Care | Payer: Medicare Other | Attending: Internal Medicine | Admitting: Internal Medicine

## 2022-10-20 VITALS — BP 114/82 | HR 109 | Ht 60.0 in | Wt 120.2 lb

## 2022-10-20 DIAGNOSIS — I480 Paroxysmal atrial fibrillation: Secondary | ICD-10-CM | POA: Diagnosis not present

## 2022-10-20 DIAGNOSIS — I447 Left bundle-branch block, unspecified: Secondary | ICD-10-CM | POA: Insufficient documentation

## 2022-10-20 DIAGNOSIS — N182 Chronic kidney disease, stage 2 (mild): Secondary | ICD-10-CM | POA: Insufficient documentation

## 2022-10-20 DIAGNOSIS — D6869 Other thrombophilia: Secondary | ICD-10-CM

## 2022-10-20 DIAGNOSIS — I10 Essential (primary) hypertension: Secondary | ICD-10-CM | POA: Diagnosis not present

## 2022-10-20 DIAGNOSIS — I5032 Chronic diastolic (congestive) heart failure: Secondary | ICD-10-CM | POA: Insufficient documentation

## 2022-10-20 DIAGNOSIS — I130 Hypertensive heart and chronic kidney disease with heart failure and stage 1 through stage 4 chronic kidney disease, or unspecified chronic kidney disease: Secondary | ICD-10-CM | POA: Insufficient documentation

## 2022-10-20 DIAGNOSIS — I4819 Other persistent atrial fibrillation: Secondary | ICD-10-CM

## 2022-10-20 LAB — BASIC METABOLIC PANEL
Anion gap: 9 (ref 5–15)
BUN: 9 mg/dL (ref 8–23)
CO2: 29 mmol/L (ref 22–32)
Calcium: 9 mg/dL (ref 8.9–10.3)
Chloride: 97 mmol/L — ABNORMAL LOW (ref 98–111)
Creatinine, Ser: 0.78 mg/dL (ref 0.44–1.00)
GFR, Estimated: >60 mL/min (ref >60)
Glucose, Bld: 98 mg/dL (ref 70–99)
Potassium: 4 mmol/L (ref 3.5–5.1)
Sodium: 135 mmol/L (ref 135–145)

## 2022-10-20 LAB — CBC
HCT: 39.4 % (ref 36.0–46.0)
Hemoglobin: 12.6 g/dL (ref 12.0–15.0)
MCH: 28.1 pg (ref 26.0–34.0)
MCHC: 32 g/dL (ref 30.0–36.0)
MCV: 87.8 fL (ref 80.0–100.0)
Platelets: 289 10*3/uL (ref 150–400)
RBC: 4.49 MIL/uL (ref 3.87–5.11)
RDW: 15.1 % (ref 11.5–15.5)
WBC: 5.5 10*3/uL (ref 4.0–10.5)
nRBC: 0 % (ref 0.0–0.2)

## 2022-10-20 NOTE — H&P (View-Only) (Signed)
  Primary Care Physician: Becker, Anna G, PA Primary Cardiologist: Dr. Skains Primary Electrophysiologist: Dr. Taylor Referring Physician:    Tammy Walker is a 89 y.o. female with a history of HTN, chronic HFpEF, 1st degree AV block, LBBB, aortic atherosclerosis, chronic back pain, CKD stage 2, and persistent atrial fibrillation who presents for consultation in the [facility].  The patient was initially diagnosed with atrial fibrillation in 2002. History of digoxin and metoprolol converted to NSR but later metoprolol discontinued due to dizziness and hypotension. History of amiodarone discontinued due to eye deposits seen by ophthalmology in 2021. She is currently on Multaq 400 mg BID which was started in 2023. She was recently admitted 4/17-19/24 due to chest pain and shortness of breath found to be in Afib with RVR. Discharged on lasix 20 mg daily and Multaq 400 mg BID. Patient is on edoxaban (Savaysa) 30 mg daily for a CHADS2VASC score of 5.  On evaluation today, she is currently in Afib. She states she feels a little tired but otherwise no shortness of breath or chest pain. She can do her day to day activities but it takes her a little longer.   She is compliant with anticoagulation and has not missed any doses. She has no bleeding concerns.  Today, she denies symptoms of palpitations, chest pain, shortness of breath, orthopnea, PND, lower extremity edema, dizziness, presyncope, syncope, snoring, daytime somnolence, bleeding, or neurologic sequela. The patient is tolerating medications without difficulties and is otherwise without complaint today.   Atrial Fibrillation Risk Factors:  she does not have symptoms or diagnosis of sleep apnea. she does not have a history of rheumatic fever. she does not have a history of alcohol use. The patient does not have a history of early familial atrial fibrillation or other arrhythmias.  she has a BMI of Body mass index  is 23.47 kg/m.. Filed Weights   10/20/22 1033  Weight: 54.5 kg    Family History  Problem Relation Age of Onset   Heart disease Mother    High blood pressure Mother    COPD Mother    Heart failure Mother    Heart disease Father    Heart disease Brother    Heart failure Brother    Hyperlipidemia Daughter    Hypertension Daughter      Atrial Fibrillation Management history:  Previous antiarrhythmic drugs: Amiodarone stopped due to eye deposits. Previous cardioversions: 2002 Previous ablations: None Anticoagulation history: Savaysa   Past Medical History:  Diagnosis Date   Allergy    Arrhythmia    Atrial fibrillation    GERD (gastroesophageal reflux disease)    History of bone scan    Bone Density Scans 2017 & 2019 Dr. Poole    History of chest x-ray    Apart from large hiatus hernia, normal heart, lungs and mediastinum. Per records from [facility]     History of CT scan of abdomen 10/24/2017   Gallstones within a thin-waled gallbladder, Per records from [facility]    History of echocardiogram    [facility] &  following   [facility]    History of EKG    Sinus rhythm, borderline 1st deg block, LAD completely changed axis, LBBB(old). Per records from [facility]    Hyperlipidemia    Hypertension    Insomnia    Left lower lobe pneumonia 09/20/2015   Per records from [facility]    Nodule of right lung      Per records from [facility],    Osteoporosis    on fosamax   Osteoporosis    Reduced mobility    Past Surgical History:  Procedure Laterality Date   APPENDECTOMY  1947   [place], [place]    CT SCAN     2017, 2018, 2019 -- re lungs see records    TUBAL LIGATION  1978   [facility], [place], [place]     Current Outpatient Medications  Medication Sig Dispense Refill   acetaminophen (TYLENOL) 500 MG tablet Take 500 mg by mouth 3 (three) times daily.     atorvastatin (LIPITOR)  20 MG tablet TAKE 1 TABLET(20 MG) BY MOUTH AT BEDTIME 90 tablet 3   bisacodyl (DULCOLAX) 5 MG EC tablet Take 10 mg by mouth at bedtime.     Calcium-Phosphorus-Vitamin D (CALCIUM/D3 ADULT GUMMIES PO) Take 1 tablet by mouth 4 (four) times daily. Calcium 200 mg/ Vitamin D3 500 I.U. - Vitafusion     calcium-vitamin D (OSCAL WITH D) 500-200 MG-UNIT tablet Take 1 tablet by mouth 2 (two) times daily. 60 tablet 3   cetirizine (ZYRTEC) 10 MG tablet Take 10 mg by mouth every morning.     clotrimazole (LOTRIMIN) 1 % cream Apply 1 application. topically 2 (two) times daily as needed (fungus).     denosumab (PROLIA) 60 MG/ML SOSY injection Inject 60 mg into the skin every 6 (six) months.     diclofenac Sodium (VOLTAREN) 1 % GEL Apply 4 g topically 4 (four) times daily as needed (back pain).     dronedarone (MULTAQ) 400 MG tablet Take 1 tablet (400 mg total) by mouth 2 (two) times daily with a meal. 180 tablet 3   edoxaban (SAVAYSA) 30 MG TABS tablet TAKE 1 TABLET(30 MG) BY MOUTH DAILY. 90 tablet 0   fluticasone (FLONASE) 50 MCG/ACT nasal spray Place 2 sprays into both nostrils daily.     furosemide (LASIX) 20 MG tablet Take 1 tablet (20 mg total) by mouth daily. 30 tablet 3   HYDROcodone Bitartrate ER 15 MG CP12 Take 15 mg by mouth 2 (two) times daily.     Multiple Vitamins-Minerals (PRESERVISION AREDS 2 PO) Take 1 capsule by mouth 2 (two) times daily.     ondansetron (ZOFRAN) 4 MG tablet Take 1 tablet (4 mg total) by mouth every 8 (eight) hours as needed for nausea or vomiting. 20 tablet 0   oxyCODONE (ROXICODONE) 5 MG/5ML solution Take 2.5 mg by mouth 2 (two) times daily as needed for breakthrough pain.     polyethylene glycol (MIRALAX / GLYCOLAX) 17 g packet Take 17 g by mouth 2 (two) times daily. Hold for loose stool- uses 1/2 doses     Polyvinyl Alcohol-Povidone (REFRESH OP) Apply to eye as needed.     shark liver oil-cocoa butter (PREPARATION H) 0.25-3-85.5 % suppository Place 1 suppository rectally as  needed for hemorrhoids.     pregabalin (LYRICA) 25 MG capsule Take 25 mg by mouth 3 (three) times daily. 75mg in the morning 25mg in the afternoon and evening     No current facility-administered medications for this encounter.    No Known Allergies  Social History   Socioeconomic History   Marital status: Widowed    Spouse name: Not on file   Number of children: Not on file   Years of education: Not on file   Highest education level: Not on file  Occupational History   Not on file  Tobacco Use   Smoking status: Never     Smokeless tobacco: Never  Vaping Use   Vaping Use: Never used  Substance and Sexual Activity   Alcohol use: Not Currently    Comment: less than one drink a week   Drug use: No   Sexual activity: Never    Birth control/protection: None  Other Topics Concern   Not on file  Social History Narrative   Social History      Diet? I watch my intake of salt, sugar, and fat      Do you drink/eat things with caffeine? Yes       Marital status?      Widowed                               What year were you married? 1960      Do you live in a house, apartment, assisted living, condo, trailer, etc.? House       Is it one or more stories? one      How many persons live in your home? 2      Do you have any pets in your home? (please list) yes 2 cats       Highest level of education completed? 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             yes                         Type & how often? Strength & Balance       Advanced Directives      Do you have a living will?  No       Do you have a DNR form?        No                           If not, do you want to discuss one? No       Do you have signed POA/HPOA for forms? Yes       Functional Status      Do you have difficulty bathing or dressing yourself? No       Do you have difficulty preparing food or eating? No        Do you have difficulty managing your medications? No       Do you have difficulty managing your finances? No       Do you have difficulty affording your medications? No       Social Determinants of Health   Financial Resource Strain: Not on file  Food Insecurity: Not on file  Transportation Needs: Not on file  Physical Activity: Not on file  Stress: Not on file  Social Connections: Not on file  Intimate Partner Violence: Not on file     ROS- All systems are reviewed and negative except as per the HPI above.  Physical Exam: Vitals:   10/20/22 1033  Weight: 54.5 kg  Height: 5' (1.524 m)    GEN- The patient is well appearing, alert and oriented x 3 today.   Head- normocephalic, atraumatic Eyes-  Sclera clear, conjunctiva pink Ears- hearing intact Oropharynx- clear Neck- supple, no JVP Lymph- no cervical lymphadenopathy Lungs- Clear to ausculation bilaterally, normal work of breathing Heart- Irregular   rate and rhythm, no murmurs, rubs or gallops, PMI not laterally displaced GI- soft, NT, ND, + BS Extremities- no clubbing, cyanosis, or edema MS- no significant deformity or atrophy Skin- no rash or lesion Psych- euthymic mood, full affect Neuro- strength and sensation are intact   Wt Readings from Last 3 Encounters:  10/20/22 54.5 kg  10/17/22 54.7 kg  06/02/22 53.5 kg    EKG today demonstrates Vent. rate 109 BPM PR interval * ms QRS duration 136 ms QT/QTcB 380/511 ms P-R-T axes * 119 -54 Atrial fibrillation with rapid ventricular response Right axis deviation Left ventricular hypertrophy with QRS widening and repolarization abnormality ( Romhilt-Estes ) Abnormal ECG When compared with ECG of 16-Oct-2022 12:06, PREVIOUS ECG IS PRESENT  Echo 10/17/22 demonstrated:  1. Left ventricular ejection fraction, by estimation, is 45 to 50%. The  left ventricle has mildly decreased function. The left ventricle  demonstrates regional wall motion abnormalities  with septal hypokinesis  and septal-lateral dyssynchrony consistent  with LBBB. There is moderate asymmetric left ventricular hypertrophy of  the basal-septal segment. Left ventricular diastolic parameters are  indeterminate.   2. Right ventricular systolic function is normal. The right ventricular  size is normal. There is moderately elevated pulmonary artery systolic  pressure. The estimated right ventricular systolic pressure is 45.7 mmHg.   3. Left atrial size was severely dilated.   4. Right atrial size was severely dilated.   5. The mitral valve is abnormal. 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 No evidence of  mitral stenosis.   6. Tricuspid valve regurgitation is moderate.   7. The aortic valve is tricuspid. There is moderate calcification of the  aortic valve. Aortic valve regurgitation is mild. Aortic valve  sclerosis/calcification is present, without any evidence of aortic  stenosis. Aortic valve mean gradient measures 6.2  mmHg.   8. Aortic dilatation noted. There is mild dilatation of the descending  aorta, measuring 39 mm.   9. The inferior vena cava is normal in size with <50% respiratory  variability, suggesting right atrial pressure of 8 mmHg.  10. The patient appeared to be in atrial fibrillation.   Epic records are reviewed at length today.  CHA2DS2-VASc Score = 5  The patient's score is based upon: CHF History: 1 HTN History: 1 Diabetes History: 0 Stroke History: 0 Vascular Disease History: 0 Age Score: 2 Gender Score: 1       ASSESSMENT AND PLAN: Persistent Atrial Fibrillation (ICD10:  I48.19) The patient's CHA2DS2-VASc score is 5, indicating a 7.2% annual risk of stroke.    Education provided about Afib with visual diagram. Discussion about medication treatments in detail which would include continuing Multaq and attempting a cardioversion (she has not required cardioversion in the past year while  taking Multaq) or considering Tikosyn.   Estimated CrCl of 40 with Creatine 0.83 on 4/18 would place her on border of Tikosyn 125/250 mcg dose so this can be an option. After discussion with patient and daughter, patient would like to proceed with scheduling DCCV.   Will plan to see patient 1-2 weeks after DCCV. If it is unsuccessful or she goes back into Afib, then our option for management would be Tikosyn.    2. Secondary Hypercoagulable State (ICD10:  D68.69) The patient is at significant risk for stroke/thromboembolism based upon her CHA2DS2-VASc Score of 5.  Continue Apixaban (Eliquis).  No missed doses.   3. HTN Stable today, no changes to current regimen.     Follow up 1-2 weeks after DCCV.    Jalaysha Skilton, PA-C [facility] [address] [place], [place] [address] [phone] 10/20/2022 10:54 AM  

## 2022-10-20 NOTE — Progress Notes (Signed)
Primary Care Physician: Wilfrid Lund, PA Primary Cardiologist: Dr. Anne Fu Primary Electrophysiologist: Dr. Ladona Ridgel Referring Physician:    [name] is a 89 y.o. female with a history of HTN, chronic HFpEF, 1st degree AV block, LBBB, aortic atherosclerosis, chronic back pain, CKD stage 2, and persistent atrial fibrillation who presents for consultation in the [facility].  The patient was initially diagnosed with atrial fibrillation in 2002. History of digoxin and metoprolol converted to NSR but later metoprolol discontinued due to dizziness and hypotension. History of amiodarone discontinued due to eye deposits seen by ophthalmology in 2021. She is currently on Multaq 400 mg BID which was started in 2023. She was recently admitted 4/17-19/24 due to chest pain and shortness of breath found to be in Afib with RVR. Discharged on lasix 20 mg daily and Multaq 400 mg BID. Patient is on edoxaban (Savaysa) 30 mg daily for a CHADS2VASC score of 5.  On evaluation today, she is currently in Afib. She states she feels a little tired but otherwise no shortness of breath or chest pain. She can do her day to day activities but it takes her a little longer.   She is compliant with anticoagulation and has not missed any doses. She has no bleeding concerns.  Today, she denies symptoms of palpitations, chest pain, shortness of breath, orthopnea, PND, lower extremity edema, dizziness, presyncope, syncope, snoring, daytime somnolence, bleeding, or neurologic sequela. The patient is tolerating medications without difficulties and is otherwise without complaint today.   Atrial Fibrillation Risk Factors:  she does not have symptoms or diagnosis of sleep apnea. she does not have a history of rheumatic fever. she does not have a history of alcohol use. The patient does not have a history of early familial atrial fibrillation or other arrhythmias.  she has a BMI of Body mass index  is 23.47 kg/m.[name] Filed Weights   10/20/22 1033  Weight: 54.5 kg    Family History  Problem Relation Age of Onset   Heart disease Mother    High blood pressure Mother    COPD Mother    Heart failure Mother    Heart disease Father    Heart disease Brother    Heart failure Brother    Hyperlipidemia Daughter    Hypertension Daughter      Atrial Fibrillation Management history:  Previous antiarrhythmic drugs: Amiodarone stopped due to eye deposits. Previous cardioversions: 2002 Previous ablations: None Anticoagulation history: Savaysa   Past Medical History:  Diagnosis Date   Allergy    Arrhythmia    Atrial fibrillation    GERD (gastroesophageal reflux disease)    History of bone scan    Bone Density Scans 2017 & 2019 Dr. Dutch Quint    History of chest x-ray    Apart from large hiatus hernia, normal heart, lungs and mediastinum. Per records from [facility]     History of CT scan of abdomen 10/24/2017   Gallstones within a thin-waled gallbladder, Per records from [facility]    History of echocardiogram    [facility] &  [facility]    History of EKG    Sinus rhythm, borderline 1st deg block, LAD completely changed axis, LBBB(old). Per records from [facility]    Hyperlipidemia    Hypertension    Insomnia    Left lower lobe pneumonia 09/20/2015   Per records from [facility]    Nodule of right lung  Per records from [facility],    Osteoporosis    on fosamax   Osteoporosis    Reduced mobility    Past Surgical History:  Procedure Laterality Date   APPENDECTOMY  [address] [place]    CT SCAN     2017, 2018, 2019 -- re lungs see records    TUBAL LIGATION  1978   [place], [place], [place]     Current Outpatient Medications  Medication Sig Dispense Refill   acetaminophen (TYLENOL) 500 MG tablet Take 500 mg by mouth 3 (three) times daily.     atorvastatin (LIPITOR)  20 MG tablet TAKE 1 TABLET(20 MG) BY MOUTH AT BEDTIME 90 tablet 3   bisacodyl (DULCOLAX) 5 MG EC tablet Take 10 mg by mouth at bedtime.     Calcium-Phosphorus-Vitamin D (CALCIUM/D3 ADULT GUMMIES PO) Take 1 tablet by mouth 4 (four) times daily. Calcium 200 mg/ Vitamin D3 500 I.U. - Vitafusion     calcium-vitamin D (OSCAL WITH D) 500-200 MG-UNIT tablet Take 1 tablet by mouth 2 (two) times daily. 60 tablet 3   cetirizine (ZYRTEC) 10 MG tablet Take 10 mg by mouth every morning.     clotrimazole (LOTRIMIN) 1 % cream Apply 1 application. topically 2 (two) times daily as needed (fungus).     denosumab (PROLIA) 60 MG/ML SOSY injection Inject 60 mg into the skin every 6 (six) months.     diclofenac Sodium (VOLTAREN) 1 % GEL Apply 4 g topically 4 (four) times daily as needed (back pain).     dronedarone (MULTAQ) 400 MG tablet Take 1 tablet (400 mg total) by mouth 2 (two) times daily with a meal. 180 tablet 3   edoxaban (SAVAYSA) 30 MG TABS tablet TAKE 1 TABLET(30 MG) BY MOUTH DAILY. 90 tablet 0   fluticasone (FLONASE) 50 MCG/ACT nasal spray Place 2 sprays into both nostrils daily.     furosemide (LASIX) 20 MG tablet Take 1 tablet (20 mg total) by mouth daily. 30 tablet 3   HYDROcodone Bitartrate ER 15 MG CP12 Take 15 mg by mouth 2 (two) times daily.     Multiple Vitamins-Minerals (PRESERVISION AREDS 2 PO) Take 1 capsule by mouth 2 (two) times daily.     ondansetron (ZOFRAN) 4 MG tablet Take 1 tablet (4 mg total) by mouth every 8 (eight) hours as needed for nausea or vomiting. 20 tablet 0   oxyCODONE (ROXICODONE) 5 MG/5ML solution Take 2.5 mg by mouth 2 (two) times daily as needed for breakthrough pain.     polyethylene glycol (MIRALAX / GLYCOLAX) 17 g packet Take 17 g by mouth 2 (two) times daily. Hold for loose stool- uses 1/2 doses     Polyvinyl Alcohol-Povidone (REFRESH OP) Apply to eye as needed.     shark liver oil-cocoa butter (PREPARATION H) 0.25-3-85.5 % suppository Place 1 suppository rectally as  needed for hemorrhoids.     pregabalin (LYRICA) 25 MG capsule Take 25 mg by mouth 3 (three) times daily.  in the morning  in the afternoon and evening     No current facility-administered medications for this encounter.    No Known Allergies  Social History   Socioeconomic History   Marital status: Widowed    Spouse name: Not on file   Number of children: Not on file   Years of education: Not on file   Highest education level: Not on file  Occupational History   Not on file  Tobacco Use   Smoking status: Never  Smokeless tobacco: Never  Vaping Use   Vaping Use: Never used  Substance and Sexual Activity   Alcohol use: Not Currently    Comment: less than one drink a week   Drug use: No   Sexual activity: Never    Birth control/protection: None  Other Topics Concern   Not on file  Social History Narrative   Social History      Diet? I watch my intake of salt, sugar, and fat      Do you drink/eat things with caffeine? Yes       Marital status?      Widowed                               What year were you married? 1960      Do you live in a house, apartment, assisted living, condo, trailer, etc.? House       Is it one or more stories? one      How many persons live in your home? 2      Do you have any pets in your home? (please list) yes 2 cats       Highest level of education completed? 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             yes                         Type & how often? Strength & Balance       Advanced Directives      Do you have a living will?  No       Do you have a DNR form?        No                           If not, do you want to discuss one? No       Do you have signed POA/HPOA for forms? Yes       Functional Status      Do you have difficulty bathing or dressing yourself? No       Do you have difficulty preparing food or eating? No        Do you have difficulty managing your medications? No       Do you have difficulty managing your finances? No       Do you have difficulty affording your medications? No       Social Determinants of Corporate investment banker Strain: Not on file  Food Insecurity: Not on file  Transportation Needs: Not on file  Physical Activity: Not on file  Stress: Not on file  Social Connections: Not on file  Intimate Partner Violence: Not on file     ROS- All systems are reviewed and negative except as per the HPI above.  Physical Exam: Vitals:   10/20/22 1033  Weight: 54.5 kg  Height: 5' (1.524 m)    GEN- The patient is well appearing, alert and oriented x 3 today.   Head- normocephalic, atraumatic Eyes-  Sclera clear, conjunctiva pink Ears- hearing intact Oropharynx- clear Neck- supple, no JVP Lymph- no cervical lymphadenopathy Lungs- Clear to ausculation bilaterally, normal work of breathing Heart- Irregular  rate and rhythm, no murmurs, rubs or gallops, PMI not laterally displaced GI- soft, NT, ND, + BS Extremities- no clubbing, cyanosis, or edema MS- no significant deformity or atrophy Skin- no rash or lesion Psych- euthymic mood, full affect Neuro- strength and sensation are intact   Wt Readings from Last 3 Encounters:  10/20/22 54.5 kg  10/17/22 54.7 kg  06/02/22 53.5 kg    EKG today demonstrates Vent. rate 109 BPM PR interval * ms QRS duration 136 ms QT/QTcB 380/511 ms P-R-T axes * 119 -54 Atrial fibrillation with rapid ventricular response Right axis deviation Left ventricular hypertrophy with QRS widening and repolarization abnormality ( Romhilt-Estes ) Abnormal ECG When compared with ECG of 16-Oct-2022 12:06, PREVIOUS ECG IS PRESENT  Echo 10/17/22 demonstrated:  1. Left ventricular ejection fraction, by estimation, is 45 to 50%. The  left ventricle has mildly decreased function. The left ventricle  demonstrates regional wall motion abnormalities  with septal hypokinesis  and septal-lateral dyssynchrony consistent  with LBBB. There is moderate asymmetric left ventricular hypertrophy of  the basal-septal segment. Left ventricular diastolic parameters are  indeterminate.   2. Right ventricular systolic function is normal. The right ventricular  size is normal. There is moderately elevated pulmonary artery systolic  pressure. The estimated right ventricular systolic pressure is 45.7 mmHg.   3. Left atrial size was severely dilated.   4. Right atrial size was severely dilated.   5. The mitral valve is abnormal. 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 No evidence of  mitral stenosis.   6. Tricuspid valve regurgitation is moderate.   7. The aortic valve is tricuspid. There is moderate calcification of the  aortic valve. Aortic valve regurgitation is mild. Aortic valve  sclerosis/calcification is present, without any evidence of aortic  stenosis. Aortic valve mean gradient measures 6.2  mmHg.   8. Aortic dilatation noted. There is mild dilatation of the descending  aorta, measuring 39 mm.   9. The inferior vena cava is normal in size with <50% respiratory  variability, suggesting right atrial pressure of 8 mmHg.  10. The patient appeared to be in atrial fibrillation.   Epic records are reviewed at length today.  CHA2DS2-VASc Score = 5  The patient's score is based upon: CHF History: 1 HTN History: 1 Diabetes History: 0 Stroke History: 0 Vascular Disease History: 0 Age Score: 2 Gender Score: 1       ASSESSMENT AND PLAN: Persistent Atrial Fibrillation (ICD10:  I48.19) The patient's CHA2DS2-VASc score is 5, indicating a 7.2% annual risk of stroke.    Education provided about Afib with visual diagram. Discussion about medication treatments in detail which would include continuing Multaq and attempting a cardioversion (she has not required cardioversion in the past year while  taking Multaq) or considering Tikosyn.   Estimated CrCl of 40 with Creatine 0.83 on 4/18 would place her on border of Tikosyn 125/250 mcg dose so this can be an option. After discussion with patient and daughter, patient would like to proceed with scheduling DCCV.   Will plan to see patient 1-2 weeks after DCCV. If it is unsuccessful or she goes back into Afib, then our option for management would be Tikosyn.    2. Secondary Hypercoagulable State (ICD10:  D68.69) The patient is at significant risk for stroke/thromboembolism based upon her CHA2DS2-VASc Score of 5.  Continue Apixaban (Eliquis).  No missed doses.   3. HTN Stable today, no changes to current regimen.  Follow up 1-2 weeks after DCCV.    Lake Bells, PA-C Afib Clinic [facility] [address] [place], [place] [address] [phone] 10/20/2022 10:54 AM

## 2022-10-20 NOTE — Patient Instructions (Signed)
Cardioversion scheduled ZOX:[id number], May 1st   - Arrive at the [facility] and go to admitting at 10am   - Do not eat or drink anything after midnight the night prior to your procedure.   - Take all your morning medication (except diabetic medications) with a sip of water prior to arrival.  - You will not be able to drive home after your procedure.    - Do NOT miss any doses of your blood thinner - if you should miss a dose please notify our office immediately.   - If you feel as if you go back into normal rhythm prior to scheduled cardioversion, please notify our office immediately.   If your procedure is canceled in the cardioversion suite you will be charged a cancellation fee.

## 2022-10-22 ENCOUNTER — Other Ambulatory Visit: Payer: Medicare Other

## 2022-10-27 ENCOUNTER — Ambulatory Visit: Payer: Medicare Other | Attending: Physician Assistant

## 2022-10-27 DIAGNOSIS — I5032 Chronic diastolic (congestive) heart failure: Secondary | ICD-10-CM

## 2022-10-28 LAB — BASIC METABOLIC PANEL
BUN/Creatinine Ratio: 15 (ref 12–28)
BUN: 11 mg/dL (ref 8–27)
CO2: 24 mmol/L (ref 20–29)
Calcium: 9.2 mg/dL (ref 8.7–10.3)
Chloride: 100 mmol/L (ref 96–106)
Creatinine, Ser: 0.72 mg/dL (ref 0.57–1.00)
Glucose: 115 mg/dL — ABNORMAL HIGH (ref 70–99)
Potassium: 4.2 mmol/L (ref 3.5–5.2)
Sodium: 139 mmol/L (ref 134–144)
eGFR: 80 mL/min/{1.73_m2} (ref >59)

## 2022-10-28 NOTE — Progress Notes (Signed)
Message left, no answer, Procedure scheduled for   1100, Please arrive at the hospital at 1000 , NPO after midnight on Tuesday, May take meds with sips of water in the AM, please have transportation for home post procedure, and someone to stay with pt for approximately 24 hours after

## 2022-10-29 ENCOUNTER — Other Ambulatory Visit: Payer: Self-pay

## 2022-10-29 ENCOUNTER — Ambulatory Visit (HOSPITAL_COMMUNITY)
Admission: RE | Admit: 2022-10-29 | Discharge: 2022-10-29 | Disposition: A | Discharge status: Home / Self Care | Payer: Medicare Other | Attending: Cardiovascular Disease | Admitting: Cardiovascular Disease

## 2022-10-29 ENCOUNTER — Ambulatory Visit (HOSPITAL_BASED_OUTPATIENT_CLINIC_OR_DEPARTMENT_OTHER): Payer: Medicare Other | Admitting: Anesthesiology

## 2022-10-29 ENCOUNTER — Encounter (HOSPITAL_COMMUNITY)
Admission: RE | Disposition: A | Discharge status: Home / Self Care | Payer: Self-pay | Source: Home / Self Care | Attending: Cardiovascular Disease

## 2022-10-29 ENCOUNTER — Ambulatory Visit (HOSPITAL_COMMUNITY): Payer: Medicare Other | Admitting: Anesthesiology

## 2022-10-29 DIAGNOSIS — I130 Hypertensive heart and chronic kidney disease with heart failure and stage 1 through stage 4 chronic kidney disease, or unspecified chronic kidney disease: Secondary | ICD-10-CM | POA: Insufficient documentation

## 2022-10-29 DIAGNOSIS — I447 Left bundle-branch block, unspecified: Secondary | ICD-10-CM | POA: Diagnosis not present

## 2022-10-29 DIAGNOSIS — I700 Atherosclerosis of aorta: Secondary | ICD-10-CM | POA: Insufficient documentation

## 2022-10-29 DIAGNOSIS — M549 Dorsalgia, unspecified: Secondary | ICD-10-CM | POA: Insufficient documentation

## 2022-10-29 DIAGNOSIS — G8929 Other chronic pain: Secondary | ICD-10-CM | POA: Diagnosis not present

## 2022-10-29 DIAGNOSIS — I440 Atrioventricular block, first degree: Secondary | ICD-10-CM | POA: Insufficient documentation

## 2022-10-29 DIAGNOSIS — I509 Heart failure, unspecified: Secondary | ICD-10-CM | POA: Diagnosis not present

## 2022-10-29 DIAGNOSIS — D6869 Other thrombophilia: Secondary | ICD-10-CM | POA: Diagnosis not present

## 2022-10-29 DIAGNOSIS — I4891 Unspecified atrial fibrillation: Secondary | ICD-10-CM

## 2022-10-29 DIAGNOSIS — I5032 Chronic diastolic (congestive) heart failure: Secondary | ICD-10-CM | POA: Insufficient documentation

## 2022-10-29 DIAGNOSIS — N182 Chronic kidney disease, stage 2 (mild): Secondary | ICD-10-CM | POA: Insufficient documentation

## 2022-10-29 DIAGNOSIS — I4819 Other persistent atrial fibrillation: Secondary | ICD-10-CM | POA: Diagnosis present

## 2022-10-29 DIAGNOSIS — I110 Hypertensive heart disease with heart failure: Secondary | ICD-10-CM | POA: Diagnosis not present

## 2022-10-29 DIAGNOSIS — Z79899 Other long term (current) drug therapy: Secondary | ICD-10-CM | POA: Diagnosis not present

## 2022-10-29 HISTORY — PX: CARDIOVERSION: SHX1299

## 2022-10-29 SURGERY — CARDIOVERSION
Anesthesia: General

## 2022-10-29 MED ORDER — SODIUM CHLORIDE 0.9 % IV SOLN: INTRAVENOUS | Status: DC | PRN

## 2022-10-29 MED ORDER — PROPOFOL 10 MG/ML IV BOLUS
INTRAVENOUS | Status: DC | PRN
  Administered 2022-10-29: 50 mg via INTRAVENOUS

## 2022-10-29 MED ORDER — SODIUM CHLORIDE 0.9 % IV SOLN
INTRAVENOUS | Status: DC
Start: 2022-10-29 — End: 2022-10-29

## 2022-10-29 MED ORDER — LIDOCAINE 2% (20 MG/ML) 5 ML SYRINGE
INTRAMUSCULAR | Status: DC | PRN
  Administered 2022-10-29: 60 mg via INTRAVENOUS

## 2022-10-29 SURGICAL SUPPLY — 1 items: ELECT DEFIB PAD ADLT CADENCE (PAD) ×1 IMPLANT

## 2022-10-29 NOTE — Anesthesia Preprocedure Evaluation (Signed)
Anesthesia Evaluation  Patient identified by MRN, date of birth, ID band Patient awake    Reviewed: Allergy & Precautions, NPO status , Patient's Chart, lab work & pertinent test results  Airway Mallampati: II  TM Distance: >3 FB Neck ROM: Full    Dental  (+) Teeth Intact, Dental Advisory Given   Pulmonary neg pulmonary ROS   Pulmonary exam normal breath sounds clear to auscultation       Cardiovascular hypertension, +CHF  + dysrhythmias Atrial Fibrillation  Rhythm:Irregular Rate:Abnormal     Neuro/Psych negative neurological ROS     GI/Hepatic Neg liver ROS, hiatal hernia,GERD  ,,  Endo/Other  negative endocrine ROS    Renal/GU negative Renal ROS     Musculoskeletal negative musculoskeletal ROS (+)    Abdominal   Peds  Hematology  (+) Blood dyscrasia (Edoxaban)   Anesthesia Other Findings Day of surgery medications reviewed with the patient.  Reproductive/Obstetrics                             Anesthesia Physical Anesthesia Plan  ASA: 3  Anesthesia Plan: General   Post-op Pain Management:    Induction: Intravenous  PONV Risk Score and Plan: 3 and TIVA and Treatment may vary due to age or medical condition  Airway Management Planned: Mask  Additional Equipment:   Intra-op Plan:   Post-operative Plan:   Informed Consent: I have reviewed the patients History and Physical, chart, labs and discussed the procedure including the risks, benefits and alternatives for the proposed anesthesia with the patient or authorized representative who has indicated his/her understanding and acceptance.     Dental advisory given  Plan Discussed with: CRNA  Anesthesia Plan Comments:        Anesthesia Quick Evaluation

## 2022-10-29 NOTE — Interval H&P Note (Signed)
History and Physical Interval Note:  10/29/2022 10:15 AM  [name]  has presented today for surgery, with the diagnosis of AFIB.  The various methods of treatment have been discussed with the patient and family. After consideration of risks, benefits and other options for treatment, the patient has consented to  Procedure(s): CARDIOVERSION (N/A) as a surgical intervention.  The patient's history has been reviewed, patient examined, no change in status, stable for surgery.  I have reviewed the patient's chart and labs.  Questions were answered to the patient's satisfaction.     Tammy Walker

## 2022-10-29 NOTE — Interval H&P Note (Signed)
History and Physical Interval Note:  10/29/2022 10:59 AM  Tammy Walker  has presented today for surgery, with the diagnosis of AFIB.  The various methods of treatment have been discussed with the patient and family. After consideration of risks, benefits and other options for treatment, the patient has consented to  Procedure(s): CARDIOVERSION (N/A) as a surgical intervention.  The patient's history has been reviewed, patient examined, no change in status, stable for surgery.  I have reviewed the patient's chart and labs.  Questions were answered to the patient's satisfaction.     Shelly Spenser

## 2022-10-29 NOTE — Anesthesia Procedure Notes (Signed)
Procedure Name: General with mask airway Date/Time: 10/29/2022 10:49 AM  Performed by: Marena Chancy, CRNAPre-anesthesia Checklist: Patient identified, Emergency Drugs available, Timeout performed, Suction available and Patient being monitored Patient Re-evaluated:Patient Re-evaluated prior to induction Oxygen Delivery Method: Ambu bag Preoxygenation: Pre-oxygenation with 100% oxygen Induction Type: IV induction

## 2022-10-29 NOTE — Transfer of Care (Signed)
Immediate Anesthesia Transfer of Care Note  Patient: Tammy Walker  Procedure(s) Performed: CARDIOVERSION  Patient Location: Cath Lab  Anesthesia Type:General  Level of Consciousness: awake, alert , and oriented  Airway & Oxygen Therapy: Patient Spontanous Breathing  Post-op Assessment: Report given to RN and Post -op Vital signs reviewed and stable  Post vital signs: Reviewed and stable  Last Vitals:  Vitals Value Taken Time  BP 140/85 10/29/22 1045  Temp    Pulse 108 10/29/22 1047  Resp 25 10/29/22 1047  SpO2 98 % 10/29/22 1047  Vitals shown include unvalidated device data.  Last Pain:  Vitals:   10/29/22 1025  TempSrc: Temporal         Complications: No notable events documented.

## 2022-10-29 NOTE — Anesthesia Postprocedure Evaluation (Signed)
Anesthesia Post Note  Patient: Tammy Walker  Procedure(s) Performed: CARDIOVERSION     Patient location during evaluation: Cath Lab Anesthesia Type: General Level of consciousness: awake and alert Pain management: pain level controlled Vital Signs Assessment: post-procedure vital signs reviewed and stable Respiratory status: spontaneous breathing, nonlabored ventilation, respiratory function stable and patient connected to nasal cannula oxygen Cardiovascular status: blood pressure returned to baseline and stable Postop Assessment: no apparent nausea or vomiting Anesthetic complications: no   No notable events documented.  Last Vitals:  Vitals:   10/29/22 1119 10/29/22 1120  BP:  (!) 102/57  Pulse: 76 74  Resp: 18 18  Temp:    SpO2: 93% 95%    Last Pain:  Vitals:   10/29/22 1025  TempSrc: Temporal                 Collene Schlichter

## 2022-10-30 ENCOUNTER — Encounter (HOSPITAL_COMMUNITY): Payer: Self-pay | Admitting: Cardiovascular Disease

## 2022-11-05 ENCOUNTER — Ambulatory Visit (HOSPITAL_COMMUNITY)
Admission: RE | Admit: 2022-11-05 | Discharge: 2022-11-05 | Disposition: A | Discharge status: Home / Self Care | Payer: Medicare Other | Source: Ambulatory Visit | Attending: Internal Medicine | Admitting: Internal Medicine

## 2022-11-05 ENCOUNTER — Other Ambulatory Visit (HOSPITAL_COMMUNITY): Payer: Self-pay | Admitting: Internal Medicine

## 2022-11-05 VITALS — BP 106/64 | HR 91 | Ht 60.0 in | Wt 119.0 lb

## 2022-11-05 DIAGNOSIS — I130 Hypertensive heart and chronic kidney disease with heart failure and stage 1 through stage 4 chronic kidney disease, or unspecified chronic kidney disease: Secondary | ICD-10-CM | POA: Insufficient documentation

## 2022-11-05 DIAGNOSIS — D6869 Other thrombophilia: Secondary | ICD-10-CM

## 2022-11-05 DIAGNOSIS — I447 Left bundle-branch block, unspecified: Secondary | ICD-10-CM | POA: Insufficient documentation

## 2022-11-05 DIAGNOSIS — I4819 Other persistent atrial fibrillation: Secondary | ICD-10-CM | POA: Diagnosis present

## 2022-11-05 DIAGNOSIS — Z7901 Long term (current) use of anticoagulants: Secondary | ICD-10-CM | POA: Insufficient documentation

## 2022-11-05 DIAGNOSIS — I480 Paroxysmal atrial fibrillation: Secondary | ICD-10-CM

## 2022-11-05 DIAGNOSIS — N182 Chronic kidney disease, stage 2 (mild): Secondary | ICD-10-CM | POA: Insufficient documentation

## 2022-11-05 DIAGNOSIS — I5030 Unspecified diastolic (congestive) heart failure: Secondary | ICD-10-CM | POA: Insufficient documentation

## 2022-11-05 MED ORDER — AMIODARONE HCL 200 MG PO TABS: ORAL_TABLET | ORAL | 0 refills | Status: DC

## 2022-11-05 NOTE — Patient Instructions (Addendum)
STOP multaq   Tomorrow -- Start Amiodarone 200mg  -- take 1 tablet by mouth twice a day for 14 days then reduce to once a day - take with food   Cardioversion scheduled for:  Monday, June 3rd   - Arrive at the [facility] and go to admitting at   [facility] not eat or drink anything after midnight the night prior to your procedure.   - Take all your morning medication (except diabetic medications) with a sip of water prior to arrival.  - You will not be able to drive home after your procedure.    - Do NOT miss any doses of your blood thinner - if you should miss a dose please notify our office immediately.   - If you feel as if you go back into normal rhythm prior to scheduled cardioversion, please notify our office immediately.   If your procedure is canceled in the cardioversion suite you will be charged a cancellation fee.

## 2022-11-05 NOTE — Progress Notes (Addendum)
Primary Care Physician: Wilfrid Lund, PA Primary Cardiologist: Dr. Anne Fu Primary Electrophysiologist: Dr. Ladona Ridgel Referring Physician:    MARCENE Walker is a 89 y.o. female with a history of HTN, chronic HFpEF, 1st degree AV block, LBBB, aortic atherosclerosis, chronic back pain, CKD stage 2, and persistent atrial fibrillation who presents for consultation in the [facility].  The patient was initially diagnosed with atrial fibrillation in 2002. History of digoxin and metoprolol converted to NSR but later metoprolol discontinued due to dizziness and hypotension. History of amiodarone discontinued due to eye deposits seen by ophthalmology in 2021. She is currently on Multaq 400 mg BID which was started in 2023. She was recently admitted 4/17-19/24 due to chest pain and shortness of breath found to be in Afib with RVR. Discharged on lasix 20 mg daily and Multaq 400 mg BID. Patient is on edoxaban (Savaysa) 30 mg daily for a CHADS2VASC score of 5.  On evaluation 10/20/22, she is currently in Afib. She states she feels a little tired but otherwise no shortness of breath or chest pain. She can do her day to day activities but it takes her a little longer.   She is compliant with anticoagulation and has not missed any doses. She has no bleeding concerns.  On follow up 11/05/22, she is currently in rate controlled Afib. She is s/p DCCV on 10/29/22 with successful conversion to NSR x 1 shock. Overall, she has felt much better compared to before cardioversion. She notes to feel tired more so when in Afib but can do more of her daily activities since cardioversion. She has been compliant with Multaq and Savaysa. No chest pain or shortness of breath.  Today, she denies symptoms of palpitations, chest pain, shortness of breath, orthopnea, PND, lower extremity edema, dizziness, presyncope, syncope, snoring, daytime somnolence, bleeding, or neurologic sequela. The patient is tolerating  medications without difficulties and is otherwise without complaint today.   Atrial Fibrillation Risk Factors:  she does not have symptoms or diagnosis of sleep apnea. she does not have a history of rheumatic fever. she does not have a history of alcohol use. The patient does not have a history of early familial atrial fibrillation or other arrhythmias.  she has a BMI of Body mass index is 23.24 kg/m.[name] Filed Weights   11/05/22 1410  Weight: 54 kg     Family History  Problem Relation Age of Onset   Heart disease Mother    High blood pressure Mother    COPD Mother    Heart failure Mother    Heart disease Father    Heart disease Brother    Heart failure Brother    Hyperlipidemia Daughter    Hypertension Daughter      Atrial Fibrillation Management history:  Previous antiarrhythmic drugs: Amiodarone stopped due to eye deposits. Previous cardioversions: 2002, 10/29/22 Previous ablations: None Anticoagulation history: Savaysa   Past Medical History:  Diagnosis Date   Allergy    Arrhythmia    Atrial fibrillation (HCC)    GERD (gastroesophageal reflux disease)    History of bone scan    Bone Density Scans 2017 & 2019 Dr. Dutch Quint    History of chest x-ray    Apart from large hiatus hernia, normal heart, lungs and mediastinum. Per records from [facility]     History of CT scan of abdomen 10/24/2017   Gallstones within a thin-waled gallbladder, Per records from [facility]    History of echocardiogram  [facility] &  following   [facility]    History of EKG    Sinus rhythm, borderline 1st deg block, LAD completely changed axis, LBBB(old). Per records from [facility]    Hyperlipidemia    Hypertension    Insomnia    Left lower lobe pneumonia 09/20/2015   Per records from [facility]    Nodule of right lung    Per records from [facility],    Osteoporosis    on fosamax   Osteoporosis     Reduced mobility    Past Surgical History:  Procedure Laterality Date   APPENDECTOMY  [address], [place]    CARDIOVERSION N/A 10/29/2022   Procedure: CARDIOVERSION;  Surgeon: Thurmon Fair, MD;  Location: [facility] INVASIVE CV LAB;  Service: Cardiovascular;  Laterality: N/A;   CT SCAN     2017, 2018, 2019 -- re lungs see records    TUBAL LIGATION  1978   [place], [place], [place]     Current Outpatient Medications  Medication Sig Dispense Refill   acetaminophen (TYLENOL) 500 MG tablet Take 500 mg by mouth 3 (three) times daily.     amiodarone (PACERONE) 200 MG tablet Take 1 tablet by mouth twice a day for 14 days then reduce to 1 tablet daily 45 tablet 0   atorvastatin (LIPITOR) 20 MG tablet TAKE 1 TABLET(20 MG) BY MOUTH AT BEDTIME 90 tablet 3   bisacodyl (DULCOLAX) 5 MG EC tablet Take 10 mg by mouth at bedtime.     Calcium-Phosphorus-Vitamin D (CALCIUM/D3 ADULT GUMMIES PO) Take 1 tablet by mouth 4 (four) times daily. Calcium 200 mg/ Vitamin D3 500 I.U. - Vitafusion     calcium-vitamin D (OSCAL WITH D) 500-200 MG-UNIT tablet Take 1 tablet by mouth 2 (two) times daily. 60 tablet 3   cetirizine (ZYRTEC) 10 MG tablet Take 10 mg by mouth every morning.     clotrimazole (LOTRIMIN) 1 % cream Apply 1 application. topically 2 (two) times daily as needed (fungus).     denosumab (PROLIA) 60 MG/ML SOSY injection Inject 60 mg into the skin every 6 (six) months.     diclofenac Sodium (VOLTAREN) 1 % GEL Apply 4 g topically 4 (four) times daily as needed (back pain).     edoxaban (SAVAYSA) 30 MG TABS tablet TAKE 1 TABLET(30 MG) BY MOUTH DAILY. 90 tablet 0   fluticasone (FLONASE) 50 MCG/ACT nasal spray Place 2 sprays into both nostrils daily.     furosemide (LASIX) 20 MG tablet Take 1 tablet (20 mg total) by mouth daily. 30 tablet 3   HYDROcodone Bitartrate ER 15 MG CP12 Take 15 mg by mouth 2 (two) times daily.     hydrocortisone cream 1 % Apply 1 Application topically as needed for itching.      Multiple Vitamins-Minerals (PRESERVISION AREDS 2 PO) Take 1 capsule by mouth 2 (two) times daily.     ondansetron (ZOFRAN) 4 MG tablet Take 1 tablet (4 mg total) by mouth every 8 (eight) hours as needed for nausea or vomiting. 20 tablet 0   oxyCODONE (ROXICODONE) 5 MG/5ML solution Take 2.5 mg by mouth 2 (two) times daily as needed for breakthrough pain.     polyethylene glycol (MIRALAX / GLYCOLAX) 17 g packet Take 17 g by mouth 2 (two) times daily. Hold for loose stool- uses 1/2 doses     Polyvinyl Alcohol-Povidone (REFRESH OP) Apply to eye as needed.     pregabalin (LYRICA) 25 MG capsule 75mg  in  the morning 25mg  in the afternoon and evening     shark liver oil-cocoa butter (PREPARATION H) 0.25-3-85.5 % suppository Place 1 suppository rectally as needed for hemorrhoids.     No current facility-administered medications for this encounter.    No Known Allergies  Social History   Socioeconomic History   Marital status: Widowed    Spouse name: Not on file   Number of children: Not on file   Years of education: Not on file   Highest education level: Not on file  Occupational History   Not on file  Tobacco Use   Smoking status: Never   Smokeless tobacco: Never  Vaping Use   Vaping Use: Never used  Substance and Sexual Activity   Alcohol use: Not Currently    Comment: less than one drink a week   Drug use: No   Sexual activity: Never    Birth control/protection: None  Other Topics Concern   Not on file  Social History Narrative   Social History      Diet? I watch my intake of salt, sugar, and fat      Do you drink/eat things with caffeine? Yes       Marital status?      Widowed                               What year were you married? 1960      Do you live in a house, apartment, assisted living, condo, trailer, etc.? House       Is it one or more stories? one      How many persons live in your home? 2      Do you have any pets in your home? (please list) yes 2 cats        Highest level of education completed? 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             yes                         Type & how often? Strength & Balance       Advanced Directives      Do you have a living will?  No       Do you have a DNR form?        No                           If not, do you want to discuss one? No       Do you have signed POA/HPOA for forms? Yes       Functional Status      Do you have difficulty bathing or dressing yourself? No       Do you have difficulty preparing food or eating? No       Do you have difficulty managing your medications? No       Do you have difficulty managing your finances? No       Do you have difficulty affording your medications? No       Social Determinants of Corporate investment banker Strain: Not on file  Food Insecurity: Not on file  Transportation Needs: Not on file  Physical Activity: Not on file  Stress: Not on file  Social Connections: Not on file  Intimate Partner Violence: Not on file     ROS- All systems are reviewed and negative except as per the HPI above.  Physical Exam: Vitals:   11/05/22 1410  BP: 106/64  Pulse: 91  Weight: 54 kg  Height: 5' (1.524 m)    GEN- The patient is well appearing, alert and oriented x 3 today.   Head- normocephalic, atraumatic Eyes-  Sclera clear, conjunctiva pink Ears- hearing intact Oropharynx- clear Neck- supple, no JVP Lymph- no cervical lymphadenopathy Lungs- Clear to ausculation bilaterally, normal work of breathing Heart- Irregular rate and rhythm, no murmurs, rubs or gallops, PMI not laterally displaced GI- soft, NT, ND, + BS Extremities- no clubbing, cyanosis, or edema MS- no significant deformity or atrophy Skin- no rash or lesion Psych- euthymic mood, full affect Neuro- strength and sensation are intact    Wt Readings from Last 3 Encounters:  11/05/22 54  kg  10/20/22 54.5 kg  10/17/22 54.7 kg    EKG today demonstrates  Vent. rate 91 BPM PR interval * ms QRS duration 140 ms QT/QTcB 394/484 ms P-R-T axes * 117 -50 Atrial fibrillation Right axis deviation Non-specific intra-ventricular conduction block Cannot rule out Anterior infarct , age undetermined T wave abnormality, consider inferolateral ischemia Abnormal ECG When compared with ECG of 29-Oct-2022 11:14, PREVIOUS ECG IS PRESENT  Echo 10/17/22 demonstrated:  1. Left ventricular ejection fraction, by estimation, is 45 to 50%. The  left ventricle has mildly decreased function. The left ventricle  demonstrates regional wall motion abnormalities with septal hypokinesis  and septal-lateral dyssynchrony consistent  with LBBB. There is moderate asymmetric left ventricular hypertrophy of  the basal-septal segment. Left ventricular diastolic parameters are  indeterminate.   2. Right ventricular systolic function is normal. The right ventricular  size is normal. There is moderately elevated pulmonary artery systolic  pressure. The estimated right ventricular systolic pressure is 45.7 mmHg.   3. Left atrial size was severely dilated.   4. Right atrial size was severely dilated.   5. The mitral valve is abnormal. 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 No evidence of  mitral stenosis.   6. Tricuspid valve regurgitation is moderate.   7. The aortic valve is tricuspid. There is moderate calcification of the  aortic valve. Aortic valve regurgitation is mild. Aortic valve  sclerosis/calcification is present, without any evidence of aortic  stenosis. Aortic valve mean gradient measures 6.2  mmHg.   8. Aortic dilatation noted. There is mild dilatation of the descending  aorta, measuring 39 mm.   9. The inferior vena cava is normal in size with <50% respiratory  variability, suggesting right atrial pressure of 8 mmHg.  10. The patient  appeared to be in atrial fibrillation.   Epic records are reviewed at length today.  CHA2DS2-VASc Score = 5  The patient's score is based upon: CHF History: 1 HTN History: 1 Diabetes History: 0 Stroke History: 0 Vascular Disease History: 0 Age Score: 2 Gender Score: 1       ASSESSMENT AND PLAN: Persistent Atrial Fibrillation (ICD10:  I48.19) The patient's CHA2DS2-VASc score is 5, indicating a 7.2% annual risk of stroke.   S/p DCCV on 10/29/22.   She is in Afib today. Review of her notes show she was in normal rhythm for about a day or so after cardioversion before going back into Afib. We will go  ahead and determine Multaq insufficient to help with her arrhythmia.   Current guidelines for management of Afib indicate "amiodarone-related corneal microdeposits (epithelial keratopathy) are common, but visual abnormalities and light sensitivity are rare. Therefore, an ophthalmologic examination is reasonable only if visual abnormalities develop." Patient states when deposits were noted she never had any visual symptoms. We discussed option of retrying amiodarone in light of this versus Tikosyn. Patient would prefer to retry amiodarone.  We will proceed with the following plan:  - Stop Multaq. - Amiodarone 200 mg BID x 14 days, then once daily. - Schedule DCCV - Repeat ECG and DCCV labs in 2 weeks.  We can still consider Tikosyn in the future. Estimated CrCl of 45 with Creatine 0.72 on 4/29 would place her on Tikosyn 250 mcg dose so this can be a future option.   2. Secondary Hypercoagulable State (ICD10:  D68.69) The patient is at significant risk for stroke/thromboembolism based upon her CHA2DS2-VASc Score of 5.  Continue Savaysa.  No missed doses.   3. HTN Stable today, no changes to current regimen.   Repeat ECG and labs 2 weeks prior to DCCV. F/u 1-2 weeks after DCCV.   Lake Bells, PA-C Afib Clinic [facility] [address] [place], [place]  [address] [phone] 11/05/2022 3:11 PM

## 2022-11-06 ENCOUNTER — Ambulatory Visit (HOSPITAL_COMMUNITY): Payer: Medicare Other | Admitting: Internal Medicine

## 2022-11-07 ENCOUNTER — Ambulatory Visit (HOSPITAL_COMMUNITY): Payer: Medicare Other | Admitting: Internal Medicine

## 2022-11-21 ENCOUNTER — Ambulatory Visit (HOSPITAL_COMMUNITY)
Admission: RE | Admit: 2022-11-21 | Discharge: 2022-11-21 | Disposition: A | Discharge status: Home / Self Care | Payer: Medicare Other | Source: Ambulatory Visit | Attending: Internal Medicine | Admitting: Internal Medicine

## 2022-11-21 VITALS — HR 99

## 2022-11-21 DIAGNOSIS — Z79899 Other long term (current) drug therapy: Secondary | ICD-10-CM | POA: Insufficient documentation

## 2022-11-21 DIAGNOSIS — I480 Paroxysmal atrial fibrillation: Secondary | ICD-10-CM | POA: Diagnosis present

## 2022-11-21 DIAGNOSIS — I4891 Unspecified atrial fibrillation: Secondary | ICD-10-CM

## 2022-11-21 LAB — BASIC METABOLIC PANEL
Anion gap: 8 (ref 5–15)
BUN: 8 mg/dL (ref 8–23)
CO2: 31 mmol/L (ref 22–32)
Calcium: 9.4 mg/dL (ref 8.9–10.3)
Chloride: 98 mmol/L (ref 98–111)
Creatinine, Ser: 0.73 mg/dL (ref 0.44–1.00)
GFR, Estimated: >60 mL/min (ref >60)
Glucose, Bld: 104 mg/dL — ABNORMAL HIGH (ref 70–99)
Potassium: 4.1 mmol/L (ref 3.5–5.1)
Sodium: 137 mmol/L (ref 135–145)

## 2022-11-21 LAB — CBC
HCT: 41 % (ref 36.0–46.0)
Hemoglobin: 13 g/dL (ref 12.0–15.0)
MCH: 27.5 pg (ref 26.0–34.0)
MCHC: 31.7 g/dL (ref 30.0–36.0)
MCV: 86.7 fL (ref 80.0–100.0)
Platelets: 290 10*3/uL (ref 150–400)
RBC: 4.73 MIL/uL (ref 3.87–5.11)
RDW: 15.3 % (ref 11.5–15.5)
WBC: 5.5 10*3/uL (ref 4.0–10.5)
nRBC: 0 % (ref 0.0–0.2)

## 2022-11-21 NOTE — H&P (View-Only) (Signed)
Patient is currently taking amiodarone 200 mg once daily. She is rate controlled Afib today.  DCCV labs drawn today.  ECG today shows  Vent. rate 99 BPM PR interval * ms QRS duration 146 ms QT/QTcB 354/454 ms P-R-T axes * 60 -74 Atrial fibrillation Indeterminate axis Left bundle branch block Abnormal ECG When compared with ECG of 05-Nov-2022 14:32, PREVIOUS ECG IS PRESENT   Pt will follow up 1-2 weeks after DCCV.   

## 2022-11-21 NOTE — Progress Notes (Signed)
Patient is currently taking amiodarone 200 mg once daily. She is rate controlled Afib today.  DCCV labs drawn today.  ECG today shows  Vent. rate 99 BPM PR interval * ms QRS duration 146 ms QT/QTcB 354/454 ms P-R-T axes * 60 -74 Atrial fibrillation Indeterminate axis Left bundle branch block Abnormal ECG When compared with ECG of 05-Nov-2022 14:32, PREVIOUS ECG IS PRESENT   Pt will follow up 1-2 weeks after DCCV.

## 2022-11-25 ENCOUNTER — Telehealth: Payer: Self-pay | Admitting: Nurse Practitioner

## 2022-11-25 NOTE — Telephone Encounter (Signed)
Patient's daughter called wanting to know if it would be okay to move out her appt that she has scheduled for 6/5 with Marcelino Duster to August.  Patient has appt with the A-fib clinic and is having an ablation done next week on Monday, then has a follow up with them two week after that.  Please advise.

## 2022-11-25 NOTE — Telephone Encounter (Signed)
Sounds reasonable. Would recommend that you send to Dr. Anne Fu for agreement.

## 2022-11-25 NOTE — Telephone Encounter (Signed)
Pt is scheduled with Marcelino Duster p/hosp to f/u At Fib and volume overload.  She is scheduled for cardioversion 12/01/22 and then At Fib clinic 6/19.  Will verify OK with Dr Anne Fu to delay in office f/u until August as requested.

## 2022-11-26 ENCOUNTER — Other Ambulatory Visit (HOSPITAL_COMMUNITY): Payer: Self-pay | Admitting: *Deleted

## 2022-11-26 MED ORDER — AMIODARONE HCL 200 MG PO TABS: 200.0000 mg | ORAL_TABLET | Freq: Every day | ORAL | 2 refills | Status: DC

## 2022-11-27 NOTE — Telephone Encounter (Signed)
Spoke with patient's daughter, Darel Hong to confirm cancellation of appointment on 12/03/22. Patient is scheduled for cardioversion on 12/01/22 and follow up in Afib clinic on 6/19.

## 2022-11-27 NOTE — Telephone Encounter (Signed)
Daughter is following-up to check if it is OK for the patient to cancel her 6/3 visit and have later OV.

## 2022-11-28 NOTE — Progress Notes (Signed)
Spoke to pts daughter and POA and notified her to be at hospital at 0830. Instructed to be NPO after 0000 on Sunday. Confirmed pt has had no breaks in taking blood thinner. Daughter stated that she will be her ride and will be with her for 24 hours after the procedure.

## 2022-12-01 ENCOUNTER — Ambulatory Visit (HOSPITAL_COMMUNITY)
Admission: RE | Admit: 2022-12-01 | Discharge: 2022-12-01 | Disposition: A | Discharge status: Home / Self Care | Payer: Medicare Other | Attending: Internal Medicine | Admitting: Internal Medicine

## 2022-12-01 ENCOUNTER — Encounter (HOSPITAL_COMMUNITY)
Admission: RE | Disposition: A | Discharge status: Home / Self Care | Payer: Self-pay | Source: Home / Self Care | Attending: Internal Medicine

## 2022-12-01 ENCOUNTER — Encounter (HOSPITAL_COMMUNITY): Payer: Self-pay | Admitting: Internal Medicine

## 2022-12-01 ENCOUNTER — Ambulatory Visit (HOSPITAL_BASED_OUTPATIENT_CLINIC_OR_DEPARTMENT_OTHER): Payer: Medicare Other | Admitting: Anesthesiology

## 2022-12-01 ENCOUNTER — Ambulatory Visit (HOSPITAL_COMMUNITY): Payer: Medicare Other | Admitting: Anesthesiology

## 2022-12-01 ENCOUNTER — Other Ambulatory Visit: Payer: Self-pay

## 2022-12-01 DIAGNOSIS — I509 Heart failure, unspecified: Secondary | ICD-10-CM

## 2022-12-01 DIAGNOSIS — I4891 Unspecified atrial fibrillation: Secondary | ICD-10-CM | POA: Insufficient documentation

## 2022-12-01 DIAGNOSIS — I110 Hypertensive heart disease with heart failure: Secondary | ICD-10-CM

## 2022-12-01 DIAGNOSIS — I4819 Other persistent atrial fibrillation: Secondary | ICD-10-CM | POA: Diagnosis not present

## 2022-12-01 DIAGNOSIS — K449 Diaphragmatic hernia without obstruction or gangrene: Secondary | ICD-10-CM

## 2022-12-01 HISTORY — PX: CARDIOVERSION: SHX1299

## 2022-12-01 SURGERY — CARDIOVERSION
Anesthesia: General

## 2022-12-01 MED ORDER — SODIUM CHLORIDE 0.9 % IV SOLN: INTRAVENOUS | Status: DC

## 2022-12-01 MED ORDER — LIDOCAINE 2% (20 MG/ML) 5 ML SYRINGE
INTRAMUSCULAR | Status: DC | PRN
  Administered 2022-12-01: 60 mg via INTRAVENOUS

## 2022-12-01 MED ORDER — PROPOFOL 10 MG/ML IV BOLUS
INTRAVENOUS | Status: DC | PRN
  Administered 2022-12-01: 40 mg via INTRAVENOUS

## 2022-12-01 SURGICAL SUPPLY — 1 items: ELECT DEFIB PAD ADLT CADENCE (PAD) ×1 IMPLANT

## 2022-12-01 NOTE — Interval H&P Note (Signed)
History and Physical Interval Note:  12/01/2022 9:16 AM  Tammy Walker  has presented today for surgery, with the diagnosis of AFIB.  The various methods of treatment have been discussed with the patient and family. After consideration of risks, benefits and other options for treatment, the patient has consented to  Procedure(s): CARDIOVERSION (N/A) as a surgical intervention.  The patient's history has been reviewed, patient examined, no change in status, stable for surgery.  I have reviewed the patient's chart and labs.  Questions were answered to the patient's satisfaction.     [name] A Yajaira Doffing

## 2022-12-01 NOTE — Anesthesia Postprocedure Evaluation (Signed)
Anesthesia Post Note  Patient: Tammy Walker  Procedure(s) Performed: CARDIOVERSION     Patient location during evaluation: PACU Anesthesia Type: General Level of consciousness: awake and alert Pain management: pain level controlled Vital Signs Assessment: post-procedure vital signs reviewed and stable Respiratory status: spontaneous breathing, nonlabored ventilation and respiratory function stable Cardiovascular status: blood pressure returned to baseline Postop Assessment: no apparent nausea or vomiting Anesthetic complications: no   No notable events documented.  Last Vitals:  Vitals:   12/01/22 1010 12/01/22 1016  BP: (P) 138/61 (P) 132/69  Pulse:    Resp:    Temp:  36.7 C  SpO2:      Last Pain:  Vitals:   12/01/22 1016  TempSrc: Temporal  PainSc: 0-No pain                 Shanda Howells

## 2022-12-01 NOTE — Anesthesia Preprocedure Evaluation (Addendum)
Anesthesia Evaluation  Patient identified by MRN, date of birth, ID band Patient awake    Reviewed: Allergy & Precautions, NPO status , Patient's Chart, lab work & pertinent test results  History of Anesthesia Complications Negative for: history of anesthetic complications  Airway Mallampati: II  TM Distance: >3 FB Neck ROM: Full    Dental no notable dental hx.    Pulmonary neg pulmonary ROS   Pulmonary exam normal        Cardiovascular hypertension, +CHF  Normal cardiovascular exam+ dysrhythmias Atrial Fibrillation   TTE 10/17/22: EF 45-50%, septal hypokinesis and septal-lateral dyssynchrony consistent with LBBB, moderate LVH of basal-septal segment, moderate pHTN (RVSP 45.7 mmHg), severe RAE/LAE, moderate to severe MR, moderate TR, mild AR, mild dilatation of descending aorta measuring 39mm    Neuro/Psych negative neurological ROS     GI/Hepatic Neg liver ROS, hiatal hernia,GERD  ,,  Endo/Other  negative endocrine ROS    Renal/GU negative Renal ROS     Musculoskeletal negative musculoskeletal ROS (+)    Abdominal   Peds  Hematology negative hematology ROS (+)   Anesthesia Other Findings Day of surgery medications reviewed with patient.  Reproductive/Obstetrics                             Anesthesia Physical Anesthesia Plan  ASA: 4  Anesthesia Plan: General   Post-op Pain Management: Minimal or no pain anticipated   Induction: Intravenous  PONV Risk Score and Plan: Treatment may vary due to age or medical condition and Propofol infusion  Airway Management Planned: Mask  Additional Equipment: None  Intra-op Plan:   Post-operative Plan:   Informed Consent: I have reviewed the patients History and Physical, chart, labs and discussed the procedure including the risks, benefits and alternatives for the proposed anesthesia with the patient or authorized representative who has  indicated his/her understanding and acceptance.     Dental advisory given  Plan Discussed with: CRNA  Anesthesia Plan Comments:        Anesthesia Quick Evaluation

## 2022-12-01 NOTE — Transfer of Care (Signed)
Immediate Anesthesia Transfer of Care Note  Patient: Tammy Walker  Procedure(s) Performed: CARDIOVERSION  Patient Location: Cath Lab  Anesthesia Type:General  Level of Consciousness: drowsy and patient cooperative  Airway & Oxygen Therapy: Patient Spontanous Breathing  Post-op Assessment: Report given to RN and Post -op Vital signs reviewed and stable  Post vital signs: Reviewed and stable  Last Vitals:  Vitals Value Taken Time  BP    Temp    Pulse 80 12/01/22 0956  Resp 22 12/01/22 0956  SpO2 99 % 12/01/22 0956  Vitals shown include unvalidated device data.  Last Pain: There were no vitals filed for this visit.       Complications: No notable events documented.

## 2022-12-01 NOTE — CV Procedure (Signed)
    Electrical Cardioversion Procedure Note Tammy Walker [id number] Mar 19, 1931  Procedure: Electrical Cardioversion Indications:  Atrial Fibrillation  Time Out: Verified patient identification, verified procedure,medications/allergies/relevent history reviewed, required imaging and test results available.  Performed  Procedure Details  The patient was NPO after midnight. Anesthesia was administered at the beside  by Dr.Howzse and team.  Cardioversion was done with synchronized biphasic defibrillation with AP pads with 200 Joules.  The patient converted to sinus rhythm with 1st heart block. The patient tolerated the procedure well.  IMPRESSION:  Successful cardioversion of atrial fibrillation.   Tammy Lam, MD FASE [facility] Cardiologist [facility]  [address], [address] [place], [place] [address] [phone]  10:07 AM

## 2022-12-01 NOTE — Discharge Instructions (Signed)

## 2022-12-02 ENCOUNTER — Encounter (HOSPITAL_COMMUNITY): Payer: Self-pay | Admitting: Internal Medicine

## 2022-12-03 ENCOUNTER — Ambulatory Visit: Payer: Medicare Other | Admitting: Nurse Practitioner

## 2022-12-10 ENCOUNTER — Other Ambulatory Visit: Payer: Self-pay | Admitting: Nurse Practitioner

## 2022-12-10 DIAGNOSIS — I4819 Other persistent atrial fibrillation: Secondary | ICD-10-CM

## 2022-12-10 NOTE — Telephone Encounter (Signed)
Prescription refill request for [facility] received.  Indication: Afib  Last office visit: 11/05/22 Tammy Walker)  Weight: 54kg Age:89 Scr: 0.73 (11/01/22)  CrCl: 44.6ml/min  Appropriate dose. Refill sent.

## 2022-12-17 ENCOUNTER — Ambulatory Visit (HOSPITAL_COMMUNITY)
Admission: RE | Admit: 2022-12-17 | Discharge: 2022-12-17 | Disposition: A | Discharge status: Home / Self Care | Payer: Medicare Other | Source: Ambulatory Visit | Attending: Internal Medicine | Admitting: Internal Medicine

## 2022-12-17 VITALS — BP 102/62 | HR 84 | Ht 60.0 in | Wt 115.2 lb

## 2022-12-17 DIAGNOSIS — I4819 Other persistent atrial fibrillation: Secondary | ICD-10-CM | POA: Insufficient documentation

## 2022-12-17 DIAGNOSIS — Z79899 Other long term (current) drug therapy: Secondary | ICD-10-CM | POA: Insufficient documentation

## 2022-12-17 DIAGNOSIS — I700 Atherosclerosis of aorta: Secondary | ICD-10-CM | POA: Insufficient documentation

## 2022-12-17 DIAGNOSIS — Z7901 Long term (current) use of anticoagulants: Secondary | ICD-10-CM | POA: Insufficient documentation

## 2022-12-17 DIAGNOSIS — I130 Hypertensive heart and chronic kidney disease with heart failure and stage 1 through stage 4 chronic kidney disease, or unspecified chronic kidney disease: Secondary | ICD-10-CM | POA: Insufficient documentation

## 2022-12-17 DIAGNOSIS — I5032 Chronic diastolic (congestive) heart failure: Secondary | ICD-10-CM | POA: Diagnosis not present

## 2022-12-17 DIAGNOSIS — I4892 Unspecified atrial flutter: Secondary | ICD-10-CM | POA: Diagnosis not present

## 2022-12-17 DIAGNOSIS — D6869 Other thrombophilia: Secondary | ICD-10-CM | POA: Insufficient documentation

## 2022-12-17 DIAGNOSIS — N182 Chronic kidney disease, stage 2 (mild): Secondary | ICD-10-CM | POA: Insufficient documentation

## 2022-12-17 NOTE — Progress Notes (Addendum)
Primary Care Physician: Tammy Lund, PA Primary Cardiologist: Dr. Anne Walker Primary Electrophysiologist: Dr. Ladona Walker Referring Physician:    [name] is a 89 y.o. female with a history of HTN, chronic HFpEF, 1st degree AV block, LBBB, aortic atherosclerosis, chronic back pain, CKD stage 2, and persistent atrial fibrillation who presents for consultation in the [facility].  The patient was initially diagnosed with atrial fibrillation in 2002. History of digoxin and metoprolol converted to NSR but later metoprolol discontinued due to dizziness and hypotension. History of amiodarone discontinued due to eye deposits seen by ophthalmology in 2021. She is currently on Multaq 400 mg BID which was started in 2023. She was recently admitted 4/17-19/24 due to chest pain and shortness of breath found to be in Afib with RVR. Discharged on lasix 20 mg daily and Multaq 400 mg BID. Patient is on edoxaban (Savaysa) 30 mg daily for a CHADS2VASC score of 5.  On evaluation 10/20/22, she is currently in Afib. She states she feels a little tired but otherwise no shortness of breath or chest pain. She can do her day to day activities but it takes her a little longer.   She is compliant with anticoagulation and has not missed any doses. She has no bleeding concerns.  On follow up 11/05/22, she is currently in rate controlled Afib. She is s/p DCCV on 10/29/22 with successful conversion to NSR x 1 shock. Overall, she has felt much better compared to before cardioversion. She notes to feel tired more so when in Afib but can do more of her daily activities since cardioversion. She has been compliant with Multaq and Savaysa. No chest pain or shortness of breath.  On follow up 12/17/22, she is currently in rate controlled atrial flutter. She is s/p successful DCCV on 12/01/22 currently taking amiodarone 200 mg daily. Daughter with patient here today notes the day after cardioversion she was back out  of rhythm. Patient admits she does not readily feel any different now compared to day of cardioversion. She is tired but also has pain issues which affects her sleep. Her back pain specifically makes her stop walking and sit down to rest and recover. She is compliant with amiodarone 200 mg daily and Savaysa.   Today, she denies symptoms of palpitations, chest pain, shortness of breath, orthopnea, PND, lower extremity edema, dizziness, presyncope, syncope, snoring, daytime somnolence, bleeding, or neurologic sequela. The patient is tolerating medications without difficulties and is otherwise without complaint today.   Atrial Fibrillation Risk Factors:  she does not have symptoms or diagnosis of sleep apnea. she does not have a history of rheumatic fever. she does not have a history of alcohol use. The patient does not have a history of early familial atrial fibrillation or other arrhythmias.  she has a BMI of Body mass index is 22.5 kg/m.[name] Filed Weights   12/17/22 1357  Weight: 52.3 kg    Family History  Problem Relation Age of Onset   Heart disease Mother    High blood pressure Mother    COPD Mother    Heart failure Mother    Heart disease Father    Heart disease Brother    Heart failure Brother    Hyperlipidemia Daughter    Hypertension Daughter     Atrial Fibrillation Management history:  Previous antiarrhythmic drugs: Amiodarone stopped due to eye deposits. Previous cardioversions: 2002, 10/29/22 Previous ablations: None Anticoagulation history: Savaysa   Past Medical History:  Diagnosis Date  Allergy    Arrhythmia    Atrial fibrillation (HCC)    GERD (gastroesophageal reflux disease)    History of bone scan    Bone Density Scans 2017 & 2019 Dr. Dutch Quint    History of chest x-ray    Apart from large hiatus hernia, normal heart, lungs and mediastinum. Per records from [facility]     History of CT scan of abdomen 10/24/2017   Gallstones within a  thin-waled gallbladder, Per records from [facility]    History of echocardiogram    [facility]    History of EKG    Sinus rhythm, borderline 1st deg block, LAD completely changed axis, LBBB(old). Per records from [facility]    Hyperlipidemia    Hypertension    Insomnia    Left lower lobe pneumonia 09/20/2015   Per records from [facility]    Nodule of right lung    Per records from [facility],    Osteoporosis    on fosamax   Osteoporosis    Reduced mobility    Past Surgical History:  Procedure Laterality Date   APPENDECTOMY  [address], [place]    CARDIOVERSION N/A 10/29/2022   Procedure: CARDIOVERSION;  Surgeon: Thurmon Fair, MD;  Location: [facility] INVASIVE CV LAB;  Service: Cardiovascular;  Laterality: N/A;   CARDIOVERSION N/A 12/01/2022   Procedure: CARDIOVERSION;  Surgeon: Christell Constant, MD;  Location: [facility] INVASIVE CV LAB;  Service: Cardiovascular;  Laterality: N/A;   CT SCAN     2017, 2018, 2019 -- re lungs see records    TUBAL LIGATION  1978   [place] [place], [place]     Current Outpatient Medications  Medication Sig Dispense Refill   acetaminophen (TYLENOL) 500 MG tablet Take 500 mg by mouth See admin instructions. Take 500 mg 3 times daily may take an additional 500 mg dose as needed for pain     amiodarone (PACERONE) 200 MG tablet Take 1 tablet (200 mg total) by mouth daily. 90 tablet 2   atorvastatin (LIPITOR) 20 MG tablet TAKE 1 TABLET(20 MG) BY MOUTH AT BEDTIME 90 tablet 3   bisacodyl (DULCOLAX) 5 MG EC tablet Take 10 mg by mouth at bedtime.     Ca Phosphate-Cholecalciferol (CALCIUM WITH D3) 250-12.5 MG-MCG CHEW Chew 2 tablets by mouth 2 (two) times daily.     calcium-vitamin D (OSCAL WITH D) 500-200 MG-UNIT tablet Take 1 tablet by mouth 2 (two) times daily. 60 tablet 3   carboxymethylcellulose (REFRESH TEARS) 0.5 % SOLN Place 1 drop into both eyes 3 (three) times  daily as needed (dry eyes).     cetirizine (ZYRTEC) 10 MG tablet Take 10 mg by mouth every morning.     clotrimazole (LOTRIMIN) 1 % cream Apply 1 application. topically 2 (two) times daily as needed (fungus).     denosumab (PROLIA) 60 MG/ML SOSY injection Inject 60 mg into the skin every 6 (six) months.     diclofenac Sodium (VOLTAREN) 1 % GEL Apply 4 g topically 4 (four) times daily as needed (back pain).     edoxaban (SAVAYSA) 30 MG TABS tablet TAKE 1 TABLET(30 MG) BY MOUTH DAILY 90 tablet 1   fluticasone (FLONASE) 50 MCG/ACT nasal spray Place 2 sprays into both nostrils daily as needed for allergies.     furosemide (LASIX) 20 MG tablet Take 1 tablet (20 mg total) by mouth daily. 30 tablet 3   HYDROcodone Bitartrate  ER 15 MG CP12 Take 15 mg by mouth 2 (two) times daily.     Hydrocortisone (PREPARATION H EX) Apply 1 Application topically daily as needed (hemorrhoids).     Multiple Vitamins-Minerals (PRESERVISION AREDS 2 PO) Take 1 capsule by mouth 2 (two) times daily.     ondansetron (ZOFRAN) 4 MG tablet Take 1 tablet (4 mg total) by mouth every 8 (eight) hours as needed for nausea or vomiting. 20 tablet 0   oxyCODONE (ROXICODONE) 5 MG/5ML solution Take 2.5 mLs by mouth 2 (two) times daily as needed for breakthrough pain.     Polyethyl Glycol-Propyl Glycol (SYSTANE) 0.4-0.3 % GEL ophthalmic gel Place 1 Application into both eyes at bedtime.     polyethylene glycol (MIRALAX / GLYCOLAX) 17 g packet Take 8.5 g by mouth 2 (two) times daily.     pregabalin (LYRICA) 25 MG capsule Take 25 mg by mouth See admin instructions. Taking 75 mg by mouth in the morning, 25 mg afternoon and 25 mg in the evening     pregabalin (LYRICA) 75 MG capsule Take 75 mg by mouth daily.     shark liver oil-cocoa butter (PREPARATION H) 0.25-3-85.5 % suppository Place 1 suppository rectally as needed for hemorrhoids.     No current facility-administered medications for this encounter.    No Known Allergies  Social History    Socioeconomic History   Marital status: Widowed    Spouse name: Not on file   Number of children: Not on file   Years of education: Not on file   Highest education level: Not on file  Occupational History   Not on file  Tobacco Use   Smoking status: Never   Smokeless tobacco: Never  Vaping Use   Vaping Use: Never used  Substance and Sexual Activity   Alcohol use: Not Currently    Comment: less than one drink a week   Drug use: No   Sexual activity: Never    Birth control/protection: None  Other Topics Concern   Not on file  Social History Narrative   Social History      Diet? I watch my intake of salt, sugar, and fat      Do you drink/eat things with caffeine? Yes       Marital status?      Widowed                               What year were you married? 1960      Do you live in a house, apartment, assisted living, condo, trailer, etc.? House       Is it one or more stories? one      How many persons live in your home? 2      Do you have any pets in your home? (please list) yes 2 cats       Highest level of education completed? 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             yes                         Type & how often? Strength & Balance       Advanced Directives      Do you have a  living will?  No       Do you have a DNR form?        No                           If not, do you want to discuss one? No       Do you have signed POA/HPOA for forms? Yes       Functional Status      Do you have difficulty bathing or dressing yourself? No       Do you have difficulty preparing food or eating? No       Do you have difficulty managing your medications? No       Do you have difficulty managing your finances? No       Do you have difficulty affording your medications? No       Social Determinants of Corporate investment banker Strain: Not on file  Food  Insecurity: Not on file  Transportation Needs: Not on file  Physical Activity: Not on file  Stress: Not on file  Social Connections: Not on file  Intimate Partner Violence: Not on file    ROS- All systems are reviewed and negative except as per the HPI above.  Physical Exam: Vitals:   12/17/22 1357  BP: 102/62  Pulse: 84  Weight: 52.3 kg  Height: 5' (1.524 m)    GEN- The patient is well appearing, alert and oriented x 3 today.   Head- normocephalic, atraumatic Eyes-  Sclera clear, conjunctiva pink Ears- hearing intact Lungs- Clear to ausculation bilaterally, normal work of breathing Heart- Irregular rate and rhythm, no murmurs, rubs or gallops, PMI not laterally displaced Extremities- no clubbing, cyanosis, or edema MS- no significant deformity or atrophy Skin- no rash or lesion Psych- euthymic mood, full affect Neuro- strength and sensation are intact   Wt Readings from Last 3 Encounters:  12/17/22 52.3 kg  11/05/22 54 kg  10/20/22 54.5 kg   EKG today demonstrates  Vent. rate 84 BPM PR interval * ms QRS duration 142 ms QT/QTcB 414/489 ms P-R-T axes * 146 -35 Atrial flutter with variable A-V block Right axis deviation Non-specific intra-ventricular conduction block Cannot rule out Anterior infarct , age undetermined T wave abnormality, consider inferolateral ischemia Abnormal ECG When compared with ECG of 01-Dec-2022 10:14, PREVIOUS ECG IS PRESENT  Echo 10/17/22 demonstrated:  1. Left ventricular ejection fraction, by estimation, is 45 to 50%. The  left ventricle has mildly decreased function. The left ventricle  demonstrates regional wall motion abnormalities with septal hypokinesis  and septal-lateral dyssynchrony consistent  with LBBB. There is moderate asymmetric left ventricular hypertrophy of  the basal-septal segment. Left ventricular diastolic parameters are  indeterminate.   2. Right ventricular systolic function is normal. The right ventricular   size is normal. There is moderately elevated pulmonary artery systolic  pressure. The estimated right ventricular systolic pressure is 45.7 mmHg.   3. Left atrial size was severely dilated.   4. Right atrial size was severely dilated.   5. The mitral valve is abnormal. 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 No evidence of  mitral stenosis.   6. Tricuspid valve regurgitation is moderate.   7. The aortic valve is tricuspid. There is moderate calcification of the  aortic valve. Aortic valve regurgitation is mild. Aortic valve  sclerosis/calcification is present, without any evidence of aortic  stenosis. Aortic  valve mean gradient measures 6.2  mmHg.   8. Aortic dilatation noted. There is mild dilatation of the descending  aorta, measuring 39 mm.   9. The inferior vena cava is normal in size with <50% respiratory  variability, suggesting right atrial pressure of 8 mmHg.  10. The patient appeared to be in atrial fibrillation.   Epic records are reviewed at length today.  CHA2DS2-VASc Score = 5  The patient's score is based upon: CHF History: 1 HTN History: 1 Diabetes History: 0 Stroke History: 0 Vascular Disease History: 0 Age Score: 2 Gender Score: 1       ASSESSMENT AND PLAN: Persistent Atrial Fibrillation (ICD10:  I48.19) / Atrial flutter The patient's CHA2DS2-VASc score is 5, indicating a 7.2% annual risk of stroke.   S/p DCCV on 10/29/22.  S/p DCCV on 12/01/22 with successful conversion to NSR on amiodarone 200 mg daily.  She is in rate controlled atrial flutter. We discussed current options going forward including the following:  -continue amiodarone for rate control -can consider Tikosyn although with current amiodarone failure it is unlikely to help -discuss AV nodal ablation with EP  They would like to discuss this her cardiologist and will try to reschedule existing cardiology appt with primary cardiologist. I will  see them in November, sooner per clinical course.  Current guidelines for management of Afib indicate "amiodarone-related corneal microdeposits (epithelial keratopathy) are common, but visual abnormalities and light sensitivity are rare. Therefore, an ophthalmologic examination is reasonable only if visual abnormalities develop." Patient states when deposits were noted she never had any visual symptoms.   We can still consider Tikosyn in the future although low likelihood of success given amiodarone failure. Estimated CrCl of 43 with Creatine 0.7 on 5/24 would place her on Tikosyn 250 mcg dose so this can be a future option.   2. Secondary Hypercoagulable State (ICD10:  D68.69) The patient is at significant risk for stroke/thromboembolism based upon her CHA2DS2-VASc Score of 5.  Continue Savaysa.  No missed doses.   3. HTN Stable today, no changes to current regimen.   F/u in November after cardiologist appt.   Lake Bells, PA-C Afib Clinic [facility] [address] [place], [place] [address] [phone] 12/17/2022 3:52 PM

## 2023-02-07 NOTE — Progress Notes (Signed)
Cardiology Office Note:  .   Date:  02/09/2023  ID:  Tammy Walker, DOB 20-Feb-1931, MRN [id number] PCP: Wilfrid Lund, PA  [facility] Providers Cardiologist:  Donato Schultz, MD    Patient Profile: .      PMH Aortic atherosclerosis Atrial fibrillation Initially diagnosed in 2002, treated with amiodarone Had cardioversion early in treatment LBBB Aortic stenosis Chronic HFpEF Hypertension Chronic HFpEF First degree AV block Left BBB  She moved to [place] from [place] after her husband died, grew up in [place]. Now resides with her daughter. Established with cardiology, Dr. Anne Fu, 05/23/2019 and described occasional substernal chest discomfort relieved by hot water bottle or ibuprofen gel that had been going on for many years.  She took amiodarone for several years.  Amiodarone was stopped in March 2021 because of eye changes seen by ophthalmology. Unfortunately, she had return of a fib RVR March 2022 requiring hospitalization.   Echocardiogram 03/2021 showed EF 60% with mild aortic stenosis and mild aortic regurgitation with dilated ascending aorta.  Mild to moderate mitral regurgitation.  She has considerable bone pain including pain in her chest. Metoprolol was stopped due to hypotension.   Admission 4/17-4/19/24 to chest pain and shortness of breath found to be in A-fib RVR.  She was discharged on Lasix 20 mg daily and Multaq 400 mg twice daily.  She is on edoxaban 30 mg daily for CHA2DS2-VASc score of 5.  She underwent DCCV on 10/29/2022 with successful conversion to NSR after 1 shock.  She overall was feeling much better.  Echo 10/17/22 revealed LVEF 45 to 50%, septal hypokinesis and septal lateral dyssynchrony consistent with LBBB, moderate asymmetric LVH and indeterminate diastolic parameters, moderately elevated PASP at 45.7 mmHg, severe biatrial enlargement, moderate to severe MR, likely atrial functional MR with dilated LA, moderate TR, mild AI, aortic sclerosis with no  evidence of aortic stenosis.   Seen in follow-up by Lake Bells, PA, in A-fib clinic on 12/17/2022 and found to be in rate controlled atrial flutter.  She underwent DCCV on 12/01/2022 and had resumed amiodarone 200 mg daily.  She reported being tired but also has pain issues that affect her sleep.  Treatment options discussed including continuing amiodarone for rate control, consider Tikosyn, although with current amiodarone failure it is unlikely to help, discussed AV nodal ablation with EP. She wanted to consider these options.         History of Present Illness: [name]   Tammy Walker is a very pleasant 89 y.o. female who is here today for follow-up, and is accompanied by her daughter. She reports she gets tired walking a short distance. Feels improvement immediately after cardioversion when sinus rhythm is restored, but she usually quickly converts back to a fib/flutter. Has learned to adjust to taking things slowly so she does not give out of breath.  She walks with a walker and uses a wheelchair for long distance. Has chronic back pain and scoliosis which has caused kyphosis.  Has chronic midsternal chest discomfort for which she uses hot packs. She denies chest pain, dyspnea, orthopnea, PND. Has mild chronic LE edema that is stable. She was hoping to discuss AV node ablation and pacemaker implant with Dr. Anne Fu.   ROS: See HPI       Studies Reviewed: [name]   EKG Interpretation Date/Time:  Monday February 09 2023 11:31:38 EDT Ventricular Rate:  78 PR Interval:    QRS Duration:  146 QT Interval:  438 QTC Calculation: 499 R Axis:  127  Text Interpretation: Atrial flutter with variable A-V block Right axis deviation Non-specific intra-ventricular conduction block When compared with ECG of 17-Dec-2022 14:25, Minimal criteria for Anterior infarct are no longer Present Confirmed by Eligha Bridegroom [phone]) on 02/09/2023 11:35:27 AM     Risk Assessment/Calculations:    CHA2DS2-VASc Score = 5   This  indicates a 7.2% annual risk of stroke. The patient's score is based upon: CHF History: 1 HTN History: 1 Diabetes History: 0 Stroke History: 0 Vascular Disease History: 0 Age Score: 2 Gender Score: 1            Physical Exam:   VS:  BP 126/88   Pulse 78   Ht 5' (1.524 m)   Wt 114 lb 12.8 oz (52.1 kg)   SpO2 96%   BMI 22.42 kg/m    Wt Readings from Last 3 Encounters:  02/09/23 114 lb 12.8 oz (52.1 kg)  12/17/22 115 lb 3.2 oz (52.3 kg)  11/05/22 119 lb (54 kg)    GEN: Frail elderly female in no acute distress NECK: No JVD; No carotid bruits CARDIAC: Irregular RR, no murmurs, rubs, gallops RESPIRATORY:  Clear to auscultation without rales, wheezing or rhonchi  ABDOMEN: Soft, non-tender, non-distended EXTREMITIES:  No edema; No deformity     ASSESSMENT AND PLAN: .    Persistent atrial fibrillation/flutter: EKG reveals atrial flutter at 78 bpm.  HR is well-controlled.  No evidence of tachy palpitations. She continues to have fatigue with minimal activity. Has been followed by the A Fib clinic.  Amiodarone 200 mg daily continued. At last appointment with A Fib clinic, AV nodal ablation with PPM was discussed. Patient and daughter wanted to consider for a while and discuss with Dr. Anne Fu. Unfortunately, pt was unable to get an appointment with Dr. Anne Fu soon. She asks that I forward my note to Dr. Anne Fu and that he send a MyChart message to her. No bleeding concerns. Continue Savaysa for stroke prevention for CHA2DS2-VASc score of 5.   Valve disease: Echo 10/17/22 with moderate to severe MR, felt to likely be atrial functional MR with dilated LA, moderate TR, moderate calcification of AV with mild AI and no evidence of AS. Mildly reduced LV function. She gets fatigued with minimal exertion. Mild bilateral LE edema. No  indication of worsening valve function on exam today. We will continue to monitor clinically at this time.   Chronic combined CHF: Echo 10/17/22 with LVEF 45-50%,  indeterminate diastolic parameters.  No evidence of volume overload on exam. Has chronic intermittent mild LE edema for which she takes Lasix. She has fatigue with mild exertion, but no dyspnea, orthopnea, PND.   Hypertension: BP is well controlled. No medication changes today.   Left bundle branch block: LV with septal hypokinesis and septal-lateral dyssychrony consistent with LBBB on echo 10/17/22 with mildly reduced LVEF 45-50%. She is asymptomatic. She is being referred to EP for consideration of AV node ablation and PPM implant.         Dispo: 5 months with Dr. Anne Fu  Signed, Eligha Bridegroom, NP-C

## 2023-02-09 ENCOUNTER — Ambulatory Visit: Payer: Medicare Other | Admitting: Nurse Practitioner

## 2023-02-09 ENCOUNTER — Encounter: Payer: Self-pay | Admitting: Nurse Practitioner

## 2023-02-09 VITALS — BP 126/88 | HR 78 | Ht 60.0 in | Wt 114.8 lb

## 2023-02-09 DIAGNOSIS — I4892 Unspecified atrial flutter: Secondary | ICD-10-CM | POA: Diagnosis present

## 2023-02-09 DIAGNOSIS — I447 Left bundle-branch block, unspecified: Secondary | ICD-10-CM | POA: Insufficient documentation

## 2023-02-09 DIAGNOSIS — D6869 Other thrombophilia: Secondary | ICD-10-CM | POA: Diagnosis present

## 2023-02-09 DIAGNOSIS — I4819 Other persistent atrial fibrillation: Secondary | ICD-10-CM | POA: Insufficient documentation

## 2023-02-09 NOTE — Patient Instructions (Signed)
Medication Instructions:   Your physician recommends that you continue on your current medications as directed. Please refer to the Current Medication list given to you today.   *If you need a refill on your cardiac medications before your next appointment, please call your pharmacy*   Lab Work:  None ordered.  If you have labs (blood work) drawn today and your tests are completely normal, you will receive your results only by: MyChart Message (if you have MyChart) OR A paper copy in the mail If you have any lab test that is abnormal or we need to change your treatment, we will call you to review the results.   Testing/Procedures:  None ordered.   Follow-Up: At [facility], you and your health needs are our priority.  As part of our continuing mission to provide you with exceptional heart care, we have created designated Provider Care Teams.  These Care Teams include your primary Cardiologist (physician) and Advanced Practice Providers (APPs -  Physician Assistants and Nurse Practitioners) who all work together to provide you with the care you need, when you need it.  We recommend signing up for the patient portal called "MyChart".  Sign up information is provided on this After Visit Summary.  MyChart is used to connect with patients for Virtual Visits (Telemedicine).  Patients are able to view lab/test results, encounter notes, upcoming appointments, etc.  Non-urgent messages can be sent to your provider as well.   To learn more about what you can do with MyChart, go to [email or web address].    Your next appointment:   5 month(s)  Provider:   Donato Schultz, MD     Other Instructions  You have been referred to EP to discuss possible pacemaker.  The office will call you to schedule a consult.

## 2023-02-12 ENCOUNTER — Telehealth: Payer: Self-pay | Admitting: Nurse Practitioner

## 2023-02-12 NOTE — Telephone Encounter (Signed)
Darel Hong, the daughter of the patient, would like to inquire about whether Dr. Anne Fu has provided his recommendations regarding the potential PPM for the patient. She mentioned there is no rush, and kindly asks to be notified once Dr. Anne Fu' feedback is available

## 2023-02-13 NOTE — Telephone Encounter (Signed)
[name], MD  You; Virl Axe, Rinaldo Cloud, RN1 hour ago (6:52 AM)    I will have EP help decide

## 2023-02-23 ENCOUNTER — Other Ambulatory Visit: Payer: Self-pay | Admitting: *Deleted

## 2023-02-23 DIAGNOSIS — I4819 Other persistent atrial fibrillation: Secondary | ICD-10-CM

## 2023-02-23 MED ORDER — EDOXABAN TOSYLATE 30 MG PO TABS
30.0000 mg | ORAL_TABLET | ORAL | 1 refills | Status: DC
Start: 2023-02-23 — End: 2023-08-03

## 2023-02-23 NOTE — Telephone Encounter (Signed)
Prescription refill request for [facility] received. Indication: AF Last office visit: 02/09/23  Benjamine Sprague NP Scr: 0.73 on 11/21/22  Epic Age: 89 Weight: 52.1 Scr: 42.13  Based on above findings Savaysa 30mg  daily is the appropriate dose.  Refill approved.

## 2023-02-25 NOTE — Telephone Encounter (Signed)
Left message for Tammy Walker that Dr Anne Fu stated he will have EP decide on needs regarding potential PPM.  Advised to keep upcoming appt with Dr Elberta Fortis as scheduled.  Requested she call back if any further questions or concerns.

## 2023-03-06 ENCOUNTER — Telehealth: Payer: Self-pay | Admitting: Cardiology

## 2023-03-06 NOTE — Telephone Encounter (Signed)
STAT if HR is under 50 or over 120 (normal HR is 60-100 beats per minute)  What is your heart rate?   Do you have a log of your heart rate readings (document readings)?   Do you have any other symptoms?  No, patient's daughter states patient is asymptomatic and feels fine. Patient's daughter states for the past 12 hours patient's HR has been elevated ranging about 100-115 at rest. He states the highest it has gotten to was 120 one time earlier today.

## 2023-03-06 NOTE — Telephone Encounter (Signed)
[name], MD  to Me  Sharin Grave, RN    03/06/23  3:42 PM  Agree with plan.  In fact over the next week she can take amiodarone 200 mg twice a day instead of once a day.  This will be utilized in case she is having any evidence of atrial flutter currently.  Called patient's daughter back and informed her of Dr. Anne Fu advisement. Patient has enough amiodarone right now, but if she needs to continue at this dose after her appointment next week, she will need medication updated and refills sent to her pharmacy.

## 2023-03-06 NOTE — Telephone Encounter (Signed)
Called patient's daughter about message. Patient has elevated HR since last night. Patient's BP is also elevated at 136/97. Patient did take her amiodarone today. Patient does not have any symptoms. Patient is agreeable to watch it over the weekend and if it does not improve come in and see Dr. Anne Fu on Thursday. If her HR and BP improve over the weekend they will call and possibly cancel appointment.

## 2023-03-10 IMAGING — DX DG CHEST 1V PORT
1 series · 1 of 1 positions shown · non-contrast
Comparison: None.

CLINICAL DATA: Chest pain

EXAM:
PORTABLE CHEST 1 VIEW

[chest]
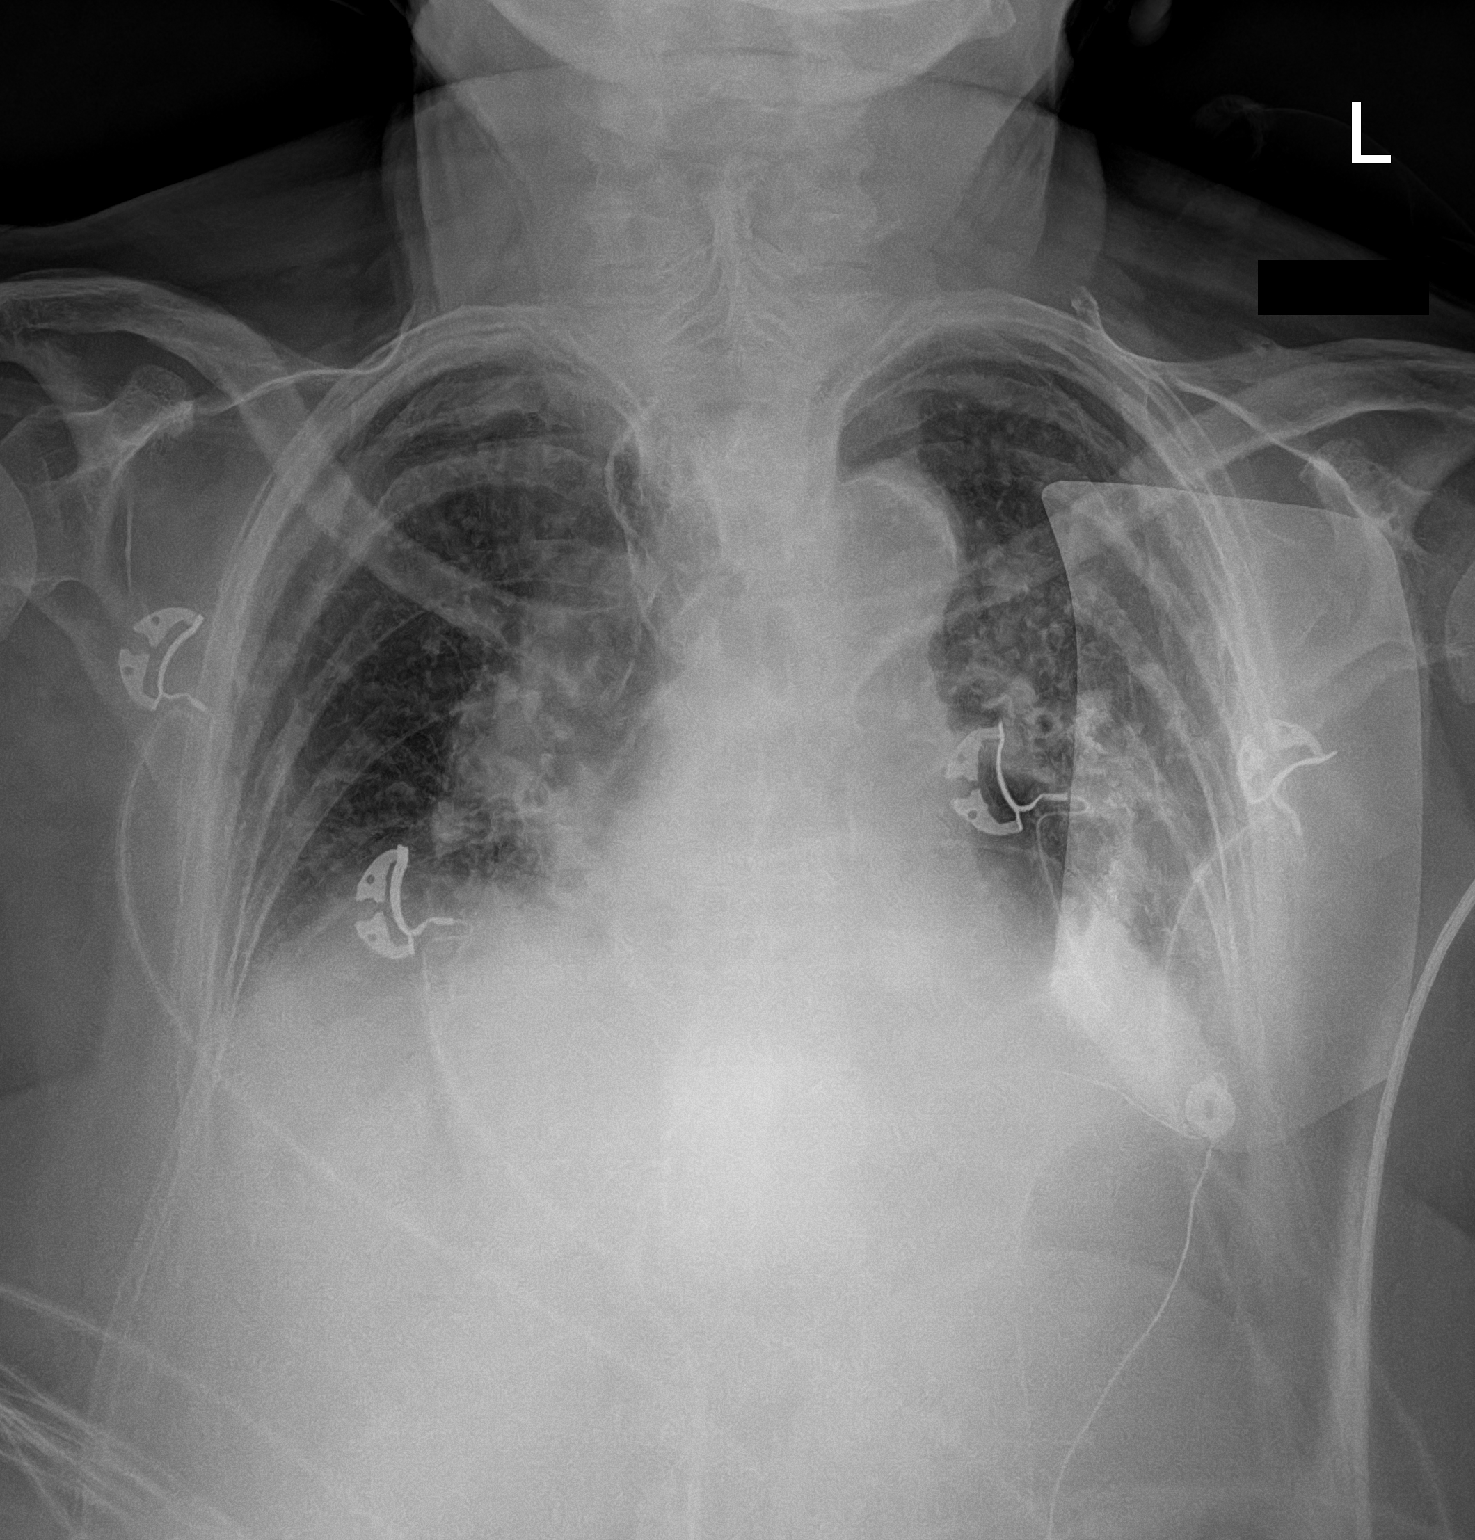

[1 of 1 positions shown; findings below may reference images not displayed]

FINDINGS: Cardiomegaly. Small, layering bilateral pleural effusions. The
visualized skeletal structures are unremarkable.
IMPRESSION: Cardiomegaly with small, layering bilateral pleural effusions.

## 2023-03-11 IMAGING — DX DG CHEST 1V PORT
1 series · 1 of 1 positions shown · non-contrast
Comparison: Chest radiograph from one day prior.

CLINICAL DATA: CHF

EXAM:
PORTABLE CHEST 1 VIEW

[chest]
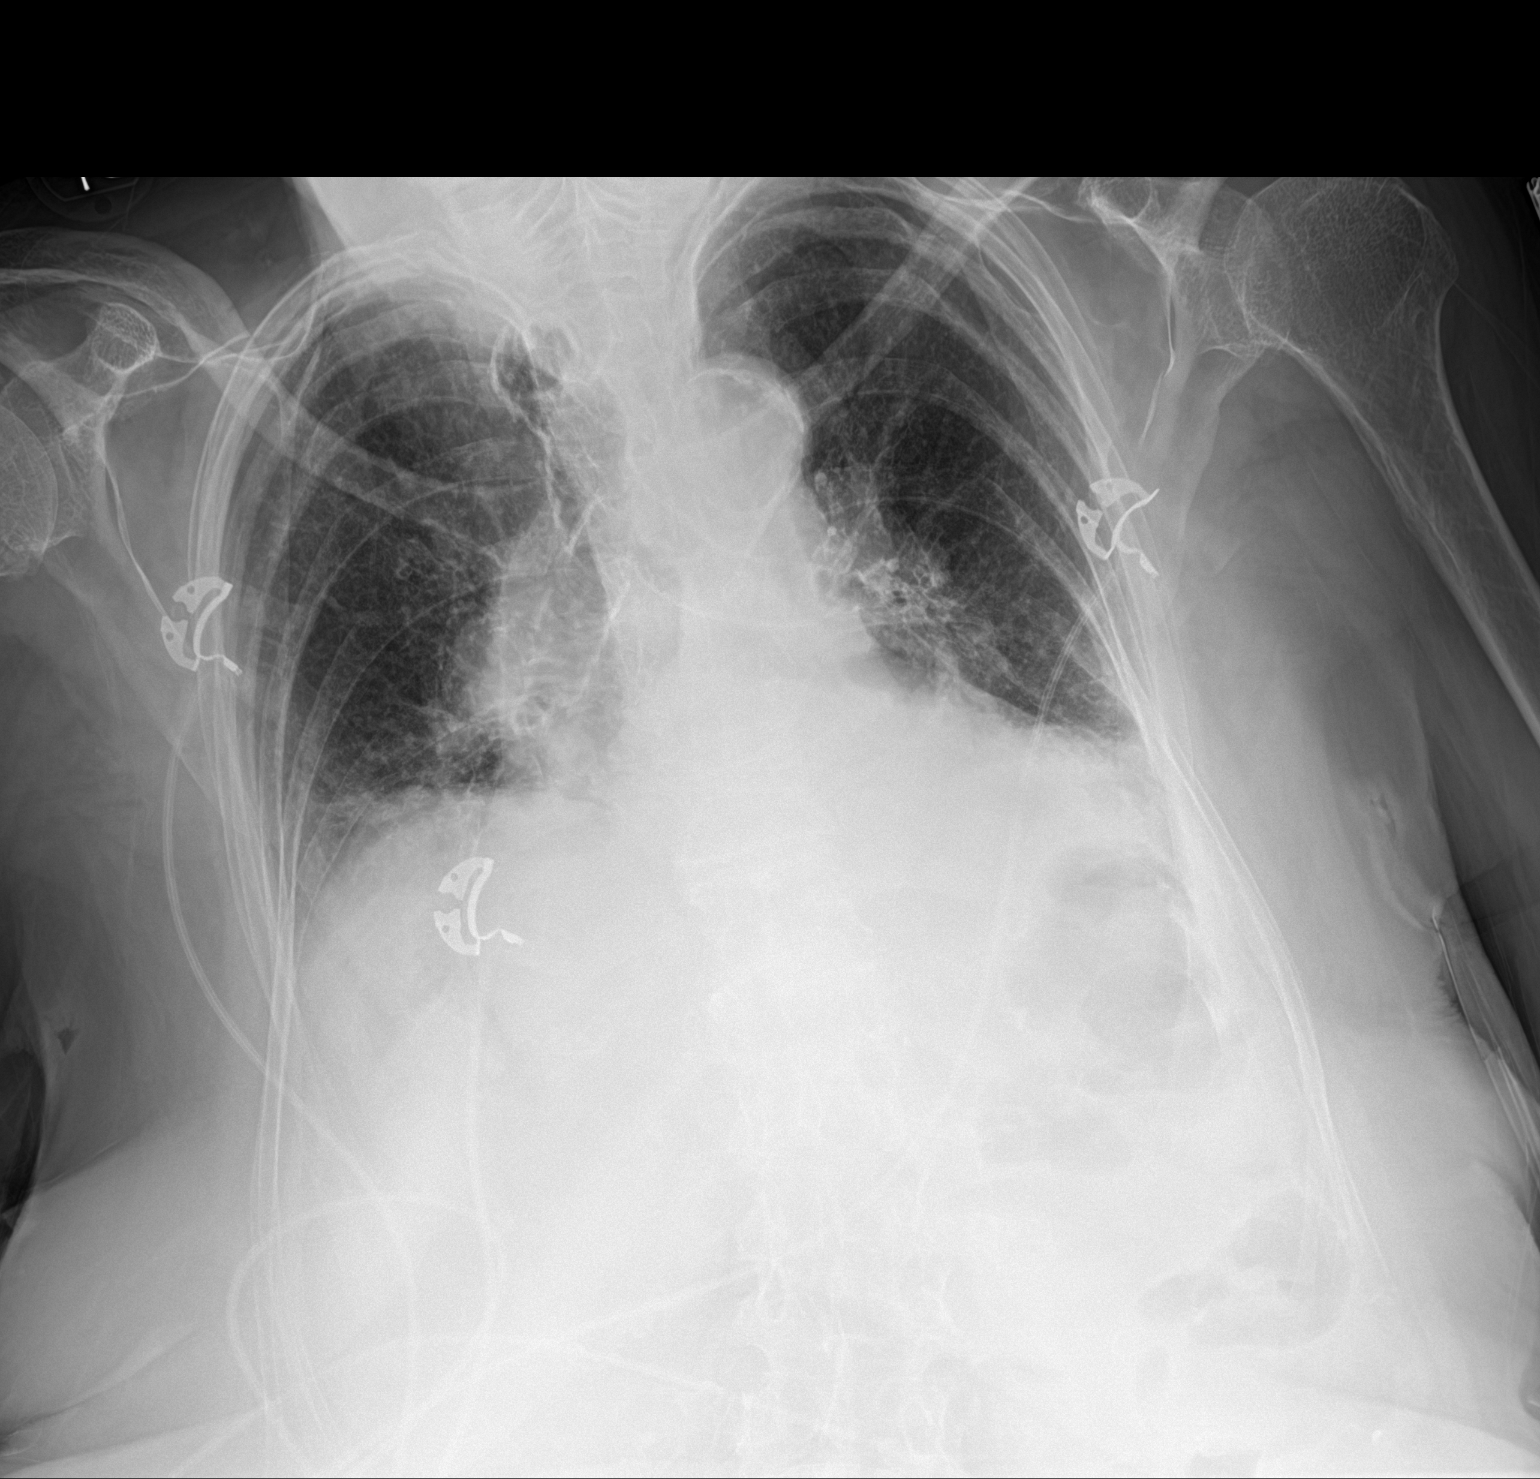

[1 of 1 positions shown; findings below may reference images not displayed]

FINDINGS: Low lung volumes. Stable cardiomediastinal silhouette with mild
cardiomegaly. No pneumothorax. No pleural effusion. No overt
pulmonary edema. Patchy bibasilar lung opacities, similar.
IMPRESSION: 1. Stable mild cardiomegaly without overt pulmonary edema.
2. Stable low lung volumes with patchy bibasilar lung opacities,
favor atelectasis.

## 2023-03-12 ENCOUNTER — Encounter: Payer: Self-pay | Admitting: Cardiology

## 2023-03-12 ENCOUNTER — Ambulatory Visit: Payer: Medicare Other | Attending: Cardiology | Admitting: Cardiology

## 2023-03-12 VITALS — BP 110/72 | HR 97 | Ht 60.0 in | Wt 111.6 lb

## 2023-03-12 DIAGNOSIS — I350 Nonrheumatic aortic (valve) stenosis: Secondary | ICD-10-CM | POA: Diagnosis not present

## 2023-03-12 DIAGNOSIS — I4891 Unspecified atrial fibrillation: Secondary | ICD-10-CM | POA: Insufficient documentation

## 2023-03-12 NOTE — Patient Instructions (Signed)
Medication Instructions:  The current medical regimen is effective;  continue present plan and medications.  *If you need a refill on your cardiac medications before your next appointment, please call your pharmacy*  Follow-Up: At [facility], you and your health needs are our priority.  As part of our continuing mission to provide you with exceptional heart care, we have created designated Provider Care Teams.  These Care Teams include your primary Cardiologist (physician) and Advanced Practice Providers (APPs -  Physician Assistants and Nurse Practitioners) who all work together to provide you with the care you need, when you need it.  We recommend signing up for the patient portal called "MyChart".  Sign up information is provided on this After Visit Summary.  MyChart is used to connect with patients for Virtual Visits (Telemedicine).  Patients are able to view lab/test results, encounter notes, upcoming appointments, etc.  Non-urgent messages can be sent to your provider as well.   To learn more about what you can do with MyChart, go to [email or web address].    Your next appointment:   Follow up as scheduled.

## 2023-03-12 NOTE — Progress Notes (Signed)
  Cardiology Office Note:  .   Date:  03/12/2023  ID:  Tammy Walker, DOB [birth year], MRN [id number] PCP: Wilfrid Lund, PA  [facility] Providers Cardiologist:  Donato Schultz, MD    History of Present Illness: Tammy Walker   Tammy Walker is a 89 y.o. female Discussed the use of AI scribe software for clinical note transcription with the patient, who gave verbal consent to proceed.  History of Present Illness   The patient, with a history of atrial fibrillation managed with amiodarone, presents for a follow-up visit. She reports an episode of increased heart rate, which occurred about a week ago. The episode lasted from the early morning hours until midday the following day, with heart rates ranging from 100 to 130 beats per minute. The patient managed this episode by taking an additional dose of amiodarone, as advised by the triage nurses. Since then, her heart rate has returned to normal ranges, and she has continued her regular dose of amiodarone. The patient denies any other symptoms or changes in her health.      Daughter present Astronomer (mailed me picture of granddaughter)  ROS: no falls, no CP  Studies Reviewed: Tammy Walker   EKG Interpretation Date/Time:  Thursday March 12 2023 15:34:21 EDT Ventricular Rate:  86 PR Interval:    QRS Duration:  146 QT Interval:  416 QTC Calculation: 497 R Axis:   141  Text Interpretation: Atrial flutter Non-specific intra-ventricular conduction block When compared with ECG of 09-Feb-2023 11:31, No significant change since last tracing Confirmed by Donato Schultz ([id number]) on 03/12/2023 3:41:57 PM     Risk Assessment/Calculations:            Physical Exam:   VS:  BP 110/72   Pulse 97   Ht 5' (1.524 m)   Wt 111 lb 9.6 oz (50.6 kg)   SpO2 97%   BMI 21.80 kg/m    Wt Readings from Last 3 Encounters:  03/12/23 111 lb 9.6 oz (50.6 kg)  02/09/23 114 lb 12.8 oz (52.1 kg)  12/17/22 115 lb 3.2 oz (52.3 kg)    GEN: Well nourished, well developed in  no acute distress NECK: No JVD; No carotid bruits CARDIAC: irreg, 1/6 SM, rubs, gallops RESPIRATORY:  Clear to auscultation without rales, wheezing or rhonchi  ABDOMEN: Soft, non-tender, non-distended EXTREMITIES:  No edema; No deformity   ASSESSMENT AND PLAN: .    Assessment and Plan    Atrial Fibrillation Recent episode of increased heart rate (100-130 bpm) managed with an additional dose of Amiodarone. Currently stable with heart rate in normal range. Discussed potential interventions including AV nodal ablation and pacemaker, but decided to continue current management due to patient preference and effective rate control. -Continue Amiodarone 200mg  daily. -Permit additional dose of Amiodarone as needed for episodes of increased heart rate. -Cancel appointment with electrophysiologist as invasive interventions are not currently being considered.  Follow-up appointment scheduled for January to reassess condition and management.            Dispo: Jan 2025  Signed, Donato Schultz, MD

## 2023-03-25 ENCOUNTER — Ambulatory Visit: Payer: Medicare Other | Admitting: Cardiology

## 2023-03-28 ENCOUNTER — Telehealth: Payer: Self-pay | Admitting: Cardiology

## 2023-03-28 NOTE — Telephone Encounter (Signed)
Outpatient service line: Chief complaint: Hypotension, fatigue  Her daughter Tammy Walker called stating that she had 1 slightly abnormal reading with blood pressure 96 systolic.  She felt a little bit fatigued and weak however has rechecked it and normalized between 100 120 systolic's with improvement in symptoms.  Discussed secondary causes of hypotension (dehydration and poor feeding) and need for routine monitoring and accuracy of the blood pressures.  No concerns right now.  She has been instructed to call back should she have any other concerns.

## 2023-04-13 ENCOUNTER — Ambulatory Visit (INDEPENDENT_AMBULATORY_CARE_PROVIDER_SITE_OTHER): Payer: Medicare Other | Admitting: Otolaryngology

## 2023-04-13 ENCOUNTER — Encounter (INDEPENDENT_AMBULATORY_CARE_PROVIDER_SITE_OTHER): Payer: Self-pay | Admitting: Otolaryngology

## 2023-04-13 VITALS — BP 144/81 | HR 101 | Ht 59.0 in | Wt 107.0 lb

## 2023-04-13 DIAGNOSIS — H6121 Impacted cerumen, right ear: Secondary | ICD-10-CM | POA: Diagnosis not present

## 2023-04-13 DIAGNOSIS — R0981 Nasal congestion: Secondary | ICD-10-CM | POA: Diagnosis not present

## 2023-04-13 DIAGNOSIS — J3089 Other allergic rhinitis: Secondary | ICD-10-CM | POA: Diagnosis not present

## 2023-04-13 DIAGNOSIS — H9193 Unspecified hearing loss, bilateral: Secondary | ICD-10-CM | POA: Diagnosis not present

## 2023-04-13 DIAGNOSIS — R0982 Postnasal drip: Secondary | ICD-10-CM | POA: Diagnosis not present

## 2023-04-13 DIAGNOSIS — J300 Vasomotor rhinitis: Secondary | ICD-10-CM

## 2023-04-13 MED ORDER — IPRATROPIUM BROMIDE 0.03 % NA SOLN: 2.0000 | Freq: Two times a day (BID) | NASAL | 12 refills | Status: DC

## 2023-04-13 NOTE — Patient Instructions (Signed)
-   continue to use Flonase and Zyrtec, and start Ipratropium Bromide  - schedule hearing test  - return after testing

## 2023-04-13 NOTE — Progress Notes (Unsigned)
ENT CONSULT:  Reason for Consult: hearing loss and nasal congestion.   HPI: Tammy Walker is an 89 y.o. female with hx long-standing over 20 yrs hx of hearing loss, here for hearing loss.  No tinnitus, no ear pain, no drainage. She has hearing aids. Had hearing test at [facility] and said she likely has age-related hearing loss. She uses ear drops for cerumen. She has a hard time hearing with her current hearing aids. She uses Flonase as needed. She has nasal congestion and takes Zyrtec in am every day.     Past Medical History:  Diagnosis Date   Allergy    Arrhythmia    Atrial fibrillation (HCC)    GERD (gastroesophageal reflux disease)    History of bone scan    Bone Density Scans 2017 & 2019 Dr. Dutch Quint    History of chest x-ray    Apart from large hiatus hernia, normal heart, lungs and mediastinum. Per records from [facility]     History of CT scan of abdomen 10/24/2017   Gallstones within a thin-waled gallbladder, Per [facility] from [facility]    History of echocardiogram    [facility] &  following   [facility]    History of EKG    Sinus rhythm, borderline 1st deg block, LAD completely changed axis, LBBB(old). Per records from [facility]    Hyperlipidemia    Hypertension    Insomnia    Left lower lobe pneumonia 09/20/2015   Per records from [facility]    Nodule of right lung    Per records from [facility],    Osteoporosis    on fosamax   Osteoporosis    Reduced mobility     Past Surgical History:  Procedure Laterality Date   APPENDECTOMY  [address], [place]    CARDIOVERSION N/A 10/29/2022   Procedure: CARDIOVERSION;  Surgeon: Thurmon Fair, MD;  Location: [facility] INVASIVE CV LAB;  Service: Cardiovascular;  Laterality: N/A;   CARDIOVERSION N/A 12/01/2022   Procedure: CARDIOVERSION;  Surgeon: Christell Constant, MD;  Location: [facility] INVASIVE CV LAB;  Service: Cardiovascular;   Laterality: N/A;   CT SCAN     2017, 2018, 2019 -- re lungs see records    TUBAL LIGATION  1978   [facility] [place], [place]     Family History  Problem Relation Age of Onset   Heart disease Mother    High blood pressure Mother    COPD Mother    Heart failure Mother    Heart disease Father    Heart disease Brother    Heart failure Brother    Hyperlipidemia Daughter    Hypertension Daughter     Social History:  reports that she has never smoked. She has never used smokeless tobacco. She reports that she does not currently use alcohol. She reports that she does not use drugs.  Allergies: No Known Allergies  Medications: I have reviewed the patient's current medications.  The PMH, PSH, Medications, Allergies, and SH were reviewed and updated.  ROS: Constitutional: Negative for fever, weight loss and weight gain. Cardiovascular: Negative for chest pain and dyspnea on exertion. Respiratory: Is not experiencing shortness of breath at rest. Gastrointestinal: Negative for nausea and vomiting. Neurological: Negative for headaches. Psychiatric: The patient is not nervous/anxious  There were no vitals taken for this visit.  PHYSICAL EXAM:  Exam: General: Well-developed, well-nourished Respiratory Respiratory effort: Equal inspiration and expiration without stridor Cardiovascular Peripheral Vascular: Warm extremities  with equal color/perfusion Eyes: No nystagmus with equal extraocular motion bilaterally Neuro/Psych/Balance: Patient oriented to person, place, and time; Appropriate mood and affect; Gait is intact with no imbalance; Cranial nerves I-XII are intact Head and Face Inspection: Normocephalic and atraumatic without mass or lesion Palpation: Facial skeleton intact without bony stepoffs Salivary Glands: No mass or tenderness Facial Strength: Facial motility symmetric and full bilaterally ENT Pinna: External ear intact and fully developed External canal: Canal is patent  with intact skin Tympanic Membrane: Clear and mobile External Nose: No scar or anatomic deformity Internal Nose: Septum intact and midline. No edema, polyp, or rhinorrhea Lips, Teeth, and gums: Mucosa and teeth intact and viable TMJ: No pain to palpation with full mobility Oral cavity/oropharynx: No erythema or exudate, no lesions present Neck Neck and Trachea: Midline trachea without mass or lesion Thyroid: No mass or nodularity Lymphatics: No lymphadenopathy  Procedure: Procedure: Cerumen Removal, Bilateral (CPT [id number])  Diagnosis: cerumen impaction, bilateral   Informed consent: Timeout performed and informed consent was obtained.  Procedure: Operating microscope was employed to evaluate the ear(s).  Cerumen curette, speculum and suction were employed to clear the cerumen.   Findings: Normal appearing tympanic membrane on the left without perforations, and external canals are normal after removal of cerumen.No middle ear fluid bilaterally.   Complications: None. Patient tolerated well.       Studies Reviewed:***  Assessment/Plan: No diagnosis found.  ***  Thank you for allowing me to participate in the care of this patient. Please do not hesitate to contact me with any questions or concerns.   Ashok Croon, MD Otolaryngology [facility] Phone: [phone] Fax: [phone]    04/13/2023, 2:00 PM

## 2023-05-04 ENCOUNTER — Ambulatory Visit (INDEPENDENT_AMBULATORY_CARE_PROVIDER_SITE_OTHER): Payer: Medicare Other | Admitting: Audiology

## 2023-05-04 DIAGNOSIS — H903 Sensorineural hearing loss, bilateral: Secondary | ICD-10-CM

## 2023-05-04 NOTE — Progress Notes (Addendum)
  [address], [address] [place], [place] [address] [phone]  Audiological Evaluation    Name: Tammy Walker     DOB:   February 15, 1931      MRN:   [id number]                                                                                     Service Date: 05/04/2023     Accompanied by: daughter   Patient comes today after Dr. Irene Pap, ENT sent a referral for a hearing evaluation due to concerns with hearing loss.   Symptoms Yes Details  Hearing loss  [x]  Both ears, no previous audiogram on file  Tinnitus  []    Ear pain/ Ear infections  []    Balance problems  []    Noise exposure  []    Previous ear surgeries  []    Family history  [x]  Brother - with age  Amplification  [x]  Currently is wearing her second set of hearing aids. They a receiver in the canal hearing aids and she purchased them around 2020 or 2021 at [facility]. Reports she struggles to hear with them.  Other  []      Otoscopy: Right ear: Clear external ear canals and notable landmarks visualized on the tympanic membrane. Left ear:  Clear external ear canals and notable landmarks visualized on the tympanic membrane.  Tympanometry: Right ear: Type A- Normal external ear canal volume with normal middle ear pressure and tympanic membrane compliance Left ear: Type A- Normal external ear canal volume with normal middle ear pressure and tympanic membrane compliance  Pure tone Audiometry: Right ear- Moderate to severe essentially sensorineural hearing loss from 125 Hz - 8000 Hz. There was a slight air-bone gap at 4000 Hz. Left ear-  Mild to profound sensorineural hearing loss from 125 Hz - 8000 Hz.  The hearing test results were completed under headphones and re-checked with inserts and results are deemed to be of good reliability. Test technique:  conventional     Speech Audiometry: Right ear- Speech Reception Threshold (SRT) was obtained at 60 dBHL Left ear-Speech Reception Threshold (SRT) was obtained at 65 dBHL    Word Recognition Score Tested using NU-6 (MLV) Right ear: 70% was obtained at a presentation level of 90 dBHL with contralateral masking which is deemed as  fair Left ear: 64% was obtained at a presentation level of 90 dBHL with contralateral masking which is deemed as  poor    Impression: There is a significant difference in pure-tone thresholds between ears at 8000 Hz only (left ear is worse). There is not a significant difference in the word recognition score in between ears.    Recommendations: Follow up with ENT as scheduled. Return for a hearing evaluation if concerns with hearing changes arise or per MD recommendation. Recommend a hearing aid check with their audiologist or hearing aid dispenser. Use communication strategies or additional hearing aid accessories to help improve her ability to understand what she hears. Consider a communication needs assessment after medical clearance for hearing aids is obtained.    Roxanne Panek MARIE LEROUX-MARTINEZ, AUD

## 2023-05-08 ENCOUNTER — Encounter: Payer: Self-pay | Admitting: Audiology

## 2023-05-21 ENCOUNTER — Ambulatory Visit (HOSPITAL_COMMUNITY)
Admission: RE | Admit: 2023-05-21 | Discharge: 2023-05-21 | Disposition: A | Discharge status: Home / Self Care | Payer: Medicare Other | Source: Ambulatory Visit | Attending: Internal Medicine | Admitting: Internal Medicine

## 2023-05-21 ENCOUNTER — Encounter (HOSPITAL_COMMUNITY): Payer: Self-pay | Admitting: Internal Medicine

## 2023-05-21 VITALS — BP 132/70 | HR 81 | Ht 59.0 in | Wt 111.6 lb

## 2023-05-21 DIAGNOSIS — Z79899 Other long term (current) drug therapy: Secondary | ICD-10-CM | POA: Diagnosis not present

## 2023-05-21 DIAGNOSIS — D6869 Other thrombophilia: Secondary | ICD-10-CM | POA: Diagnosis not present

## 2023-05-21 DIAGNOSIS — I4819 Other persistent atrial fibrillation: Secondary | ICD-10-CM | POA: Diagnosis not present

## 2023-05-21 DIAGNOSIS — I5032 Chronic diastolic (congestive) heart failure: Secondary | ICD-10-CM | POA: Insufficient documentation

## 2023-05-21 DIAGNOSIS — Z5181 Encounter for therapeutic drug level monitoring: Secondary | ICD-10-CM

## 2023-05-21 DIAGNOSIS — Z7901 Long term (current) use of anticoagulants: Secondary | ICD-10-CM | POA: Insufficient documentation

## 2023-05-21 DIAGNOSIS — Z9049 Acquired absence of other specified parts of digestive tract: Secondary | ICD-10-CM | POA: Diagnosis not present

## 2023-05-21 DIAGNOSIS — I130 Hypertensive heart and chronic kidney disease with heart failure and stage 1 through stage 4 chronic kidney disease, or unspecified chronic kidney disease: Secondary | ICD-10-CM | POA: Insufficient documentation

## 2023-05-21 DIAGNOSIS — I4821 Permanent atrial fibrillation: Secondary | ICD-10-CM | POA: Insufficient documentation

## 2023-05-21 DIAGNOSIS — N182 Chronic kidney disease, stage 2 (mild): Secondary | ICD-10-CM | POA: Insufficient documentation

## 2023-05-21 NOTE — Progress Notes (Signed)
Primary Care Physician: Wilfrid Lund, PA Primary Cardiologist: Dr. Anne Fu Primary Electrophysiologist: Dr. Ladona Ridgel Referring Physician:    [name] is a 89 y.o. female with a history of HTN, chronic HFpEF, 1st degree AV block, LBBB, aortic atherosclerosis, chronic back pain, CKD stage 2, and persistent atrial fibrillation who presents for consultation in the [facility].  The patient was initially diagnosed with atrial fibrillation in 2002. History of digoxin and metoprolol converted to NSR but later metoprolol discontinued due to dizziness and hypotension. History of amiodarone discontinued due to eye deposits seen by ophthalmology in 2021. She is currently on Multaq 400 mg BID which was started in 2023. She was recently admitted 4/17-19/24 due to chest pain and shortness of breath found to be in Afib with RVR. Discharged on lasix 20 mg daily and Multaq 400 mg BID. Patient is on edoxaban (Savaysa) 30 mg daily for a CHADS2VASC score of 5.  On evaluation 10/20/22, she is currently in Afib. She states she feels a little tired but otherwise no shortness of breath or chest pain. She can do her day to day activities but it takes her a little longer.   She is compliant with anticoagulation and has not missed any doses. She has no bleeding concerns.  On follow up 11/05/22, she is currently in rate controlled Afib. She is s/p DCCV on 10/29/22 with successful conversion to NSR x 1 shock. Overall, she has felt much better compared to before cardioversion. She notes to feel tired more so when in Afib but can do more of her daily activities since cardioversion. She has been compliant with Multaq and Savaysa. No chest pain or shortness of breath.  On follow up 12/17/22, she is currently in rate controlled atrial flutter. She is s/p successful DCCV on 12/01/22 currently taking amiodarone 200 mg daily. Daughter with patient here today notes the day after cardioversion she was back out  of rhythm. Patient admits she does not readily feel any different now compared to day of cardioversion. She is tired but also has pain issues which affects her sleep. Her back pain specifically makes her stop walking and sit down to rest and recover. She is compliant with amiodarone 200 mg daily and Savaysa.   On follow up 05/21/23, she is currently in NSR. Currently taking amiodarone 200 mg daily. Seen by primary cardiologist in September and agreed to proceed with amiodarone for rate control; no interventions planned. No bleeding issues on Savaysa.  Today, she denies symptoms of palpitations, chest pain, shortness of breath, orthopnea, PND, lower extremity edema, dizziness, presyncope, syncope, snoring, daytime somnolence, bleeding, or neurologic sequela. The patient is tolerating medications without difficulties and is otherwise without complaint today.   Atrial Fibrillation Risk Factors:  she does not have symptoms or diagnosis of sleep apnea. she does not have a history of rheumatic fever. she does not have a history of alcohol use. The patient does not have a history of early familial atrial fibrillation or other arrhythmias.  she has a BMI of Body mass index is 22.54 kg/m.[name] Filed Weights   05/21/23 1349  Weight: 50.6 kg     Family History  Problem Relation Age of Onset   Heart disease Mother    High blood pressure Mother    COPD Mother    Heart failure Mother    Heart disease Father    Heart disease Brother    Heart failure Brother    Hyperlipidemia Daughter  Hypertension Daughter     Atrial Fibrillation Management history:  Previous antiarrhythmic drugs: Amiodarone stopped due to eye deposits previously. Previous cardioversions: 2002, 10/29/22 Previous ablations: None Anticoagulation history: Savaysa   Past Medical History:  Diagnosis Date   Allergy    Arrhythmia    Atrial fibrillation (HCC)    GERD (gastroesophageal reflux disease)    History of bone scan     Bone Density Scans 2017 & 2019 Dr. Dutch Quint    History of chest x-ray    Apart from large hiatus hernia, normal heart, lungs and mediastinum. Per records from [facility]     History of CT scan of abdomen 10/24/2017   Gallstones within a thin-waled gallbladder, Per records from [facility]    History of echocardiogram    [facility]    History of EKG    Sinus rhythm, borderline 1st deg block, LAD completely changed axis, LBBB(old). Per records from [facility]    Hyperlipidemia    Hypertension    Insomnia    Left lower lobe pneumonia 09/20/2015   Per records from [facility]    Nodule of right lung    Per records from [facility],    Osteoporosis    on fosamax   Osteoporosis    Reduced mobility    Past Surgical History:  Procedure Laterality Date   APPENDECTOMY  [address] [place]    CARDIOVERSION N/A 10/29/2022   Procedure: CARDIOVERSION;  Surgeon: Thurmon Fair, MD;  [facility];  Service: Cardiovascular;  Laterality: N/A;   CARDIOVERSION N/A 12/01/2022   Procedure: CARDIOVERSION;  Surgeon: Christell Constant, MD;  Location: [facility] CV LAB;  Service: Cardiovascular;  Laterality: N/A;   CT SCAN     2017, 2018, 2019 -- re lungs see records    TUBAL LIGATION  1978   [place], [place], [place]     Current Outpatient Medications  Medication Sig Dispense Refill   acetaminophen (TYLENOL) 500 MG tablet Take 500 mg by mouth See admin instructions. Take 500 mg 3 times daily may take an additional 500 mg dose as needed for pain     amiodarone (PACERONE) 200 MG tablet Take 1 tablet (200 mg total) by mouth daily. 90 tablet 2   atorvastatin (LIPITOR) 20 MG tablet TAKE 1 TABLET(20 MG) BY MOUTH AT BEDTIME 90 tablet 3   bisacodyl (DULCOLAX) 5 MG EC tablet Take 10 mg by mouth at bedtime.     Ca Phosphate-Cholecalciferol (CALCIUM WITH D3) 250-12.5 MG-MCG CHEW  Chew 2 tablets by mouth 2 (two) times daily.     calcium-vitamin D (OSCAL WITH D) 500-200 MG-UNIT tablet Take 1 tablet by mouth 2 (two) times daily. 60 tablet 3   carboxymethylcellulose (REFRESH TEARS) 0.5 % SOLN Place 1 drop into both eyes 3 (three) times daily as needed (dry eyes).     cetirizine (ZYRTEC) 10 MG tablet Take 10 mg by mouth every morning.     clotrimazole (LOTRIMIN) 1 % cream Apply 1 application. topically 2 (two) times daily as needed (fungus).     denosumab (PROLIA) 60 MG/ML SOSY injection Inject 60 mg into the skin every 6 (six) months.     diclofenac Sodium (VOLTAREN) 1 % GEL Apply 4 g topically 4 (four) times daily as needed (back pain).     edoxaban (SAVAYSA) 30 MG TABS tablet Take 1 tablet (30 mg total) by mouth daily. 90 tablet 1  fluticasone (FLONASE) 50 MCG/ACT nasal spray Place 2 sprays into both nostrils daily as needed for allergies.     furosemide (LASIX) 20 MG tablet Take 1 tablet (20 mg total) by mouth daily. 30 tablet 3   HYDROcodone Bitartrate ER 15 MG CP12 Take 15 mg by mouth 2 (two) times daily.     HYDROcodone-acetaminophen (NORCO) 10-325 MG tablet Take 1 tablet by mouth every 6 (six) hours as needed for moderate pain (pain score 4-6).     Hydrocortisone (PREPARATION H EX) Apply 1 Application topically daily as needed (hemorrhoids).     ipratropium (ATROVENT) 0.03 % nasal spray Place 2 sprays into both nostrils every 12 (twelve) hours. 30 mL 12   Multiple Vitamins-Minerals (PRESERVISION AREDS 2 PO) Take 1 capsule by mouth 2 (two) times daily.     ondansetron (ZOFRAN) 4 MG tablet Take 1 tablet (4 mg total) by mouth every 8 (eight) hours as needed for nausea or vomiting. 20 tablet 0   oxyCODONE (ROXICODONE) 5 MG/5ML solution Take 2.5 mLs by mouth 2 (two) times daily as needed for breakthrough pain.     Polyethyl Glycol-Propyl Glycol (SYSTANE) 0.4-0.3 % GEL ophthalmic gel Place 1 Application into both eyes at bedtime.     polyethylene glycol (MIRALAX / GLYCOLAX)  17 g packet Take 8.5 g by mouth 2 (two) times daily.     pregabalin (LYRICA) 25 MG capsule Take 25 mg by mouth See admin instructions. Taking 75 mg by mouth in the morning, 25 mg afternoon and 25 mg in the evening     pregabalin (LYRICA) 75 MG capsule Take 75 mg by mouth daily.     shark liver oil-cocoa butter (PREPARATION H) 0.25-3-85.5 % suppository Place 1 suppository rectally as needed for hemorrhoids.     No current facility-administered medications for this encounter.    No Known Allergies  ROS- All systems are reviewed and negative except as per the HPI above.  Physical Exam: Vitals:   05/21/23 1349  BP: 132/70  Pulse: 81  Weight: 50.6 kg  Height: 4\' 11"  (1.499 m)    GEN- The patient is well appearing, alert and oriented x 3 today.   Neck - no JVD or carotid bruit noted Lungs- Clear to ausculation bilaterally, normal work of breathing Heart- Regular rate and rhythm, no murmurs, rubs or gallops, PMI not laterally displaced Extremities- no clubbing, cyanosis, or edema Skin - no rash or ecchymosis noted   Wt Readings from Last 3 Encounters:  05/21/23 50.6 kg  04/13/23 48.5 kg  03/12/23 50.6 kg   EKG today demonstrates  Vent. rate 81 BPM PR interval 234 ms QRS duration 152 ms QT/QTcB 414/480 ms P-R-T axes 42 -4 173 Sinus rhythm with 1st degree A-V block Biatrial enlargement Left bundle branch block Abnormal ECG When compared with ECG of 12-Mar-2023 15:34, PREVIOUS ECG IS PRESENT  Echo 10/17/22 demonstrated:  1. Left ventricular ejection fraction, by estimation, is 45 to 50%. The  left ventricle has mildly decreased function. The left ventricle  demonstrates regional wall motion abnormalities with septal hypokinesis  and septal-lateral dyssynchrony consistent  with LBBB. There is moderate asymmetric left ventricular hypertrophy of  the basal-septal segment. Left ventricular diastolic parameters are  indeterminate.   2. Right ventricular systolic function is  normal. The right ventricular  size is normal. There is moderately elevated pulmonary artery systolic  pressure. The estimated right ventricular systolic pressure is 45.7 mmHg.   3. Left atrial size was severely dilated.   4. Right  atrial size was severely dilated.   5. The mitral valve is abnormal. 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 No evidence of  mitral stenosis.   6. Tricuspid valve regurgitation is moderate.   7. The aortic valve is tricuspid. There is moderate calcification of the  aortic valve. Aortic valve regurgitation is mild. Aortic valve  sclerosis/calcification is present, without any evidence of aortic  stenosis. Aortic valve mean gradient measures 6.2  mmHg.   8. Aortic dilatation noted. There is mild dilatation of the descending  aorta, measuring 39 mm.   9. The inferior vena cava is normal in size with <50% respiratory  variability, suggesting right atrial pressure of 8 mmHg.  10. The patient appeared to be in atrial fibrillation.   Epic records are reviewed at length today.  CHA2DS2-VASc Score = 5  The patient's score is based upon: CHF History: 1 HTN History: 1 Diabetes History: 0 Stroke History: 0 Vascular Disease History: 0 Age Score: 2 Gender Score: 1       ASSESSMENT AND PLAN: Permanent Atrial Fibrillation (ICD10:  I48.19) / Atrial flutter The patient's CHA2DS2-VASc score is 5, indicating a 7.2% annual risk of stroke.   S/p DCCV on 10/29/22.  S/p DCCV on 12/01/22 with successful conversion to NSR on amiodarone 200 mg daily.  She is in NSR. Continue amiodarone 200 mg daily for rate control.  Current guidelines for management of Afib indicate "amiodarone-related corneal microdeposits (epithelial keratopathy) are common, but visual abnormalities and light sensitivity are rare. Therefore, an ophthalmologic examination is reasonable only if visual abnormalities develop." Patient states when deposits were  noted she never had any visual symptoms.    2. Secondary Hypercoagulable State (ICD10:  D68.69) The patient is at significant risk for stroke/thromboembolism based upon her CHA2DS2-VASc Score of 5.  Continue Savaysa.  No missed doses.   3. HTN Stable today, no changes to current regimen.    F/u in 7 months for amiodarone surveillance.   Lake Bells, PA-C Afib Clinic [facility] [address] [place], [place] [address] [phone] 05/21/2023 2:36 PM

## 2023-05-22 ENCOUNTER — Encounter (INDEPENDENT_AMBULATORY_CARE_PROVIDER_SITE_OTHER): Payer: Self-pay | Admitting: Otolaryngology

## 2023-05-22 ENCOUNTER — Ambulatory Visit (INDEPENDENT_AMBULATORY_CARE_PROVIDER_SITE_OTHER): Payer: Medicare Other | Admitting: Otolaryngology

## 2023-05-22 VITALS — BP 151/88

## 2023-05-22 DIAGNOSIS — R0982 Postnasal drip: Secondary | ICD-10-CM | POA: Diagnosis not present

## 2023-05-22 DIAGNOSIS — H903 Sensorineural hearing loss, bilateral: Secondary | ICD-10-CM

## 2023-05-22 DIAGNOSIS — R0981 Nasal congestion: Secondary | ICD-10-CM | POA: Diagnosis not present

## 2023-05-22 DIAGNOSIS — J300 Vasomotor rhinitis: Secondary | ICD-10-CM

## 2023-05-22 DIAGNOSIS — J3089 Other allergic rhinitis: Secondary | ICD-10-CM

## 2023-05-22 NOTE — Progress Notes (Signed)
ENT Progress Note:  Update 05/22/23 Discussed the use of AI scribe software for clinical note transcription with the patient, who gave verbal consent to proceed.  History of Present Illness   The patient is a 89 yoF, with a history of moderate to severe sensorineural hearing loss on recent Audiogram, presents for a follow-up visit. She reports that her hearing aids were recently adjusted at [facility], which has resulted in some improvement in her hearing. However, she still struggles to hear people who are not directly in front of her or who are speaking from the side. She also reports difficulty hearing people who are behind her or wearing masks. Despite these challenges, she has noticed an improvement in her ability to hear during her church meetings.  In addition to her hearing loss, the patient has been managing nasal congestion with a prescription nasal spray. She initially used Flonase in the morning and the prescription spray in the evening, but after experiencing nosebleeds, she discontinued the Flonase. She has been using the prescription spray(Atrovent) once daily and would like to increase to twice daily. She reports that the nasal spray has been effective, as she is using fewer tissues.    Initial evaluation 04/13/23 Reason for Consult: hearing loss and nasal congestion.   HPI: Tammy Walker is an 89 y.o. female with hx long-standing over 20 yrs hx of hearing loss, here for hearing loss evaluation and chronic nasal congestion/rhinorrhea and post-nasal drainage.  No tinnitus, no ear pain, no ear drainage. She has hearing aids she purchased at [employer]. Had hearing test at [facility] and was told that she likely has age-related hearing loss. She uses ear drops for cerumen in her ears. She has a hard time hearing with her current hearing aids. She uses Flonase as needed for longstanding chronic nasal congestion rhinorrhea and postnasal drainage. She takes Zyrtec in am every day.  History of GERD  previously on medication but not currently record review indicates that she had evidence of hiatal hernia on chest x-ray 10/15/2022.    Past Medical History:  Diagnosis Date   Allergy    Arrhythmia    Atrial fibrillation (HCC)    GERD (gastroesophageal reflux disease)    History of bone scan    Bone Density Scans 2017 & 2019 Dr. Dutch Quint    History of chest x-ray    Apart from large hiatus hernia, normal heart, lungs and mediastinum. Per records from [facility]     History of CT scan of abdomen 10/24/2017   Gallstones within a thin-waled gallbladder, Per records from [facility]    History of echocardiogram    [facility]    History of EKG    Sinus rhythm, borderline 1st deg block, LAD completely changed axis, LBBB(old). Per records from [facility]    Hyperlipidemia    Hypertension    Insomnia    Left lower lobe pneumonia 09/20/2015   Per records from [facility]    Nodule of right lung    Per records from [facility],    Osteoporosis    on fosamax   Osteoporosis    Reduced mobility     Past Surgical History:  Procedure Laterality Date   APPENDECTOMY  [address], [place]    CARDIOVERSION N/A 10/29/2022   Procedure: CARDIOVERSION;  Surgeon: Thurmon Fair, MD;  [facility];  Service: Cardiovascular;  Laterality: N/A;   CARDIOVERSION N/A 12/01/2022  Procedure: CARDIOVERSION;  Surgeon: Christell Constant, MD;  Location: [facility];  Service: Cardiovascular;  Laterality: N/A;   CT SCAN     2017, 2018, 2019 -- re lungs see records    TUBAL LIGATION  1978   [facility], [place], [place]     Family History  Problem Relation Age of Onset   Heart disease Mother    High blood pressure Mother    COPD Mother    Heart failure Mother    Heart disease Father    Heart disease Brother    Heart failure Brother    Hyperlipidemia Daughter     Hypertension Daughter     Social History:  reports that she has never smoked. She has never used smokeless tobacco. She reports that she does not currently use alcohol. She reports that she does not use drugs.  Allergies: No Known Allergies  Medications: I have reviewed the patient's current medications.  The PMH, PSH, Medications, Allergies, and SH were reviewed and updated.  ROS: Constitutional: Negative for fever, weight loss and weight gain. Cardiovascular: Negative for chest pain and dyspnea on exertion. Respiratory: Is not experiencing shortness of breath at rest. Gastrointestinal: Negative for nausea and vomiting. Neurological: Negative for headaches. Psychiatric: The patient is not nervous/anxious  Blood pressure (!) 151/88, SpO2 91%.  PHYSICAL EXAM:  Exam: General: Well-developed, well-nourished Respiratory Respiratory effort: Equal inspiration and expiration without stridor Cardiovascular Peripheral Vascular: Warm extremities with equal color/perfusion Eyes: No nystagmus with equal extraocular motion bilaterally Neuro/Psych/Balance: Patient oriented to person, place, and time; Appropriate mood and affect; Gait is intact with no imbalance; Cranial nerves I-XII are intact Head and Face Inspection: Normocephalic and atraumatic without mass or lesion Palpation: Facial skeleton intact without bony stepoffs Salivary Glands: No mass or tenderness Facial Strength: Facial motility symmetric and full bilaterally ENT Pinna: External ear intact and fully developed External canal: Canals are patent with intact skin bilaterally  Tympanic Membrane: Clear and mobile External Nose: No scar or anatomic deformity Internal Nose: Septum intact and midline. No edema, polyp, or rhinorrhea Lips, Teeth, and gums: Mucosa and teeth intact and viable TMJ: No pain to palpation with full mobility Oral cavity/oropharynx: No erythema or exudate, no lesions present Neck Neck and Trachea:  Midline trachea without mass or lesion Thyroid: No mass or nodularity Lymphatics: No lymphadenopathy   Studies Reviewed:CXR 10/15/22 IMPRESSION: 1. No radiographic evidence of acute cardiopulmonary process. 2. Unchanged cardiomegaly and hiatal hernia.  Assessment/Plan: Encounter Diagnoses  Name Primary?   Sensorineural hearing loss (SNHL) of both ears Yes   Post-nasal drip    Environmental and seasonal allergies    Vasomotor rhinitis    Chronic nasal congestion      Longstanding sensorineural hearing loss bilaterally worse over time hearing aid dependent, significant gradual decrease in hearing function over time, no recent hearing evaluations -Referral to audiology for hearing test -We discussed that if she requires a new set of hearing aids based on test results she will be referred to a local hearing aid provider  2.  Cerumen impaction right side -removed today -Continue over-the-counter eardrops as needed   3.  Chronic nasal congestion rhinorrhea and postnasal drainage -suspect environmental allergies versus vasomotor rhinitis, could also be related to untreated GERD LPR -We discussed the importance of continuing topical therapy with Flonase 2 puffs bilateral nares daily -Will add ipratropium bromide at night 2 puffs bilateral nares daily -Continue Zyrtec 10 mg daily switch to taking Zyrtec at night to avoid drowsiness  Update 05/22/23 Assessment and Plan    B/l Sensorineural Hearing Loss Moderate to severe bilateral sensorineural hearing loss, age-related. Hearing aids adjusted to maximum amplification. Reports improvement but difficulty in specific situations (e.g., masks, group settings). Declined cochlear implants due to age and preference to avoid surgery. - Continue using hearing aids - Annual hearing test  Nasal Congestion and post-nasal drainage Nasal congestion managed with Atrovent nasal spray. Discontinued Flonase due to epistaxis. Prefers using Atrovent nasal  spray twice daily for symptom control. - Continue Atrovent nasal spray every 12 hours - Monitor for epistaxis  Follow-up - Schedule annual hearing test in one year.        Ashok Croon, MD Otolaryngology [facility] Phone: [phone] Fax: [phone]    05/22/2023, 4:43 PM

## 2023-07-13 ENCOUNTER — Ambulatory Visit: Payer: Medicare Other | Attending: Cardiology | Admitting: Cardiology

## 2023-07-13 VITALS — BP 138/88 | HR 83 | Ht 60.0 in | Wt 110.0 lb

## 2023-07-13 DIAGNOSIS — I447 Left bundle-branch block, unspecified: Secondary | ICD-10-CM

## 2023-07-13 DIAGNOSIS — I4819 Other persistent atrial fibrillation: Secondary | ICD-10-CM

## 2023-07-13 DIAGNOSIS — Z7901 Long term (current) use of anticoagulants: Secondary | ICD-10-CM

## 2023-07-13 NOTE — Patient Instructions (Signed)
 Medication Instructions:  The current medical regimen is effective;  continue present plan and medications.  *If you need a refill on your cardiac medications before your next appointment, please call your pharmacy*  Follow-Up: At [facility], you and your health needs are our priority.  As part of our continuing mission to provide you with exceptional heart care, we have created designated Provider Care Teams.  These Care Teams include your primary Cardiologist (physician) and Advanced Practice Providers (APPs -  Physician Assistants and Nurse Practitioners) who all work together to provide you with the care you need, when you need it.  We recommend signing up for the patient portal called "MyChart".  Sign up information is provided on this After Visit Summary.  MyChart is used to connect with patients for Virtual Visits (Telemedicine).  Patients are able to view lab/test results, encounter notes, upcoming appointments, etc.  Non-urgent messages can be sent to your provider as well.   To learn more about what you can do with MyChart, go to [email or web address].    Your next appointment:   1 year(s)  Provider:   Donato Schultz, MD

## 2023-07-13 NOTE — Progress Notes (Signed)
 Cardiology Office Note:  .   Date:  07/13/2023  ID:  AXEL MEAS, DOB July 09, 1930, MRN [id number] PCP: Alben Therisa MATSU, PA  [facility] Providers Cardiologist:  Oneil Parchment, MD     History of Present Illness: Tammy Walker is a 89 y.o. female Discussed with the use of AI scribe   History of Present Illness   The patient, a 89 year old with a history of persistent atrial fibrillation, chronic diastolic heart failure, first-degree AV block, left pulmonary branch block, aortic atherosclerosis, and chronic kidney disease stage two, presents for follow-up. She has been managed in the AFib clinic and is currently on amiodarone  200mg  daily. The patient underwent successful conversion of atrial flutter on 12/01/2022 and was found to be in normal sinus rhythm during a follow-up in November 2024. The patient reports no symptoms related to atrial fibrillation or flutter, and she is unaware of any episodes of heart racing or fluttering.  The patient's liver function has been excellent with an ALT of 8, and TSH is normal at 1.5. Hemoglobin is 12.3. An echocardiogram in April 2024 showed a mildly reduced ejection fraction of 45-50%, likely related to the left bundle branch block. There is moderate mitral valve leakage, which is being monitored, and the aorta appears normal.  The patient has been experiencing fluctuations in blood pressure, with readings as low as 99 and as high as 174. However, these fluctuations are asymptomatic. The patient also reports episodes of severe pain related to structural issues in the back and chest. Despite this, the patient remains active, engaging in activities such as cooking, albeit with frequent breaks due to pain.   Enjoys archivist.          Studies Reviewed: .        Results   LABS ALT: 8 TSH: 1.5 Hemoglobin: 12.3  DIAGNOSTIC Echocardiogram: EF 45-50%, mild left bundle branch block, moderate mitral regurgitation (09/2022)     Risk  Assessment/Calculations:            Physical Exam:   VS:  BP 138/88 (BP Location: Right Arm)   Pulse 83   Ht 5' (1.524 m)   Wt 110 lb (49.9 kg)   SpO2 95%   BMI 21.48 kg/m    Wt Readings from Last 3 Encounters:  07/13/23 110 lb (49.9 kg)  05/21/23 111 lb 9.6 oz (50.6 kg)  04/13/23 107 lb (48.5 kg)    GEN: Well nourished, well developed in no acute distress NECK: No JVD; No carotid bruits, kyphosis CARDIAC: RRR, soft systolic murmur, no rubs, no gallops RESPIRATORY:  Clear to auscultation without rales, wheezing or rhonchi  ABDOMEN: Soft, non-tender, non-distended EXTREMITIES:  No edema; No deformity   ASSESSMENT AND PLAN: .    Assessment and Plan    Persistent Atrial Fibrillation with Chronic Diastolic Heart Failure [age]-year-old with persistent atrial fibrillation and chronic diastolic heart failure. Currently in normal sinus rhythm on amiodarone  200 mg daily. Last successful cardioversion on 12/01/2022. No reported episodes of atrial fibrillation or flutter. Echocardiogram in April 2024 showed mildly reduced ejection fraction (45-50%) and moderate mitral valve regurgitation. Amiodarone  has been effective in maintaining normal sinus rhythm. Patient prefers to continue current medication regimen. - Continue amiodarone  200 mg daily - Monitor for symptoms of atrial fibrillation or flutter-none currently - Follow-up in AFib clinic in July - Schedule follow-up with me in one year  First Degree AV Block and Left Bundle Branch Block First degree AV block and left  bundle branch block noted. No acute symptoms reported. Echocardiogram showed mildly reduced ejection fraction. No immediate intervention required. - Continue monitoring  Aortic Atherosclerosis Aortic atherosclerosis noted. No acute symptoms reported. Aorta appears stable on echocardiogram. No immediate intervention required. - Continue monitoring  Hypertension Episodes of hypertension reported, with readings  fluctuating between 99 and 174 mmHg. Asymptomatic during these episodes. Advised no immediate action unless symptoms develop or high readings persist for a week. Discussed that situational factors such as anxiety and pain can influence blood pressure readings. - Monitor blood pressure - Report if high readings persist for a week or if symptoms develop  Chronic Kidney Disease Stage 2 Chronic kidney disease stage 2. No acute symptoms reported. ALT, TSH, and hemoglobin levels are within normal limits. No immediate intervention required. - Continue monitoring kidney function  Chronic Pain Reports of chronic pain, particularly in the back and chest. Pain management discussed, with emphasis on balancing pain control and heart health. Advised to rest during episodes of severe pain and to manage pain as needed. - Manage pain as needed - Encourage rest during episodes of severe pain  Follow-up - Schedule follow-up with me in one year - Ensure AFib clinic notes are communicated to cardiologist.              Signed, Oneil Parchment, MD

## 2023-08-03 ENCOUNTER — Other Ambulatory Visit: Payer: Self-pay | Admitting: *Deleted

## 2023-08-03 ENCOUNTER — Telehealth: Payer: Self-pay | Admitting: Cardiology

## 2023-08-03 DIAGNOSIS — I4819 Other persistent atrial fibrillation: Secondary | ICD-10-CM

## 2023-08-03 MED ORDER — EDOXABAN TOSYLATE 30 MG PO TABS: 30.0000 mg | ORAL_TABLET | ORAL | 3 refills | Status: AC

## 2023-08-03 MED ORDER — AMIODARONE HCL 200 MG PO TABS: 200.0000 mg | ORAL_TABLET | Freq: Every day | ORAL | 3 refills | Status: DC

## 2023-08-03 NOTE — Telephone Encounter (Signed)
*  STAT* If patient is at the pharmacy, call can be transferred to refill team.   1. Which medications need to be refilled? (please list name of each medication and dose if known) Amiodarone and Savaysa   2. Would you like to learn more about the convenience, safety, & potential cost savings by using the [facility]?     3. Are you open to using the [facility] (Type [facility].   4. Which pharmacy/location (including street and city if local pharmacy) is medication to be sent to?[facility] [address] [place]  5. Do they need a 30 day or 90 day supply? 90 days and refills

## 2023-09-09 ENCOUNTER — Other Ambulatory Visit (HOSPITAL_COMMUNITY): Payer: Self-pay

## 2023-09-09 MED ORDER — HYDROCODONE BITARTRATE ER 15 MG PO CP12
15.0000 mg | ORAL_CAPSULE | Freq: Two times a day (BID) | ORAL | 0 refills | Status: DC
  Filled 2023-10-02 (×2): qty 30, 15d supply, fill #0

## 2023-09-10 ENCOUNTER — Other Ambulatory Visit (HOSPITAL_COMMUNITY): Payer: Self-pay

## 2023-09-28 ENCOUNTER — Other Ambulatory Visit: Payer: Self-pay | Admitting: Cardiology

## 2023-09-30 ENCOUNTER — Other Ambulatory Visit (HOSPITAL_COMMUNITY): Payer: Self-pay

## 2023-10-02 ENCOUNTER — Other Ambulatory Visit (HOSPITAL_COMMUNITY): Payer: Self-pay

## 2023-10-02 ENCOUNTER — Other Ambulatory Visit: Payer: Self-pay

## 2023-10-12 ENCOUNTER — Other Ambulatory Visit (HOSPITAL_COMMUNITY): Payer: Self-pay

## 2023-11-10 ENCOUNTER — Encounter: Payer: Self-pay | Admitting: Physical Medicine & Rehabilitation

## 2023-11-26 ENCOUNTER — Encounter: Payer: Self-pay | Admitting: Physical Medicine & Rehabilitation

## 2023-11-26 ENCOUNTER — Encounter: Attending: Physical Medicine & Rehabilitation | Admitting: Physical Medicine & Rehabilitation

## 2023-11-26 VITALS — BP 115/63 | HR 83 | Ht 60.0 in

## 2023-11-26 DIAGNOSIS — S22000D Wedge compression fracture of unspecified thoracic vertebra, subsequent encounter for fracture with routine healing: Secondary | ICD-10-CM | POA: Diagnosis present

## 2023-11-26 DIAGNOSIS — M40209 Unspecified kyphosis, site unspecified: Secondary | ICD-10-CM | POA: Diagnosis present

## 2023-11-26 DIAGNOSIS — M546 Pain in thoracic spine: Secondary | ICD-10-CM | POA: Insufficient documentation

## 2023-11-26 DIAGNOSIS — G8929 Other chronic pain: Secondary | ICD-10-CM | POA: Insufficient documentation

## 2023-11-26 DIAGNOSIS — Z5181 Encounter for therapeutic drug level monitoring: Secondary | ICD-10-CM | POA: Diagnosis present

## 2023-11-26 DIAGNOSIS — Z79891 Long term (current) use of opiate analgesic: Secondary | ICD-10-CM | POA: Diagnosis present

## 2023-11-26 DIAGNOSIS — G894 Chronic pain syndrome: Secondary | ICD-10-CM | POA: Diagnosis not present

## 2023-11-26 NOTE — Progress Notes (Addendum)
 Subjective:    Patient ID: Tammy Walker, female    DOB: 04-05-1931, 89 y.o.   MRN: [id number]  HPI  HPI  Tammy Walker is a 89 y.o. year old female  who  has a past medical history of Allergy, Arrhythmia, Atrial fibrillation (HCC), GERD (gastroesophageal reflux disease), History of bone scan, History of chest x-ray, History of CT scan of abdomen (10/24/2017), History of echocardiogram, History of EKG, Hyperlipidemia, Hypertension, Insomnia, Left lower lobe pneumonia (09/20/2015), Nodule of right lung, Osteoporosis, Osteoporosis, and Reduced mobility.   They are presenting to PM&R clinic as a new patient for pain management evaluation. They were referred by Therisa Holm, PA for treatment of chronic pain.  Thank you for the referral of this very pleasant patient.    Patient is here with her daughter.  Patient is a little hard of hearing.  She reports her back pain began around 2014 when she was getting ready for a trip to [place]. Reviewed x-ray results from T-spine x-ray 07/08/2012 which showed compression fractures T9 and L2 and thoracic kyphosis.  Patient reports she was in [place] and went to the hospital and was treated with morphine  that did not help but had different medication and was beneficial. Since this time she has had worsening pain in her thoracic spine and she also has pain in her sternum.  Pain is worsened with standing bending and reaching.  Pain wakes her up in the morning.  Ice and heat to help her pain.    Patient and daughter report she was originally taking the extended release 5 mg oxycodone  in [place] but this was not available in the [place] .  She was tried on 10 mg extended release hydrocodone  and then this was increased to 15 mg extended release hydrocodone .  She had difficulty getting this medication from the pharmacy and thus was started on Norco 10 3 times a day to achieve a similar dose.  She felt like she was always playing catch up with her pain on the IR hydrocodone  and  was changed over recently to oxycodone  ER 10 mg.  She feels like this medicine is doing about the same as the prior hydrocodone  ER.  She also takes Lyrica , hard to tell how much it is helping but she did notice a benefit when it was last increased.  She does have occasional issues with constipation, and is cautious to increase medication dose further.   Red flag symptoms: No red flags for back pain endorsed in Hx or ROS  Medications tried: Topical medications Voltaren  gel helps  Nsaids - blood thinners Limited Tylenol   - she takes three to four times a day with some benefit   Opiates  Hydrocodone  IR/hydrocodone  ER previously with benefit.  Hydrocodone  ER was hard to get from the pharmacy Now on oxycodone  ER  Lyrica  75mg  TID with suspected benefit TCAs  - Denies  SNRIs - Denies    Other treatments: PT- helped get her moving, unsure if it really helped with pain Chiropractor- didn't help TENs unit - Denies  Injections- denies Surgery denies  Other - Ice and heat help     Prior UDS results: No results found for: LABOPIA, COCAINSCRNUR, LABBENZ, AMPHETMU, THCU, LABBARB   Pain Inventory Average Pain 5 Pain Right Now 2 My pain is intermittent, sharp, dull, stabbing, aching, and Feel crushing  In the last 24 hours, has pain interfered with the following? General activity 3 Relation with others 0 Enjoyment of life 0 What TIME of  day is your pain at its worst? morning  and evening Sleep (in general) Good  Pain is worse with: bending, standing, some activites, and lifting Pain improves with: rest, heat/ice, medication, and lying down Relief from Meds: 5  walk without assistance walk with assistance use a walker ability to climb steps?  yes do you drive?  no use a wheelchair transfers alone Do you have any goals in this area?  yes  retired I need assistance with the following:  household duties and shopping Do you have any goals in this area?  yes  bladder  control problems weakness  Any changes since last visit?  no  Any changes since last visit?  yes    Family History  Problem Relation Age of Onset   Heart disease Mother    High blood pressure Mother    COPD Mother    Heart failure Mother    Heart disease Father    Heart disease Brother    Heart failure Brother    Hyperlipidemia Daughter    Hypertension Daughter    Social History   Socioeconomic History   Marital status: Widowed    Spouse name: Not on file   Number of children: Not on file   Years of education: Not on file   Highest education level: Not on file  Occupational History   Not on file  Tobacco Use   Smoking status: Never   Smokeless tobacco: Never   Tobacco comments:    Never smoked 05/21/23  Vaping Use   Vaping status: Never Used  Substance and Sexual Activity   Alcohol use: Not Currently    Comment: less than one drink a week   Drug use: No   Sexual activity: Never    Birth control/protection: None  Other Topics Concern   Not on file  Social History Narrative   Social History      Diet? I watch my intake of salt, sugar, and fat      Do you drink/eat things with caffeine? Yes       Marital status?      Widowed                               What year were you married? 1960      Do you live in a house, apartment, assisted living, condo, trailer, etc.? House       Is it one or more stories? one      How many persons live in your home? 2      Do you have any pets in your home? (please list) yes 2 cats       Highest level of education completed? 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             yes                         Type & how often? Strength & Balance       Advanced Directives      Do you have a living will?  No       Do you have a DNR form?        No  If not, do you want to discuss one? No       Do you have  signed POA/HPOA for forms? Yes       Functional Status      Do you have difficulty bathing or dressing yourself? No       Do you have difficulty preparing food or eating? No       Do you have difficulty managing your medications? No       Do you have difficulty managing your finances? No       Do you have difficulty affording your medications? No       Social Drivers of Corporate investment banker Strain: Not on file  Food Insecurity: Not on file  Transportation Needs: Not on file  Physical Activity: Not on file  Stress: Not on file  Social Connections: Not on file   Past Surgical History:  Procedure Laterality Date   APPENDECTOMY  1947   [facility], [place]    CARDIOVERSION N/A 10/29/2022   Procedure: CARDIOVERSION;  Surgeon: Francyne Headland, MD;  Location: [facility];  Service: Cardiovascular;  Laterality: N/A;   CARDIOVERSION N/A 12/01/2022   Procedure: CARDIOVERSION;  Surgeon: Santo Stanly LABOR, MD;  Location: [facility]VAS[facility] CV [facility];  Service: Cardiovascular;  Laterality: N/A;   CT SCAN     2017, 2018, 2019 -- re lungs see records    TUBAL LIGATION  1978   [facility], [place], [place]    Past Medical History:  Diagnosis Date   Allergy    Arrhythmia    Atrial fibrillation (HCC)    GERD (gastroesophageal reflux disease)    History of bone scan    Bone Density Scans 2017 & 2019 Dr. Malcolm    History of chest x-ray    Apart from large hiatus hernia, normal heart, lungs and mediastinum. Per records from [facility]     History of CT scan of abdomen 10/24/2017   Gallstones within a thin-waled gallbladder, Per records [facility]    History of echocardiogram    [facility] &  [facility]    History of EKG    Sinus rhythm, borderline 1st deg block, LAD completely changed axis, LBBB(old). Per [facility]    Hyperlipidemia    Hypertension    Insomnia    Left lower lobe pneumonia 09/20/2015   Per  records from [facility]    Nodule of right lung    Per [facility] from [facility],    Osteoporosis    on fosamax   Osteoporosis    Reduced mobility    Ht 5' (1.524 m)   BMI 21.48 kg/m   Opioid Risk Score:   Fall Risk Score:  `1  Depression screen [facility] 2/9     11/26/2023    2:39 PM 08/22/2019   10:45 AM 09/19/2016    3:22 PM 07/26/2015   10:29 AM  Depression screen PHQ 2/9  Decreased Interest 0 0 0 0  Down, Depressed, Hopeless 0 0 0 0  PHQ - 2 Score 0 0 0 0  Altered sleeping 0     Tired, decreased energy 2     Change in appetite 0     Feeling bad or failure about yourself  0     Trouble concentrating 0     Moving slowly or fidgety/restless 0     Suicidal thoughts 0     PHQ-9 Score 2  Review of Systems  Musculoskeletal:  Positive for back pain and neck pain.       Sternum pain  All other systems reviewed and are negative.      Objective:   Physical Exam  Gen: no distress, normal appearing HEENT: oral mucosa pink and moist, NCAT Chest: normal effort, normal rate of breathing Abd: soft, non-distended Ext: no edema Psych: Very pleasant Skin: intact Neuro: Alert and awake, follows commands, cranial nerves II through XII grossly intact although hard of hearing, normal speech and language RUE: 5/5 Deltoid, 5/5 Biceps, 5/5 Triceps, 5/5 Wrist Ext, 5/5 Grip LUE: 5/5 Deltoid, 5/5 Biceps, 5/5 Triceps, 5/5 Wrist Ext, 5/5 Grip RLE: HF 5/5, KE 5/5, ADF 5/5, APF 5/5 LLE: HF 5/5, KE 5/5, ADF 5/5, APF 5/5 Sensory exam normal for light touch and pain in all 4 limbs. No limb ataxia or cerebellar signs. No abnormal tone appreciated.  No abnormal tone noted Musculoskeletal:  Severe kyphosis and thoracic spine Very forward neck posture Patient does not have much tenderness in her paraspinal muscles T-spine and C-spine She does have some mild tenderness over spinous processes lower thoracic spine     Assessment & Plan:   Chronic  thoracic, sternal and neck pain likely related to thoracic kyphosis and compression fractures -TENS unit, Zynex Nexwave ordered - Consider trying shockwave therapy -T-spine x-ray ordered for further evaluation -Discussed trying cryotherapy device for hot cold therapy however she is not interested at this time - Patient was recently changed to oxycodone  ER-seems like this is doing okay for her at the moment and she does not want to increase medications.  Will continue on this medication OxyContin  10 mg every 12 hours for now.  Could consider as needed medication for breakthrough pain.   -As she just started OxyContin  and seems to be doing okay would hold of him changing to a different medication at this time -Patient is concerned about increased constipation if opioids are increased, this is a possibility although could add additional laxatives - I think she would benefit from trying a TENS unit, Nexwave ordered however she could also purchase device on her own.  Discussed risks and benefit of TENS unit. - Discussed foods that can be helpful for chronic pain - Drug screen today and pain agreement, will consider continuation of current medication pending results.  She reports she has pending a new refill of her medication from her current provider.  Discussed it is fine if she fills this as I have not started prescribing, she could let me know when her next refill after this is needed.  12/30/23 called to f/u on prior visit medication refills, xray results, no answer, left discrete VM

## 2023-11-27 ENCOUNTER — Encounter: Payer: Self-pay | Admitting: Physical Medicine & Rehabilitation

## 2023-11-27 ENCOUNTER — Telehealth: Payer: Self-pay | Admitting: Cardiology

## 2023-11-27 NOTE — Telephone Encounter (Signed)
  The patient's daughter, Marily Shows, reported that the patient recently saw a pain specialist due to back pain, neck pain (specifically the back of her neck), and sternum pain. The doctor recommended the use of a TENS unit. However, upon reading the instructions, they noticed a warning advising against use in individuals with heart conditions. Marily Shows would like to ask Dr. Renna Cary if it is safe for the patient to use this device.

## 2023-11-27 NOTE — Telephone Encounter (Signed)
 Will forward to Dr Renna Cary and his RN for further advisement.

## 2023-12-01 LAB — DRUG TOX MONITOR 1 W/CONF, ORAL FLD
Amphetamines: NEGATIVE ng/mL (ref <10)
Barbiturates: NEGATIVE ng/mL (ref <10)
Benzodiazepines: NEGATIVE ng/mL (ref <0.50)
Buprenorphine: NEGATIVE ng/mL (ref <0.10)
Cocaine: NEGATIVE ng/mL (ref <5.0)
Codeine: NEGATIVE ng/mL (ref <2.5)
Dihydrocodeine: NEGATIVE ng/mL (ref <2.5)
Fentanyl: NEGATIVE ng/mL (ref <0.10)
Heroin Metabolite: NEGATIVE ng/mL (ref <1.0)
Hydrocodone: NEGATIVE ng/mL (ref <2.5)
Hydromorphone: NEGATIVE ng/mL (ref <2.5)
MARIJUANA: NEGATIVE ng/mL (ref <2.5)
MDMA: NEGATIVE ng/mL (ref <10)
Meprobamate: NEGATIVE ng/mL (ref <2.5)
Methadone: NEGATIVE ng/mL (ref <5.0)
Morphine: NEGATIVE ng/mL (ref <2.5)
Nicotine Metabolite: NEGATIVE ng/mL (ref <5.0)
Norhydrocodone: NEGATIVE ng/mL (ref <2.5)
Noroxycodone: 11.7 ng/mL — ABNORMAL HIGH (ref <2.5)
Opiates: POSITIVE ng/mL — AB (ref <2.5)
Oxycodone: 74.7 ng/mL — ABNORMAL HIGH (ref <2.5)
Oxymorphone: NEGATIVE ng/mL (ref <2.5)
Phencyclidine: NEGATIVE ng/mL (ref <10)
Tapentadol: NEGATIVE ng/mL (ref <5.0)
Tramadol: NEGATIVE ng/mL (ref <5.0)
Zolpidem: NEGATIVE ng/mL (ref <5.0)

## 2023-12-01 LAB — DRUG TOX ALC METAB W/CON, ORAL FLD: Alcohol Metabolite: NEGATIVE ng/mL (ref <25)

## 2023-12-01 NOTE — Telephone Encounter (Signed)
 Left message for daughter, Marily Shows (OK per DPR) OK to use TENS unit.  Requested she call back if any further questions or concerns.

## 2023-12-02 ENCOUNTER — Other Ambulatory Visit: Payer: Self-pay | Admitting: Physical Medicine & Rehabilitation

## 2023-12-02 ENCOUNTER — Ambulatory Visit
Admission: RE | Admit: 2023-12-02 | Discharge: 2023-12-02 | Disposition: A | Discharge status: Home / Self Care | Source: Ambulatory Visit | Attending: Physical Medicine & Rehabilitation

## 2023-12-02 DIAGNOSIS — G8929 Other chronic pain: Secondary | ICD-10-CM

## 2023-12-02 DIAGNOSIS — Z79891 Long term (current) use of opiate analgesic: Secondary | ICD-10-CM

## 2023-12-02 DIAGNOSIS — Z5181 Encounter for therapeutic drug level monitoring: Secondary | ICD-10-CM

## 2023-12-02 DIAGNOSIS — G894 Chronic pain syndrome: Secondary | ICD-10-CM

## 2023-12-02 DIAGNOSIS — M40209 Unspecified kyphosis, site unspecified: Secondary | ICD-10-CM

## 2023-12-14 ENCOUNTER — Telehealth (INDEPENDENT_AMBULATORY_CARE_PROVIDER_SITE_OTHER): Payer: Self-pay

## 2023-12-14 NOTE — Telephone Encounter (Signed)
 Daughter called said patient is having decreased hearing and would like to discuss the best hearing aids to get for her.

## 2023-12-17 NOTE — Telephone Encounter (Signed)
 Hi Tammy Walker,  We are not yet fitting hearing aids. Could you let the patient know that they can take a copy of the hearing test completed in November and take it to the audiologist of their preference?  They should call and ask for a hearing aid evaluation and during that time they will discuss the best hearing aids for her. Thank you for your help! Dr. Colman Deans

## 2023-12-18 ENCOUNTER — Telehealth (INDEPENDENT_AMBULATORY_CARE_PROVIDER_SITE_OTHER): Payer: Self-pay | Admitting: Otolaryngology

## 2023-12-18 ENCOUNTER — Telehealth: Payer: Self-pay | Admitting: Cardiology

## 2023-12-18 MED ORDER — AMIODARONE HCL 200 MG PO TABS: 100.0000 mg | ORAL_TABLET | Freq: Every day | ORAL | 3 refills | Status: DC

## 2023-12-18 NOTE — Telephone Encounter (Signed)
 Pt c/o BP issue: STAT if pt c/o blurred vision, one-sided weakness or slurred speech.  STAT if BP is GREATER than 180/120 TODAY.  STAT if BP is LESS than 90/60 and SYMPTOMATIC TODAY  1. What is your BP concern?   Daughter Marily Shows) stated patient has been having more frequent low BP readings which comes back up  2. Have you taken any BP medication today?  Not taking  3. What are your last 5 BP readings?  Today 10:55 am - 84/61 11:15 am - 89/61 11:45 am - 128/70 12:50 pm - 106/59   4. Are you having any other symptoms (ex. Dizziness, headache, blurred vision, passed out)?   No   Daughter Marily Shows is concerned patient has been having low BP readings every week.  Patient wants to know if there is any additional steps she should be doing.

## 2023-12-18 NOTE — Telephone Encounter (Signed)
 Daughter of patient, Tammy Walker, called and stated her mother's hearing has decreased and they would like to pursue other hearing aid options.  I returned the call per Dr. Nevelyn Barber request and stated that we are currently not dispensing hearing aids and could provide her with a copy of her last Audiogram done in November of 2024 and a list of places she could go to see about her hearing aid options.  Ms. Tammy Walker stated that they already have a copy of the audiogram.  I offered to send her the list of places they could go to see about hearing aids and put that in the outgoing mail tray today.

## 2023-12-18 NOTE — Telephone Encounter (Signed)
 Hugh Madura, MD to Me  Thaddeus Filippo, RN     12/18/23  5:03 PM Lets go ahead and hold the furosemide  20 mg.  With the low blood pressure, she may be getting dehydrated.  I agree with increase salt right now to help raise the blood pressure. It may also be reasonable to decrease her amiodarone  to 100 mg a day.  Lets go ahead and do this Tammy Gathers, MD [place] over information above. She verbalized understanding- she will cut the Amiodarone  200 mg tabs in half, new rx not needed at this time. Updated med list and sent dosage change to pt's pharmacy.   Instructed to let us  know of a weight gain of 2-3 pounds overnight or 5 pounds in one week and any shortness of breath. She verbalized understanding and has f/u visit with afib clinic in July.

## 2023-12-18 NOTE — Addendum Note (Signed)
 Addended by: Darrik Richman L on: 12/18/2023 05:51 PM   Modules accepted: Orders

## 2023-12-18 NOTE — Telephone Encounter (Signed)
 Left message with call back number.

## 2023-12-18 NOTE — Telephone Encounter (Signed)
 These episodes seem to be happening more often than before- around the first few hours after she gets up that her BP goes down-it's been about a month that it has been happening every week.   The bp does come back up- after doing interventions like eating something with salt in it; drinking something with electrolytes; having her move around- move arms and legs to get the blood pumping.  She just wants to know if there is anything else she needs to be doing, etc.. that will help with this issue of low BP's? She asked if it would help if she had a 1/2 cup of coffee (caffeinated) when this happens/or to help this from happening. (She does not drink anything with caffeine at this time)

## 2023-12-30 ENCOUNTER — Encounter (INDEPENDENT_AMBULATORY_CARE_PROVIDER_SITE_OTHER): Payer: Self-pay

## 2023-12-30 ENCOUNTER — Telehealth (INDEPENDENT_AMBULATORY_CARE_PROVIDER_SITE_OTHER): Payer: Self-pay

## 2023-12-30 ENCOUNTER — Telehealth (INDEPENDENT_AMBULATORY_CARE_PROVIDER_SITE_OTHER): Payer: Self-pay | Admitting: Otolaryngology

## 2023-12-30 NOTE — Addendum Note (Signed)
 Addended by: URBANO ALBRIGHT on: 12/30/2023 06:21 PM   Modules accepted: Orders, Level of Service

## 2023-12-30 NOTE — Telephone Encounter (Signed)
 Patient's daughter called and stated that she would like a copy of companies she can take her mother, the patient, to to get hearing aids since we are not currently dispensing them.  I sent a copy of the list we have through the mail on 12/18/2023, but the patient has not received it.  I sent another copy of the list today in the outgoing mail.  I called the patient's daughter and LVM letting them know to expect it.  Also sent a MyChart msg.

## 2023-12-30 NOTE — Telephone Encounter (Signed)
 Daughter left a message, she is requesting a list of ENT/hearing aid specialist to take her mom to, she stated she didn't want to go to [facility], she is looking for something a little more advanced as it's time for her mom to get new hearing aids.  The daughter stated you can mail the list to the address in the chart

## 2024-01-21 ENCOUNTER — Ambulatory Visit (HOSPITAL_COMMUNITY): Payer: Medicare Other | Admitting: Internal Medicine

## 2024-01-26 ENCOUNTER — Ambulatory Visit (HOSPITAL_COMMUNITY)
Admission: RE | Admit: 2024-01-26 | Discharge: 2024-01-26 | Disposition: A | Discharge status: Home / Self Care | Payer: Medicare Other | Source: Ambulatory Visit | Attending: Internal Medicine | Admitting: Internal Medicine

## 2024-01-26 VITALS — BP 170/100 | HR 80 | Ht 60.0 in | Wt 114.6 lb

## 2024-01-26 DIAGNOSIS — I4892 Unspecified atrial flutter: Secondary | ICD-10-CM | POA: Diagnosis not present

## 2024-01-26 DIAGNOSIS — I447 Left bundle-branch block, unspecified: Secondary | ICD-10-CM | POA: Insufficient documentation

## 2024-01-26 DIAGNOSIS — Z5181 Encounter for therapeutic drug level monitoring: Secondary | ICD-10-CM | POA: Diagnosis not present

## 2024-01-26 DIAGNOSIS — D6869 Other thrombophilia: Secondary | ICD-10-CM | POA: Insufficient documentation

## 2024-01-26 DIAGNOSIS — G8929 Other chronic pain: Secondary | ICD-10-CM | POA: Insufficient documentation

## 2024-01-26 DIAGNOSIS — I440 Atrioventricular block, first degree: Secondary | ICD-10-CM | POA: Diagnosis not present

## 2024-01-26 DIAGNOSIS — N182 Chronic kidney disease, stage 2 (mild): Secondary | ICD-10-CM | POA: Insufficient documentation

## 2024-01-26 DIAGNOSIS — M549 Dorsalgia, unspecified: Secondary | ICD-10-CM | POA: Insufficient documentation

## 2024-01-26 DIAGNOSIS — I5032 Chronic diastolic (congestive) heart failure: Secondary | ICD-10-CM | POA: Insufficient documentation

## 2024-01-26 DIAGNOSIS — I4819 Other persistent atrial fibrillation: Secondary | ICD-10-CM | POA: Diagnosis present

## 2024-01-26 DIAGNOSIS — Z79899 Other long term (current) drug therapy: Secondary | ICD-10-CM | POA: Diagnosis not present

## 2024-01-26 DIAGNOSIS — Z7901 Long term (current) use of anticoagulants: Secondary | ICD-10-CM | POA: Diagnosis not present

## 2024-01-26 DIAGNOSIS — I4891 Unspecified atrial fibrillation: Secondary | ICD-10-CM

## 2024-01-26 DIAGNOSIS — I700 Atherosclerosis of aorta: Secondary | ICD-10-CM | POA: Insufficient documentation

## 2024-01-26 DIAGNOSIS — I130 Hypertensive heart and chronic kidney disease with heart failure and stage 1 through stage 4 chronic kidney disease, or unspecified chronic kidney disease: Secondary | ICD-10-CM | POA: Diagnosis not present

## 2024-01-26 NOTE — Progress Notes (Signed)
 Primary Care Physician: Alben Therisa MATSU, PA Primary Cardiologist: Dr. Jeffrie Primary Electrophysiologist: Dr. Waddell Referring Physician:    [name] is a 89 y.o. female with a history of HTN, chronic HFpEF, 1st degree AV block, LBBB, aortic atherosclerosis, chronic back pain, CKD stage 2, and persistent atrial fibrillation who presents for consultation in the [facility].  The patient was initially diagnosed with atrial fibrillation in 2002. History of digoxin  and metoprolol  converted to NSR but later metoprolol  discontinued due to dizziness and hypotension. History of amiodarone  discontinued due to eye deposits seen by ophthalmology in 2021. She is currently on Multaq  400 mg BID which was started in 2023. She was recently admitted 4/17-19/24 due to chest pain and shortness of breath found to be in Afib with RVR. Discharged on lasix  20 mg daily and Multaq  400 mg BID. Patient is on edoxaban  (Savaysa ) 30 mg daily for a CHADS2VASC score of 5.  On evaluation 10/20/22, she is currently in Afib. She states she feels a little tired but otherwise no shortness of breath or chest pain. She can do her day to day activities but it takes her a little longer.   She is compliant with anticoagulation and has not missed any doses. She has no bleeding concerns.  On follow up 11/05/22, she is currently in rate controlled Afib. She is s/p DCCV on 10/29/22 with successful conversion to NSR x 1 shock. Overall, she has felt much better compared to before cardioversion. She notes to feel tired more so when in Afib but can do more of her daily activities since cardioversion. She has been compliant with Multaq  and Savaysa . No chest pain or shortness of breath.  On follow up 12/17/22, she is currently in rate controlled atrial flutter. She is s/p successful DCCV on 12/01/22 currently taking amiodarone  200 mg daily. Daughter with patient here today notes the day after cardioversion she was back out  of rhythm. Patient admits she does not readily feel any different now compared to day of cardioversion. She is tired but also has pain issues which affects her sleep. Her back pain specifically makes her stop walking and sit down to rest and recover. She is compliant with amiodarone  200 mg daily and Savaysa .   On follow up 05/21/23, she is currently in NSR. Currently taking amiodarone  200 mg daily. Seen by primary cardiologist in September and agreed to proceed with amiodarone  for rate control; no interventions planned. No bleeding issues on Savaysa .  On follow up 01/26/24, she is currently in NSR. Patient is taking amiodarone  100 mg daily. Dosage lowered by Dr. Jeffrie in June due to hypotension (also stopped lasix ). She and daughter note fluctuation in blood pressure with several elevated readings this month. No missed doses of Savaysa .  Today, she denies symptoms of palpitations, chest pain, shortness of breath, orthopnea, PND, lower extremity edema, dizziness, presyncope, syncope, snoring, daytime somnolence, bleeding, or neurologic sequela. The patient is tolerating medications without difficulties and is otherwise without complaint today.   Atrial Fibrillation Risk Factors:  she does not have symptoms or diagnosis of sleep apnea. she does not have a history of rheumatic fever. she does not have a history of alcohol use. The patient does not have a history of early familial atrial fibrillation or other arrhythmias.  she has a BMI of Body mass index is 22.38 kg/m.SABRA Filed Weights   01/26/24 1337  Weight: 52 kg    Family History  Problem Relation Age of Onset  Heart disease Mother    High blood pressure Mother    COPD Mother    Heart failure Mother    Heart disease Father    Heart disease Brother    Heart failure Brother    Hyperlipidemia Daughter    Hypertension Daughter     Atrial Fibrillation Management history:  Previous antiarrhythmic drugs: Amiodarone  stopped due to eye  deposits previously. Previous cardioversions: 2002, 10/29/22 Previous ablations: None Anticoagulation history: Savaysa    Past Medical History:  Diagnosis Date   Allergy    Arrhythmia    Atrial fibrillation (HCC)    GERD (gastroesophageal reflux disease)    History of bone scan    Bone Density Scans 2017 & 2019 Dr. Malcolm    History of chest x-ray    Apart from large hiatus hernia, normal heart, lungs and mediastinum. Per records from [facility]     History of CT scan of abdomen 10/24/2017   Gallstones within a thin-waled gallbladder, Per records from [facility]    History of echocardiogram    [facility]    History of EKG    Sinus rhythm, borderline 1st deg block, LAD completely changed axis, LBBB(old). Per records from [facility]    Hyperlipidemia    Hypertension    Insomnia    Left lower lobe pneumonia 09/20/2015   Per records from [facility]    Nodule of right lung    Per records from [facility],    Osteoporosis    on fosamax   Osteoporosis    Reduced mobility    Past Surgical History:  Procedure Laterality Date   APPENDECTOMY  [address], [place]    CARDIOVERSION N/A 10/29/2022   Procedure: CARDIOVERSION;  Surgeon: Francyne Headland, MD;  Location: [facility] INVASIVE CV LAB;  Service: Cardiovascular;  Laterality: N/A;   CARDIOVERSION N/A 12/01/2022   Procedure: CARDIOVERSION;  Surgeon: Santo Stanly LABOR, MD;  Location: [facility] INVASIVE CV LAB;  Service: Cardiovascular;  Laterality: N/A;   CT SCAN     2017, 2018, 2019 -- re lungs see records    TUBAL LIGATION  1978   [place], [place], [place]     Current Outpatient Medications  Medication Sig Dispense Refill   acetaminophen  (TYLENOL ) 500 MG tablet 2 tablets in the AM, 1 tablet in the afternoon, and 1 tablet at night Orally three times a day (Patient taking differently: Taking 3 tablets by mouth daily and  additional 1 tablet if needed)     amiodarone  (PACERONE ) 200 MG tablet Take 0.5 tablets (100 mg total) by mouth daily. 45 tablet 3   atorvastatin  (LIPITOR) 20 MG tablet TAKE 1 TABLET(20 MG) BY MOUTH AT BEDTIME 90 tablet 3   bisacodyl  (DULCOLAX) 5 MG EC tablet Take 10 mg by mouth at bedtime.     Ca Phosphate-Cholecalciferol  (CALCIUM  WITH D3) 250-12.5 MG-MCG CHEW Chew 2 tablets by mouth 2 (two) times daily. (Patient taking differently: Chew 1 tablet by mouth 2 (two) times daily.)     carboxymethylcellulose (REFRESH TEARS) 0.5 % SOLN Place 1 drop into both eyes 3 (three) times daily as needed (dry eyes).     cetirizine (ZYRTEC) 10 MG tablet Take 10 mg by mouth every morning.     clotrimazole (LOTRIMIN) 1 % cream Apply 1 application. topically 2 (two) times daily as needed (fungus).     denosumab  (PROLIA ) 60 MG/ML SOSY injection Inject 60 mg into the skin every 6 (  six) months.     diclofenac  Sodium (VOLTAREN ) 1 % GEL Apply 4 g topically 4 (four) times daily as needed (back pain).     edoxaban  (SAVAYSA ) 30 MG TABS tablet Take 1 tablet (30 mg total) by mouth daily. 90 tablet 3   Hydrocortisone  (PREPARATION H EX) Apply 1 Application topically daily as needed (hemorrhoids).     ipratropium (ATROVENT ) 0.03 % nasal spray Place 2 sprays into both nostrils every 12 (twelve) hours. 30 mL 12   LINZESS 72 MCG capsule Take 72 mcg by mouth daily as needed. (Patient taking differently: Take 72 mcg by mouth as needed.)     Multiple Vitamins-Minerals (MULTIVITAMIN ADULTS PO) Calcium  250 mg and vitamin D - 500 Units- taking 3 gummies by mouth daily     Multiple Vitamins-Minerals (PRESERVISION AREDS 2 PO) Take 1 capsule by mouth 2 (two) times daily.     ondansetron  (ZOFRAN ) 4 MG tablet Take 1 tablet (4 mg total) by mouth every 8 (eight) hours as needed for nausea or vomiting. (Patient taking differently: Take 4 mg by mouth as needed for nausea or vomiting.) 20 tablet 0   oxyCODONE  (ROXICODONE ) 5 MG/5ML solution Take 2.5  mLs by mouth 2 (two) times daily as needed for breakthrough pain.     OXYCONTIN  10 MG 12 hr tablet 1 tablet Orally every 12 hrs for 30 days     Polyethyl Glycol-Propyl Glycol (SYSTANE) 0.4-0.3 % GEL ophthalmic gel Place 1 Application into both eyes at bedtime.     polyethylene glycol (MIRALAX  / GLYCOLAX ) 17 g packet Take 17 g by mouth 2 (two) times daily.     pregabalin  (LYRICA ) 25 MG capsule Take 25 mg by mouth See admin instructions. Taking 75 mg by mouth in the morning, 25 mg afternoon and 25 mg in the evening     pregabalin  (LYRICA ) 75 MG capsule Take 75 mg by mouth daily.     shark liver oil-cocoa butter (PREPARATION H) 0.25-3-85.5 % suppository Place 1 suppository rectally as needed for hemorrhoids.     sodium chloride  (OCEAN) 0.65 % nasal spray as directed Nasally     triamcinolone cream (KENALOG) 0.1 % 1 Application. (Patient taking differently: Apply 1 Application topically as needed.)     No current facility-administered medications for this encounter.    No Known Allergies  ROS- All systems are reviewed and negative except as per the HPI above.  Physical Exam: Vitals:   01/26/24 1337  BP: (!) 170/100  Pulse: 80  Weight: 52 kg  Height: 5' (1.524 m)    GEN- The patient is well appearing, alert and oriented x 3 today.   Neck - no JVD or carotid bruit noted Lungs- Clear to ausculation bilaterally, normal work of breathing Heart- Regular rate and rhythm, no murmurs, rubs or gallops, PMI not laterally displaced Extremities- no clubbing, cyanosis, or edema Skin - no rash or ecchymosis noted  Wt Readings from Last 3 Encounters:  01/26/24 52 kg  07/13/23 49.9 kg  05/21/23 50.6 kg   EKG today demonstrates  Vent. rate 80 BPM PR interval 222 ms QRS duration 150 ms QT/QTcB 420/484 ms P-R-T axes 230 132 -50 Sinus rhythm with 1st degree AV block Right axis deviation Left ventricular hypertrophy with QRS widening and repolarization abnormality ( Romhilt-Estes ) Abnormal  ECG When compared with ECG of 21-May-2023 13:53, Previous ECG is present  Echo 10/17/22 demonstrated:  1. Left ventricular ejection fraction, by estimation, is 45 to 50%. The  left ventricle has mildly decreased  function. The left ventricle  demonstrates regional wall motion abnormalities with septal hypokinesis  and septal-lateral dyssynchrony consistent  with LBBB. There is moderate asymmetric left ventricular hypertrophy of  the basal-septal segment. Left ventricular diastolic parameters are  indeterminate.   2. Right ventricular systolic function is normal. The right ventricular  size is normal. There is moderately elevated pulmonary artery systolic  pressure. The estimated right ventricular systolic pressure is 45.7 mmHg.   3. Left atrial size was severely dilated.   4. Right atrial size was severely dilated.   5. The mitral valve is abnormal. 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 No evidence of  mitral stenosis.   6. Tricuspid valve regurgitation is moderate.   7. The aortic valve is tricuspid. There is moderate calcification of the  aortic valve. Aortic valve regurgitation is mild. Aortic valve  sclerosis/calcification is present, without any evidence of aortic  stenosis. Aortic valve mean gradient measures 6.2  mmHg.   8. Aortic dilatation noted. There is mild dilatation of the descending  aorta, measuring 39 mm.   9. The inferior vena cava is normal in size with <50% respiratory  variability, suggesting right atrial pressure of 8 mmHg.  10. The patient appeared to be in atrial fibrillation.   Epic records are reviewed at length today.  CHA2DS2-VASc Score = 5  The patient's score is based upon: CHF History: 0 HTN History: 1 Diabetes History: 0 Stroke History: 0 Vascular Disease History: 1 Age Score: 2 Gender Score: 1       ASSESSMENT AND PLAN: Persistent Atrial Fibrillation (ICD10:  I48.19) / Atrial  flutter The patient's CHA2DS2-VASc score is 5, indicating a 7.2% annual risk of stroke.   S/p DCCV on 10/29/22.  S/p DCCV on 12/01/22 with successful conversion to NSR on amiodarone  200 mg daily.  She is currently in NSR.   High risk medication monitoring (ICD10: [id number]) Patient requires ongoing monitoring for anti-arrhythmic medication which has the potential to cause life threatening arrhythmias or AV block. Qtc stable. Continue amiodarone  100 mg daily. Cmet and TSH drawn today.  Current guidelines for management of Afib indicate amiodarone -related corneal microdeposits (epithelial keratopathy) are common, but visual abnormalities and light sensitivity are rare. Therefore, an ophthalmologic examination is reasonable only if visual abnormalities develop. Patient states when deposits were noted she never had any visual symptoms.    2. Secondary Hypercoagulable State (ICD10:  D68.69) The patient is at significant risk for stroke/thromboembolism based upon her CHA2DS2-VASc Score of 5.  Continue Savaysa .  No missed doses.   3. HTN Will take BP diary for 2 weeks, check BP at same time, and contact office with updated readings.    Follow up with cardiologist in 6 months. Follow up 1 year for amiodarone  surveillance.    Fairy Heinrich, PA-C Afib Clinic [facility] [address] [place], [place] [address] [phone] 01/26/2024 2:37 PM

## 2024-01-27 ENCOUNTER — Ambulatory Visit (HOSPITAL_COMMUNITY): Payer: Self-pay | Admitting: Internal Medicine

## 2024-01-27 LAB — COMPREHENSIVE METABOLIC PANEL WITH GFR
ALT: 8 IU/L (ref 0–32)
AST: 16 IU/L (ref 0–40)
Albumin: 4.2 g/dL (ref 3.6–4.6)
Alkaline Phosphatase: 45 IU/L (ref 44–121)
BUN/Creatinine Ratio: 20 (ref 12–28)
BUN: 13 mg/dL (ref 10–36)
Bilirubin Total: 0.6 mg/dL (ref 0.0–1.2)
CO2: 23 mmol/L (ref 20–29)
Calcium: 9.5 mg/dL (ref 8.7–10.3)
Chloride: 98 mmol/L (ref 96–106)
Creatinine, Ser: 0.64 mg/dL (ref 0.57–1.00)
Globulin, Total: 2.1 g/dL (ref 1.5–4.5)
Glucose: 86 mg/dL (ref 70–99)
Potassium: 4.7 mmol/L (ref 3.5–5.2)
Sodium: 135 mmol/L (ref 134–144)
Total Protein: 6.3 g/dL (ref 6.0–8.5)
eGFR: 83 mL/min/1.73 (ref >59)

## 2024-01-27 LAB — TSH: TSH: 3.08 u[IU]/mL (ref 0.450–4.500)

## 2024-02-01 ENCOUNTER — Ambulatory Visit: Admitting: Physical Medicine & Rehabilitation

## 2024-02-12 ENCOUNTER — Encounter: Attending: Physical Medicine & Rehabilitation | Admitting: Physical Medicine & Rehabilitation

## 2024-02-12 ENCOUNTER — Encounter: Payer: Self-pay | Admitting: Physical Medicine & Rehabilitation

## 2024-02-12 VITALS — BP 167/90 | HR 80 | Ht 60.0 in

## 2024-02-12 DIAGNOSIS — M546 Pain in thoracic spine: Secondary | ICD-10-CM | POA: Insufficient documentation

## 2024-02-12 DIAGNOSIS — G8929 Other chronic pain: Secondary | ICD-10-CM | POA: Diagnosis present

## 2024-02-12 DIAGNOSIS — G894 Chronic pain syndrome: Secondary | ICD-10-CM | POA: Insufficient documentation

## 2024-02-12 DIAGNOSIS — M40209 Unspecified kyphosis, site unspecified: Secondary | ICD-10-CM | POA: Insufficient documentation

## 2024-02-12 DIAGNOSIS — Z79891 Long term (current) use of opiate analgesic: Secondary | ICD-10-CM | POA: Insufficient documentation

## 2024-02-12 MED ORDER — OXYCODONE HCL 5 MG PO TABS: 2.5000 mg | ORAL_TABLET | Freq: Two times a day (BID) | ORAL | 0 refills | Status: DC | PRN

## 2024-02-12 MED ORDER — LIDOCAINE 4 % EX PTCH: 1.0000 | MEDICATED_PATCH | CUTANEOUS | 4 refills | Status: DC

## 2024-02-12 MED ORDER — OXYCODONE HCL ER 10 MG PO T12A: 10.0000 mg | EXTENDED_RELEASE_TABLET | Freq: Two times a day (BID) | ORAL | 0 refills | Status: DC

## 2024-02-12 MED ORDER — OXYCONTIN 10 MG PO T12A: 10.0000 mg | EXTENDED_RELEASE_TABLET | Freq: Two times a day (BID) | ORAL | 0 refills | Status: DC

## 2024-02-12 NOTE — Progress Notes (Signed)
 Subjective:    Patient ID: Tammy Walker, female    DOB: 09-22-1930, 89 y.o.   MRN: [id number]  HPI  HPI  Tammy Walker is a 89 y.o. year old female  who  has a past medical history of Allergy, Arrhythmia, Atrial fibrillation (HCC), GERD (gastroesophageal reflux disease), History of bone scan, History of chest x-ray, History of CT scan of abdomen (10/24/2017), History of echocardiogram, History of EKG, Hyperlipidemia, Hypertension, Insomnia, Left lower lobe pneumonia (09/20/2015), Nodule of right lung, Osteoporosis, Osteoporosis, and Reduced mobility.   They are presenting to PM&R clinic as a new patient for pain management evaluation. They were referred by Therisa Holm, PA for treatment of chronic pain.  Thank you for the referral of this very pleasant patient.    Patient is here with her daughter.  Patient is a little hard of hearing.  She reports her back pain began around 2014 when she was getting ready for a trip to [place]. Reviewed x-ray results from T-spine x-ray 07/08/2012 which showed compression fractures T9 and L2 and thoracic kyphosis.  Patient reports she was in [place] and went to the hospital and was treated with morphine  that did not help but had different medication and was beneficial. Since this time she has had worsening pain in her thoracic spine and she also has pain in her sternum.  Pain is worsened with standing bending and reaching.  Pain wakes her up in the morning.  Ice and heat to help her pain.    Patient and daughter report she was originally taking the extended release 5 mg oxycodone  in [place] but this was not available in the [place] .  She was tried on 10 mg extended release hydrocodone  and then this was increased to 15 mg extended release hydrocodone .  She had difficulty getting this medication from the pharmacy and thus was started on Norco 10 3 times a day to achieve a similar dose.  She felt like she was always playing catch up with her pain on the IR hydrocodone  and  was changed over recently to oxycodone  ER 10 mg.  She feels like this medicine is doing about the same as the prior hydrocodone  ER.  She also takes Lyrica , hard to tell how much it is helping but she did notice a benefit when it was last increased.  She does have occasional issues with constipation, and is cautious to increase medication dose further.   Red flag symptoms: No red flags for back pain endorsed in Hx or ROS  Medications tried: Topical medications Voltaren  gel helps  Nsaids - blood thinners Limited Tylenol   - she takes three to four times a day with some benefit   Opiates  Hydrocodone  IR/hydrocodone  ER previously with benefit.  Hydrocodone  ER was hard to get from the pharmacy Now on oxycodone  ER  Lyrica  75mg  TID with suspected benefit TCAs  - Denies  SNRIs - Denies    Other treatments: PT- helped get her moving, unsure if it really helped with pain Chiropractor- didn't help TENs unit - Denies  Injections- denies Surgery denies  Other - Ice and heat help     Prior UDS results: No results found for: LABOPIA, COCAINSCRNUR, LABBENZ, AMPHETMU, THCU, LABBARB   Interval Hx 02/13/24 Reports ongoing pain management challenges. Wakes around 8 AM, takes until 9:30 AM to dress and have breakfast due to stiffness. Experiences bone pain in the morning. Developed stabbing pains from left side, described as little darts. Pain in sternum occurs after getting  up, sometimes present upon waking. Pain worsens when sitting after lying down. Sleeps well..  Currently taking OxyContin  10mg  medication twice daily (20mg  total). Uses liquid oxycodone  2.5ml (half teaspoon) for breakthrough pain - last used yesterday when missed morning medications. Prescription expired, needs refill.  This was last filled over a year ago.  Uses hot water bottle for back pain relief. Manages constipation with laxatives and having regular bowel movements.  Pain Inventory Average Pain 2-9 Pain Right  Now 5 My pain is intermittent, constant, sharp, and stabbing  In the last 24 hours, has pain interfered with the following? General activity 3 Relation with others 0 Enjoyment of life 0 What TIME of day is your pain at its worst? morning  and daytime Sleep (in general) Fair  Pain is worse with: walking, standing, and some activites Pain improves with: rest and medication Relief from Meds: 8  walk without assistance walk with assistance use a walker ability to climb steps?  yes do you drive?  no use a wheelchair transfers alone Do you have any goals in this area?  yes  retired I need assistance with the following:  household duties and shopping Do you have any goals in this area?  yes  bladder control problems weakness  Any changes since last visit?  no  Any changes since last visit?  yes    Family History  Problem Relation Age of Onset   Heart disease Mother    High blood pressure Mother    COPD Mother    Heart failure Mother    Heart disease Father    Heart disease Brother    Heart failure Brother    Hyperlipidemia Daughter    Hypertension Daughter    Social History   Socioeconomic History   Marital status: Widowed    Spouse name: Not on file   Number of children: Not on file   Years of education: Not on file   Highest education level: Not on file  Occupational History   Not on file  Tobacco Use   Smoking status: Never   Smokeless tobacco: Never   Tobacco comments:    Never smoked 05/21/23  Vaping Use   Vaping status: Never Used  Substance and Sexual Activity   Alcohol use: Not Currently    Comment: less than one drink a week   Drug use: No   Sexual activity: Never    Birth control/protection: None  Other Topics Concern   Not on file  Social History Narrative   Social History      Diet? I watch my intake of salt, sugar, and fat      Do you drink/eat things with caffeine? Yes       Marital status?      Widowed                                What year were you married? 1960      Do you live in a house, apartment, assisted living, condo, trailer, etc.? House       Is it one or more stories? one      How many persons live in your home? 2      Do you have any pets in your home? (please list) yes 2 cats       Highest level of education completed? 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             yes                         Type & how often? Strength & Balance       Advanced Directives      Do you have a living will?  No       Do you have a DNR form?        No                           If not, do you want to discuss one? No       Do you have signed POA/HPOA for forms? Yes       Functional Status      Do you have difficulty bathing or dressing yourself? No       Do you have difficulty preparing food or eating? No       Do you have difficulty managing your medications? No       Do you have difficulty managing your finances? No       Do you have difficulty affording your medications? No       Social Drivers of Corporate investment banker Strain: Not on file  Food Insecurity: Not on file  Transportation Needs: Not on file  Physical Activity: Not on file  Stress: Not on file  Social Connections: Not on file   Past Surgical History:  Procedure Laterality Date   APPENDECTOMY  1947   [place], [place]    CARDIOVERSION N/A 10/29/2022   Procedure: CARDIOVERSION;  Surgeon: Francyne Headland, MD;  [facility];  Service: Cardiovascular;  Laterality: N/A;   CARDIOVERSION N/A 12/01/2022   Procedure: CARDIOVERSION;  Surgeon: Santo Stanly LABOR, MD;  [facility];  Service: Cardiovascular;  Laterality: N/A;   CT SCAN     2017, 2018, 2019 -- re lungs see records    TUBAL LIGATION  1978   [facility] [place], [place]    Past Medical History:  Diagnosis Date   Allergy    Arrhythmia    Atrial  fibrillation (HCC)    GERD (gastroesophageal reflux disease)    History of bone scan    Bone Density Scans 2017 & 2019 Dr. Malcolm    History of chest x-ray    Apart from large hiatus hernia, normal heart, lungs and mediastinum. [facility]     History of CT scan of abdomen 10/24/2017   Gallstones within a thin-waled gallbladder, Per [facility]    [facility] echocardiogram    [facility]    History of EKG    Sinus rhythm, borderline 1st deg block, LAD completely changed axis, LBBB(old). Per [facility]    Hyperlipidemia    Hypertension    Insomnia    Left lower lobe pneumonia 09/20/2015   Per records from [facility]    Nodule of right lung    Per records from [facility],    Osteoporosis    on fosamax   Osteoporosis    Reduced mobility    BP (!) 167/90   Pulse 80   Ht 5' (1.524 m)   SpO2 96%  BMI 22.38 kg/m   Opioid Risk Score:   Fall Risk Score:  `1  Depression screen [facility] 2/9     02/12/2024    3:19 PM 11/26/2023    2:39 PM 08/22/2019   10:45 AM 09/19/2016    3:22 PM 07/26/2015   10:29 AM  Depression screen PHQ 2/9  Decreased Interest 0 0 0 0 0  Down, Depressed, Hopeless 0 0 0 0 0  PHQ - 2 Score 0 0 0 0 0  Altered sleeping  0     Tired, decreased energy  2     Change in appetite  0     Feeling bad or failure about yourself   0     Trouble concentrating  0     Moving slowly or fidgety/restless  0     Suicidal thoughts  0     PHQ-9 Score  2       Review of Systems  Musculoskeletal:  Positive for back pain and gait problem.       Left Sternum pain, pain in the left rib  All other systems reviewed and are negative.      Objective:   Physical Exam  Gen: no distress, normal appearing HEENT: oral mucosa pink and moist, NCAT Chest: normal effort, normal rate of breathing Abd: soft, non-distended Ext: no  edema Psych: Very pleasant Skin: intact Neuro: Alert and awake, follows commands, cranial nerves II through XII grossly intact although hard of hearing, normal speech and language No focal motor deficits noted Sensory exam normal for light touch and pain in all 4 limbs. No abnormal tone noted Musculoskeletal:  Severe kyphosis and thoracic spine and forward neck posture Mild tenderness lower thoracic spine   T-spine x-ray 02/13/2024 FINDINGS: Vertebral plana is noted at T12 with significant increased kyphosis. Mild osteophytic changes are seen. Mild T9 compression deformity is noted slightly progressed when compared with the prior exam. No paraspinal mass is noted. Hiatal hernia is noted.   IMPRESSION: T12 vertebral plana new from the prior exam.   Slight progression of T9 compression fracture.      Assessment & Plan:   Chronic thoracic, sternal and neck pain likely related to thoracic kyphosis and compression fractures -TENS unit, Zynex Nexwave noted to be expensive.  Advised trying over-the-counter version.  She reports cardiologist was okay with her using TENS unit. -Consider trying shockwave therapy -Reviewed thoracic spine x-ray as above - Will continue on this medication OxyContin  10 mg every 12 hours for now.   -Patient is concerned about increased constipation if opioids are increased, this is a possibility although could add additional laxatives.  Appears she is managing her bowels fairly well --Continue UDS and pill counts.  Continue PDMP monitoring.  Pain contract completed prior visit. -Discussed bringing pill bottle with any medications even if empty to all appointments - Will start oxycodone  2.5 mg (half tablet of 5 mg tab) for breakthrough pain.  Patient has reported this dosage in liquid medication has been beneficial for breakthrough pain.

## 2024-02-12 NOTE — Progress Notes (Deleted)
 Subjective:    Patient ID: Tammy Walker, female    DOB: [birth year], 89 y.o.   MRN: [id number]  HPI   Pain Inventory Average Pain {NUMBERS; 0-10:5044} Pain Right Now {NUMBERS; 0-10:5044} My pain is {PAIN DESCRIPTION:21022940}  In the last 24 hours, has pain interfered with the following? General activity {NUMBERS; 0-10:5044} Relation with others {NUMBERS; 0-10:5044} Enjoyment of life {NUMBERS; 0-10:5044} What TIME of day is your pain at its worst? {time of day:24191} Sleep (in general) {BHH GOOD/FAIR/POOR:22877}  Pain is worse with: {ACTIVITIES:21022942} Pain improves with: {PAIN IMPROVES TPUY:78977056} Relief from Meds: {NUMBERS; 0-10:5044}  Family History  Problem Relation Age of Onset   Heart disease Mother    High blood pressure Mother    COPD Mother    Heart failure Mother    Heart disease Father    Heart disease Brother    Heart failure Brother    Hyperlipidemia Daughter    Hypertension Daughter    Social History   Socioeconomic History   Marital status: Widowed    Spouse name: Not on file   Number of children: Not on file   Years of education: Not on file   Highest education level: Not on file  Occupational History   Not on file  Tobacco Use   Smoking status: Never   Smokeless tobacco: Never   Tobacco comments:    Never smoked 05/21/23  Vaping Use   Vaping status: Never Used  Substance and Sexual Activity   Alcohol use: Not Currently    Comment: less than one drink a week   Drug use: No   Sexual activity: Never    Birth control/protection: None  Other Topics Concern   Not on file  Social History Narrative   Social History      Diet? I watch my intake of salt, sugar, and fat      Do you drink/eat things with caffeine? Yes       Marital status?      Widowed                               What year were you married? 1960      Do you live in a house, apartment, assisted living, condo, trailer, etc.? House       Is it one or more stories? one       How many persons live in your home? 2      Do you have any pets in your home? (please list) yes 2 cats       Highest level of education completed? 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             yes                         Type & how often? Strength & Balance       Advanced Directives      Do you have a living will?  No       Do you have a DNR form?        No                           If not, do  you want to discuss one? No       Do you have signed POA/HPOA for forms? Yes       Functional Status      Do you have difficulty bathing or dressing yourself? No       Do you have difficulty preparing food or eating? No       Do you have difficulty managing your medications? No       Do you have difficulty managing your finances? No       Do you have difficulty affording your medications? No       Social Drivers of Corporate investment banker Strain: Not on file  Food Insecurity: Not on file  Transportation Needs: Not on file  Physical Activity: Not on file  Stress: Not on file  Social Connections: Not on file   Past Surgical History:  Procedure Laterality Date   APPENDECTOMY  1947   [place], [place]    CARDIOVERSION N/A 10/29/2022   Procedure: CARDIOVERSION;  Surgeon: Francyne Headland, MD;  [facility];  Service: Cardiovascular;  Laterality: N/A;   CARDIOVERSION N/A 12/01/2022   Procedure: CARDIOVERSION;  Surgeon: Santo Stanly LABOR, MD;  [facility];  Service: Cardiovascular;  Laterality: N/A;   CT SCAN     2017, 2018, 2019 -- re lungs see records    TUBAL LIGATION  1978   [facility], [place], [place]    Past Surgical History:  Procedure Laterality Date   APPENDECTOMY  1947   [place], [place]    CARDIOVERSION N/A 10/29/2022   Procedure: CARDIOVERSION;  Surgeon: Francyne Headland, MD;  [facility];  Service:  Cardiovascular;  Laterality: N/A;   CARDIOVERSION N/A 12/01/2022   Procedure: CARDIOVERSION;  Surgeon: Santo Stanly LABOR, MD;  [facility];  Service: Cardiovascular;  Laterality: N/A;   CT SCAN     2017, 2018, 2019 -- re lungs see records    TUBAL LIGATION  1978   [facility], [place], [place]    Past Medical History:  Diagnosis Date   Allergy    Arrhythmia    Atrial fibrillation (HCC)    GERD (gastroesophageal reflux disease)    History of bone scan    Bone Density Scans 2017 & 2019 Dr. Malcolm    History of chest x-ray    Apart from large hiatus hernia, normal heart, lungs and mediastinum. Per records from [facility]     History of CT scan of abdomen 10/24/2017   Gallstones within a thin-waled gallbladder, [facility]    [facility] echocardiogram    [facility]    History of EKG    Sinus rhythm, borderline 1st deg block, LAD completely changed axis, LBBB(old). Per [facility]    Hyperlipidemia    Hypertension    Insomnia    Left lower lobe pneumonia 09/20/2015   Per records [facility]    Nodule of right lung    Per records from [facility],    Osteoporosis    on fosamax   Osteoporosis    Reduced mobility    There were no vitals taken for this visit.  Opioid Risk Score:   Fall Risk Score:  `1  Depression screen [facility] 2/9     11/26/2023    2:39 PM 08/22/2019   10:45 AM 09/19/2016    3:22 PM 07/26/2015  10:29 AM  Depression screen PHQ 2/9  Decreased Interest 0 0 0 0  Down, Depressed, Hopeless 0 0 0 0  PHQ - 2 Score 0 0 0 0  Altered sleeping 0     Tired, decreased energy 2     Change in appetite 0     Feeling bad or failure about yourself  0     Trouble concentrating 0     Moving slowly or fidgety/restless 0     Suicidal thoughts 0     PHQ-9 Score 2       Review of Systems     Objective:   Physical  Exam        Assessment & Plan:

## 2024-02-13 ENCOUNTER — Encounter: Payer: Self-pay | Admitting: Physical Medicine & Rehabilitation

## 2024-02-22 IMAGING — DX DG ABD PORTABLE 1V
2 series · 2 of 2 positions shown · non-contrast
Comparison: 09/06/2021 and prior studies

CLINICAL DATA: Small-bowel obstruction follow-up.

EXAM:
PORTABLE ABDOMEN - 1 VIEW

[abdomen supine (1 of 2)]
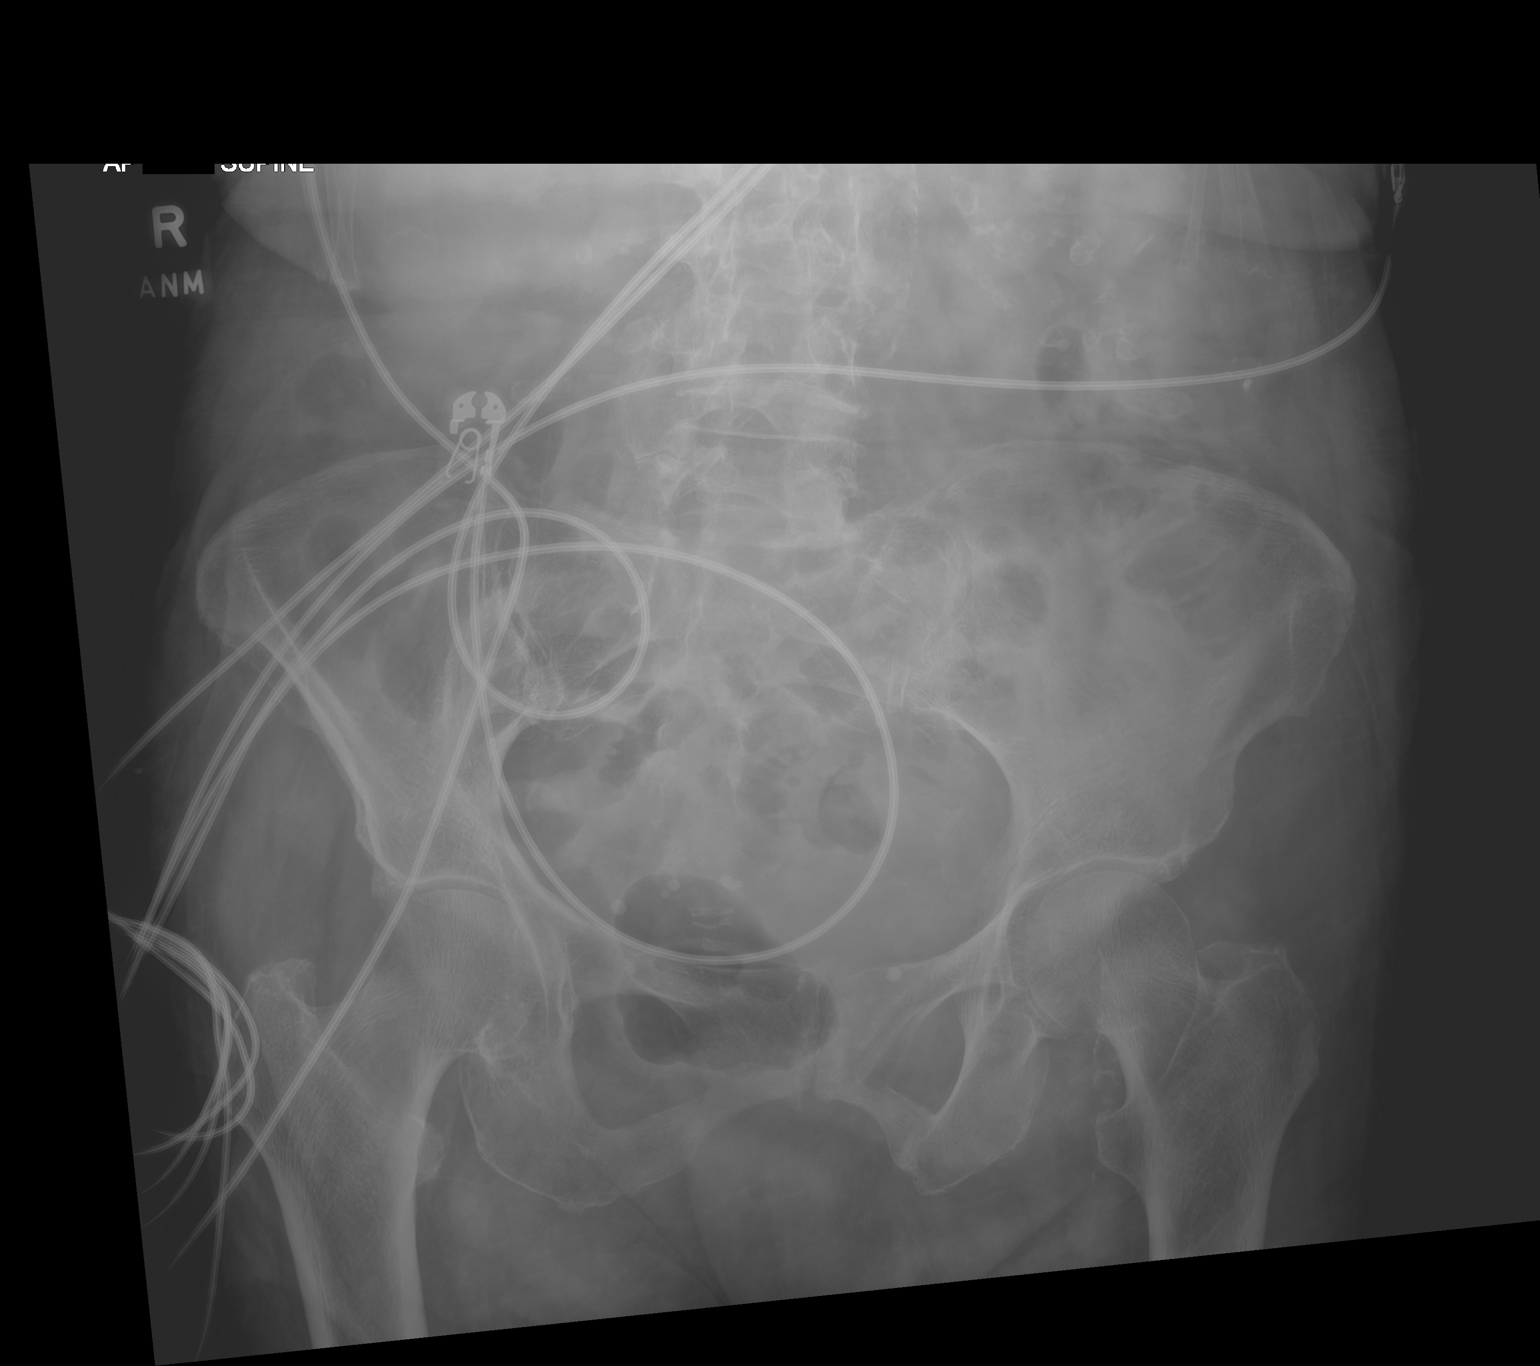

[abdomen supine (2 of 2)]
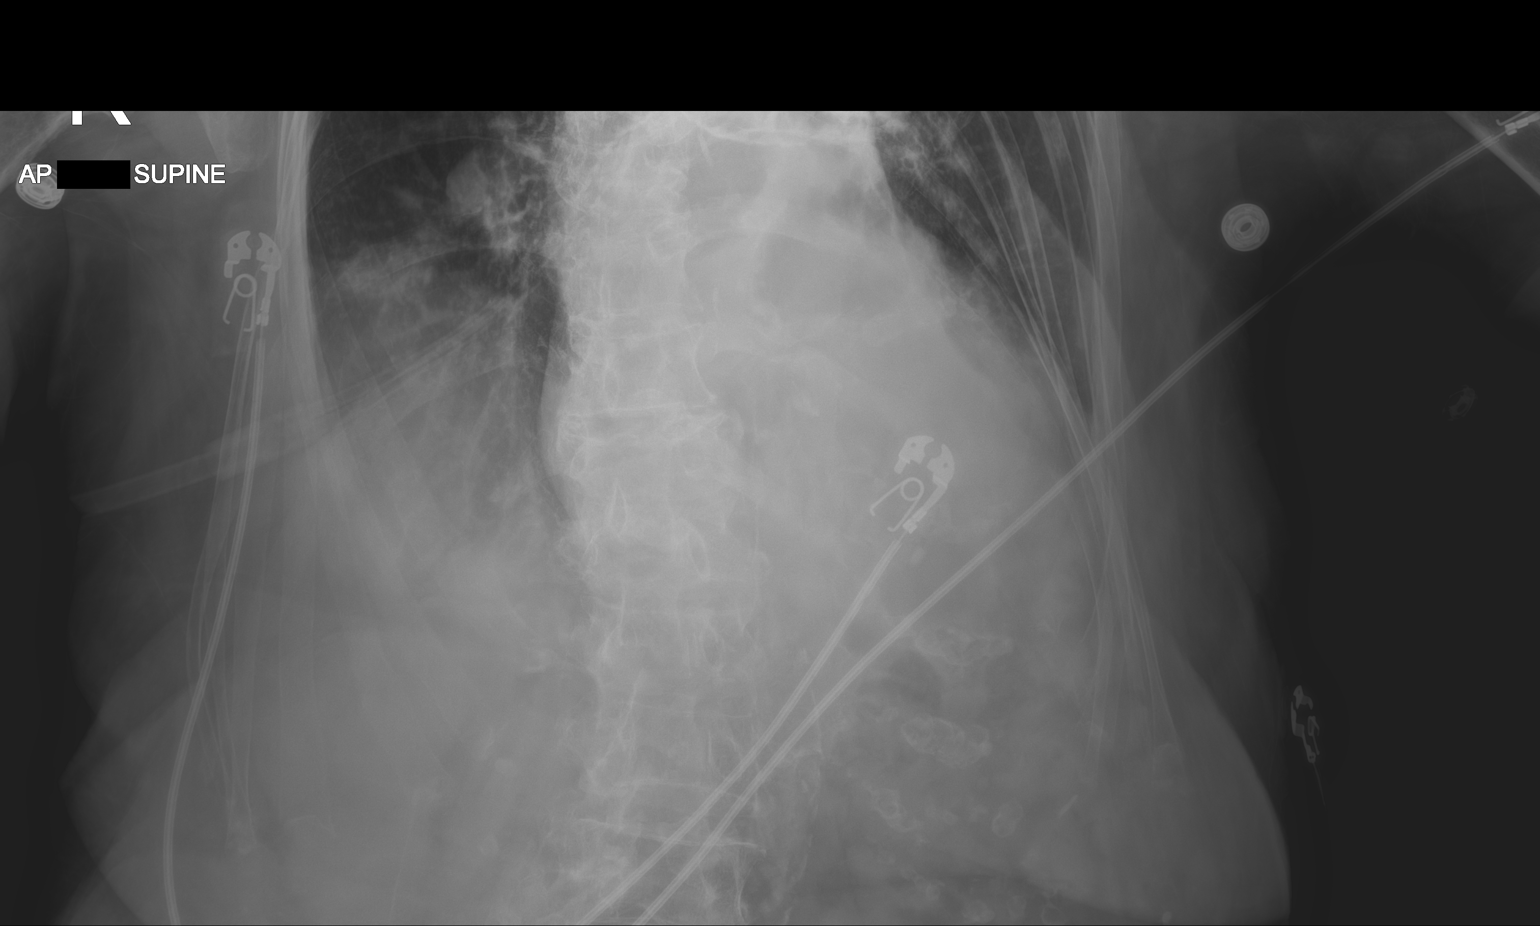

[2 of 2 positions shown; findings below may reference images not displayed]

FINDINGS: Decreased caliber of gas-filled small bowel loops within the LEFT
LOWER abdomen now noted.

No distended bowel loops are present.

Other nondistended gas-filled loops of small bowel noted with gas in
the colon again noted.
IMPRESSION: Decreased caliber of gas-filled small bowel loops in the LEFT LOWER
abdomen. No dilated small bowel loops now identified.

## 2024-03-06 ENCOUNTER — Encounter (INDEPENDENT_AMBULATORY_CARE_PROVIDER_SITE_OTHER): Payer: Self-pay | Admitting: Otolaryngology

## 2024-03-10 ENCOUNTER — Telehealth (INDEPENDENT_AMBULATORY_CARE_PROVIDER_SITE_OTHER): Payer: Self-pay | Admitting: Otolaryngology

## 2024-03-10 NOTE — Telephone Encounter (Signed)
 LVM for patient to call back so that we can coordinate the two appointments scheduled for November to be on the same day.

## 2024-04-06 ENCOUNTER — Ambulatory Visit (INDEPENDENT_AMBULATORY_CARE_PROVIDER_SITE_OTHER): Admitting: Audiology

## 2024-04-06 DIAGNOSIS — H903 Sensorineural hearing loss, bilateral: Secondary | ICD-10-CM

## 2024-04-06 NOTE — Progress Notes (Signed)
  [address], [address] [place], [place] [address] [phone]  Audiological Evaluation    Name: Tammy Walker     DOB:   13-Dec-1930      MRN:   [id number]                                                                                     Service Date: 04/06/2024     Accompanied by: daughter   Patient comes today after Dr. Soldatova, ENT sent a referral for a hearing evaluation due to concerns with hearing loss.   Symptoms Yes Details  Hearing loss  [x]  Patient's family is concerned with her hearing loss. They reports that during the Summer 2025 they noticed she struggled hearing. After that they went to [facility] for hearing aids. They are still concerned about a hearing decline.  Tinnitus  []    Ear pain/ infections/pressure  []    Balance problems  []    Noise exposure history  []    Previous ear surgeries  []    Family history of hearing loss  [x]  Brother - with age   Amplification  [x]  Has a set of hearing aids, fit at [employer]. Had a set of aids from [employer].  Other  []      Otoscopy: Right ear: Clear external ear canal and notable landmarks visualized on the tympanic membrane. Left ear:  Clear external ear canal and notable landmarks visualized on the tympanic membrane.  Tympanometry: Right ear: Type A- Normal external ear canal volume with normal middle ear pressure and tympanic membrane compliance. Left ear: Type A- Normal external ear canal volume with normal middle ear pressure and tympanic membrane compliance.  Pure tone Audiometry: Right ear- Moderate to severe essentially sensorineural hearing loss from 125 Hz - 8000 Hz. There was a slight air-bone gap at 4000 Hz.  Left ear-  Mild to profound sensorineural hearing loss from 125 Hz - 8000 Hz.  Speech Audiometry: Right ear- Speech Reception Threshold (SRT) was obtained at 60 dBHL. Left ear-Speech Reception Threshold (SRT) was obtained at 65 dBHL.   Word Recognition Score Tested using NU-6 (recorded) Right  ear: 60% was obtained at a presentation level of 90 dBHL with contralateral masking which is deemed as  poor. Left ear: 64% was obtained at a presentation level of 90 dBHL with contralateral masking which is deemed as  poor.   The hearing test results were completed under headphones and results are deemed to be of good reliability. Test technique:  conventional    Impression: Today's audiogram is overall stable with minimal changes when compared to her audiogram from 05-04-2023. Pure tone thresholds in the left era continue to be worse than in the right ear.   Recommendations: Follow up with ENT as scheduled for today. Return for a hearing evaluation if concerns with hearing changes arise or per MD recommendation. Recommend hearing aid use. Verify settings with LSM would be recommended.    Tammy Walker, AUD

## 2024-04-08 ENCOUNTER — Encounter: Payer: Self-pay | Admitting: Physical Medicine & Rehabilitation

## 2024-04-08 MED ORDER — OXYCODONE HCL 5 MG PO TABS: 2.5000 mg | ORAL_TABLET | Freq: Two times a day (BID) | ORAL | 0 refills | Status: DC | PRN

## 2024-04-14 ENCOUNTER — Encounter: Payer: Self-pay | Admitting: Physical Medicine & Rehabilitation

## 2024-04-14 ENCOUNTER — Encounter: Attending: Physical Medicine & Rehabilitation | Admitting: Physical Medicine & Rehabilitation

## 2024-04-14 VITALS — BP 154/82 | HR 85 | Ht 60.0 in

## 2024-04-14 DIAGNOSIS — G894 Chronic pain syndrome: Secondary | ICD-10-CM | POA: Insufficient documentation

## 2024-04-14 DIAGNOSIS — M546 Pain in thoracic spine: Secondary | ICD-10-CM | POA: Insufficient documentation

## 2024-04-14 DIAGNOSIS — Z79891 Long term (current) use of opiate analgesic: Secondary | ICD-10-CM | POA: Insufficient documentation

## 2024-04-14 DIAGNOSIS — G8929 Other chronic pain: Secondary | ICD-10-CM | POA: Insufficient documentation

## 2024-04-14 DIAGNOSIS — M40209 Unspecified kyphosis, site unspecified: Secondary | ICD-10-CM | POA: Diagnosis present

## 2024-04-14 DIAGNOSIS — Z5181 Encounter for therapeutic drug level monitoring: Secondary | ICD-10-CM | POA: Diagnosis present

## 2024-04-14 MED ORDER — OXYCODONE HCL ER 10 MG PO T12A: 10.0000 mg | EXTENDED_RELEASE_TABLET | Freq: Two times a day (BID) | ORAL | 0 refills | Status: DC

## 2024-04-14 MED ORDER — OXYCONTIN 10 MG PO T12A: 10.0000 mg | EXTENDED_RELEASE_TABLET | Freq: Two times a day (BID) | ORAL | 0 refills | Status: DC

## 2024-04-14 MED ORDER — OXYCODONE HCL 5 MG PO TABS: 2.5000 mg | ORAL_TABLET | Freq: Two times a day (BID) | ORAL | 0 refills | Status: DC | PRN

## 2024-04-14 NOTE — Progress Notes (Signed)
 Subjective:    Patient ID: Tammy Walker, female    DOB: 1930/08/10, 89 y.o.   MRN: [id number]  HPI  HPI  Tammy Walker is a 89 y.o. year old female  who  has a past medical history of Allergy, Arrhythmia, Atrial fibrillation (HCC), GERD (gastroesophageal reflux disease), History of bone scan, History of chest x-ray, History of CT scan of abdomen (10/24/2017), History of echocardiogram, History of EKG, Hyperlipidemia, Hypertension, Insomnia, Left lower lobe pneumonia (09/20/2015), Nodule of right lung, Osteoporosis, Osteoporosis, and Reduced mobility.   They are presenting to PM&R clinic as a new patient for pain management evaluation. They were referred by Therisa Holm, PA for treatment of chronic pain.  Thank you for the referral of this very pleasant patient.    Patient is here with her daughter.  Patient is a little hard of hearing.  She reports her back pain began around 2014 when she was getting ready for a trip to [place]. Reviewed x-ray results from T-spine x-ray 07/08/2012 which showed compression fractures T9 and L2 and thoracic kyphosis.  Patient reports she was in [place] and went to the hospital and was treated with morphine  that did not help but had different medication and was beneficial. Since this time she has had worsening pain in her thoracic spine and she also has pain in her sternum.  Pain is worsened with standing bending and reaching.  Pain wakes her up in the morning.  Ice and heat to help her pain.    Patient and daughter report she was originally taking the extended release 5 mg oxycodone  in [place] but this was not available in the [place] .  She was tried on 10 mg extended release hydrocodone  and then this was increased to 15 mg extended release hydrocodone .  She had difficulty getting this medication from the pharmacy and thus was started on Norco 10 3 times a day to achieve a similar dose.  She felt like she was always playing catch up with her pain on the IR hydrocodone  and  was changed over recently to oxycodone  ER 10 mg.  She feels like this medicine is doing about the same as the prior hydrocodone  ER.  She also takes Lyrica , hard to tell how much it is helping but she did notice a benefit when it was last increased.  She does have occasional issues with constipation, and is cautious to increase medication dose further.   Red flag symptoms: No red flags for back pain endorsed in Hx or ROS  Medications tried: Topical medications Voltaren  gel helps  Nsaids - blood thinners Limited Tylenol   - she takes three to four times a day with some benefit   Opiates  Hydrocodone  IR/hydrocodone  ER previously with benefit.  Hydrocodone  ER was hard to get from the pharmacy Now on oxycodone  ER  Lyrica  75mg  TID with suspected benefit TCAs  - Denies  SNRIs - Denies    Other treatments: PT- helped get her moving, unsure if it really helped with pain Chiropractor- didn't help TENs unit - Denies  Injections- denies Surgery denies  Other - Ice and heat help     Prior UDS results: No results found for: LABOPIA, COCAINSCRNUR, LABBENZ, AMPHETMU, THCU, LABBARB   Interval Hx 02/13/24 Reports ongoing pain management challenges. Wakes around 8 AM, takes until 9:30 AM to dress and have breakfast due to stiffness. Experiences bone pain in the morning. Developed stabbing pains from left side, described as little darts. Pain in sternum occurs after getting  up, sometimes present upon waking. Pain worsens when sitting after lying down. Sleeps well..  Currently taking OxyContin  10mg  medication twice daily (20mg  total). Uses liquid oxycodone  2.5ml (half teaspoon) for breakthrough pain - last used yesterday when missed morning medications. Prescription expired, needs refill.  This was last filled over a year ago.  Uses hot water bottle for back pain relief. Manages constipation with laxatives and having regular bowel movements.  Interval Hx 04/14/24 Reports ongoing pain,  primarily in the morning, rated 7-8/10. Pain can take 2.5-3 hours to become comfortable after waking. Taking oxycontin  medication at 8 AM, relief may not occur until 10:30 or 11:00 AM. Describes a constricting, band-like pain around the ribs, like a bagging strap. Also reports intermittent sharp, stabbing, darting pain toward the back of the rib cage, which is transient. No new areas of pain. Reports significant relief from breakthrough pain with oxycodone  2.5mg  (half a pill). Using it once daily, but not every day. No reported cravings for opioids. Expresses reluctance to take pain medication unless absolutely necessary. Reports staying still or curling up due to severe pain, but knows that walking may help, though the pain is too severe to contemplate moving.  Medications - OxyContin  10mg  every 12 hours. - Oxycodone  IR 5mg  tablets: taking 2.5mg  (half a tablet) PRN for breakthrough pain, typically in the morning. Has been effective. - Pregabalin  75mg  once daily and 25mg  twice daily. - Tylenol : taking two tablets in the morning for the last 3-4 weeks, increased from one tablet.  Objective Urine drug screen collected in clinic for routine monitoring, done approximately twice per year.   Pain Inventory Average Pain 5 Pain Right Now 2 My pain is intermittent, constant, sharp, stabbing, and aching  In the last 24 hours, has pain interfered with the following? General activity 5 Relation with others 0 Enjoyment of life 2 What TIME of day is your pain at its worst? morning  Sleep (in general) Good  Pain is worse with: walking, standing, and some activites Pain improves with: rest and medication Relief from Meds: 7      Family History  Problem Relation Age of Onset   Heart disease Mother    High blood pressure Mother    COPD Mother    Heart failure Mother    Heart disease Father    Heart disease Brother    Heart failure Brother    Hyperlipidemia Daughter    Hypertension Daughter     Social History   Socioeconomic History   Marital status: Widowed    Spouse name: Not on file   Number of children: Not on file   Years of education: Not on file   Highest education level: Not on file  Occupational History   Not on file  Tobacco Use   Smoking status: Never   Smokeless tobacco: Never   Tobacco comments:    Never smoked 05/21/23  Vaping Use   Vaping status: Never Used  Substance and Sexual Activity   Alcohol use: Not Currently    Comment: less than one drink a week   Drug use: No   Sexual activity: Never    Birth control/protection: None  Other Topics Concern   Not on file  Social History Narrative   Social History      Diet? I watch my intake of salt, sugar, and fat      Do you drink/eat things with caffeine? Yes       Marital status?      Widowed  What year were you married? 1960      Do you live in a house, apartment, assisted living, condo, trailer, etc.? House       Is it one or more stories? one      How many persons live in your home? 2      Do you have any pets in your home? (please list) yes 2 cats       Highest level of education completed? 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             yes                         Type & how often? Strength & Balance       Advanced Directives      Do you have a living will?  No       Do you have a DNR form?        No                           If not, do you want to discuss one? No       Do you have signed POA/HPOA for forms? Yes       Functional Status      Do you have difficulty bathing or dressing yourself? No       Do you have difficulty preparing food or eating? No       Do you have difficulty managing your medications? No       Do you have difficulty managing your finances? No       Do you have difficulty affording your medications? No       Social Drivers of  Corporate investment banker Strain: Not on file  Food Insecurity: Not on file  Transportation Needs: Not on file  Physical Activity: Not on file  Stress: Not on file  Social Connections: Not on file   Past Surgical History:  Procedure Laterality Date   APPENDECTOMY  1947   [place], [place]    CARDIOVERSION N/A 10/29/2022   Procedure: CARDIOVERSION;  Surgeon: Francyne Headland, MD;  [facility];  Service: Cardiovascular;  Laterality: N/A;   CARDIOVERSION N/A 12/01/2022   Procedure: CARDIOVERSION;  Surgeon: Santo Stanly LABOR, MD;  Location: [facility];  Service: Cardiovascular;  Laterality: N/A;   CT SCAN     2017, 2018, 2019 -- re lungs see records    TUBAL LIGATION  1978   [facility] [place], [place]    Past Medical History:  Diagnosis Date   Allergy    Arrhythmia    Atrial fibrillation (HCC)    GERD (gastroesophageal reflux disease)    History of bone scan    Bone Density Scans 2017 & 2019 Dr. Malcolm    History of chest x-ray    Apart from large hiatus hernia, normal heart, lungs and mediastinum. Per records from [facility]     History of CT scan of abdomen 10/24/2017   Gallstones within a thin-waled gallbladder, Per records from [facility]    [facility] echocardiogram    [facility]    History of EKG    Sinus rhythm, borderline 1st deg  block, LAD completely changed axis, LBBB(old). Per records from [facility]    Hyperlipidemia    Hypertension    Insomnia    Left lower lobe pneumonia 09/20/2015   Per records from [facility]    Nodule of right lung    Per records from [facility],    Osteoporosis    on fosamax   Osteoporosis    Reduced mobility    There were no vitals taken for this visit.  Opioid Risk Score:   Fall Risk Score:  `1  Depression screen [facility] 2/9     02/12/2024    3:19 PM 11/26/2023    2:39 PM 08/22/2019   10:45 AM  09/19/2016    3:22 PM 07/26/2015   10:29 AM  Depression screen PHQ 2/9  Decreased Interest 0 0 0 0 0  Down, Depressed, Hopeless 0 0 0 0 0  PHQ - 2 Score 0 0 0 0 0  Altered sleeping  0     Tired, decreased energy  2     Change in appetite  0     Feeling bad or failure about yourself   0     Trouble concentrating  0     Moving slowly or fidgety/restless  0     Suicidal thoughts  0     PHQ-9 Score  2       Review of Systems  Musculoskeletal:  Positive for back pain, gait problem and neck pain.       Left Sternum pain, pain in the left rib  All other systems reviewed and are negative.      Objective:   Physical Exam  Gen: no distress, normal appearing, daughter present for visit also HEENT: oral mucosa pink and moist, NCAT Chest: normal effort, normal rate of breathing Abd: soft, non-distended Ext: no edema Psych: Very pleasant Skin: intact Neuro: Alert and awake, follows commands, cranial nerves II through XII grossly intact although hard of hearing, normal speech and language Moving all 4 extremities to gravity Sensory exam normal for light touch and pain in all 4 limbs. No abnormal tone noted Musculoskeletal:  Severe kyphosis and thoracic spine and forward neck posture Mild tenderness lower thoracic spine   T-spine x-ray 02/13/2024 FINDINGS: Vertebral plana is noted at T12 with significant increased kyphosis. Mild osteophytic changes are seen. Mild T9 compression deformity is noted slightly progressed when compared with the prior exam. No paraspinal mass is noted. Hiatal hernia is noted.   IMPRESSION: T12 vertebral plana new from the prior exam.   Slight progression of T9 compression fracture.      Assessment & Plan:   Chronic thoracic, sternal and neck pain likely related to thoracic kyphosis and compression fractures -TENS unit, Zynex Nexwave noted to be expensive.  Advised trying over-the-counter version.  She reports cardiologist was okay with her using TENS  unit. -Dicussed option for shockwave therapy- pt to consider - Will continue on this medication OxyContin  10 mg every 12 hours    -Patient is concerned about increased constipation if opioids are increased, this is a possibility although could add additional laxatives.  Appears she is managing her bowels fairly well --Continue UDS and pill counts.  Continue PDMP monitoring.  Pain contract completed prior visit. -Discussed bringing pill bottle with any medications even if empty to all appointments -Continue oxycodone  2.5mg  (1/2 tab 5mg ) BID PRN for breakthrough pain #30

## 2024-04-16 ENCOUNTER — Encounter: Payer: Self-pay | Admitting: Physical Medicine & Rehabilitation

## 2024-04-19 LAB — DRUG TOX MONITOR 1 W/CONF, ORAL FLD
Amphetamines: NEGATIVE ng/mL (ref <10)
Barbiturates: NEGATIVE ng/mL (ref <10)
Benzodiazepines: NEGATIVE ng/mL (ref <0.50)
Buprenorphine: NEGATIVE ng/mL (ref <0.10)
Cocaine: NEGATIVE ng/mL (ref <5.0)
Codeine: NEGATIVE ng/mL (ref <2.5)
Dihydrocodeine: NEGATIVE ng/mL (ref <2.5)
Fentanyl: NEGATIVE ng/mL (ref <0.10)
Heroin Metabolite: NEGATIVE ng/mL (ref <1.0)
Hydrocodone: NEGATIVE ng/mL (ref <2.5)
Hydromorphone: NEGATIVE ng/mL (ref <2.5)
MARIJUANA: NEGATIVE ng/mL (ref <2.5)
MDMA: NEGATIVE ng/mL (ref <10)
Meprobamate: NEGATIVE ng/mL (ref <2.5)
Methadone: NEGATIVE ng/mL (ref <5.0)
Morphine: NEGATIVE ng/mL (ref <2.5)
Nicotine Metabolite: NEGATIVE ng/mL (ref <5.0)
Norhydrocodone: NEGATIVE ng/mL (ref <2.5)
Noroxycodone: 8.1 ng/mL — ABNORMAL HIGH (ref <2.5)
Opiates: POSITIVE ng/mL — AB (ref <2.5)
Oxycodone: 48.4 ng/mL — ABNORMAL HIGH (ref <2.5)
Oxymorphone: NEGATIVE ng/mL (ref <2.5)
Phencyclidine: NEGATIVE ng/mL (ref <10)
Tapentadol: NEGATIVE ng/mL (ref <5.0)
Tramadol: NEGATIVE ng/mL (ref <5.0)
Zolpidem: NEGATIVE ng/mL (ref <5.0)

## 2024-04-19 LAB — DRUG TOX ALC METAB W/CON, ORAL FLD: Alcohol Metabolite: NEGATIVE ng/mL (ref <25)

## 2024-04-19 MED ORDER — PREGABALIN 75 MG PO CAPS: 75.0000 mg | ORAL_CAPSULE | Freq: Every day | ORAL | 2 refills | Status: AC

## 2024-04-19 MED ORDER — PREGABALIN 25 MG PO CAPS
25.0000 mg | ORAL_CAPSULE | ORAL | 2 refills | Status: AC
Start: 2024-04-19 — End: ?

## 2024-04-20 ENCOUNTER — Telehealth (INDEPENDENT_AMBULATORY_CARE_PROVIDER_SITE_OTHER): Payer: Self-pay | Admitting: Physician Assistant

## 2024-04-20 NOTE — Telephone Encounter (Signed)
 Left vm to r/s 05/06/24 appointment.

## 2024-04-29 ENCOUNTER — Other Ambulatory Visit (INDEPENDENT_AMBULATORY_CARE_PROVIDER_SITE_OTHER): Payer: Self-pay | Admitting: Otolaryngology

## 2024-04-30 DEATH — deceased

## 2024-05-06 ENCOUNTER — Ambulatory Visit (INDEPENDENT_AMBULATORY_CARE_PROVIDER_SITE_OTHER): Admitting: Otolaryngology

## 2024-05-06 ENCOUNTER — Telehealth: Payer: Self-pay | Admitting: Cardiology

## 2024-05-06 ENCOUNTER — Institutional Professional Consult (permissible substitution) (INDEPENDENT_AMBULATORY_CARE_PROVIDER_SITE_OTHER): Payer: Medicare Other | Admitting: Audiology

## 2024-05-06 ENCOUNTER — Ambulatory Visit (INDEPENDENT_AMBULATORY_CARE_PROVIDER_SITE_OTHER): Admitting: Audiology

## 2024-05-06 NOTE — Telephone Encounter (Signed)
 Pt c/o medication issue:  1. Name of Medication:   Orajel with Benzocaine  2. How are you currently taking this medication (dosage and times per day)?   Not started yet  3. Are you having a reaction (difficulty breathing--STAT)?   4. What is your medication issue?   Daughter Mateo) noted patient has a bad toothache and wants to know if patient can use this medication.  Daughter stated can leave a detailed voice message also.

## 2024-05-13 ENCOUNTER — Ambulatory Visit (INDEPENDENT_AMBULATORY_CARE_PROVIDER_SITE_OTHER): Payer: Medicare Other | Admitting: Otolaryngology

## 2024-05-16 ENCOUNTER — Encounter (INDEPENDENT_AMBULATORY_CARE_PROVIDER_SITE_OTHER): Payer: Self-pay

## 2024-05-16 ENCOUNTER — Ambulatory Visit (INDEPENDENT_AMBULATORY_CARE_PROVIDER_SITE_OTHER)

## 2024-05-16 VITALS — BP 115/79 | HR 112

## 2024-05-16 DIAGNOSIS — H903 Sensorineural hearing loss, bilateral: Secondary | ICD-10-CM | POA: Diagnosis not present

## 2024-05-16 DIAGNOSIS — H6123 Impacted cerumen, bilateral: Secondary | ICD-10-CM

## 2024-05-16 DIAGNOSIS — R0982 Postnasal drip: Secondary | ICD-10-CM

## 2024-05-16 MED ORDER — IPRATROPIUM BROMIDE 0.03 % NA SOLN: 2.0000 | Freq: Two times a day (BID) | NASAL | 12 refills | Status: AC

## 2024-05-16 NOTE — Progress Notes (Signed)
 Dear Dr. Alben, Here is my assessment for our mutual patient, Tammy Walker. Thank you for allowing me the opportunity to care for your patient. Please do not hesitate to contact me should you have any other questions. Sincerely, Dr. Hadassah Parody  Otolaryngology Clinic Note Referring provider: Dr. Alben HPI:   (05/16/2024) [name].   Here for f/u with audiogram.  Happy with her hearing aids and has not noticed any changes in hearing herself.   Continues to use atrovent  and says it helps her immensely with post-nasal drip and mucous in back of her throat  Using debrox in ear to help with cerumen buildup with hearing aids  No major concerns today.     Independent Review of Additional Tests or Records:  04/06/24 Audiogram was independently reviewed and interpreted by me and it reveals Right ear: moderate to profound SNHL; 60% word interpretation at 90dB; type A tympanogram Left ear: moderate to profound SNHL; 64% word interpretation at 90dB; type A tympanogram    05/04/23 Audiogram was independently reviewed and interpreted by me and it reveals Right ear: moderate to profound SNHL; 70% word interpretation at 90dB; type A tympanogram Left ear: moderate to profound SNHL; 64% word interpretation at 90dB; type A tympanogram    Note by Elena Okey 05/22/23 reviewed: long standing SNHL not interested in CI, atroven nasal spray for nasal congestion everty 12 hours   PMH/Meds/All/SocHx/FamHx/ROS:   Past Medical History:  Diagnosis Date   Allergy    Arrhythmia    Atrial fibrillation (HCC)    GERD (gastroesophageal reflux disease)    History of bone scan    Bone Density Scans 2017 & 2019 Dr. Malcolm    History of chest x-ray    Apart from large hiatus hernia, normal heart, lungs and mediastinum. Per records from [facility]     History of CT scan of abdomen 10/24/2017   Gallstones within a thin-waled gallbladder, Per records from  [facility]    [facility] echocardiogram    [facility]    History of EKG    Sinus rhythm, borderline 1st deg block, LAD completely changed axis, LBBB(old). Per records from [facility]    Hyperlipidemia    Hypertension    Insomnia    Left lower lobe pneumonia 09/20/2015   Per records from [facility]    Nodule of right lung    Per records from [facility],    Osteoporosis    on fosamax   Osteoporosis    Reduced mobility      Past Surgical History:  Procedure Laterality Date   APPENDECTOMY  [address] [place]    CARDIOVERSION N/A 10/29/2022   Procedure: CARDIOVERSION;  Surgeon: Francyne Headland, MD;  [facility];  Service: Cardiovascular;  Laterality: N/A;   CARDIOVERSION N/A 12/01/2022   Procedure: CARDIOVERSION;  Surgeon: Santo Stanly LABOR, MD;  [facility];  Service: Cardiovascular;  Laterality: N/A;   CT SCAN     2017, 2018, 2019 -- re lungs see records    TUBAL LIGATION  1978   [facility] [place], [place]     Family History  Problem Relation Age of Onset   Heart disease Mother    High blood pressure Mother    COPD Mother    Heart failure Mother    Heart disease Father    Heart disease Brother    Heart failure  Brother    Hyperlipidemia Daughter    Hypertension Daughter      Social Connections: Not on file     Current Outpatient Medications  Medication Instructions   acetaminophen  (TYLENOL ) 500 MG tablet 2 tablets in the AM, 1 tablet in the afternoon, and 1 tablet at night Orally three times a day   amiodarone  (PACERONE ) 100 mg, Oral, Daily   atorvastatin  (LIPITOR) 20 MG tablet TAKE 1 TABLET(20 MG) BY MOUTH AT BEDTIME   bisacodyl  (DULCOLAX) 10 mg, Daily at bedtime   Ca Phosphate-Cholecalciferol  (CALCIUM  WITH D3) 250-12.5 MG-MCG CHEW 2 tablets, 2 times daily   carboxymethylcellulose (REFRESH TEARS) 0.5 % SOLN 1 drop, 3 times daily PRN    cetirizine (ZYRTEC) 10 mg, Every morning   clotrimazole (LOTRIMIN) 1 % cream 1 application , 2 times daily PRN   denosumab  (PROLIA ) 60 mg, Every 6 months   diclofenac  Sodium (VOLTAREN ) 4 g, 4 times daily PRN   edoxaban  (SAVAYSA ) 30 mg, Oral, Every 24 hours   hydrocortisone  (ANUSOL -HC) 2.5 % rectal cream 1 Application, Daily PRN   Hydrocortisone  (PREPARATION H EX) 1 Application, Daily PRN   ipratropium (ATROVENT ) 0.03 % nasal spray 2 sprays, Each Nare, Every 12 hours   lidocaine  4 % 1 patch, Transdermal, Every 24 hours   Linzess 72 mcg, Daily PRN   Multiple Vitamins-Minerals (MULTIVITAMIN ADULTS PO) Calcium  250 mg and vitamin D - 500 Units- taking 3 gummies by mouth daily   Multiple Vitamins-Minerals (PRESERVISION AREDS 2 PO) 1 capsule, 2 times daily   ondansetron  (ZOFRAN ) 4 mg, Oral, Every 8 hours PRN   oxyCODONE  (OXY IR/ROXICODONE ) 2.5 mg, Oral, Every 12 hours PRN   oxyCODONE  (OXYCONTIN ) 10 mg, Oral, Every 12 hours   OxyCONTIN  10 mg, Oral, Every 12 hours   Polyethyl Glycol-Propyl Glycol (SYSTANE) 0.4-0.3 % GEL ophthalmic gel 1 Application, Nightly   polyethylene glycol (MIRALAX  / GLYCOLAX ) 17 g, 2 times daily   pregabalin  (LYRICA ) 75 mg, Oral, Daily   pregabalin  (LYRICA ) 25 mg, Oral, See admin instructions, Taking 75 mg by mouth in the morning, 25 mg afternoon and 25 mg in the evening   shark liver oil-cocoa butter (PREPARATION H) 0.25-3-85.5 % suppository 1 suppository, As needed   sodium chloride  (OCEAN) 0.65 % nasal spray as directed Nasally   triamcinolone cream (KENALOG) 0.1 % 1 Application     Physical Exam:   BP 115/79 (BP Location: Right Arm, Patient Position: Sitting)   Pulse (!) 112   SpO2 (!) 89%   Salient findings:  CN II-XII intact Kyphotic  Given history and complaints, ear microscopy was indicated and performed for evaluation with findings as below in physical exam section and in procedures Bilateral EAC clear and TM intact with well pneumatized middle ear  spaces Anterior rhinoscopy: Septum midline anteriorly; bilateral inferior turbinates with mild hypertrophy  No lesions of oral cavity/oropharynx No obviously palpable neck masses/lymphadenopathy/thyromegaly No respiratory distress or stridor  Seprately Identifiable Procedures:  Prior to initiating any procedures, risks/benefits/alternatives were explained to the patient and verbal consent obtained.  Procedure (05/16/2024): Bilateral ear microscopy using microscope (CPT [id number]) Pre-procedure diagnosis: bilateral hearing loss  Post-procedure diagnosis: same Indication: see above; given patient's otologic complaints and history, for improved and comprehensive examination of external ear and tympanic membrane, bilateral otologic examination using microscope was performed. Prior to proceeding, verbal consent was obtained after discussion of R/B/A  Procedure: Patient was placed semi-recumbent. Both ear canals were examined using the microscope with findings above. Patient tolerated the procedure well.  Impression & Plans:  Tammy Walker is a 89 y.o. female with   1. Sensorineural hearing loss, bilateral   2. Post-nasal drip   3. Bilateral impacted cerumen     Bilateral moderate to profound SNHL - Using hearing aids  - Previously discussed CI with Dr. Soldatova and pt not interested - Recommend checking audiogram yearly which pt agrees with  History of bilateral cerumen impactions - OK to continue regimen with debrox. No evidence of OE and canals are clean. If she begins to develop pain or itching should stop the debrox and call   Post-nasal drip Nasal congestion - stable and doing well  - using atrovent  and says it helps immensely. She would like a refill  - Continue zyrtec   See below regarding exact medications prescribed this encounter including dosages and route: Meds ordered this encounter  Medications   ipratropium (ATROVENT ) 0.03 % nasal spray    Sig: Place 2 sprays into  both nostrils every 12 (twelve) hours.    Dispense:  30 mL    Refill:  12    Thank you for allowing me the opportunity to care for your patient. Please do not hesitate to contact me should you have any other questions.  Sincerely, Hadassah Parody, MD Otolaryngologist (ENT), [facility] Phone: [phone] Fax: [phone]  MDM:  Level 4 Complexity/Problems addressed: mod; 2 chronic stable illnesses  Data complexity: mod independent review of audiogram x 2, notes - Morbidity: mod  - Prescription Drug prescribed or managed: y

## 2024-06-02 ENCOUNTER — Encounter: Payer: Self-pay | Admitting: Physical Medicine & Rehabilitation

## 2024-06-02 MED ORDER — OXYCODONE HCL 5 MG PO TABS: 2.5000 mg | ORAL_TABLET | Freq: Two times a day (BID) | ORAL | 0 refills | Status: AC | PRN

## 2024-06-16 ENCOUNTER — Encounter: Admitting: Physical Medicine & Rehabilitation

## 2024-06-20 ENCOUNTER — Encounter: Payer: Self-pay | Admitting: Physical Medicine & Rehabilitation

## 2024-06-20 MED ORDER — OXYCODONE HCL ER 10 MG PO T12A: 10.0000 mg | EXTENDED_RELEASE_TABLET | Freq: Two times a day (BID) | ORAL | 0 refills | Status: DC

## 2024-06-22 ENCOUNTER — Encounter (INDEPENDENT_AMBULATORY_CARE_PROVIDER_SITE_OTHER): Payer: Self-pay

## 2024-07-04 ENCOUNTER — Encounter: Payer: Self-pay | Admitting: Physical Medicine & Rehabilitation

## 2024-07-04 ENCOUNTER — Encounter: Payer: Self-pay | Admitting: Cardiology

## 2024-07-05 ENCOUNTER — Other Ambulatory Visit (HOSPITAL_COMMUNITY): Payer: Self-pay

## 2024-07-05 ENCOUNTER — Telehealth: Payer: Self-pay | Admitting: Pharmacy Technician

## 2024-07-05 NOTE — Telephone Encounter (Signed)
" ° ° °  Ran test claim for savaysa . For a 30 day supply and just too soon until 07/27/24. PA is not needed at this time. This test claim was processed through [facility]- copay amounts may vary at other pharmacies due to pharmacy/plan contracts, or as the patient moves through the different stages of their insurance plan.      "

## 2024-07-11 ENCOUNTER — Inpatient Hospital Stay (HOSPITAL_COMMUNITY)

## 2024-07-11 ENCOUNTER — Inpatient Hospital Stay (HOSPITAL_COMMUNITY)
Admission: EM | Admit: 2024-07-11 | Discharge: 2024-07-13 | DRG: 291 | Disposition: A | Discharge status: Home / Self Care | Attending: Internal Medicine | Admitting: Internal Medicine

## 2024-07-11 ENCOUNTER — Emergency Department (HOSPITAL_COMMUNITY)

## 2024-07-11 ENCOUNTER — Encounter (HOSPITAL_COMMUNITY): Payer: Self-pay

## 2024-07-11 ENCOUNTER — Other Ambulatory Visit: Payer: Self-pay

## 2024-07-11 ENCOUNTER — Ambulatory Visit

## 2024-07-11 DIAGNOSIS — I5020 Unspecified systolic (congestive) heart failure: Secondary | ICD-10-CM | POA: Diagnosis present

## 2024-07-11 DIAGNOSIS — I4891 Unspecified atrial fibrillation: Secondary | ICD-10-CM

## 2024-07-11 DIAGNOSIS — Z8249 Family history of ischemic heart disease and other diseases of the circulatory system: Secondary | ICD-10-CM | POA: Diagnosis not present

## 2024-07-11 DIAGNOSIS — E785 Hyperlipidemia, unspecified: Secondary | ICD-10-CM | POA: Diagnosis present

## 2024-07-11 DIAGNOSIS — I4819 Other persistent atrial fibrillation: Secondary | ICD-10-CM | POA: Diagnosis present

## 2024-07-11 DIAGNOSIS — G4700 Insomnia, unspecified: Secondary | ICD-10-CM | POA: Diagnosis present

## 2024-07-11 DIAGNOSIS — I447 Left bundle-branch block, unspecified: Secondary | ICD-10-CM | POA: Diagnosis present

## 2024-07-11 DIAGNOSIS — R0603 Acute respiratory distress: Secondary | ICD-10-CM | POA: Diagnosis present

## 2024-07-11 DIAGNOSIS — M5440 Lumbago with sciatica, unspecified side: Secondary | ICD-10-CM | POA: Diagnosis not present

## 2024-07-11 DIAGNOSIS — F32A Depression, unspecified: Secondary | ICD-10-CM | POA: Diagnosis present

## 2024-07-11 DIAGNOSIS — I110 Hypertensive heart disease with heart failure: Secondary | ICD-10-CM | POA: Diagnosis not present

## 2024-07-11 DIAGNOSIS — I5043 Acute on chronic combined systolic (congestive) and diastolic (congestive) heart failure: Secondary | ICD-10-CM | POA: Diagnosis present

## 2024-07-11 DIAGNOSIS — I440 Atrioventricular block, first degree: Secondary | ICD-10-CM | POA: Diagnosis present

## 2024-07-11 DIAGNOSIS — Z66 Do not resuscitate: Secondary | ICD-10-CM | POA: Diagnosis present

## 2024-07-11 DIAGNOSIS — I130 Hypertensive heart and chronic kidney disease with heart failure and stage 1 through stage 4 chronic kidney disease, or unspecified chronic kidney disease: Secondary | ICD-10-CM | POA: Diagnosis present

## 2024-07-11 DIAGNOSIS — I509 Heart failure, unspecified: Secondary | ICD-10-CM

## 2024-07-11 DIAGNOSIS — K449 Diaphragmatic hernia without obstruction or gangrene: Secondary | ICD-10-CM | POA: Diagnosis present

## 2024-07-11 DIAGNOSIS — I160 Hypertensive urgency: Secondary | ICD-10-CM | POA: Diagnosis present

## 2024-07-11 DIAGNOSIS — I513 Intracardiac thrombosis, not elsewhere classified: Secondary | ICD-10-CM | POA: Diagnosis present

## 2024-07-11 DIAGNOSIS — I700 Atherosclerosis of aorta: Secondary | ICD-10-CM | POA: Diagnosis present

## 2024-07-11 DIAGNOSIS — I4821 Permanent atrial fibrillation: Secondary | ICD-10-CM | POA: Diagnosis present

## 2024-07-11 DIAGNOSIS — I5033 Acute on chronic diastolic (congestive) heart failure: Secondary | ICD-10-CM | POA: Diagnosis present

## 2024-07-11 DIAGNOSIS — M810 Age-related osteoporosis without current pathological fracture: Secondary | ICD-10-CM | POA: Diagnosis present

## 2024-07-11 DIAGNOSIS — E038 Other specified hypothyroidism: Secondary | ICD-10-CM | POA: Diagnosis present

## 2024-07-11 DIAGNOSIS — H9190 Unspecified hearing loss, unspecified ear: Secondary | ICD-10-CM | POA: Diagnosis present

## 2024-07-11 DIAGNOSIS — R079 Chest pain, unspecified: Secondary | ICD-10-CM

## 2024-07-11 DIAGNOSIS — G8929 Other chronic pain: Secondary | ICD-10-CM | POA: Diagnosis present

## 2024-07-11 DIAGNOSIS — M545 Low back pain, unspecified: Secondary | ICD-10-CM | POA: Diagnosis present

## 2024-07-11 DIAGNOSIS — I340 Nonrheumatic mitral (valve) insufficiency: Secondary | ICD-10-CM | POA: Diagnosis present

## 2024-07-11 DIAGNOSIS — Z7901 Long term (current) use of anticoagulants: Secondary | ICD-10-CM | POA: Diagnosis not present

## 2024-07-11 DIAGNOSIS — Z79899 Other long term (current) drug therapy: Secondary | ICD-10-CM

## 2024-07-11 DIAGNOSIS — Z825 Family history of asthma and other chronic lower respiratory diseases: Secondary | ICD-10-CM

## 2024-07-11 DIAGNOSIS — N182 Chronic kidney disease, stage 2 (mild): Secondary | ICD-10-CM | POA: Diagnosis present

## 2024-07-11 DIAGNOSIS — K219 Gastro-esophageal reflux disease without esophagitis: Secondary | ICD-10-CM | POA: Diagnosis present

## 2024-07-11 LAB — CBC WITH DIFFERENTIAL/PLATELET
Abs Immature Granulocytes: 0.03 K/uL (ref 0.00–0.07)
Basophils Absolute: 0.1 K/uL (ref 0.0–0.1)
Basophils Relative: 1 %
Eosinophils Absolute: 0.1 K/uL (ref 0.0–0.5)
Eosinophils Relative: 1 %
HCT: 43.5 % (ref 36.0–46.0)
Hemoglobin: 13.9 g/dL (ref 12.0–15.0)
Immature Granulocytes: 1 %
Lymphocytes Relative: 48 %
Lymphs Abs: 3.2 K/uL (ref 0.7–4.0)
MCH: 30.9 pg (ref 26.0–34.0)
MCHC: 32 g/dL (ref 30.0–36.0)
MCV: 96.7 fL (ref 80.0–100.0)
Monocytes Absolute: 0.5 K/uL (ref 0.1–1.0)
Monocytes Relative: 7 %
Neutro Abs: 2.8 K/uL (ref 1.7–7.7)
Neutrophils Relative %: 42 %
Platelets: 273 K/uL (ref 150–400)
RBC: 4.5 MIL/uL (ref 3.87–5.11)
RDW: 14.6 % (ref 11.5–15.5)
WBC: 6.7 K/uL (ref 4.0–10.5)
nRBC: 0 % (ref 0.0–0.2)

## 2024-07-11 LAB — ECHOCARDIOGRAM COMPLETE
Area-P 1/2: 4.77 cm2
Calc EF: 42.2 %
S' Lateral: 2.6 cm
Single Plane A2C EF: 36.4 %
Single Plane A4C EF: 45 %
Weight: 1840 [oz_av]

## 2024-07-11 LAB — COMPREHENSIVE METABOLIC PANEL WITH GFR
ALT: 18 U/L (ref 0–44)
AST: 37 U/L (ref 15–41)
Albumin: 4.3 g/dL (ref 3.5–5.0)
Alkaline Phosphatase: 67 U/L (ref 38–126)
Anion gap: 11 (ref 5–15)
BUN: 16 mg/dL (ref 8–23)
CO2: 27 mmol/L (ref 22–32)
Calcium: 9.3 mg/dL (ref 8.9–10.3)
Chloride: 98 mmol/L (ref 98–111)
Creatinine, Ser: 0.79 mg/dL (ref 0.44–1.00)
GFR, Estimated: >60 mL/min
Glucose, Bld: 156 mg/dL — ABNORMAL HIGH (ref 70–99)
Potassium: 4.2 mmol/L (ref 3.5–5.1)
Sodium: 136 mmol/L (ref 135–145)
Total Bilirubin: 0.7 mg/dL (ref 0.0–1.2)
Total Protein: 7.3 g/dL (ref 6.5–8.1)

## 2024-07-11 LAB — TROPONIN T, HIGH SENSITIVITY
Troponin T High Sensitivity: <15 ng/L (ref 0–19)
Troponin T High Sensitivity: <15 ng/L (ref 0–19)

## 2024-07-11 LAB — TSH: TSH: 4.57 u[IU]/mL — ABNORMAL HIGH (ref 0.350–4.500)

## 2024-07-11 LAB — T4, FREE: Free T4: 1.48 ng/dL (ref 0.80–2.00)

## 2024-07-11 LAB — PRO BRAIN NATRIURETIC PEPTIDE: Pro Brain Natriuretic Peptide: 1459 pg/mL — ABNORMAL HIGH

## 2024-07-11 LAB — MAGNESIUM: Magnesium: 2.2 mg/dL (ref 1.7–2.4)

## 2024-07-11 MED ORDER — MORPHINE SULFATE (PF) 2 MG/ML IV SOLN
2.0000 mg | Freq: Once | INTRAVENOUS | Status: AC
  Administered 2024-07-11: 2 mg via INTRAVENOUS
  Filled 2024-07-11: qty 1

## 2024-07-11 MED ORDER — OXYCODONE HCL 5 MG PO TABS: 2.5000 mg | ORAL_TABLET | Freq: Two times a day (BID) | ORAL | Status: DC | PRN

## 2024-07-11 MED ORDER — LIDOCAINE 5 % EX PTCH
1.0000 | MEDICATED_PATCH | CUTANEOUS | Status: DC
  Administered 2024-07-11 – 2024-07-13 (×3): 1 via TRANSDERMAL
  Filled 2024-07-11 (×3): qty 1

## 2024-07-11 MED ORDER — LORATADINE 10 MG PO TABS
10.0000 mg | ORAL_TABLET | Freq: Every day | ORAL | Status: DC
  Administered 2024-07-11 – 2024-07-13 (×3): 10 mg via ORAL
  Filled 2024-07-11 (×3): qty 1

## 2024-07-11 MED ORDER — FUROSEMIDE 10 MG/ML IJ SOLN
40.0000 mg | Freq: Once | INTRAMUSCULAR | Status: AC
  Administered 2024-07-11: 40 mg via INTRAVENOUS
  Filled 2024-07-11: qty 4

## 2024-07-11 MED ORDER — ACETAMINOPHEN 500 MG PO TABS: 500.0000 mg | ORAL_TABLET | ORAL | Status: DC

## 2024-07-11 MED ORDER — AMIODARONE LOAD VIA INFUSION
150.0000 mg | Freq: Once | INTRAVENOUS | Status: AC
  Administered 2024-07-11: 150 mg via INTRAVENOUS
  Filled 2024-07-11: qty 83.34

## 2024-07-11 MED ORDER — ACETAMINOPHEN 325 MG PO TABS
650.0000 mg | ORAL_TABLET | Freq: Four times a day (QID) | ORAL | Status: DC | PRN
  Administered 2024-07-12: 650 mg via ORAL
  Filled 2024-07-11: qty 2

## 2024-07-11 MED ORDER — ACETAMINOPHEN 500 MG PO TABS
500.0000 mg | ORAL_TABLET | Freq: Every day | ORAL | Status: DC
  Administered 2024-07-11 – 2024-07-12 (×2): 500 mg via ORAL
  Filled 2024-07-11 (×2): qty 1

## 2024-07-11 MED ORDER — HYDRALAZINE HCL 20 MG/ML IJ SOLN: 10.0000 mg | INTRAMUSCULAR | Status: DC | PRN

## 2024-07-11 MED ORDER — FUROSEMIDE 10 MG/ML IJ SOLN: 40.0000 mg | Freq: Two times a day (BID) | INTRAMUSCULAR | Status: DC

## 2024-07-11 MED ORDER — AMIODARONE HCL 200 MG PO TABS
100.0000 mg | ORAL_TABLET | Freq: Every day | ORAL | Status: DC
  Administered 2024-07-11: 100 mg via ORAL
  Filled 2024-07-11: qty 1

## 2024-07-11 MED ORDER — NITROGLYCERIN 0.4 MG SL SUBL
0.4000 mg | SUBLINGUAL_TABLET | SUBLINGUAL | Status: DC | PRN
  Administered 2024-07-11 (×3): 0.4 mg via SUBLINGUAL
  Filled 2024-07-11: qty 1

## 2024-07-11 MED ORDER — MELATONIN 3 MG PO TABS: 3.0000 mg | ORAL_TABLET | Freq: Every evening | ORAL | Status: DC | PRN

## 2024-07-11 MED ORDER — PREGABALIN 75 MG PO CAPS
75.0000 mg | ORAL_CAPSULE | Freq: Every day | ORAL | Status: DC
  Administered 2024-07-11 – 2024-07-13 (×3): 75 mg via ORAL
  Filled 2024-07-11 (×2): qty 1
  Filled 2024-07-11: qty 3

## 2024-07-11 MED ORDER — PREGABALIN 25 MG PO CAPS
25.0000 mg | ORAL_CAPSULE | Freq: Two times a day (BID) | ORAL | Status: DC
  Administered 2024-07-11 – 2024-07-12 (×2): 25 mg via ORAL
  Filled 2024-07-11 (×3): qty 1

## 2024-07-11 MED ORDER — ATORVASTATIN CALCIUM 10 MG PO TABS
20.0000 mg | ORAL_TABLET | Freq: Every day | ORAL | Status: DC
  Administered 2024-07-11 – 2024-07-12 (×2): 20 mg via ORAL
  Filled 2024-07-11 (×2): qty 2

## 2024-07-11 MED ORDER — SODIUM CHLORIDE 0.9% FLUSH: 3.0000 mL | INTRAVENOUS | Status: DC | PRN

## 2024-07-11 MED ORDER — SODIUM CHLORIDE 0.9% FLUSH
3.0000 mL | Freq: Two times a day (BID) | INTRAVENOUS | Status: DC
  Administered 2024-07-11 – 2024-07-13 (×5): 3 mL via INTRAVENOUS

## 2024-07-11 MED ORDER — DICLOFENAC SODIUM 1 % EX GEL: 4.0000 g | Freq: Four times a day (QID) | CUTANEOUS | Status: DC | PRN

## 2024-07-11 MED ORDER — ONDANSETRON HCL 4 MG/2ML IJ SOLN
4.0000 mg | Freq: Once | INTRAMUSCULAR | Status: AC
  Administered 2024-07-11: 4 mg via INTRAVENOUS
  Filled 2024-07-11: qty 2

## 2024-07-11 MED ORDER — POLYETHYLENE GLYCOL 3350 17 G PO PACK
17.0000 g | PACK | Freq: Two times a day (BID) | ORAL | Status: DC
  Administered 2024-07-11 (×2): 17 g via ORAL
  Filled 2024-07-11 (×3): qty 1

## 2024-07-11 MED ORDER — EDOXABAN TOSYLATE 30 MG PO TABS
30.0000 mg | ORAL_TABLET | ORAL | Status: DC
  Administered 2024-07-12 – 2024-07-13 (×2): 30 mg via ORAL
  Filled 2024-07-11 (×3): qty 1

## 2024-07-11 MED ORDER — CARBOXYMETHYLCELLULOSE SODIUM 0.5 % OP SOLN: 1.0000 [drp] | Freq: Three times a day (TID) | OPHTHALMIC | Status: DC | PRN

## 2024-07-11 MED ORDER — AMIODARONE HCL IN DEXTROSE 360-4.14 MG/200ML-% IV SOLN
30.0000 mg/h | INTRAVENOUS | Status: DC
  Administered 2024-07-11 – 2024-07-13 (×3): 30 mg/h via INTRAVENOUS
  Filled 2024-07-11 (×3): qty 200

## 2024-07-11 MED ORDER — BISACODYL 5 MG PO TBEC
10.0000 mg | DELAYED_RELEASE_TABLET | Freq: Every day | ORAL | Status: DC
  Administered 2024-07-11 – 2024-07-12 (×2): 10 mg via ORAL
  Filled 2024-07-11 (×2): qty 2

## 2024-07-11 MED ORDER — ONDANSETRON HCL 4 MG/2ML IJ SOLN: 4.0000 mg | Freq: Four times a day (QID) | INTRAMUSCULAR | Status: DC | PRN

## 2024-07-11 MED ORDER — OXYCODONE HCL ER 10 MG PO T12A
10.0000 mg | EXTENDED_RELEASE_TABLET | Freq: Two times a day (BID) | ORAL | Status: DC
  Administered 2024-07-11 – 2024-07-13 (×5): 10 mg via ORAL
  Filled 2024-07-11 (×5): qty 1

## 2024-07-11 MED ORDER — ACETAMINOPHEN 650 MG RE SUPP: 650.0000 mg | Freq: Four times a day (QID) | RECTAL | Status: DC | PRN

## 2024-07-11 MED ORDER — AMIODARONE HCL IN DEXTROSE 360-4.14 MG/200ML-% IV SOLN
60.0000 mg/h | INTRAVENOUS | Status: AC
  Administered 2024-07-11 (×2): 60 mg/h via INTRAVENOUS
  Filled 2024-07-11 (×2): qty 200

## 2024-07-11 MED ORDER — SODIUM CHLORIDE 0.9 % IV SOLN: 250.0000 mL | INTRAVENOUS | Status: AC | PRN

## 2024-07-11 MED ORDER — POLYVINYL ALCOHOL 1.4 % OP SOLN: 1.0000 [drp] | OPHTHALMIC | Status: DC | PRN

## 2024-07-11 MED ORDER — ACETAMINOPHEN 500 MG PO TABS
1000.0000 mg | ORAL_TABLET | Freq: Every day | ORAL | Status: DC
  Administered 2024-07-11 – 2024-07-13 (×3): 1000 mg via ORAL
  Filled 2024-07-11 (×3): qty 2

## 2024-07-11 NOTE — Progress Notes (Signed)
 Heart Failure Navigator Progress Note  Assessed for Heart & Vascular TOC clinic readiness.  Patient does not meet criteria due to she has a scheduled [facility] appointment on 07/14/2024. No HF TOC. .   Navigator will sign off at this time.   Stephane Haddock, BSN, Scientist, Clinical (histocompatibility And Immunogenetics) Only

## 2024-07-11 NOTE — Progress Notes (Signed)
 Found patient off Bipap on [place]. Patient has no increased WOB and states her breathing feels fine.

## 2024-07-11 NOTE — Progress Notes (Signed)
" °  Carryover admission to the Day Admitter.  I discussed this case with the EDP, Dr. Haze.  Per these discussions:   This is a 89 year old female with history of chronic diastolic heart failure, permanent atrial fibrillation chronically anticoagulated on edoxaban , who is being admitted with acute on chronic diastolic heart failure after presenting with complaint of shortness of breath and new edema in the bilateral lower extremities, which is confirmed by family member present at bedside.  In atrial fibrillation with heart rates in the low 100s.  On Multaq  as an outpatient.  Chest x-ray suggestive of pulmonary edema.  Noted to have evidence of wheezing, felt to be cardiac wheezing.  Started on BiPAP for patient comfort.  Received dose of 40 mg of IV Lasix , with good ensuing urine output.    Initial blood pressure elevated, which is improved with sublingual nitroglycerin  x 1 as well as initiation of BiPAP.  Troponin x 2 were both found to be less than 15.  No reported evidence of acute ischemic changes on EKG.   EDP discussed patient's case with on-call cardiologist, Dr. Michiel, who recommended additional IV diuresis, permissive tachycardia while treating underlying source of patient's atrial fibrillation with RVR, which is her acute on chronic diastolic heart failure.  If need for further rate control, cardiology recommended consideration for digoxin .  Cardiology available as needed for formal consultation.   I have placed an order for inpatient admission for further evaluation management of the above.  I have placed some additional preliminary admit orders via the adult multi-morbid admission order set. I have also ordered additional Lasix  40 mg IV twice daily, with next dose to occur around 10 AM this morning.  If Patient n.p.o. while she remains on the BiPAP and added on a magnesium level.     Eva Pore, DO Hospitalist  "

## 2024-07-11 NOTE — Consult Note (Addendum)
 "  Cardiology Consultation   Patient ID: Tammy Walker MRN: [id number]; DOB: 1930-09-20  Admit date: 07/11/2024 Date of Consult: 07/11/2024  PCP:  Alben Therisa MATSU, PA   [facility] Providers Cardiologist:  Oneil Parchment, MD     Patient Profile: Tammy Walker is a 89 y.o. female with a hx of persistent atrial fibrillation, chronic HFmrEF, first-degree AV block, LBBB, aortic atherosclerosis, mitral regurgitation, CKD II, who is being seen 07/11/2024 for the evaluation of CHF at the request of Dr Claudene .  History of Present Illness: [name] with above PMH presented to ER for chest pain and SOB.Daughter assisted history at bedside.  She is very HOH. She had chronic chest pain for >10 years that relieves by heat and NSAID cream. 07/10/24 night she had severe chest pain of entire chest wall with SOB and elevated BP 150-160s. She had noted some recent ankle edema. She states chest pain felt like squeezing and tight sensation. Daughter felt she was having wheezing as well. She normally is not aware of her A fib. She had discussion with Dr Parchment and did not wish for AVN ablation and PPM in the past. Her daughter Mliss takes care of her, wishes conservative management and they continue not wish surgery. She had issue with low BP in the past.   Per ER work up, proBNP 1459, CBC and CMP unremarkable. Hs trop <15 x2. TSH 4.5, FT4 WNL. CXR showed no acute cardiopulmonary abnormality. She was admitted for acute CHF and uncontrolled HTN, given IV Lasix  at ED. Cardiology is consulted for further evaluation.   Per chart review, she sees A fib clinic, The patient was initially diagnosed with atrial fibrillation in 2002. Her A fib has been difficult to control over the years. History of digoxin  and metoprolol  use with conversion to NSR but later metoprolol  discontinued due to dizziness and hypotension. History of amiodarone  discontinued due to eye deposits seen by ophthalmology in 2021. She was placed on  Multaq  400 mg BID and edoxaban  (Savaysa ) for anticoagulation in 2023. She required DCCV 10/2022 and 11/2022, ultimately went back on Amiodarone  due to recurrence of A fib. Amiodaonre was reduced to 100mg  daily by Dr Parchment in June 2025 due to hypotension. She followed uo with A fib clinic 01/26/24 , maintained in SR, continued on amiodarone  100mg  daily and Savaysa .   She has chronic HFpEF. Echo 10/17/22 showed LVEF 45-50%, septal hypokinesis and septal-lateral dyssynchrony consistent with LBBB, moderate asymmetric left ventricular hypertrophy of the basal-septal segment. Normal RV. Moderalty elevated PASP 45.7 mmHg. Severe LAE and RAE. Moderate to severe MR. Moderate TR. Mild AI. Aortic sclerosis. Lasix  stopped by Dr Ginny in June 2025 due to hypotension.   Past Medical History:  Diagnosis Date   Allergy    Arrhythmia    Atrial fibrillation (HCC)    GERD (gastroesophageal reflux disease)    History of bone scan    Bone Density Scans 2017 & 2019 Dr. Malcolm    History of chest x-ray    Apart from large hiatus hernia, normal heart, lungs and mediastinum. Per records from [facility]     History of CT scan of abdomen 10/24/2017   Gallstones within a thin-waled gallbladder, Per records from [facility]    History of echocardiogram    [facility] &  following   [facility]    History of EKG    Sinus rhythm, borderline 1st deg block, LAD completely changed axis, LBBB(old). Per [facility]    Hyperlipidemia    Hypertension    Insomnia    Left lower lobe pneumonia 09/20/2015   Per records from [facility]    Nodule of right lung    [facility],    Osteoporosis    on fosamax   Osteoporosis    Reduced mobility     Past Surgical History:  Procedure Laterality Date   APPENDECTOMY  [address], [place]    CARDIOVERSION N/A 10/29/2022   Procedure: CARDIOVERSION;  Surgeon: Francyne Headland, MD;  Location: [facility] INVASIVE CV LAB;  Service: Cardiovascular;  Laterality: N/A;   CARDIOVERSION N/A 12/01/2022   Procedure: CARDIOVERSION;  Surgeon: Santo Stanly LABOR, MD;  Location: [facility] INVASIVE CV LAB;  Service: Cardiovascular;  Laterality: N/A;   CT SCAN     2017, 2018, 2019 -- re lungs see records    TUBAL LIGATION  1978   [place], [place], [place]      Home Medications:  Prior to Admission medications  Medication Sig Start Date End Date Taking? Authorizing Provider  acetaminophen  (TYLENOL ) 500 MG tablet 2 tablets in the AM, 1 tablet in the afternoon, and 1 tablet at night Orally three times a day Patient taking differently: Taking 3 tablets by mouth daily and additional 1 tablet if needed    [provider]  amiodarone  (PACERONE ) 200 MG tablet Take 0.5 tablets (100 mg total) by mouth daily. 12/18/23   Jeffrie Oneil BROCKS, MD  atorvastatin  (LIPITOR) 20 MG tablet TAKE 1 TABLET(20 MG) BY MOUTH AT BEDTIME 09/28/23   Jeffrie Oneil BROCKS, MD  bisacodyl  (DULCOLAX) 5 MG EC tablet Take 10 mg by mouth at bedtime.    [provider]  Ca Phosphate-Cholecalciferol  (CALCIUM  WITH D3) 250-12.5 MG-MCG CHEW Chew 2 tablets by mouth 2 (two) times daily. Patient taking differently: Chew 1 tablet by mouth 2 (two) times daily.    [provider]  carboxymethylcellulose (REFRESH TEARS) 0.5 % SOLN Place 1 drop into both eyes 3 (three) times daily as needed (dry eyes).    [provider]  cetirizine (ZYRTEC) 10 MG tablet Take 10 mg by mouth every morning.    [provider]  clotrimazole (LOTRIMIN) 1 % cream Apply 1 application. topically 2 (two) times daily as needed (fungus).    [provider]  denosumab  (PROLIA ) 60 MG/ML SOSY injection Inject 60 mg into the skin every 6 (six) months.    [provider]  diclofenac  Sodium (VOLTAREN ) 1 % GEL Apply 4 g topically 4 (four) times daily as needed (back pain).    [provider]  edoxaban  (SAVAYSA ) 30  MG TABS tablet Take 1 tablet (30 mg total) by mouth daily. 08/03/23   Jeffrie Oneil BROCKS, MD  hydrocortisone  (ANUSOL -HC) 2.5 % rectal cream Apply 1 Application topically daily as needed. 03/21/24   [provider]  Hydrocortisone  (PREPARATION H EX) Apply 1 Application topically daily as needed (hemorrhoids).    [provider]  ipratropium (ATROVENT ) 0.03 % nasal spray Place 2 sprays into both nostrils every 12 (twelve) hours. 05/16/24   Masciello, Hadassah BROCKS, MD  lidocaine  4 % Place 1 patch onto the skin daily. 02/12/24   Urbano Albright, MD  LINZESS 72 MCG capsule Take 72 mcg by mouth daily as needed. Patient taking differently: Take 72 mcg by mouth as needed. 06/10/23   [provider]  Multiple Vitamins-Minerals (MULTIVITAMIN ADULTS PO) Calcium  250 mg and vitamin D - 500 Units- taking 3  gummies by mouth daily    [provider]  Multiple Vitamins-Minerals (PRESERVISION AREDS 2 PO) Take 1 capsule by mouth 2 (two) times daily.    [provider]  ondansetron  (ZOFRAN ) 4 MG tablet Take 1 tablet (4 mg total) by mouth every 8 (eight) hours as needed for nausea or vomiting. Patient taking differently: Take 4 mg by mouth as needed for nausea or vomiting. 09/09/21   Rai, Nydia POUR, MD  oxyCODONE  (OXY IR/ROXICODONE ) 5 MG immediate release tablet Take 0.5 tablets (2.5 mg total) by mouth every 12 (twelve) hours as needed for severe pain (pain score 7-10). 06/02/24   Emeline Joesph BROCKS, DO  oxyCODONE  (OXYCONTIN ) 10 mg 12 hr tablet Take 1 tablet (10 mg total) by mouth every 12 (twelve) hours. 06/20/24   Urbano Albright, MD  OXYCONTIN  10 MG 12 hr tablet Take 1 tablet (10 mg total) by mouth every 12 (twelve) hours. 04/14/24   Urbano Albright, MD  Polyethyl Glycol-Propyl Glycol (SYSTANE) 0.4-0.3 % GEL ophthalmic gel Place 1 Application into both eyes at bedtime.    [provider]  polyethylene glycol (MIRALAX  / GLYCOLAX ) 17 g packet Take 17 g by mouth 2 (two) times  daily.    [provider]  pregabalin  (LYRICA ) 25 MG capsule Take 1 capsule (25 mg total) by mouth See admin instructions. Taking 75 mg by mouth in the morning, 25 mg afternoon and 25 mg in the evening 04/19/24   Urbano Albright, MD  pregabalin  (LYRICA ) 75 MG capsule Take 1 capsule (75 mg total) by mouth daily. 04/19/24   Urbano Albright, MD  shark liver oil-cocoa butter (PREPARATION H) 0.25-3-85.5 % suppository Place 1 suppository rectally as needed for hemorrhoids.    [provider]  sodium chloride  (OCEAN) 0.65 % nasal spray as directed Nasally    [provider]  triamcinolone cream (KENALOG) 0.1 % 1 Application. Patient taking differently: Apply 1 Application topically as needed. 08/24/23   [provider]    Scheduled Meds:  acetaminophen   1,000 mg Oral Daily   And   acetaminophen   500 mg Oral Q24H   And   acetaminophen   500 mg Oral QHS   amiodarone   100 mg Oral Daily   atorvastatin   20 mg Oral QHS   bisacodyl   10 mg Oral QHS   edoxaban   30 mg Oral Q24H   furosemide   40 mg Intravenous Once   lidocaine   1 patch Transdermal Q24H   loratadine   10 mg Oral Daily   oxyCODONE   10 mg Oral Q12H   polyethylene glycol  17 g Oral BID   pregabalin   25 mg Oral BID AC   pregabalin   75 mg Oral Daily   sodium chloride  flush  3 mL Intravenous Q12H   Continuous Infusions:  sodium chloride      PRN Meds: sodium chloride , acetaminophen  **OR** acetaminophen , artificial tears, diclofenac  Sodium, hydrALAZINE , melatonin, nitroGLYCERIN , ondansetron  (ZOFRAN ) IV, oxyCODONE , sodium chloride  flush  Allergies:   Allergies[1]  Social History:   Social History   Socioeconomic History   Marital status: Widowed    Spouse name: Not on file   Number of children: Not on file   Years of education: Not on file   Highest education level: Not on file  Occupational History   Not on file  Tobacco Use   Smoking status: Never   Smokeless tobacco: Never   Tobacco  comments:    Never smoked 05/21/23  Vaping Use   Vaping status: Never Used  Substance  and Sexual Activity   Alcohol  use: Not Currently    Comment: less than one drink a week   Drug use: No   Sexual activity: Never    Birth control/protection: None  Other Topics Concern   Not on file  Social History Narrative   Social History      Diet? I watch my intake of salt, sugar, and fat      Do you drink/eat things with caffeine? Yes       Marital status?      Widowed                               What year were you married? 1960      Do you live in a house, apartment, assisted living, condo, trailer, etc.? House       Is it one or more stories? one      How many persons live in your home? 2      Do you have any pets in your home? (please list) yes 2 cats       Highest level of education completed? 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             yes                         Type & how often? Strength & Balance       Advanced Directives      Do you have a living will?  No       Do you have a DNR form?        No                           If not, do you want to discuss one? No       Do you have signed POA/HPOA for forms? Yes       Functional Status      Do you have difficulty bathing or dressing yourself? No       Do you have difficulty preparing food or eating? No       Do you have difficulty managing your medications? No       Do you have difficulty managing your finances? No       Do you have difficulty affording your medications? No       Social Drivers of Health   Tobacco Use: Low Risk (07/11/2024)   Patient History    Smoking Tobacco Use: Never    Smokeless Tobacco Use: Never    Passive Exposure: Not on file  Financial Resource Strain: Not on file  Food Insecurity: Not on file  Transportation Needs: Not on file  Physical Activity: Not on file  Stress: Not on file   Social Connections: Not on file  Intimate Partner Violence: Not on file  Depression (PHQ2-9): Low Risk (04/14/2024)   Depression (PHQ2-9)    PHQ-2 Score: 0  Alcohol  Screen: Not on file  Housing: Not on file  Utilities: Not on file  Health Literacy: Not on file    Family History:    Family History  Problem Relation Age of Onset   Heart disease Mother    High blood pressure Mother  COPD Mother    Heart failure Mother    Heart disease Father    Heart disease Brother    Heart failure Brother    Hyperlipidemia Daughter    Hypertension Daughter      ROS:  Constitutional: Denied fever, chills, malaise, night sweats Eyes: Denied vision change or loss Ears/Nose/Mouth/Throat: Denied ear ache, sore throat, coughing, sinus pain Cardiovascular: see HPI  Respiratory: see HPI  Gastrointestinal: Denied nausea, vomiting, abdominal pain, diarrhea Genital/Urinary: Denied dysuria, hematuria, urinary frequency/urgency Musculoskeletal: Denied muscle ache, joint pain, weakness Skin: Denied rash, wound Neuro: Denied headache, dizziness, syncope Psych: Denied history of depression/anxiety  Endocrine: Denied history of diabetes    Physical Exam/Data: Vitals:   07/11/24 0732 07/11/24 0935 07/11/24 1130 07/11/24 1152  BP: 105/70  129/89   Pulse: 84  70 71  Resp: 20  18   Temp:  (!) 97.5 F (36.4 C)    TempSrc:  Oral    SpO2: 100%  97% 94%  Weight:       No intake or output data in the 24 hours ending 07/11/24 1324    07/11/2024    2:17 AM 04/14/2024    1:27 PM 02/12/2024    3:14 PM  Last 3 Weights  Weight (lbs) 115 lb -- --  Weight (kg) 52.164 kg -- --     Body mass index is 22.46 kg/m.    EKG:  The EKG was personally reviewed and demonstrates:    EKG today showed A fib 108bpm, LBBB  Telemetry:  Telemetry was personally reviewed and demonstrates:    A fib RVR 100s  Relevant CV Studies:   Echo 10/17/22:   1. Left ventricular ejection fraction, by estimation, is 45  to 50%. The  left ventricle has mildly decreased function. The left ventricle  demonstrates regional wall motion abnormalities with septal hypokinesis  and septal-lateral dyssynchrony consistent  with LBBB. There is moderate asymmetric left ventricular hypertrophy of  the basal-septal segment. Left ventricular diastolic parameters are  indeterminate.   2. Right ventricular systolic function is normal. The right ventricular  size is normal. There is moderately elevated pulmonary artery systolic  pressure. The estimated right ventricular systolic pressure is 45.7 mmHg.   3. Left atrial size was severely dilated.   4. Right atrial size was severely dilated.   5. The mitral valve is abnormal. 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 No evidence of  mitral stenosis.   6. Tricuspid valve regurgitation is moderate.   7. The aortic valve is tricuspid. There is moderate calcification of the  aortic valve. Aortic valve regurgitation is mild. Aortic valve  sclerosis/calcification is present, without any evidence of aortic  stenosis. Aortic valve mean gradient measures 6.2  mmHg.   8. Aortic dilatation noted. There is mild dilatation of the descending  aorta, measuring 39 mm.   9. The inferior vena cava is normal in size with <50% respiratory  variability, suggesting right atrial pressure of 8 mmHg.  10. The patient appeared to be in atrial fibrillation.    Laboratory Data: High Sensitivity Troponin:  No results for input(s): TROPONINIHS in the last 720 hours.  Recent Labs  Lab 07/11/24 0238 07/11/24 0407  TRNPT <15 <15      Chemistry Recent Labs  Lab 07/11/24 0237 07/11/24 0238  NA  --  136  K  --  4.2  CL  --  98  CO2  --  27  GLUCOSE  --  156*  BUN  --  16  CREATININE  --  0.79  CALCIUM   --  9.3  MG 2.2  --   GFRNONAA  --  >60  ANIONGAP  --  11    Recent Labs  Lab 07/11/24 0238  PROT 7.3  ALBUMIN 4.3  AST 37  ALT  18  ALKPHOS 67  BILITOT 0.7   Lipids No results for input(s): CHOL, TRIG, HDL, LABVLDL, LDLCALC, CHOLHDL in the last 168 hours.  Hematology Recent Labs  Lab 07/11/24 0238  WBC 6.7  RBC 4.50  HGB 13.9  HCT 43.5  MCV 96.7  MCH 30.9  MCHC 32.0  RDW 14.6  PLT 273   Thyroid   Recent Labs  Lab 07/11/24 0407  TSH 4.570*  FREET4 1.48    BNP Recent Labs  Lab 07/11/24 0238  PROBNP 1,459.0*    DDimer No results for input(s): DDIMER in the last 168 hours.  Radiology/Studies:  DG Chest 1 View Result Date: 07/11/2024 EXAM: 1 VIEW(S) XRAY OF THE CHEST 07/11/2024 02:57:00 AM COMPARISON: Chest x-ray 10/15/2022. CLINICAL HISTORY: short of breath, chest pain FINDINGS: LUNGS AND PLEURA: Patient is rotated. Chronic coarsened interstitial markings. No focal pulmonary opacity. No pleural effusion. No pneumothorax. HEART AND MEDIASTINUM: Atherosclerotic plaque. Large hiatal hernia retrocardiac. BONES AND SOFT TISSUES: No acute osseous abnormality. IMPRESSION: 1. No acute cardiopulmonary abnormality. 2. Large retrocardiac hiatal hernia. Electronically signed by: Morgane Naveau MD MD 07/11/2024 03:02 AM EST RP Workstation: [id number]     Assessment and Plan:  Acute on chronic HFmrEF  - presented with SOB at rest, severe chest wall pain, wheezing in the setting of elevated BP, required brief BIPAP support and now wean to room air, improved with morphine  and nitroglycerin  and lasix   - Hs trop <15 x2, negative for ACS - BNP 1459, CXR without acute finding  - she does not appear volume overloaded on exam today  - Echo pending , EF not much changed  - would start PRN Lasix  for s/s of CHF in the future  - GDMT: hx of orthostasis and hypotension, would watch BP for now   Permanent A fib with RVR  - historically difficult to control, failed AAD and DCCVs, now on amiodarone  100mg  daily, does not wish AVN ablation and PPM - will repeat amiodarone  load IV - plan for DCCV, see further  discussion from attending MD  - continue PTA edoxaban  for anticoagulation   HTN - BP elevated at admission, now controlled    Risk Assessment/Risk Scores:  New York  Heart Association (NYHA) Functional Class NYHA Class II  CHA2DS2-VASc Score = 5   This indicates a 7.2% annual risk of stroke. The patient's score is based upon: CHF History: 0 HTN History: 1 Diabetes History: 0 Stroke History: 0 Vascular Disease History: 1 Age Score: 2 Gender Score: 1     For questions or updates, please contact [facility] Please consult [email or web address] for contact info under      Signed, Xika Zhao, NP  07/11/2024 1:24 PM   I have seen and examined the patient along with Xika Zhao, NP.  I have reviewed the chart, notes and new data.  I agree with PA/NP's note.  Key new complaints: She is unaware of palpitations and did not have any meaningful weight gain, but noticed increased abdominal tension and steadily worsening shortness of breath until she had frank dyspnea at rest and severe anterior chest pain.  With rate control and diuretics no longer has dyspnea or  chest pain at rest. Key examination changes: Irregular rhythm with mild tachycardia, clear lungs, normal jugular venous pulsations, no audible murmurs or pericardial rubs, no peripheral edema Key new findings / data: Atrial fibrillation with RVR, chronic LBBB.  Preliminary review of the echocardiogram shows EF around 40-45% with very severe septal-lateral dyssynchrony due to LBBB.  EF appears unchanged from previous echocardiogram.  PLAN: Acute on chronic heart failure with mildly reduced ejection fraction, exacerbation due to atrial fibrillation with rapid ventricular response despite treatment with very low-dose amiodarone . Plan elective cardioversion tomorrow after we load with some intravenous amiodarone  overnight to improve the odds of successful maintenance of sinus rhythm after cardioversion. Currently appears  clinically euvolemic.  Probably does not require a lot more diuretic therapy. Heart failure therapy has been limited by low blood pressure, but higher doses of amiodarone  p.o. should not lower her blood pressure.  Oral diuretics will be used cautiously to avoid hypotension.  Jerel Balding, MD, [facility] [phone] 07/11/2024, 5:03 PM     [1] No Known Allergies  "

## 2024-07-11 NOTE — ED Triage Notes (Signed)
 Pt arrives EMS from home with reports of sudden onset chest pain that woke her up tonight. Pt reports left sided chest pain. Pt with audible wheezing in triage. Pt with hx of afib.

## 2024-07-11 NOTE — H&P (Addendum)
 " History and Physical    Patient: [name] FMW:[id number] DOB: 02-04-1931 DOA: 07/11/2024 DOS: the patient was seen and examined on 07/11/2024 PCP: Alben Therisa MATSU, PA  Patient coming from: Home via EMS  Chief Complaint:  Chief Complaint  Patient presents with   Chest Pain   HPI: [name] is a 89 y.o. female with medical history significant of diastolic congestive heart failure, permanent atrial fibrillation on chronic anticoagulation, left bundle branch block, hypertension, hyperlipidemia, osteoporosis, GERD presents with severe chest pain and shortness of breath.  History is obtained with assistance of her daughter who is present at bedside due to the patient being very hard of hearing.  She has experienced chest pain for the last 10-12 years, localized to the sternum. The pain occasionally intensifies, described as 'stabbing' and sometimes reaching a severity of 15 on a scale of 1 to 10. She uses a heated rice bag and arthritis cream for relief, though the effectiveness is uncertain.  Last night, she had an episode of severe left-sided chest pain accompanied by difficulty breathing. Her daughter noted that her blood pressure was elevated during this episode. She has been experiencing weight gain over the past few weeks, though not a sudden increase. Her weight was stable at 112 pounds for a long time before this recent gain. She also experienced swelling in her ankles.  Daughter notes that she had previously been on Lasix  20 mg daily, but this was discontinued back in June  She has a history of extreme curvature of the spine and broken vertebrae, contributing to chronic back pain. Her pain management includes extended-release oxycodone  10 mg twice daily, pregabalin  75 mg in the morning and 25 mg in the afternoon and evening, and oxycodone  IR 5 mg as needed.  In the emergency department patient was noted to be afebrile with pulse elevated up to 126 and atrial fibrillation,  respirations 18-33, blood pressures elevated up to 164/108, and O2 saturations initially maintained on BiPAP.  She was able to be weaned to nasal cannula oxygen.  Labs significant for proBNP 1459, high-sensitivity troponins negative x 2, potassium 4.2, magnesium 2.2, glucose 156, and TSH 4.57.  Chest x-ray revealed no acute abnormality with large retrocardiac hiatal hernia.  Patient had been given Lasix  40 mg IV, nitroglycerin  x 3, morphine  2 mg, and Zofran .  Review of Systems: As mentioned in the history of present illness. All other systems reviewed and are negative. Past Medical History:  Diagnosis Date   Allergy    Arrhythmia    Atrial fibrillation (HCC)    GERD (gastroesophageal reflux disease)    History of bone scan    Bone Density Scans 2017 & 2019 Dr. Malcolm    History of chest x-ray    Apart from large hiatus hernia, normal heart, lungs and mediastinum. Per records from [facility]     History of CT scan of abdomen 10/24/2017   Gallstones within a thin-waled gallbladder, Per records from [facility]    History of echocardiogram    [facility] &  following   [facility]    History of EKG    Sinus rhythm, borderline 1st deg block, LAD completely changed axis, LBBB(old). Per records from [facility]    Hyperlipidemia    Hypertension    Insomnia    Left lower lobe pneumonia 09/20/2015   Per records from [facility]    Nodule of right lung    Per records from [facility],  Osteoporosis    on fosamax   Osteoporosis    Reduced mobility    Past Surgical History:  Procedure Laterality Date   APPENDECTOMY  [address], [place]    CARDIOVERSION N/A 10/29/2022   Procedure: CARDIOVERSION;  Surgeon: Francyne Headland, MD;  Location: [facility] INVASIVE CV LAB;  Service: Cardiovascular;  Laterality: N/A;   CARDIOVERSION N/A 12/01/2022   Procedure: CARDIOVERSION;  Surgeon: Santo Stanly LABOR, MD;  Location:  [facility] INVASIVE CV LAB;  Service: Cardiovascular;  Laterality: N/A;   CT SCAN     2017, 2018, 2019 -- re lungs see records    TUBAL LIGATION  1978   [place], [place], [place]    Social History:  reports that she has never smoked. She has never used smokeless tobacco. She reports that she does not currently use alcohol . She reports that she does not use drugs.  Allergies[1]  Family History  Problem Relation Age of Onset   Heart disease Mother    High blood pressure Mother    COPD Mother    Heart failure Mother    Heart disease Father    Heart disease Brother    Heart failure Brother    Hyperlipidemia Daughter    Hypertension Daughter     Prior to Admission medications  Medication Sig Start Date End Date Taking? Authorizing Provider  acetaminophen  (TYLENOL ) 500 MG tablet 2 tablets in the AM, 1 tablet in the afternoon, and 1 tablet at night Orally three times a day Patient taking differently: Taking 3 tablets by mouth daily and additional 1 tablet if needed    [provider]  amiodarone  (PACERONE ) 200 MG tablet Take 0.5 tablets (100 mg total) by mouth daily. 12/18/23   Jeffrie Oneil BROCKS, MD  atorvastatin  (LIPITOR) 20 MG tablet TAKE 1 TABLET(20 MG) BY MOUTH AT BEDTIME 09/28/23   Jeffrie Oneil BROCKS, MD  bisacodyl  (DULCOLAX) 5 MG EC tablet Take 10 mg by mouth at bedtime.    [provider]  Ca Phosphate-Cholecalciferol  (CALCIUM  WITH D3) 250-12.5 MG-MCG CHEW Chew 2 tablets by mouth 2 (two) times daily. Patient taking differently: Chew 1 tablet by mouth 2 (two) times daily.    [provider]  carboxymethylcellulose (REFRESH TEARS) 0.5 % SOLN Place 1 drop into both eyes 3 (three) times daily as needed (dry eyes).    [provider]  cetirizine (ZYRTEC) 10 MG tablet Take 10 mg by mouth every morning.    [provider]  clotrimazole (LOTRIMIN) 1 % cream Apply 1 application. topically 2 (two) times daily as needed (fungus).    [provider]  denosumab   (PROLIA ) 60 MG/ML SOSY injection Inject 60 mg into the skin every 6 (six) months.    [provider]  diclofenac  Sodium (VOLTAREN ) 1 % GEL Apply 4 g topically 4 (four) times daily as needed (back pain).    [provider]  edoxaban  (SAVAYSA ) 30 MG TABS tablet Take 1 tablet (30 mg total) by mouth daily. 08/03/23   Jeffrie Oneil BROCKS, MD  hydrocortisone  (ANUSOL -HC) 2.5 % rectal cream Apply 1 Application topically daily as needed. 03/21/24   [provider]  Hydrocortisone  (PREPARATION H EX) Apply 1 Application topically daily as needed (hemorrhoids).    [provider]  ipratropium (ATROVENT ) 0.03 % nasal spray Place 2 sprays into both nostrils every 12 (twelve) hours. 05/16/24   Masciello, Hadassah BROCKS, MD  lidocaine  4 % Place 1 patch onto the skin daily. 02/12/24   Urbano Albright, MD  LINZESS 72 MCG capsule Take 72 mcg by mouth daily as needed. Patient taking differently: Take 72 mcg by mouth as needed. 06/10/23   [provider]  Multiple Vitamins-Minerals (MULTIVITAMIN ADULTS PO) Calcium  250 mg and vitamin D - 500 Units- taking 3 gummies by mouth daily    [provider]  Multiple Vitamins-Minerals (PRESERVISION AREDS 2 PO) Take 1 capsule by mouth 2 (two) times daily.    [provider]  ondansetron  (ZOFRAN ) 4 MG tablet Take 1 tablet (4 mg total) by mouth every 8 (eight) hours as needed for nausea or vomiting. Patient taking differently: Take 4 mg by mouth as needed for nausea or vomiting. 09/09/21   Rai, Nydia POUR, MD  oxyCODONE  (OXY IR/ROXICODONE ) 5 MG immediate release tablet Take 0.5 tablets (2.5 mg total) by mouth every 12 (twelve) hours as needed for severe pain (pain score 7-10). 06/02/24   Emeline Joesph BROCKS, DO  oxyCODONE  (OXYCONTIN ) 10 mg 12 hr tablet Take 1 tablet (10 mg total) by mouth every 12 (twelve) hours. 06/20/24   Urbano Albright, MD  OXYCONTIN  10 MG 12 hr tablet Take 1 tablet (10 mg total) by mouth every 12 (twelve) hours.  04/14/24   Urbano Albright, MD  Polyethyl Glycol-Propyl Glycol (SYSTANE) 0.4-0.3 % GEL ophthalmic gel Place 1 Application into both eyes at bedtime.    [provider]  polyethylene glycol (MIRALAX  / GLYCOLAX ) 17 g packet Take 17 g by mouth 2 (two) times daily.    [provider]  pregabalin  (LYRICA ) 25 MG capsule Take 1 capsule (25 mg total) by mouth See admin instructions. Taking 75 mg by mouth in the morning, 25 mg afternoon and 25 mg in the evening 04/19/24   Urbano Albright, MD  pregabalin  (LYRICA ) 75 MG capsule Take 1 capsule (75 mg total) by mouth daily. 04/19/24   Urbano Albright, MD  shark liver oil-cocoa butter (PREPARATION H) 0.25-3-85.5 % suppository Place 1 suppository rectally as needed for hemorrhoids.    [provider]  sodium chloride  (OCEAN) 0.65 % nasal spray as directed Nasally    [provider]  triamcinolone cream (KENALOG) 0.1 % 1 Application. Patient taking differently: Apply 1 Application topically as needed. 08/24/23   [provider]    Physical Exam: Vitals:   07/11/24 0430 07/11/24 0500 07/11/24 0700 07/11/24 0732  BP: 117/81 105/65 107/63 105/70  Pulse: (!) 105 (!) 110 91 84  Resp:  (!) 22  20  Temp:      TempSrc:      SpO2: 100% 96% 100% 100%  Weight:       Constitutional: Elderly female NAD, calm, comfortable Eyes: PERRL, lids and conjunctivae normal ENMT: Mucous membranes are moist.  Hard of hearing even with hearing aid in place.  Normal dentition.  Neck: normal, supple  Respiratory: Decreased respiratory effort, but clear to auscultation with no wheezing.  No accessory muscle use.  Currently on 2 L of nasal cannula oxygen with O2 saturations maintained Cardiovascular: Irregular irregular.  Trace lower extremity edema. 2+ pedal pulses.   Abdomen: no tenderness, no masses palpated.   Bowel sounds positive.  Musculoskeletal: no clubbing / cyanosis.  Thoracic kyphosis present. Skin: no rashes, lesions,  ulcers. No induration Neurologic: CN 2-12 grossly intact.   Strength 5/5 in all 4.  Psychiatric: Normal judgment and insight. Alert and oriented x 3. Normal mood.   Data Reviewed:  EKG revealed atrial fibrillation at 108 bpm with left bundle branch block.  Reviewed labs, imaging, and pertinent records  as documented.  Assessment and Plan:   Acute respiratory distress secondary to Heart failure with mildly reduced ejection fraction Acute on chronic.  Patient presented with acute onset of chest pain with reports of shortness of breath.  Does not appear to have been documented hypoxic, but was temporarily placed on BiPAP due to work of breathing.  Notes increase in weight over the last couple weeks as well as leg swelling.  proBNP elevated at 1459.  High-sensitivity troponins were negative x 2 and EKG did not show any significant ischemic changes.  Last echocardiogram noted EF to be 45 to 50% with normal right ventricular function and indeterminate diastolic parameters when last [id number].  Patient had been given Lasix  40 mg IV in the emergency department with output of approximately 1 L of urine. - Admit to a progressive bed - Heart failure order set utilized - Continuous pulse oximetry with oxygen maintain O2 saturations currently 92% - Plan on additional dose of Lasix  40 mg IV this evening.  Reassess and adjust diuresis as deemed medically appropriate - Check echocardiogram - Cardiology consulted, will follow-up for any further recommendations  Hypertensive urgency Initial blood pressures elevated up to 164/108.  Patient had been given Lasix  as well as nitroglycerin  with improvement in blood pressure.   - Continue to monitor - Hydralazine  IV as needed for elevated blood pressures  Permanent atrial fibrillation on chronic anticoagulation Patient had to be in atrial fibrillation with heart rates in the low 100s. CHA2DS2-VASc score equal to 5. - Goal potassium at least 4 and magnesium at  least 2.  Replace electrolytes to goal - Continue amiodarone  and edoxaban   Chronic pain Patient has a history of chronic pain related to her back as well as sternal pain. - Continue OxyContin , Lyrica , oxycodone  as needed, and current bowel regimen  Hyperlipidemia - Continue atorvastatin   Subclinical hypothyroidism TSH noted to be elevated at 4.57. - Add on free T4 (1.48)  Hiatal hernia She is not on any antiacid medications for treatment.   DVT prophylaxis: Edoxaban  Advance Care Planning:   Code Status: Do not attempt resuscitation (DNR) PRE-ARREST INTERVENTIONS DESIRED    Consults: Cardiology  Family Communication: Daughter updated at bedside  Severity of Illness: The appropriate patient status for this patient is INPATIENT. Inpatient status is judged to be reasonable and necessary in order to provide the required intensity of service to ensure the patient's safety. The patient's presenting symptoms, physical exam findings, and initial radiographic and laboratory data in the context of their chronic comorbidities is felt to place them at high risk for further clinical deterioration. Furthermore, it is not anticipated that the patient will be medically stable for discharge from the hospital within 2 midnights of admission.   * I certify that at the point of admission it is my clinical judgment that the patient will require inpatient hospital care spanning beyond 2 midnights from the point of admission due to high intensity of service, high risk for further deterioration and high frequency of surveillance required.*  Author: Maximino DELENA Sharps, MD 07/11/2024 7:39 AM  For on call review [email or web address].      [1] No Known Allergies  "

## 2024-07-11 NOTE — ED Provider Notes (Signed)
 " [place] EMERGENCY DEPARTMENT AT [facility] Provider Note   CSN: [id number] Arrival date & time: 07/11/24  9786     Patient presents with: Chest Pain   Tammy Walker is a 89 y.o. female.   Presents to the emergency department for evaluation of chest pain.  Patient reports that she woke up with pain on the left side of her chest tonight.  Patient reports that her legs are swollen, she does not normally have swelling of her legs.       Prior to Admission medications  Medication Sig Start Date End Date Taking? Authorizing Provider  acetaminophen  (TYLENOL ) 500 MG tablet 2 tablets in the AM, 1 tablet in the afternoon, and 1 tablet at night Orally three times a day Patient taking differently: Taking 3 tablets by mouth daily and additional 1 tablet if needed    [provider]  amiodarone  (PACERONE ) 200 MG tablet Take 0.5 tablets (100 mg total) by mouth daily. 12/18/23   Jeffrie Oneil BROCKS, MD  atorvastatin  (LIPITOR) 20 MG tablet TAKE 1 TABLET(20 MG) BY MOUTH AT BEDTIME 09/28/23   Jeffrie Oneil BROCKS, MD  bisacodyl  (DULCOLAX) 5 MG EC tablet Take 10 mg by mouth at bedtime.    [provider]  Ca Phosphate-Cholecalciferol  (CALCIUM  WITH D3) 250-12.5 MG-MCG CHEW Chew 2 tablets by mouth 2 (two) times daily. Patient taking differently: Chew 1 tablet by mouth 2 (two) times daily.    [provider]  carboxymethylcellulose (REFRESH TEARS) 0.5 % SOLN Place 1 drop into both eyes 3 (three) times daily as needed (dry eyes).    [provider]  cetirizine (ZYRTEC) 10 MG tablet Take 10 mg by mouth every morning.    [provider]  clotrimazole (LOTRIMIN) 1 % cream Apply 1 application. topically 2 (two) times daily as needed (fungus).    [provider]  denosumab  (PROLIA ) 60 MG/ML SOSY injection Inject 60 mg into the skin every 6 (six) months.    [provider]  diclofenac  Sodium (VOLTAREN ) 1 % GEL Apply 4 g topically 4 (four) times  daily as needed (back pain).    [provider]  edoxaban  (SAVAYSA ) 30 MG TABS tablet Take 1 tablet (30 mg total) by mouth daily. 08/03/23   Jeffrie Oneil BROCKS, MD  hydrocortisone  (ANUSOL -HC) 2.5 % rectal cream Apply 1 Application topically daily as needed. 03/21/24   [provider]  Hydrocortisone  (PREPARATION H EX) Apply 1 Application topically daily as needed (hemorrhoids).    [provider]  ipratropium (ATROVENT ) 0.03 % nasal spray Place 2 sprays into both nostrils every 12 (twelve) hours. 05/16/24   Masciello, Hadassah BROCKS, MD  lidocaine  4 % Place 1 patch onto the skin daily. 02/12/24   Urbano Albright, MD  LINZESS 72 MCG capsule Take 72 mcg by mouth daily as needed. Patient taking differently: Take 72 mcg by mouth as needed. 06/10/23   [provider]  Multiple Vitamins-Minerals (MULTIVITAMIN ADULTS PO) Calcium  250 mg and vitamin D - 500 Units- taking 3 gummies by mouth daily    [provider]  Multiple Vitamins-Minerals (PRESERVISION AREDS 2 PO) Take 1 capsule by mouth 2 (two) times daily.    [provider]  ondansetron  (ZOFRAN ) 4 MG tablet Take 1 tablet (4 mg total) by mouth every 8 (eight) hours as needed for nausea or vomiting. Patient taking differently: Take 4 mg by mouth as needed for nausea or vomiting. 09/09/21   Rai, Nydia POUR, MD  oxyCODONE  (OXY IR/ROXICODONE )  5 MG immediate release tablet Take 0.5 tablets (2.5 mg total) by mouth every 12 (twelve) hours as needed for severe pain (pain score 7-10). 06/02/24   Emeline Joesph BROCKS, DO  oxyCODONE  (OXYCONTIN ) 10 mg 12 hr tablet Take 1 tablet (10 mg total) by mouth every 12 (twelve) hours. 06/20/24   Urbano Albright, MD  OXYCONTIN  10 MG 12 hr tablet Take 1 tablet (10 mg total) by mouth every 12 (twelve) hours. 04/14/24   Urbano Albright, MD  Polyethyl Glycol-Propyl Glycol (SYSTANE) 0.4-0.3 % GEL ophthalmic gel Place 1 Application into both eyes at bedtime.    [provider]   polyethylene glycol (MIRALAX  / GLYCOLAX ) 17 g packet Take 17 g by mouth 2 (two) times daily.    [provider]  pregabalin  (LYRICA ) 25 MG capsule Take 1 capsule (25 mg total) by mouth See admin instructions. Taking 75 mg by mouth in the morning, 25 mg afternoon and 25 mg in the evening 04/19/24   Urbano Albright, MD  pregabalin  (LYRICA ) 75 MG capsule Take 1 capsule (75 mg total) by mouth daily. 04/19/24   Urbano Albright, MD  shark liver oil-cocoa butter (PREPARATION H) 0.25-3-85.5 % suppository Place 1 suppository rectally as needed for hemorrhoids.    [provider]  sodium chloride  (OCEAN) 0.65 % nasal spray as directed Nasally    [provider]  triamcinolone cream (KENALOG) 0.1 % 1 Application. Patient taking differently: Apply 1 Application topically as needed. 08/24/23   [provider]    Allergies: Patient has no known allergies.    Review of Systems  Respiratory:  Positive for shortness of breath.   Cardiovascular:  Positive for chest pain and leg swelling.    Updated Vital Signs BP (!) 162/149   Pulse (!) 126   Temp (!) 96.1 F (35.6 C) (Axillary)   Resp 18   Wt 52.2 kg   SpO2 96%   BMI 22.46 kg/m   Physical Exam Vitals and nursing note reviewed.  Constitutional:      General: She is not in acute distress.    Appearance: She is well-developed.  HENT:     Head: Normocephalic and atraumatic.     Mouth/Throat:     Mouth: Mucous membranes are moist.  Eyes:     General: Vision grossly intact. Gaze aligned appropriately.     Extraocular Movements: Extraocular movements intact.     Conjunctiva/sclera: Conjunctivae normal.  Cardiovascular:     Rate and Rhythm: Normal rate and regular rhythm.     Pulses: Normal pulses.     Heart sounds: Normal heart sounds, S1 normal and S2 normal. No murmur heard.    No friction rub. No gallop.  Pulmonary:     Effort: Accessory muscle usage and prolonged expiration present. No respiratory  distress.     Breath sounds: Rales present.  Abdominal:     General: Bowel sounds are normal.     Palpations: Abdomen is soft.     Tenderness: There is no abdominal tenderness. There is no guarding or rebound.     Hernia: No hernia is present.  Musculoskeletal:        General: No swelling.     Cervical back: Full passive range of motion without pain, normal range of motion and neck supple. No spinous process tenderness or muscular tenderness. Normal range of motion.     Right lower leg: 2+ Pitting Edema present.     Left lower leg: 2+ Pitting Edema present.  Skin:  General: Skin is warm and dry.     Capillary Refill: Capillary refill takes less than 2 seconds.     Findings: No ecchymosis, erythema, rash or wound.  Neurological:     General: No focal deficit present.     Mental Status: She is alert and oriented to person, place, and time.     GCS: GCS eye subscore is 4. GCS verbal subscore is 5. GCS motor subscore is 6.     Cranial Nerves: Cranial nerves 2-12 are intact.     Sensory: Sensation is intact.     Motor: Motor function is intact.     Coordination: Coordination is intact.  Psychiatric:        Attention and Perception: Attention normal.        Mood and Affect: Mood normal.        Speech: Speech normal.        Behavior: Behavior normal.     (all labs ordered are listed, but only abnormal results are displayed) Labs Reviewed  COMPREHENSIVE METABOLIC PANEL WITH GFR - Abnormal; Notable for the following components:      Result Value   Glucose, Bld 156 (*)    All other components within normal limits  PRO BRAIN NATRIURETIC PEPTIDE - Abnormal; Notable for the following components:   Pro Brain Natriuretic Peptide 1,459.0 (*)    All other components within normal limits  CBC WITH DIFFERENTIAL/PLATELET  TSH  TROPONIN T, HIGH SENSITIVITY  TROPONIN T, HIGH SENSITIVITY    EKG: EKG Interpretation Date/Time:  Monday July 11 2024 02:24:52 EST Ventricular Rate:  108 PR  Interval:    QRS Duration:  162 QT Interval:  377 QTC Calculation: 506 R Axis:   11  Text Interpretation: Atrial fibrillation Left bundle branch block No acute changes Confirmed by Haze Lonni PARAS [phone]) on 07/11/2024 2:36:27 AM  Radiology: DG Chest 1 View Result Date: 07/11/2024 EXAM: 1 VIEW(S) XRAY OF THE CHEST 07/11/2024 02:57:00 AM COMPARISON: Chest x-ray 10/15/2022. CLINICAL HISTORY: short of breath, chest pain FINDINGS: LUNGS AND PLEURA: Patient is rotated. Chronic coarsened interstitial markings. No focal pulmonary opacity. No pleural effusion. No pneumothorax. HEART AND MEDIASTINUM: Atherosclerotic plaque. Large hiatal hernia retrocardiac. BONES AND SOFT TISSUES: No acute osseous abnormality. IMPRESSION: 1. No acute cardiopulmonary abnormality. 2. Large retrocardiac hiatal hernia. Electronically signed by: Morgane Naveau MD MD 07/11/2024 03:02 AM EST RP Workstation: [id number]     Procedures   Medications Ordered in the ED  nitroGLYCERIN  (NITROSTAT ) SL tablet 0.4 mg (0.4 mg Sublingual Given 07/11/24 0356)  furosemide  (LASIX ) injection 40 mg (40 mg Intravenous Given 07/11/24 0241)  morphine  (PF) 2 MG/ML injection 2 mg (2 mg Intravenous Given 07/11/24 0241)  ondansetron  (ZOFRAN ) injection 4 mg (4 mg Intravenous Given 07/11/24 0241)  ondansetron  (ZOFRAN ) injection 4 mg (4 mg Intravenous Given 07/11/24 0350)                                    Medical Decision Making Amount and/or Complexity of Data Reviewed External Data Reviewed: labs, ECG and notes. Labs: ordered. Decision-making details documented in ED Course. Radiology: ordered and independent interpretation performed. Decision-making details documented in ED Course. ECG/medicine tests: ordered and independent interpretation performed. Decision-making details documented in ED Course.  Risk Prescription drug management. Decision regarding hospitalization.   Differential Diagnosis considered includes, but not limited  to: STEMI; NSTEMI; myocarditis; pericarditis; pulmonary embolism; aortic dissection; pneumothorax; pneumonia; gastritis; musculoskeletal  pain; CHF exacerbation; arrhythmia  Patient presents to the emergency department for evaluation of chest pain that was present when she awoke tonight.  Reviewing patient's records reveals a history of congestive heart failure and permanent atrial fibrillation.  Patient currently maintained on edoxaban  and Multaq  400 mg p.o. twice daily.  She underwent DC cardioversion in 2024 which was unsuccessful, now managed in permanent A-fib.  I do not see any records of CAD.  She has not had an echo since the cardioversion in 2024.  Patient exhibiting some increased work of breathing with audible wheezing at arrival.  No history of chronic lung disease.  Patient with 2+ pitting edema of both lower extremities which is reportedly new.  Suspect cardiac wheeze.  She does have a significant elevation of BNP.  Patient with mild tachycardia in the low 100s.  I am hesitant to treat this accelerated A-fib secondary to decompensated heart failure.  Patient given IV Lasix , has been diuresing.  Patient with persistent chest pain.  Looking through her records, she has had pain with accelerated A-fib rhythms.  She did not have any improvement with morphine .  Will try sublingual nitroglycerin  to help decrease preload as well as see if she has some improvement with her pain.  Trial of BiPAP to help with her breathing.  Discussed with Dr. Michiel.  Agrees with diuresis and permissive tachycardia (HR in the 110's) at this time. Digoxin  if tachycardia worsens.  Addendum: Blood pressure, pain improved with nitroglycerin .  Patient currently tolerating BiPAP.  Will admit to hospitalist service.  CRITICAL CARE Performed by: Lonni JINNY Seats   Total critical care time: 35 minutes  Critical care time was exclusive of separately billable procedures and treating other patients.  Critical  care was necessary to treat or prevent imminent or life-threatening deterioration.  Critical care was time spent personally by me on the following activities: development of treatment plan with patient and/or surrogate as well as nursing, discussions with consultants, evaluation of patient's response to treatment, examination of patient, obtaining history from patient or surrogate, ordering and performing treatments and interventions, ordering and review of laboratory studies, ordering and review of radiographic studies, pulse oximetry and re-evaluation of patient's condition.       Final diagnoses:  Permanent atrial fibrillation with RVR (HCC)  Chest pain, unspecified type  Acute on chronic congestive heart failure, unspecified heart failure type [facility])    ED Discharge Orders     None          Seats Lonni JINNY, MD 07/11/24 [phone]  "

## 2024-07-11 NOTE — Progress Notes (Addendum)
" °   07/11/24 0732  Therapy Vitals  Pulse Rate 84  Resp 20  BP 105/70  Patient Position (if appropriate) Lying  MEWS Score/Color  MEWS Score 0  MEWS Score Color Green  Oxygen Therapy/Pulse Ox  O2 Device Nasal Cannula  SpO2 100 %  O2 Therapy Oxygen   Patient o "

## 2024-07-12 ENCOUNTER — Inpatient Hospital Stay (HOSPITAL_COMMUNITY): Admitting: Anesthesiology

## 2024-07-12 ENCOUNTER — Encounter (HOSPITAL_COMMUNITY): Payer: Self-pay | Admitting: Internal Medicine

## 2024-07-12 ENCOUNTER — Encounter (HOSPITAL_COMMUNITY): Admission: EM | Disposition: A | Payer: Self-pay | Source: Home / Self Care | Attending: Internal Medicine

## 2024-07-12 ENCOUNTER — Inpatient Hospital Stay (HOSPITAL_COMMUNITY)

## 2024-07-12 DIAGNOSIS — I5043 Acute on chronic combined systolic (congestive) and diastolic (congestive) heart failure: Secondary | ICD-10-CM

## 2024-07-12 DIAGNOSIS — I4891 Unspecified atrial fibrillation: Secondary | ICD-10-CM

## 2024-07-12 DIAGNOSIS — I4821 Permanent atrial fibrillation: Principal | ICD-10-CM

## 2024-07-12 DIAGNOSIS — I160 Hypertensive urgency: Secondary | ICD-10-CM | POA: Diagnosis not present

## 2024-07-12 DIAGNOSIS — I110 Hypertensive heart disease with heart failure: Secondary | ICD-10-CM

## 2024-07-12 DIAGNOSIS — I340 Nonrheumatic mitral (valve) insufficiency: Secondary | ICD-10-CM

## 2024-07-12 HISTORY — PX: TRANSESOPHAGEAL ECHOCARDIOGRAM (CATH LAB): EP1270

## 2024-07-12 HISTORY — PX: CARDIOVERSION: EP1203

## 2024-07-12 LAB — BASIC METABOLIC PANEL WITH GFR
Anion gap: 8 (ref 5–15)
BUN: 14 mg/dL (ref 8–23)
CO2: 32 mmol/L (ref 22–32)
Calcium: 8.3 mg/dL — ABNORMAL LOW (ref 8.9–10.3)
Chloride: 96 mmol/L — ABNORMAL LOW (ref 98–111)
Creatinine, Ser: 0.75 mg/dL (ref 0.44–1.00)
GFR, Estimated: >60 mL/min
Glucose, Bld: 125 mg/dL — ABNORMAL HIGH (ref 70–99)
Potassium: 3.5 mmol/L (ref 3.5–5.1)
Sodium: 136 mmol/L (ref 135–145)

## 2024-07-12 LAB — MAGNESIUM: Magnesium: 2 mg/dL (ref 1.7–2.4)

## 2024-07-12 LAB — ECHO TEE
MV M vel: 4.6 m/s
MV Peak grad: 84.6 mmHg
Radius: 0.7 cm

## 2024-07-12 MED ORDER — PERFLUTREN LIPID MICROSPHERE
1.0000 mL | INTRAVENOUS | Status: AC | PRN
  Administered 2024-07-12: 1 mL via INTRAVENOUS

## 2024-07-12 MED ORDER — PHENOL 1.4 % MT LIQD
1.0000 | OROMUCOSAL | Status: DC | PRN
  Administered 2024-07-12 (×2): 1 via OROMUCOSAL
  Filled 2024-07-12: qty 177

## 2024-07-12 MED ORDER — SODIUM CHLORIDE 0.9 % IV SOLN: INTRAVENOUS | Status: DC | PRN

## 2024-07-12 MED ORDER — PROPOFOL 500 MG/50ML IV EMUL
INTRAVENOUS | Status: DC | PRN
  Administered 2024-07-12: 100 ug/kg/min via INTRAVENOUS

## 2024-07-12 MED ORDER — SODIUM CHLORIDE 0.9 % IV SOLN: INTRAVENOUS | Status: DC

## 2024-07-12 MED ORDER — POTASSIUM CHLORIDE CRYS ER 20 MEQ PO TBCR
40.0000 meq | EXTENDED_RELEASE_TABLET | Freq: Once | ORAL | Status: AC
  Administered 2024-07-12: 40 meq via ORAL
  Filled 2024-07-12: qty 2

## 2024-07-12 MED ORDER — PHENYLEPHRINE HCL-NACL 20-0.9 MG/250ML-% IV SOLN
INTRAVENOUS | Status: DC | PRN
  Administered 2024-07-12: 20 ug/min via INTRAVENOUS

## 2024-07-12 MED ORDER — LIDOCAINE 2% (20 MG/ML) 5 ML SYRINGE
INTRAMUSCULAR | Status: DC | PRN
  Administered 2024-07-12: 100 mg via INTRAVENOUS

## 2024-07-12 NOTE — Plan of Care (Signed)

## 2024-07-12 NOTE — Anesthesia Postprocedure Evaluation (Signed)
"   Anesthesia Post Note  Patient: SHARYAH BOSTWICK  Procedure(s) Performed: TRANSESOPHAGEAL ECHOCARDIOGRAM CARDIOVERSION     Patient location during evaluation: PACU Anesthesia Type: General Level of consciousness: awake and alert Pain management: pain level controlled Vital Signs Assessment: post-procedure vital signs reviewed and stable Respiratory status: spontaneous breathing, nonlabored ventilation, respiratory function stable and patient connected to nasal cannula oxygen Cardiovascular status: blood pressure returned to baseline and stable Postop Assessment: no apparent nausea or vomiting Anesthetic complications: no   There were no known notable events for this encounter.  Last Vitals:  Vitals:   07/12/24 1430 07/12/24 1500  BP: 128/76 126/76  Pulse: 72 73  Resp: (!) 22 18  Temp: 36.6 C 36.6 C  SpO2: 97% 91%    Last Pain:  Vitals:   07/12/24 1500  TempSrc: Axillary  PainSc:                  Franky JONETTA Bald      "

## 2024-07-12 NOTE — H&P (View-Only) (Signed)
 "  Progress Note  Patient Name: Tammy Walker Date of Encounter: 07/12/2024 [facility] Cardiologist: Tammy Parchment, MD   Interval Summary   Denies any chest pain, abdominal distention, lower extremity edema, melena, hematuria, hematochezia, or shortness of breath.  Patient sitting up in bed because of back discomfort.  Vital Signs Vitals:   07/12/24 0300 07/12/24 0400 07/12/24 0420 07/12/24 0700  BP: 120/62 113/68  114/75  Pulse: 67 66  (!) 102  Resp: 19 (!) 24  14  Temp:   (!) 97.5 F (36.4 C)   TempSrc:      SpO2: 100% 100%  100%  Weight:        Intake/Output Summary (Last 24 hours) at 07/12/2024 0736 Last data filed at 07/11/2024 1917 Gross per 24 hour  Intake --  Output 500 ml  Net -500 ml      07/11/2024    2:17 AM 04/14/2024    1:27 PM 02/12/2024    3:14 PM  Last 3 Weights  Weight (lbs) 115 lb -- --  Weight (kg) 52.164 kg -- --      Telemetry/ECG  Went into what appears to be atrial flutter this morning at 6:45 AM.  Was in sinus rhythm overnight.  Ventricular rates are in the 80s to 90s- Personally Reviewed  Physical Exam  GEN: No acute distress.   Neck: No JVD Cardiac: Irregular rhythm, no murmurs, rubs, or gallops.  Respiratory: clear lungs. GI: Soft, nontender, non-distended  MS: No edema  Assessment & Plan  Tammy Walker is a 89 y.o. female with a hx of persistent atrial fibrillation, chronic HFmrEF, first-degree AV block, LBBB, aortic atherosclerosis, mitral regurgitation, CKD II, for CHF and A-fib.  Permanent atrial fibrillation In the past the patient has had difficult to control A-fib with RVR.  The patient denies that she has any symptoms with her atrial fibrillation. High-sensitivity troponins negative x 2 Informed Consent   Shared Decision Making/Informed Consent The risks (stroke, cardiac arrhythmias rarely resulting in the need for a temporary or permanent pacemaker, skin irritation or burns and complications associated with conscious  sedation including aspiration, arrhythmia, respiratory failure and death), benefits (restoration of normal sinus rhythm) and alternatives of a direct current cardioversion were explained in detail to Tammy Walker and she agrees to proceed. Continue edoxaban . Plan to transition to oral amiodarone  following cardioversion.      Acute on chronic HFmrEF No acute findings on chest x-ray. Labs showed elevated BNP. GDMT was limited by history of orthostasis and hypotension.   Hypertension Patient's blood pressures currently well-controlled.   For questions or updates, please contact [facility] Please consult [email or web address] for contact info under        Signed, Tammy Clause, PA-C   I have seen and examined the patient along with Tammy Adams, PA-C .  I have reviewed the chart, notes and new data.  I agree with PA/NP's note.  Key new complaints: Feels much better this morning.  Breathing has improved substantially.  Denies chest pain. Key examination changes: irregular rhythm this a.m. Key new findings / data: Overnight did have a period of normal sinus rhythm overnight, but back in atrial fibrillation this morning.    PLAN: Recurrent symptomatic paroxysmal atrial fibrillation, while on a very low-dose amiodarone  100 mg daily, leading to acute heart failure exacerbation in elderly patient with chronic mildly reduced ejection fraction, LBBB, limited room for heart failure therapy due to low blood pressure Transiently back in normal sinus rhythm  during intravenous amiodarone  infusion, but now back in atrial fibrillation. Plan cardioversion later today, by which time she will have received a roughly 24-hour reload with intravenous amiodarone  and we plan to discharge her on the full 200 mg p.o. amiodarone  dose.  Unfortunately there was interruption in her anticoagulation yesterday.  She is chronically on Savaysa , but this was not administered yesterday in the emergency room.  Will proceed  to the planned cardioversion with a TEE.  Informed Consent   Shared Decision Making/Informed Consent   The risks [stroke, cardiac arrhythmias rarely resulting in the need for a temporary or permanent pacemaker, skin irritation or burns, esophageal damage, perforation (1:10,000 risk), bleeding, pharyngeal hematoma as well as other potential complications associated with conscious sedation including aspiration, arrhythmia, respiratory failure and death], benefits (treatment guidance, restoration of normal sinus rhythm, diagnostic support) and alternatives of a transesophageal echocardiogram guided cardioversion were discussed in detail with Tammy Walker and she is willing to proceed.   Clinically she appears euvolemic.  Will not give any additional intravenous diuretics.  Tammy Ternes, MD, FACC [facility] [phone] 07/12/2024, 9:16 AM   "

## 2024-07-12 NOTE — Progress Notes (Signed)
 Arrived back to unit after TEE. Vitals obtained.     07/12/24 1430  Vitals  Temp 97.9 F (36.6 C)  Temp Source Axillary  BP 128/76  MAP (mmHg) 92  BP Location Right Arm  BP Method Automatic  Patient Position (if appropriate) Lying  Pulse Rate 72  Pulse Rate Source Monitor  ECG Heart Rate 81  Resp (!) 22  Level of Consciousness  Level of Consciousness Alert  MEWS COLOR  MEWS Score Color Green  Oxygen Therapy  SpO2 97 %  O2 Device Nasal Cannula  O2 Flow Rate (L/min) 3 L/min  Pain Assessment  Pain Scale 0-10  Pain Score 0  MEWS Score  MEWS Temp 0  MEWS Systolic 0  MEWS Pulse 0  MEWS RR 1  MEWS LOC 0  MEWS Score 1

## 2024-07-12 NOTE — Progress Notes (Signed)
 " Progress Note    Tammy Walker   FMW:[id number]  DOB: 1931/03/16  DOA: 07/11/2024     1 PCP: Tammy Therisa MATSU, PA  Initial CC: Palpitations, lower extremity swelling  Hospital Course: Tammy Walker is a 89 y.o. female with PMH chronic dCHF, permanent atrial fibrillation on chronic anticoagulation, left bundle branch block, hypertension, hyperlipidemia, osteoporosis, GERD who presented with severe chest pain and shortness of breath.  History is obtained with assistance of her daughter who is present at bedside due to the patient being very hard of hearing.   She has experienced chest pain for the last 10-12 years, localized to the sternum. The pain occasionally intensifies, described as 'stabbing' and sometimes reaching a severity of 15 on a scale of 1 to 10. She uses a heated rice bag and arthritis cream for relief, though the effectiveness is uncertain.   Last night, she had an episode of severe left-sided chest pain accompanied by difficulty breathing. Her daughter noted that her blood pressure was elevated during this episode. She has been experiencing weight gain over the past few weeks, though not a sudden increase. Her weight was stable at 112 pounds for a long time before this recent gain. She also experienced swelling in her ankles.  Daughter notes that she had previously been on Lasix  20 mg daily, but this was discontinued back in June.    She has a history of extreme curvature of the spine and broken vertebrae, contributing to chronic back pain. Her pain management includes extended-release oxycodone  10 mg twice daily, pregabalin  75 mg in the morning and 25 mg in the afternoon and evening, and oxycodone  IR 5 mg as needed.   In the ER she was found to be in A-fib with RVR with rates in the 120s.  She was placed initially on BiPAP then transitioned to nasal cannula. proBNP elevated.  She was started on Lasix  and cardiology consulted after admission.   Assessment and Plan   Acute  respiratory distress Acute on chronic systolic CHF - Acute on chronic.  Patient presented with acute onset of chest pain with reports of shortness of breath.   - Does not appear to have been documented hypoxic, but was temporarily placed on BiPAP due to work of breathing.  Notes increase in weight over the last couple weeks as well as leg swelling.  proBNP elevated at 1459 - High-sensitivity troponins were negative x 2 and EKG did not show any significant ischemic changes - Last echocardiogram noted EF to be 45 to 50% with normal right ventricular function and indeterminate diastolic parameters when last checked 10/17/2022.  Patient had been given Lasix  40 mg IV in the emergency department with rapid urine output and improvement in LE edema - Echo obtained on admission showing EF 35 to 40%, global hypokinesis, moderate asymmetric LVH, elevated left atrial pressure, severely dilated LA and RA - Heart function possibly lowered in setting of rapid A-fib; suspect will need repeat echo outpatient  Permanent afib with RVR - Has declined ablation and pacemaker in the past -Also had developed some iron deposits on amiodarone  in the past and was switched to Multaq ; she has since been changed back to low-dose amiodarone  and follows with A-fib clinic -Placed on amiodarone  drip on admission; transitioning to oral as per cardiology -Has been compliant on edoxaban ; missed dose on 07/11/2024 in the ER due to ?med unavailability; therefore was recommended to undergo TEE prior to cardioversion on 1/13.  This did show small mobile thrombus  in the left atrial appendage, therefore cardioversion was not performed; Edoxaban  has since been resumed   Hypertensive urgency Initial blood pressures elevated up to 164/108.  Patient had been given Lasix  as well as nitroglycerin  with improvement in blood pressure.   - continue PRN meds    Chronic pain Patient has a history of chronic pain related to her back as well as sternal  pain. - Continue OxyContin , Lyrica , oxycodone  as needed, and current bowel regimen   Hyperlipidemia - Continue atorvastatin    Subclinical hypothyroidism TSH noted to be elevated at 4.57 - FT4 normal, 1.48   Hiatal hernia She is not on any antiacid medications for treatment.  Interval History:  Resting comfortably in bed this morning with daughter present bedside.  Hard of hearing but understands well if talking loudly next to her.  Denies any chest pain or palpitations.   Antimicrobials:   Consultants:  Cardiology  Procedures:  07/12/2024: TEE, no cardioversion performed due to left atrial thrombus  DVT prophylaxis:  SCDs Start: 07/11/24 0531 edoxaban  (SAVAYSA ) tablet 30 mg   Code Status:   Code Status: Do not attempt resuscitation (DNR) PRE-ARREST INTERVENTIONS DESIRED  Barriers to discharge: none Therapy evaluation: PT Orders: Active   PT Follow up Rec:   Disposition Plan:  Home  Status is: Inpt  Mobility Assessment (Last 72 Hours)     Mobility Assessment     Row Name 07/12/24 0945           Does the patient have exclusion criteria? No- Perform mobility assessment       What is the highest level of mobility based on the mobility assessment? Level 4 (Ambulates with assistance) - Balance while stepping forward/back - Complete          Diet: Diet Orders (From admission, onward)     Start     Ordered   07/12/24 1437  Diet regular Fluid consistency: Thin  Diet effective now       Question:  Fluid consistency:  Answer:  Thin   07/12/24 1436            Objective: Blood pressure 126/76, pulse 73, temperature 97.9 F (36.6 C), temperature source Axillary, resp. rate 18, height 4' 11 (1.499 m), weight 52.2 kg, SpO2 91%.  Examination:  Physical Exam Constitutional:      Appearance: Normal appearance.     Comments: Hard of hearing  HENT:     Head: Normocephalic and atraumatic.     Mouth/Throat:     Mouth: Mucous membranes are moist.  Eyes:      Extraocular Movements: Extraocular movements intact.  Cardiovascular:     Rate and Rhythm: Normal rate. Rhythm irregular.  Pulmonary:     Effort: Pulmonary effort is normal. No respiratory distress.     Breath sounds: Normal breath sounds. No wheezing.  Abdominal:     General: Bowel sounds are normal. There is no distension.     Palpations: Abdomen is soft.     Tenderness: There is no abdominal tenderness.  Musculoskeletal:        General: Normal range of motion.     Cervical back: Normal range of motion and neck supple.  Skin:    General: Skin is warm and dry.  Neurological:     General: No focal deficit present.     Mental Status: She is alert.  Psychiatric:        Mood and Affect: Mood normal.      Data Reviewed: Results for orders  placed or performed during the hospital encounter of 07/11/24 (from the past 24 hours)  Basic metabolic panel     Status: Abnormal   Collection Time: 07/12/24  4:19 AM  Result Value Ref Range   Sodium 136 135 - 145 mmol/L   Potassium 3.5 3.5 - 5.1 mmol/L   Chloride 96 (L) 98 - 111 mmol/L   CO2 32 22 - 32 mmol/L   Glucose, Bld 125 (H) 70 - 99 mg/dL   BUN 14 8 - 23 mg/dL   Creatinine, Ser 9.24 0.44 - 1.00 mg/dL   Calcium  8.3 (L) 8.9 - 10.3 mg/dL   GFR, Estimated >39 >39 mL/min   Anion gap 8 5 - 15  Magnesium     Status: None   Collection Time: 07/12/24  4:19 AM  Result Value Ref Range   Magnesium 2.0 1.7 - 2.4 mg/dL    I have reviewed pertinent nursing notes, vitals, labs, and images as necessary. I have ordered labwork to follow up on as indicated.  I have reviewed the last notes from staff over past 24 hours. I have discussed patient's care plan and test results with nursing staff, CM/SW, and other staff as appropriate.  Old records reviewed in assessment of this patient  Time spent: Greater than 50% of the 55 minute visit was spent in counseling/coordination of care for the patient as laid out in the A&P.   LOS: 1 day   Alm Apo, MD [facility] 07/12/2024, 4:18 PM "

## 2024-07-12 NOTE — Progress Notes (Signed)
BiPAP not indicated at this time. 

## 2024-07-12 NOTE — Progress Notes (Addendum)
 "  Progress Note  Patient Name: Tammy Walker Date of Encounter: 07/12/2024 [facility] Cardiologist: Oneil Parchment, MD   Interval Summary   Denies any chest pain, abdominal distention, lower extremity edema, melena, hematuria, hematochezia, or shortness of breath.  Patient sitting up in bed because of back discomfort.  Vital Signs Vitals:   07/12/24 0300 07/12/24 0400 07/12/24 0420 07/12/24 0700  BP: 120/62 113/68  114/75  Pulse: 67 66  (!) 102  Resp: 19 (!) 24  14  Temp:   (!) 97.5 F (36.4 C)   TempSrc:      SpO2: 100% 100%  100%  Weight:        Intake/Output Summary (Last 24 hours) at 07/12/2024 0736 Last data filed at 07/11/2024 1917 Gross per 24 hour  Intake --  Output 500 ml  Net -500 ml      07/11/2024    2:17 AM 04/14/2024    1:27 PM 02/12/2024    3:14 PM  Last 3 Weights  Weight (lbs) 115 lb -- --  Weight (kg) 52.164 kg -- --      Telemetry/ECG  Went into what appears to be atrial flutter this morning at 6:45 AM.  Was in sinus rhythm overnight.  Ventricular rates are in the 80s to 90s- Personally Reviewed  Physical Exam  GEN: No acute distress.   Neck: No JVD Cardiac: Irregular rhythm, no murmurs, rubs, or gallops.  Respiratory: clear lungs. GI: Soft, nontender, non-distended  MS: No edema  Assessment & Plan  Tammy Walker is a 89 y.o. female with a hx of persistent atrial fibrillation, chronic HFmrEF, first-degree AV block, LBBB, aortic atherosclerosis, mitral regurgitation, CKD II, for CHF and A-fib.  Permanent atrial fibrillation In the past the patient has had difficult to control A-fib with RVR.  The patient denies that she has any symptoms with her atrial fibrillation. High-sensitivity troponins negative x 2 Informed Consent   Shared Decision Making/Informed Consent The risks (stroke, cardiac arrhythmias rarely resulting in the need for a temporary or permanent pacemaker, skin irritation or burns and complications associated with conscious  sedation including aspiration, arrhythmia, respiratory failure and death), benefits (restoration of normal sinus rhythm) and alternatives of a direct current cardioversion were explained in detail to Tammy Walker and she agrees to proceed. Continue edoxaban . Plan to transition to oral amiodarone  following cardioversion.      Acute on chronic HFmrEF No acute findings on chest x-ray. Labs showed elevated BNP. GDMT was limited by history of orthostasis and hypotension.   Hypertension Patient's blood pressures currently well-controlled.   For questions or updates, please contact [facility] Please consult [email or web address] for contact info under        Signed, Morse Clause, PA-C   I have seen and examined the patient along with Zane Adams, PA-C .  I have reviewed the chart, notes and new data.  I agree with PA/NP's note.  Key new complaints: Feels much better this morning.  Breathing has improved substantially.  Denies chest pain. Key examination changes: irregular rhythm this a.m. Key new findings / data: Overnight did have a period of normal sinus rhythm overnight, but back in atrial fibrillation this morning.    PLAN: Recurrent symptomatic paroxysmal atrial fibrillation, while on a very low-dose amiodarone  100 mg daily, leading to acute heart failure exacerbation in elderly patient with chronic mildly reduced ejection fraction, LBBB, limited room for heart failure therapy due to low blood pressure Transiently back in normal sinus rhythm  during intravenous amiodarone  infusion, but now back in atrial fibrillation. Plan cardioversion later today, by which time she will have received a roughly 24-hour reload with intravenous amiodarone  and we plan to discharge her on the full 200 mg p.o. amiodarone  dose.  Unfortunately there was interruption in her anticoagulation yesterday.  She is chronically on Savaysa , but this was not administered yesterday in the emergency room.  Will proceed  to the planned cardioversion with a TEE.  Informed Consent   Shared Decision Making/Informed Consent   The risks [stroke, cardiac arrhythmias rarely resulting in the need for a temporary or permanent pacemaker, skin irritation or burns, esophageal damage, perforation (1:10,000 risk), bleeding, pharyngeal hematoma as well as other potential complications associated with conscious sedation including aspiration, arrhythmia, respiratory failure and death], benefits (treatment guidance, restoration of normal sinus rhythm, diagnostic support) and alternatives of a transesophageal echocardiogram guided cardioversion were discussed in detail with Tammy Walker and she is willing to proceed.   Clinically she appears euvolemic.  Will not give any additional intravenous diuretics.  Cleave Ternes, MD, FACC [facility] [phone] 07/12/2024, 9:16 AM   "

## 2024-07-12 NOTE — Progress Notes (Signed)
 Transported off unit at this time for Cath Lab.

## 2024-07-12 NOTE — Progress Notes (Signed)
 After TEE patient has concerns for sore throat after procedure.  Utilized general admission PRN standing orders for PRN phenol spray.

## 2024-07-12 NOTE — Progress Notes (Signed)
 Admitted from Emergency Department to 3E Room 3E06 Placed on cardiac monitor and vitals obtained.  Oriented to room, unit routine, and plan of care.    07/12/24 0906  Vitals  Temp (!) 97.5 F (36.4 C)  Temp Source Oral  BP 98/82  MAP (mmHg) 88  BP Location Left Arm  BP Method Automatic  Patient Position (if appropriate) Sitting  Pulse Rate 81  Pulse Rate Source Monitor  ECG Heart Rate 82  Resp (!) 21  Level of Consciousness  Level of Consciousness Alert  MEWS COLOR  MEWS Score Color Yellow  Oxygen Therapy  SpO2 97 %  O2 Device Nasal Cannula  O2 Flow Rate (L/min) 3 L/min  Pain Assessment  Pain Scale 0-10  Pain Score 4  ECG Monitoring  Telemetry Box Number [id number] Hardwired  Tele Box Verification Completed by Second Verifier Completed  MEWS Score  MEWS Temp 0  MEWS Systolic 1  MEWS Pulse 0  MEWS RR 1  MEWS LOC 0  MEWS Score 2

## 2024-07-12 NOTE — Progress Notes (Signed)
" °  Echocardiogram Echocardiogram Transesophageal has been performed.  Koleen KANDICE Popper, RDCS 07/12/2024, 1:54 PM "

## 2024-07-12 NOTE — Anesthesia Preprocedure Evaluation (Signed)
"                                    Anesthesia Evaluation  Patient identified by MRN, date of birth, ID band Patient awake    Reviewed: Allergy & Precautions, NPO status , Patient's Chart, lab work & pertinent test results  Airway Mallampati: II  TM Distance: >3 FB Neck ROM: Full    Dental  (+) Teeth Intact, Dental Advisory Given   Pulmonary    breath sounds clear to auscultation       Cardiovascular hypertension, +CHF  + dysrhythmias Atrial Fibrillation  Rhythm:Irregular Rate:Abnormal     Neuro/Psych  Neuromuscular disease  negative psych ROS   GI/Hepatic Neg liver ROS, hiatal hernia,GERD  ,,  Endo/Other  Hypothyroidism    Renal/GU negative Renal ROS     Musculoskeletal negative musculoskeletal ROS (+)    Abdominal   Peds  Hematology negative hematology ROS (+)   Anesthesia Other Findings   Reproductive/Obstetrics                              Anesthesia Physical Anesthesia Plan  ASA: 3  Anesthesia Plan: General   Post-op Pain Management: Minimal or no pain anticipated   Induction: Intravenous  PONV Risk Score and Plan: 0 and Propofol  infusion  Airway Management Planned: Natural Airway and Simple Face Mask  Additional Equipment: None  Intra-op Plan:   Post-operative Plan:   Informed Consent: I have reviewed the patients History and Physical, chart, labs and discussed the procedure including the risks, benefits and alternatives for the proposed anesthesia with the patient or authorized representative who has indicated his/her understanding and acceptance.       Plan Discussed with: CRNA  Anesthesia Plan Comments:         Anesthesia Quick Evaluation  "

## 2024-07-12 NOTE — Hospital Course (Addendum)
 Tammy Walker is a 89 y.o. female with PMH chronic dCHF, permanent atrial fibrillation on chronic anticoagulation, left bundle branch block, hypertension, hyperlipidemia, osteoporosis, GERD who presented with severe chest pain and shortness of breath.  History is obtained with assistance of her daughter who is present at bedside due to the patient being very hard of hearing.   She has experienced chest pain for the last 10-12 years, localized to the sternum. The pain occasionally intensifies, described as 'stabbing' and sometimes reaching a severity of 15 on a scale of 1 to 10. She uses a heated rice bag and arthritis cream for relief, though the effectiveness is uncertain.   Last night, she had an episode of severe left-sided chest pain accompanied by difficulty breathing. Her daughter noted that her blood pressure was elevated during this episode. She has been experiencing weight gain over the past few weeks, though not a sudden increase. Her weight was stable at 112 pounds for a long time before this recent gain. She also experienced swelling in her ankles.  Daughter notes that she had previously been on Lasix  20 mg daily, but this was discontinued back in June.    She has a history of extreme curvature of the spine and broken vertebrae, contributing to chronic back pain. Her pain management includes extended-release oxycodone  10 mg twice daily, pregabalin  75 mg in the morning and 25 mg in the afternoon and evening, and oxycodone  IR 5 mg as needed.   In the ER she was found to be in A-fib with RVR with rates in the 120s.  She was placed initially on BiPAP then transitioned to nasal cannula. proBNP elevated.  She was started on Lasix  and cardiology consulted after admission.   Assessment and Plan   Acute respiratory distress Acute on chronic systolic CHF - Acute on chronic.  Patient presented with acute onset of chest pain with reports of shortness of breath.   - Does not appear to have been  documented hypoxic, but was temporarily placed on BiPAP due to work of breathing.  Notes increase in weight over the last couple weeks as well as leg swelling.  proBNP elevated at 1459 - High-sensitivity troponins were negative x 2 and EKG did not show any significant ischemic changes - Last echocardiogram noted EF to be 45 to 50% with normal right ventricular function and indeterminate diastolic parameters when last checked 10/17/2022.  Patient had been given Lasix  40 mg IV in the emergency department with rapid urine output and improvement in LE edema - Echo obtained on admission showing EF 35 to 40%, global hypokinesis, moderate asymmetric LVH, elevated left atrial pressure, severely dilated LA and RA - Heart function possibly lowered in setting of rapid A-fib; suspect will need repeat echo outpatient  Permanent afib with RVR - Has declined ablation and pacemaker in the past -Also had developed some iron deposits on amiodarone  in the past and was switched to Multaq ; she has since been changed back to low-dose amiodarone  and follows with A-fib clinic -Placed on amiodarone  drip on admission; transitioning to oral as per cardiology -Has been compliant on edoxaban ; missed dose on 07/11/2024 in the ER due to ?med unavailability; therefore was recommended to undergo TEE prior to cardioversion on 1/13. Potentially showed thrombus in LAA but with further review by cardiology may not be accurate (however at the time, cardioversion was not performed during TEE) - while on IV amio after TEE, she converted back to NSR overnight and remained in NSR on 1/15; she  was transitioned to oral amio at higher dose, 200 mg daily at discharge    Hypertensive urgency - resolved  Initial blood pressures elevated up to 164/108.  Patient had been given Lasix  as well as nitroglycerin  with improvement in blood pressure.   - Lasix  continued at discharge 3 times weekly   Chronic pain Patient has a history of chronic pain related  to her back as well as sternal pain. - Continue OxyContin , Lyrica , oxycodone  as needed, and current bowel regimen   Hyperlipidemia - Continue atorvastatin    Subclinical hypothyroidism TSH noted to be elevated at 4.57 - FT4 normal, 1.48   Hiatal hernia She is not on any antiacid medications for treatment.

## 2024-07-12 NOTE — Interval H&P Note (Signed)
 History and Physical Interval Note:  07/12/2024 12:51 PM  Tammy Walker  has presented today for surgery, with the diagnosis of Afib.  The various methods of treatment have been discussed with the patient and family. After consideration of risks, benefits and other options for treatment, the patient has consented to  Procedures: TRANSESOPHAGEAL ECHOCARDIOGRAM (N/A) CARDIOVERSION (N/A) as a surgical intervention.  The patient's history has been reviewed, patient examined, no change in status, stable for surgery.  I have reviewed the patient's chart and labs.  Questions were answered to the patient's satisfaction.     Annabella Scarce, MD

## 2024-07-12 NOTE — Transfer of Care (Signed)
 Immediate Anesthesia Transfer of Care Note  Patient: Tammy Walker  Procedure(s) Performed: TRANSESOPHAGEAL ECHOCARDIOGRAM CARDIOVERSION  Patient Location: Cath Lab  Anesthesia Type:MAC  Level of Consciousness: awake, alert , and oriented  Airway & Oxygen Therapy: Patient Spontanous Breathing and Patient connected to nasal cannula oxygen  Post-op Assessment: Report given to RN and Post -op Vital signs reviewed and stable  Post vital signs: Reviewed and stable  Last Vitals:  Vitals Value Taken Time  BP 110/48 07/12/24 13:45  Temp 36 1345  Pulse 97 07/12/24 13:47  Resp 12 07/12/24 13:47  SpO2 95 % 07/12/24 13:47  Vitals shown include unfiled device data.  Last Pain:  Vitals:   07/12/24 1343  TempSrc: Temporal  PainSc: Asleep         Complications: No notable events documented.

## 2024-07-12 NOTE — TOC CM/SW Note (Addendum)
 Transition of Care [facility]) - Inpatient Brief Assessment   Patient Details  Name: Tammy Walker MRN: [id number] Date of Birth: 06/26/1931  [facility] CM/SW Contact:    Waddell Barnie Rama, RN Phone Number: 07/12/2024, 4:42 PM   Clinical Narrative: From home with daughter (POA), has PCP and insurance on file, states has no HH services in place at this time,has rollator, shower chair and transport chair at home.  States family member  (daughter) will transport them home at [employer] and family is support system, states gets medications from [place] on [address], but is ok with [facility] pharmacy filling meds at dc.  Pta self ambulatory with rollator.   There are no ICM needs identified  at this time.  Please place consult for ICM needs.  Conts on amio drip, iv lasix , will wait before put in PT eval.   Transition of Care Asessment: Insurance and Status: Insurance coverage has been reviewed Patient has primary care physician: Yes Home environment has been reviewed: home with daughter Prior level of function:: ambulatory with rollator Prior/Current Home Services: Current home services (rollator, transport chair, shower chair) Social Drivers of Health Review: SDOH reviewed no interventions necessary Readmission risk has been reviewed: Yes Transition of care needs: no transition of care needs at this time

## 2024-07-12 NOTE — Progress Notes (Signed)
 MEWS Progress Note  Patient Details Name: FIFI SCHINDLER MRN: [id number] DOB: [birth year] Today's Date: 07/12/2024   MEWS Flowsheet Documentation:  Assess: MEWS Score Temp: (!) 97.5 F (36.4 C) BP: 98/82 MAP (mmHg): 88 Pulse Rate: 81 ECG Heart Rate: 82 Resp: (!) 21 Level of Consciousness: Alert SpO2: 97 % O2 Device: Nasal Cannula O2 Flow Rate (L/min): 3 L/min FiO2 (%): 50 % Assess: MEWS Score MEWS Temp: 0 MEWS Systolic: 1 MEWS Pulse: 0 MEWS RR: 1 MEWS LOC: 0 MEWS Score: 2 MEWS Score Color: Yellow Assess: SIRS CRITERIA SIRS Temperature : 0 SIRS Respirations : 1 SIRS Pulse: 0 SIRS WBC: 0 SIRS Score Sum : 1 SIRS Temperature : 0 SIRS Pulse: 0 SIRS Respirations : 1 SIRS WBC: 0 SIRS Score Sum : 1 Assess: if the MEWS score is Yellow or Red Were vital signs accurate and taken at a resting state?: Yes Does the patient meet 2 or more of the SIRS criteria?: No MEWS guidelines implemented : Yes, yellow Treat MEWS Interventions: Considered administering scheduled or prn medications/treatments as ordered Take Vital Signs Increase Vital Sign Frequency : Yellow: Q2hr x1, continue Q4hrs until patient remains green for 12hrs Escalate MEWS: Escalate: Yellow: Discuss with charge nurse and consider notifying provider and/or RRT Notify: Charge Nurse/RN Name of Charge Nurse/RN Notified: Charolette RN    Niel Porter ORN 07/12/2024, 9:28 AM

## 2024-07-12 NOTE — CV Procedure (Signed)
 Brief TEE Note  LVEF 35%.  Global hypokinesis. Large hiatal hernia with limited windows. Small mobile thrombus in the LAA thrombus.   DCCV was not performed.  For additional details see full report.   [name] C. Raford, MD, [facility] 07/12/2024 1:41 PM

## 2024-07-13 ENCOUNTER — Encounter: Payer: Self-pay | Admitting: Cardiology

## 2024-07-13 DIAGNOSIS — I5043 Acute on chronic combined systolic (congestive) and diastolic (congestive) heart failure: Secondary | ICD-10-CM | POA: Diagnosis not present

## 2024-07-13 DIAGNOSIS — I4821 Permanent atrial fibrillation: Secondary | ICD-10-CM | POA: Diagnosis not present

## 2024-07-13 DIAGNOSIS — I160 Hypertensive urgency: Secondary | ICD-10-CM | POA: Diagnosis not present

## 2024-07-13 LAB — BASIC METABOLIC PANEL WITH GFR
Anion gap: 6 (ref 5–15)
BUN: 9 mg/dL (ref 8–23)
CO2: 31 mmol/L (ref 22–32)
Calcium: 8.1 mg/dL — ABNORMAL LOW (ref 8.9–10.3)
Chloride: 100 mmol/L (ref 98–111)
Creatinine, Ser: 0.68 mg/dL (ref 0.44–1.00)
GFR, Estimated: >60 mL/min
Glucose, Bld: 85 mg/dL (ref 70–99)
Potassium: 4 mmol/L (ref 3.5–5.1)
Sodium: 138 mmol/L (ref 135–145)

## 2024-07-13 MED ORDER — AMIODARONE HCL 200 MG PO TABS: 200.0000 mg | ORAL_TABLET | Freq: Every day | ORAL | Status: DC

## 2024-07-13 MED ORDER — FUROSEMIDE 20 MG PO TABS: 20.0000 mg | ORAL_TABLET | ORAL | Status: DC

## 2024-07-13 MED ORDER — AMIODARONE HCL 200 MG PO TABS
200.0000 mg | ORAL_TABLET | Freq: Every day | ORAL | Status: DC
  Administered 2024-07-13: 200 mg via ORAL
  Filled 2024-07-13: qty 1

## 2024-07-13 NOTE — Progress Notes (Addendum)
"  °  Progress Note  Patient Name: Tammy Walker Date of Encounter: 07/13/2024 [facility] Cardiologist: Oneil Parchment, MD   Interval Summary   Has a sore throat from yesterday's TEE, otherwise no complaints.  Converted to sinus rhythm overnight and is maintaining it for last several hours.  Vital Signs Vitals:   07/13/24 0502 07/13/24 0503 07/13/24 0504 07/13/24 0505  BP:      Pulse:      Resp:      Temp:      TempSrc:      SpO2: (!) 85% (!) 87% (!) 82% 97%  Weight:      Height:        Intake/Output Summary (Last 24 hours) at 07/13/2024 0944 Last data filed at 07/12/2024 1430 Gross per 24 hour  Intake 674.57 ml  Output 400 ml  Net 274.57 ml      07/13/2024    3:42 AM 07/12/2024   11:58 AM 07/11/2024    2:17 AM  Last 3 Weights  Weight (lbs) 116 lb 8 oz 115 lb 115 lb  Weight (kg) 52.844 kg 52.164 kg 52.164 kg      Telemetry/ECG  Normal sinus rhythm- Personally Reviewed  Physical Exam  GEN: No acute distress.   Neck: 4-5 cm JVD Cardiac: RRR, no murmurs, rubs, or gallops.  Respiratory: Clear to auscultation bilaterally. GI: Soft, nontender, non-distended  MS: No edema  Assessment & Plan   Has some mild sore throat from yesterday's TEE.  Recommend symptom relief with benzocaine containing lozenges such as Cepacol or Chloraseptic.  If symptoms do not resolve over the weekend, may need further evaluation.  Continue uninterrupted anticoagulation for the next 30 days.  Discussed daily weight monitoring and weight/symptom based dosing of the furosemide .  For now I think she should take it 3 days a week.  Daily diuretic led to symptomatic hypotension.  Difficult TEE due to a large hiatal hernia and limited windows.  Avoid TEE in the future if possible.  [facility] will sign off.   The patient is ready for discharge today from a cardiac standpoint. Medication Recommendations:   Amiodarone  200 mg daily (new dose) Atorvastatin  20 mg daily Savaysa  30 mg  daily Furosemide  20 mg three times weekly OTC benzocaine containing lozenges such as Chloraseptic, Cepacol, etc Other recommendations (labs, testing, etc): Daily weights, call MD for weight gain 3 pounds / 24 hours or 5 pound/week.  Sodium restricted diet.  Bring weight log to office appointments.  Continue monitoring for A-fib recurrence with her personal ECG device Darris). Follow up as an outpatient: Has an appointment with Dr. Oneil Parchment scheduled 07/15/2023 For questions or updates, please contact [facility] Please consult [email or web address] for contact info under         Signed, Jerel Balding, MD   "

## 2024-07-13 NOTE — Discharge Summary (Signed)
 " Physician Discharge Summary   Tammy Walker FMW:[id number] DOB: [birth year] DOA: 07/11/2024  PCP: Tammy Therisa MATSU, PA  Admit date: 07/11/2024 Discharge date: 07/13/2024  Admitted From: Home Disposition:  Home Discharging physician: Alm Apo, MD Barriers to discharge: none  Recommendations at discharge: Consider repeat per cardiology timing while in NSR   Discharge Condition: stable CODE STATUS: DNR Diet recommendation:  Diet Orders (From admission, onward)     Start     Ordered   07/13/24 0000  Diet general        07/13/24 1232   07/12/24 1437  Diet regular Fluid consistency: Thin  Diet effective now       Question:  Fluid consistency:  Answer:  Thin   07/12/24 1436            Hospital Course: Tammy Walker is a 89 y.o. female with PMH chronic dCHF, permanent atrial fibrillation on chronic anticoagulation, left bundle branch block, hypertension, hyperlipidemia, osteoporosis, GERD who presented with severe chest pain and shortness of breath.  History is obtained with assistance of her daughter who is present at bedside due to the patient being very hard of hearing.   She has experienced chest pain for the last 10-12 years, localized to the sternum. The pain occasionally intensifies, described as 'stabbing' and sometimes reaching a severity of 15 on a scale of 1 to 10. She uses a heated rice bag and arthritis cream for relief, though the effectiveness is uncertain.   Last night, she had an episode of severe left-sided chest pain accompanied by difficulty breathing. Her daughter noted that her blood pressure was elevated during this episode. She has been experiencing weight gain over the past few weeks, though not a sudden increase. Her weight was stable at 112 pounds for a long time before this recent gain. She also experienced swelling in her ankles.  Daughter notes that she had previously been on Lasix  20 mg daily, but this was discontinued back in June.    She has a history  of extreme curvature of the spine and broken vertebrae, contributing to chronic back pain. Her pain management includes extended-release oxycodone  10 mg twice daily, pregabalin  75 mg in the morning and 25 mg in the afternoon and evening, and oxycodone  IR 5 mg as needed.   In the ER she was found to be in A-fib with RVR with rates in the 120s.  She was placed initially on BiPAP then transitioned to nasal cannula. proBNP elevated.  She was started on Lasix  and cardiology consulted after admission.   Assessment and Plan   Acute respiratory distress Acute on chronic systolic CHF - Acute on chronic.  Patient presented with acute onset of chest pain with reports of shortness of breath.   - Does not appear to have been documented hypoxic, but was temporarily placed on BiPAP due to work of breathing.  Notes increase in weight over the last couple weeks as well as leg swelling.  proBNP elevated at 1459 - High-sensitivity troponins were negative x 2 and EKG did not show any significant ischemic changes - Last echocardiogram noted EF to be 45 to 50% with normal right ventricular function and indeterminate diastolic parameters when last checked 10/17/2022.  Patient had been given Lasix  40 mg IV in the emergency department with rapid urine output and improvement in LE edema - Echo obtained on admission showing EF 35 to 40%, global hypokinesis, moderate asymmetric LVH, elevated left atrial pressure, severely dilated LA and RA - Heart  function possibly lowered in setting of rapid A-fib; suspect will need repeat echo outpatient  Permanent afib with RVR - Has declined ablation and pacemaker in the past -Also had developed some iron deposits on amiodarone  in the past and was switched to Multaq ; she has since been changed back to low-dose amiodarone  and follows with A-fib clinic -Placed on amiodarone  drip on admission; transitioning to oral as per cardiology -Has been compliant on edoxaban ; missed dose on 07/11/2024  in the ER due to ?med unavailability; therefore was recommended to undergo TEE prior to cardioversion on 1/13. Potentially showed thrombus in LAA but with further review by cardiology may not be accurate (however at the time, cardioversion was not performed during TEE) - while on IV amio after TEE, she converted back to NSR overnight and remained in NSR on 1/15; she was transitioned to oral amio at higher dose, 200 mg daily at discharge    Hypertensive urgency - resolved  Initial blood pressures elevated up to 164/108.  Patient had been given Lasix  as well as nitroglycerin  with improvement in blood pressure.   - Lasix  continued at discharge 3 times weekly   Chronic pain Patient has a history of chronic pain related to her back as well as sternal pain. - Continue OxyContin , Lyrica , oxycodone  as needed, and current bowel regimen   Hyperlipidemia - Continue atorvastatin    Subclinical hypothyroidism TSH noted to be elevated at 4.57 - FT4 normal, 1.48   Hiatal hernia She is not on any antiacid medications for treatment.   The patient's acute and chronic medical conditions were treated accordingly. On day of discharge, patient was felt deemed stable for discharge. Patient/family member advised to call PCP or come back to ER if needed.   Principal Diagnosis: Permanent atrial fibrillation with RVR [facility])  Discharge Diagnoses: Active Hospital Problems   Diagnosis Date Noted   Permanent atrial fibrillation with RVR (HCC) 07/12/2024    Priority: 1.   Hypertensive urgency 07/11/2024    Priority: 2.   Acute on chronic combined systolic and diastolic CHF (congestive heart failure) (HCC) 07/11/2024    Priority: 2.   Chronic anticoagulation 07/11/2024    Priority: 3.   Persistent atrial fibrillation (HCC) 06/25/2012    Priority: 3.   Chronic low back pain 07/29/2019    Priority: 4.   Hyperlipidemia 07/11/2024    Priority: 5.   Subclinical hypothyroidism 07/11/2024    Priority: 6.   Hiatal  hernia 09/06/2021    Priority: 7.   Heart failure with mildly reduced ejection fraction (HCC) 07/11/2024   LBBB (left bundle branch block) 04/02/2021    Resolved Hospital Problems  No resolved problems to display.     Discharge Instructions     Diet general   Complete by: As directed    Increase activity slowly   Complete by: As directed       Allergies as of 07/13/2024   No Known Allergies      Medication List     TAKE these medications    acetaminophen  500 MG tablet Commonly known as: TYLENOL  2 tablets in the AM, 1 tablet in the afternoon, and 1 tablet at night Orally three times a day What changed: See the new instructions.   amiodarone  200 MG tablet Commonly known as: PACERONE  Take 1 tablet (200 mg total) by mouth daily. What changed: how much to take   atorvastatin  20 MG tablet Commonly known as: LIPITOR TAKE 1 TABLET(20 MG) BY MOUTH AT BEDTIME   bisacodyl  5  MG EC tablet Commonly known as: DULCOLAX Take 10 mg by mouth at bedtime.   carboxymethylcellulose 0.5 % Soln Commonly known as: REFRESH PLUS Place 1 drop into both eyes every 4 (four) hours as needed (eye irritatation).   cetirizine 10 MG tablet Commonly known as: ZYRTEC Take 10 mg by mouth daily.   CHEWABLE CALCIUM /D3 PO Take 2 each by mouth 2 (two) times daily with a meal. Vitafusion; calcium  + D3 gummies   CALCIUM  + VITAMIN D3 PO Take 1 tablet by mouth 2 (two) times daily with a meal.   clotrimazole 1 % cream Commonly known as: LOTRIMIN Apply 1 application  topically 2 (two) times daily as needed (skin irritation).   denosumab  60 MG/ML Sosy injection Commonly known as: PROLIA  Inject 60 mg into the skin every 6 (six) months.   diclofenac  Sodium 1 % Gel Commonly known as: VOLTAREN  Apply 1 Application topically 4 (four) times daily as needed (pain).   edoxaban  30 MG Tabs tablet Commonly known as: Savaysa  Take 1 tablet (30 mg total) by mouth daily. What changed: when to take this    furosemide  20 MG tablet Commonly known as: Lasix  Take 1 tablet (20 mg total) by mouth 3 (three) times a week.   hydrocortisone  2.5 % rectal cream Commonly known as: ANUSOL -HC Apply 1 Application topically 2 (two) times daily as needed for hemorrhoids (rectal pain).   ipratropium 0.03 % nasal spray Commonly known as: ATROVENT  Place 2 sprays into both nostrils every 12 (twelve) hours.   Linzess 72 MCG capsule Generic drug: linaclotide Take 72 mcg by mouth daily as needed. What changed: reasons to take this   ondansetron  4 MG tablet Commonly known as: Zofran  Take 1 tablet (4 mg total) by mouth every 8 (eight) hours as needed for nausea or vomiting.   oxyCODONE  5 MG immediate release tablet Commonly known as: Oxy IR/ROXICODONE  Take 0.5 tablets (2.5 mg total) by mouth every 12 (twelve) hours as needed for severe pain (pain score 7-10).   oxyCODONE  10 mg 12 hr tablet Commonly known as: OxyCONTIN  Take 1 tablet (10 mg total) by mouth every 12 (twelve) hours.   phenylephrine  0.25 % suppository Commonly known as: (USE for PREPARATION-H) Place 1 suppository rectally 2 (two) times daily as needed for hemorrhoids (rectal pain).   polyethylene glycol 17 g packet Commonly known as: MIRALAX  / GLYCOLAX  Take 17 g by mouth 2 (two) times daily.   pregabalin  75 MG capsule Commonly known as: LYRICA  Take 1 capsule (75 mg total) by mouth daily. What changed: Another medication with the same name was changed. Make sure you understand how and when to take each.   pregabalin  25 MG capsule Commonly known as: LYRICA  Take 1 capsule (25 mg total) by mouth See admin instructions. Taking 75 mg by mouth in the morning, 25 mg afternoon and 25 mg in the evening What changed: additional instructions   PREPARATION H EX Apply 1 Application topically every 6 (six) hours as needed (rectal pain, hemorroids).   PRESERVISION AREDS 2 PO Take 1 capsule by mouth See admin instructions. Take 1 capsule by mouth  twice a day, with lunch and dinner.   sodium chloride  0.65 % nasal spray Commonly known as: OCEAN Place 1-2 sprays into the nose every 4 (four) hours as needed (sinus irritation).   Systane Nighttime Oint Place 1 application  into both eyes at bedtime as needed (dry eyes).   triamcinolone cream 0.1 % Commonly known as: KENALOG 1 Application. What changed:  how  to take this when to take this reasons to take this        Follow-up Information     Tammy Therisa MATSU, PA Follow up on 07/19/2024.   Specialty: Family Medicine Why: 2:00 pm for hospital follow up Contact information: [address] [place] [address] [phone]                Allergies[1]  Consultations: Cardiology  Procedures: 07/12/2024: TEE, no cardioversion  Discharge Exam: BP (!) 121/95 (BP Location: Right Arm)   Pulse 80   Temp 98.3 F (36.8 C) (Oral)   Resp (!) 22   Ht 4' 11 (1.499 m)   Wt 52.8 kg   SpO2 97%   BMI 23.53 kg/m  Physical Exam Constitutional:      Appearance: Normal appearance.     Comments: Hard of hearing  HENT:     Head: Normocephalic and atraumatic.     Mouth/Throat:     Mouth: Mucous membranes are moist.  Eyes:     Extraocular Movements: Extraocular movements intact.  Cardiovascular:     Rate and Rhythm: Normal rate and regular rhythm.  Pulmonary:     Effort: Pulmonary effort is normal. No respiratory distress.     Breath sounds: Normal breath sounds. No wheezing.  Abdominal:     General: Bowel sounds are normal. There is no distension.     Palpations: Abdomen is soft.     Tenderness: There is no abdominal tenderness.  Musculoskeletal:        General: Normal range of motion.     Cervical back: Normal range of motion and neck supple.  Skin:    General: Skin is warm and dry.  Neurological:     General: No focal deficit present.     Mental Status: She is alert.  Psychiatric:        Mood and Affect: Mood normal.      The results of  significant diagnostics from this hospitalization (including imaging, microbiology, ancillary and laboratory) are listed below for reference.   Microbiology: No results found for this or any previous visit (from the past 240 hours).   Labs: BNP (last 3 results) No results for input(s): BNP in the last 8760 hours. Basic Metabolic Panel: Recent Labs  Lab 07/11/24 0237 07/11/24 0238 07/12/24 0419 07/13/24 0312  NA  --  136 136 138  K  --  4.2 3.5 4.0  CL  --  98 96* 100  CO2  --  27 32 31  GLUCOSE  --  156* 125* 85  BUN  --  16 14 9   CREATININE  --  0.79 0.75 0.68  CALCIUM   --  9.3 8.3* 8.1*  MG 2.2  --  2.0  --    Liver Function Tests: Recent Labs  Lab 07/11/24 0238  AST 37  ALT 18  ALKPHOS 67  BILITOT 0.7  PROT 7.3  ALBUMIN 4.3   No results for input(s): LIPASE, AMYLASE in the last 168 hours. No results for input(s): AMMONIA in the last 168 hours. CBC: Recent Labs  Lab 07/11/24 0238  WBC 6.7  NEUTROABS 2.8  HGB 13.9  HCT 43.5  MCV 96.7  PLT 273   Cardiac Enzymes: No results for input(s): CKTOTAL, CKMB, CKMBINDEX, TROPONINI in the last 168 hours. BNP: Invalid input(s): POCBNP CBG: No results for input(s): GLUCAP in the last 168 hours. D-Dimer No results for input(s): DDIMER in the last 72 hours. Hgb A1c No results  for input(s): HGBA1C in the last 72 hours. Lipid Profile No results for input(s): CHOL, HDL, LDLCALC, TRIG, CHOLHDL, LDLDIRECT in the last 72 hours. Thyroid  function studies Recent Labs    07/11/24 0407  TSH 4.570*   Anemia work up No results for input(s): VITAMINB12, FOLATE, FERRITIN, TIBC, IRON, RETICCTPCT in the last 72 hours. Urinalysis    Component Value Date/Time   COLORURINE YELLOW 09/05/2021 1129   APPEARANCEUR CLOUDY (A) 09/05/2021 1129   LABSPEC 1.015 09/05/2021 1129   PHURINE 7.0 09/05/2021 1129   GLUCOSEU NEGATIVE 09/05/2021 1129   HGBUR NEGATIVE 09/05/2021 1129    BILIRUBINUR NEGATIVE 09/05/2021 1129   KETONESUR 20 (A) 09/05/2021 1129   PROTEINUR NEGATIVE 09/05/2021 1129   NITRITE NEGATIVE 09/05/2021 1129   LEUKOCYTESUR NEGATIVE 09/05/2021 1129   Sepsis Labs Recent Labs  Lab 07/11/24 0238  WBC 6.7   Microbiology No results found for this or any previous visit (from the past 240 hours).  Procedures/Studies: ECHO TEE Result Date: 07/12/2024    TRANSESOPHOGEAL ECHO REPORT   Patient Name:   Tammy Walker Date of Exam: 07/12/2024 Medical Rec #:  [id number]      Height:       59.0 in Accession #:    [id number]     Weight:       115.0 lb Date of Birth:  28-Jul-1930      BSA:          1.458 m Patient Age:    89 years       BP:           134/95 mmHg Patient Gender: F              HR:           96 bpm. Exam Location:  Inpatient Procedure: Transesophageal Echo, Color Doppler, Cardiac Doppler and Intracardiac            Opacification Agent (Both Spectral and Color Flow Doppler were            utilized during procedure). Indications:     Atrial Fibrillation  History:         Patient has prior history of Echocardiogram examinations, most                  recent 07/11/2024. Arrythmias:Atrial Fibrillation; Risk                  Factors:Hypertension and Dyslipidemia.  Sonographer:     Koleen Popper RDCS Referring Phys:  [id number] [facility] [place] Diagnosing Phys: Tammy Scarce MD  Sonographer Comments: Technically difficult study due to poor echo windows. Image acquisition challenging due to patient body habitus. PROCEDURE: After discussion of the risks and benefits of a TEE, an informed consent was obtained from the patient. The transesophogeal probe was passed without difficulty through the esophogus of the patient. Imaged were obtained with the patient in a left lateral decubitus position. Sedation performed by different physician. The patient was monitored while under deep sedation. Anesthestetic sedation was provided intravenously by Anesthesiology: 238mg  of Propofol ,  100mg  of Lidocaine . Image quality was  good. The patient's vital signs; including heart rate, blood pressure, and oxygen saturation; remained stable throughout the procedure. Supplementary images were obtained from transthoracic windows as indicated to answer the clinical question. The patient developed no complications during the procedure. Transgastric views not obtained: inability to pass probe.  IMPRESSIONS  1. Left ventricular ejection fraction, by estimation, is 30 to 35%. The left ventricle has moderately  decreased function. The left ventricle demonstrates global hypokinesis.  2. Right ventricular systolic function is normal. The right ventricular size is normal.  3. There is a small mobile density in the left atrial appendage with motion independent of the atrium. Consistent with thrombus. Measures 0.7 x 0.3 cm. Left atrial size was severely dilated. A left atrial/left atrial appendage thrombus was detected. The  LAA emptying velocity was 21 cm/s.  4. Right atrial size was severely dilated.  5. The mitral valve is normal in structure. Moderate mitral valve regurgitation. No evidence of mitral stenosis.  6. The aortic valve is tricuspid. Aortic valve regurgitation is mild. No aortic stenosis is present.  7. The inferior vena cava is normal in size with greater than 50% respiratory variability, suggesting right atrial pressure of 3 mmHg. Conclusion(s)/Recommendation(s): Findings concerning for LA/LAA thrombus. Cardioversion was not performed. Would recommend 4 weeks of anticoagulation prior to further attempts at cardioversion. FINDINGS  Left Ventricle: Left ventricular ejection fraction, by estimation, is 30 to 35%. The left ventricle has moderately decreased function. The left ventricle demonstrates global hypokinesis. Definity  contrast agent was given IV to delineate the left ventricular endocardial borders. The left ventricular internal cavity size was normal in size. There is no left ventricular  hypertrophy. Right Ventricle: The right ventricular size is normal. No increase in right ventricular wall thickness. Right ventricular systolic function is normal. Left Atrium: There is a small mobile density in the left atrial appendage with motion independent of the atrium. Consistent with thrombus. Measures 0.7 x 0.3 cm. Left atrial size was severely dilated. A left atrial/left atrial appendage thrombus was detected. The LAA emptying velocity was 21 cm/s. Right Atrium: Right atrial size was severely dilated. Pericardium: There is no evidence of pericardial effusion. Mitral Valve: The mitral valve is normal in structure. Moderate mitral valve regurgitation. No evidence of mitral valve stenosis. Tricuspid Valve: The tricuspid valve is normal in structure. Tricuspid valve regurgitation is not demonstrated. No evidence of tricuspid stenosis. Aortic Valve: The aortic valve is tricuspid. Aortic valve regurgitation is mild. No aortic stenosis is present. Pulmonic Valve: The pulmonic valve was normal in structure. Pulmonic valve regurgitation is not visualized. No evidence of pulmonic stenosis. Aorta: The aortic root is normal in size and structure. Venous: The inferior vena cava is normal in size with greater than 50% respiratory variability, suggesting right atrial pressure of 3 mmHg. IAS/Shunts: No atrial level shunt detected by color flow Doppler. Additional Comments: Technically challenging images due to large hiatal hernia. Unable to obtain transgastric images.  AORTA Ao Root diam: 3.10 cm Ao Asc diam:  3.50 cm MITRAL VALVE                  TRICUSPID VALVE MV VTI:      1.46 m           TR Peak grad:   33.2 mmHg MR Peak grad:    84.6 mmHg    TR Vmax:        288.00 cm/s MR Vmax:         460.00 cm/s MR Vmean:        336.0 cm/s MR PISA Nyquist: 0.3 m/s MR PISA:         3.08 cm MR PISA Radius:  0.70 cm Tammy Scarce MD Electronically signed by Tammy Scarce MD Signature Date/Time: 07/12/2024/4:32:31 PM    Final     EP STUDY Result Date: 07/12/2024 See surgical note for result.  ECHOCARDIOGRAM COMPLETE Result Date: 07/11/2024  ECHOCARDIOGRAM REPORT   Patient Name:   Tammy Walker Date of Exam: 07/11/2024 Medical Rec #:  [id number]      Height:       60.0 in Accession #:    [id number]     Weight:       115.0 lb Date of Birth:  02/11/1931      BSA:          1.475 m Patient Age:    89 years       BP:           135/80 mmHg Patient Gender: F              HR:           104 bpm. Exam Location:  Inpatient Procedure: 2D Echo, Cardiac Doppler and Color Doppler (Both Spectral and Color            Flow Doppler were utilized during procedure). Indications:    CHF  History:        Patient has prior history of Echocardiogram examinations, most                 recent 10/17/2022. Arrythmias:Atrial Fibrillation; Risk                 Factors:Dyslipidemia and Hypertension.  Sonographer:    Philomena Daring Referring Phys: [id number] RONDELL A SMITH IMPRESSIONS  1. Left ventricular ejection fraction, by estimation, is 35 to 40%. The left ventricle has moderately decreased function. The left ventricle demonstrates global hypokinesis. There is moderate asymmetric left ventricular hypertrophy of the basal-septal segment. Left ventricular diastolic parameters are indeterminate. Elevated left atrial pressure.  2. Right ventricular systolic function is normal. The right ventricular size is normal.  3. Left atrial size was severely dilated.  4. Right atrial size was severely dilated.  5. The mitral valve is abnormal. Moderate mitral valve regurgitation. No evidence of mitral stenosis.  6. Tricuspid valve regurgitation is mild to moderate.  7. The aortic valve is tricuspid. There is moderate calcification of the aortic valve. Aortic valve regurgitation is mild. Aortic valve sclerosis/calcification is present, without any evidence of aortic stenosis. Comparison(s): Prior images reviewed side by side. Changes from prior study are noted. EF slightly worse  compared to prior. FINDINGS  Left Ventricle: Left ventricular ejection fraction, by estimation, is 35 to 40%. The left ventricle has moderately decreased function. The left ventricle demonstrates global hypokinesis. The left ventricular internal cavity size was normal in size. There is moderate asymmetric left ventricular hypertrophy of the basal-septal segment. Abnormal (paradoxical) septal motion, consistent with left bundle branch block. Left ventricular diastolic parameters are indeterminate. Elevated left atrial pressure. Right Ventricle: The right ventricular size is normal. No increase in right ventricular wall thickness. Right ventricular systolic function is normal. Left Atrium: Left atrial size was severely dilated. Right Atrium: Right atrial size was severely dilated. Pericardium: There is no evidence of pericardial effusion. Mitral Valve: The mitral valve is abnormal. There is mild thickening of the mitral valve leaflet(s). There is mild calcification of the mitral valve leaflet(s). Mild to moderate mitral annular calcification. Moderate mitral valve regurgitation. No evidence of mitral valve stenosis. Tricuspid Valve: The tricuspid valve is normal in structure. Tricuspid valve regurgitation is mild to moderate. No evidence of tricuspid stenosis. Aortic Valve: The aortic valve is tricuspid. There is moderate calcification of the aortic valve. Aortic valve regurgitation is mild. Aortic valve sclerosis/calcification is present, without any evidence of aortic stenosis. Pulmonic Valve: The  pulmonic valve was not well visualized. Pulmonic valve regurgitation is not visualized. No evidence of pulmonic stenosis. Aorta: The aortic root and ascending aorta are structurally normal, with no evidence of dilitation. Venous: The inferior vena cava was not well visualized. IAS/Shunts: The atrial septum is grossly normal.  LEFT VENTRICLE PLAX 2D LVIDd:         3.30 cm     Diastology LVIDs:         2.60 cm     LV e'  medial:    5.33 cm/s LV PW:         0.80 cm     LV E/e' medial:  19.1 LV IVS:        1.80 cm     LV e' lateral:   7.62 cm/s LVOT diam:     2.10 cm     LV E/e' lateral: 13.4 LV SV:         38 LV SV Index:   26 LVOT Area:     3.46 cm  LV Volumes (MOD) LV vol d, MOD A2C: 53.5 ml LV vol d, MOD A4C: 59.6 ml LV vol s, MOD A2C: 34.0 ml LV vol s, MOD A4C: 32.8 ml LV SV MOD A2C:     19.5 ml LV SV MOD A4C:     59.6 ml LV SV MOD BP:      24.6 ml RIGHT VENTRICLE RV Basal diam:  3.90 cm RV Mid diam:    2.90 cm RV S prime:     14.10 cm/s TAPSE (M-mode): 1.9 cm LEFT ATRIUM             Index        RIGHT ATRIUM           Index LA diam:        4.50 cm 3.05 cm/m   RA Area:     15.30 cm LA Vol (A2C):   88.0 ml 59.64 ml/m  RA Volume:   39.20 ml  26.57 ml/m LA Vol (A4C):   63.4 ml 42.97 ml/m LA Biplane Vol: 78.3 ml 53.07 ml/m  AORTIC VALVE LVOT Vmax:   70.20 cm/s LVOT Vmean:  44.000 cm/s LVOT VTI:    0.109 m  AORTA Ao Root diam: 3.00 cm Ao Asc diam:  3.50 cm MITRAL VALVE                TRICUSPID VALVE MV Area (PHT): 4.77 cm     TR Peak grad:   40.7 mmHg MV Decel Time: 159 msec     TR Vmax:        319.00 cm/s MV E velocity: 102.00 cm/s                             SHUNTS                             Systemic VTI:  0.11 m                             Systemic Diam: 2.10 cm Tammy Bruckner MD Electronically signed by Tammy Bruckner MD Signature Date/Time: 07/11/2024/6:56:03 PM    Final    DG Chest 1 View Result Date: 07/11/2024 EXAM: 1 VIEW(S) XRAY OF THE CHEST 07/11/2024 02:57:00 AM COMPARISON: Chest x-ray 10/15/2022. CLINICAL HISTORY: short of breath, chest pain FINDINGS: LUNGS  AND PLEURA: Patient is rotated. Chronic coarsened interstitial markings. No focal pulmonary opacity. No pleural effusion. No pneumothorax. HEART AND MEDIASTINUM: Atherosclerotic plaque. Large hiatal hernia retrocardiac. BONES AND SOFT TISSUES: No acute osseous abnormality. IMPRESSION: 1. No acute cardiopulmonary abnormality. 2. Large  retrocardiac hiatal hernia. Electronically signed by: Morgane Naveau MD MD 07/11/2024 03:02 AM EST RP Workstation: [id number]     Time coordinating discharge: Over 30 minutes    Alm Apo, MD  [facility] 07/13/2024, 4:07 PM    [1] No Known Allergies  "

## 2024-07-13 NOTE — Evaluation (Signed)
 Physical Therapy Evaluation Patient Details Name: Tammy Walker MRN: [id number] DOB: 11-Feb-1931 Today's Date: 07/13/2024  History of Present Illness  Pt is a 89 y.o. F presenting to [facility] on 07/11/24 with SOB and chest pain. Upon TEE being performed, pt found to have aortic thrombus on 07/12/24. PMH is significant for CHF, A-fib, L bundle branch block, HTN, HLD, OP, GERD, and extreme curvature of spine and broken vertebrae.   Clinical Impression  Prior to admittance, pt lives with daughter and is mod I with mobility, using rollator and is mod I with ADLs. Pt presents to evaluation with deficits in mobility, strength, power, activity tolerance, and pain. Pt was able to ambulate household level distances with AD and no physical assistance (see gait section). PT will continue to treat pt while she is admitted. Anticipate no follow up PT needed at discharge.        If plan is discharge home, recommend the following: A little help with walking and/or transfers;A little help with bathing/dressing/bathroom;Assist for transportation;Assistance with cooking/housework   Can travel by private vehicle        Equipment Recommendations None recommended by PT  Recommendations for Other Services       Functional Status Assessment Patient has had a recent decline in their functional status and demonstrates the ability to make significant improvements in function in a reasonable and predictable amount of time.     Precautions / Restrictions Precautions Precautions: Fall Recall of Precautions/Restrictions: Intact Restrictions Weight Bearing Restrictions Per Provider Order: No      Mobility  Bed Mobility Overal bed mobility: Modified Independent             General bed mobility comments: increased time to complete.    Transfers Overall transfer level: Needs assistance Equipment used: Rollator (4 wheels) Transfers: Sit to/from Stand Sit to Stand: Supervision           General  transfer comment: Pt completed STS from EOB without AD and no physical assistance, pushing up from bed with BUE. Pt found to be reaching for counters while attempting to ambulate to bathroom. Pt then completed STS from bathroom utilizing L grab bar, and STS from rollator seat, pushing up with BUE on handrails and then slowly completing pivot to forward face rollator. VC for sequencing; increased time to complete.    Ambulation/Gait Ambulation/Gait assistance: Contact guard assist, +2 safety/equipment Gait Distance (Feet): 200 Feet Assistive device: Rollator (4 wheels) Gait Pattern/deviations: Step-through pattern, Decreased stride length, Decreased dorsiflexion - right, Decreased dorsiflexion - left, Shuffle, Trunk flexed Gait velocity: reduced Gait velocity interpretation: <1.8 ft/sec, indicate of risk for recurrent falls   General Gait Details: Pt demonstrates reciprocal gait pattern with increased reliance on rollator for improved stability. Pt demonstrating reduced DF bilaterally, displaying bilateral foot drag and shuffle like gait. No LOB or signs of instability noted.  Stairs            Wheelchair Mobility     Tilt Bed    Modified Rankin (Stroke Patients Only)       Balance Overall balance assessment: Needs assistance Sitting-balance support: No upper extremity supported, Feet supported Sitting balance-Leahy Scale: Good Sitting balance - Comments: seated EOB   Standing balance support: No upper extremity supported, During functional activity Standing balance-Leahy Scale: Fair Standing balance comment: able to stand without UE support but when ambulating without an AD, pt was reaching for railings and counters for improved stability  Pertinent Vitals/Pain Pain Assessment Pain Assessment: No/denies pain    Home Living Family/patient expects to be discharged to:: Private residence Living Arrangements: Children Available Help  at Discharge: Family;Available 24 hours/day Type of Home: House Home Access: Stairs to enter Entrance Stairs-Rails: Right;Left (tends to use L hand railing and nothing in the R hand) Entrance Stairs-Number of Steps: 3   Home Layout: One level Home Equipment: Educational Psychologist (4 wheels);Transport chair      Prior Function Prior Level of Function : Independent/Modified Independent             Mobility Comments: mod I with rollator ADLs Comments: mod I. needs occasional asssitance getting a sweater on her back. is indep with chores. wears glasses occasionally     Extremity/Trunk Assessment   Upper Extremity Assessment Upper Extremity Assessment: Defer to OT evaluation    Lower Extremity Assessment Lower Extremity Assessment: Generalized weakness    Cervical / Trunk Assessment Cervical / Trunk Assessment: Kyphotic (hyperkyphotic with slight scoliotic curve (L?) secondary to OP)  Communication   Communication Communication: Impaired Factors Affecting Communication: Hearing impaired    Cognition Arousal: Alert Behavior During Therapy: WFL for tasks assessed/performed   PT - Cognitive impairments: No apparent impairments                         Following commands: Intact       Cueing Cueing Techniques: Verbal cues, Gestural cues     General Comments General comments (skin integrity, edema, etc.): SpO2 90% on RA while ambulating, HR: 109 bpm    Exercises     Assessment/Plan    PT Assessment Patient needs continued PT services  PT Problem List Decreased strength;Decreased activity tolerance;Decreased mobility;Pain       PT Treatment Interventions DME instruction;Gait training;Functional mobility training;Therapeutic activities;Therapeutic exercise;Balance training;Patient/family education;Manual techniques;Modalities    PT Goals (Current goals can be found in the Care Plan section)  Acute Rehab PT Goals Patient Stated Goal: to go back to  sleep PT Goal Formulation: With patient Time For Goal Achievement: 07/27/24 Potential to Achieve Goals: Good    Frequency Min 1X/week     Co-evaluation PT/OT/SLP Co-Evaluation/Treatment: Yes Reason for Co-Treatment: For patient/therapist safety;To address functional/ADL transfers PT goals addressed during session: Mobility/safety with mobility;Balance;Proper use of DME;Strengthening/ROM         AM-PAC PT 6 Clicks Mobility  Outcome Measure Help needed turning from your back to your side while in a flat bed without using bedrails?: None Help needed moving from lying on your back to sitting on the side of a flat bed without using bedrails?: None Help needed moving to and from a bed to a chair (including a wheelchair)?: A Little Help needed standing up from a chair using your arms (e.g., wheelchair or bedside chair)?: A Little Help needed to walk in hospital room?: A Little Help needed climbing 3-5 steps with a railing? : Total 6 Click Score: 18    End of Session Equipment Utilized During Treatment: Gait belt Activity Tolerance: Patient tolerated treatment well Patient left: in bed;with call bell/phone within reach;with family/visitor present Nurse Communication: Mobility status PT Visit Diagnosis: Other abnormalities of gait and mobility (R26.89);Muscle weakness (generalized) (M62.81);Pain    Time: 9053-8977 PT Time Calculation (min) (ACUTE ONLY): 36 min   Charges:   PT Evaluation $PT Eval Low Complexity: 1 Low PT Treatments $Therapeutic Activity: 8-22 mins PT General Charges $$ ACUTE PT VISIT: 1 Visit  Leontine Hilt DPT Acute Rehab Services [phone] Prefer contact via chat   Leontine KATHEE Hilt 07/13/2024, 10:39 AM

## 2024-07-13 NOTE — Discharge Instructions (Signed)
 Call MD for weight gain 3 pounds / 24 hours or 5 pound/week.  Sodium restricted diet.  Bring weight log to office appointments.  Continue monitoring for A-fib recurrence with her personal ECG device Darris).

## 2024-07-13 NOTE — Evaluation (Signed)
 Occupational Therapy Evaluation Patient Details Name: Tammy Walker MRN: [id number] DOB: 1930/08/12 Today's Date: 07/13/2024   History of Present Illness   Pt is a 89 y.o. F presenting to [facility] on 07/11/24 with SOB and chest pain. Upon TEE being performed, pt found to have aortic thrombus on 07/12/24. PMH is significant for CHF, A-fib, L bundle branch block, HTN, HLD, OP, GERD, and extreme curvature of spine and broken vertebrae.     Clinical Impressions Pt presented with daughter throughout the session. At [facility] she uses 4WW for most ambulation with occasional HH assist up the stairs or none into the bathroom. Pt at this time completed toileting tasks post set up with supervision to CGA. She then agreed to ambulate with supervision with 4WW. At this time Acute Occupational Therapy to follow and no follow up therapy recommendations at this time.      If plan is discharge home, recommend the following:   Assistance with cooking/housework;Assist for transportation;Help with stairs or ramp for entrance;A little help with bathing/dressing/bathroom     Functional Status Assessment   Patient has had a recent decline in their functional status and demonstrates the ability to make significant improvements in function in a reasonable and predictable amount of time.     Equipment Recommendations   None recommended by OT     Recommendations for Other Services         Precautions/Restrictions   Precautions Precautions: Fall Recall of Precautions/Restrictions: Intact Restrictions Weight Bearing Restrictions Per Provider Order: No     Mobility Bed Mobility Overal bed mobility: Needs Assistance Bed Mobility: Supine to Sit, Sit to Supine     Supine to sit: Contact guard Sit to supine: Contact guard assist        Transfers Overall transfer level: Needs assistance Equipment used: Rollator (4 wheels) Transfers: Sit to/from Stand Sit to Stand: Contact guard assist                   Balance Overall balance assessment: Needs assistance Sitting-balance support: Feet supported Sitting balance-Leahy Scale: Good     Standing balance support: No upper extremity supported, Bilateral upper extremity supported Standing balance-Leahy Scale: Fair                             ADL either performed or assessed with clinical judgement   ADL Overall ADL's : Needs assistance/impaired Eating/Feeding: Independent;Sitting   Grooming: Wash/dry hands;Set up;Sitting   Upper Body Bathing: Sitting;Minimal assistance;Contact guard assist   Lower Body Bathing: Contact guard assist;Sit to/from stand   Upper Body Dressing : Minimal assistance;Sitting   Lower Body Dressing: Minimal assistance;Sit to/from stand   Toilet Transfer: Contact guard assist;Cueing for safety;Cueing for sequencing;Rolling walker (2 wheels)   Toileting- Clothing Manipulation and Hygiene: Contact guard assist;Sit to/from stand       Functional mobility during ADLs: Contact guard assist;Rolling walker (2 wheels);Cueing for sequencing;Cueing for safety       Vision Baseline Vision/History: 1 Wears glasses Ability to See in Adequate Light: 0 Adequate Patient Visual Report: No change from baseline Vision Assessment?: Wears glasses for reading;Wears glasses for driving     Perception Perception: Within Functional Limits       Praxis Praxis: WFL       Pertinent Vitals/Pain Pain Assessment Pain Assessment: No/denies pain     Extremity/Trunk Assessment Upper Extremity Assessment Upper Extremity Assessment: Generalized weakness   Lower Extremity Assessment Lower Extremity Assessment: Generalized weakness  Cervical / Trunk Assessment Cervical / Trunk Assessment: Kyphotic   Communication Communication Communication: Impaired Factors Affecting Communication: Hearing impaired (even with hearing aides in, R ear slightly better)   Cognition Arousal: Alert Behavior During  Therapy: WFL for tasks assessed/performed Cognition: No apparent impairments                               Following commands: Intact       Cueing  General Comments   Cueing Techniques: Verbal cues;Gestural cues  Pt max HR 109 O2 lowest to 90%   Exercises     Shoulder Instructions      Home Living Family/patient expects to be discharged to:: Private residence Living Arrangements: Children Available Help at Discharge: Family;Available 24 hours/day Type of Home: House Home Access: Stairs to enter [employer] of Steps: 3 Entrance Stairs-Rails: Right;Left (unable to reach both at the same time) Home Layout: One level     Bathroom Shower/Tub: Chief Strategy Officer: Standard Bathroom Accessibility: No   Home Equipment: Educational Psychologist (4 wheels);Transport chair          Prior Functioning/Environment Prior Level of Function : Independent/Modified Independent             Mobility Comments: mod I with rollator ADLs Comments: mod I. needs occasional asssitance getting a sweater on her back. is indep with chores. wears glasses occasionally    OT Problem List: Decreased strength;Decreased activity tolerance;Impaired balance (sitting and/or standing);Decreased safety awareness;Decreased knowledge of use of DME or AE   OT Treatment/Interventions: Self-care/ADL training;Therapeutic exercise;DME and/or AE instruction;Therapeutic activities;Patient/family education;Balance training      OT Goals(Current goals can be found in the care plan section)   Acute Rehab OT Goals Patient Stated Goal: to rest OT Goal Formulation: With patient Time For Goal Achievement: 07/27/24 Potential to Achieve Goals: Fair   OT Frequency:  Min 2X/week    Co-evaluation PT/OT/SLP Co-Evaluation/Treatment: Yes Reason for Co-Treatment: Complexity of the patient's impairments (multi-system involvement) PT goals addressed during session:  Mobility/safety with mobility;Balance;Proper use of DME;Strengthening/ROM OT goals addressed during session: ADL's and self-care      AM-PAC OT 6 Clicks Daily Activity     Outcome Measure Help from another person eating meals?: None Help from another person taking care of personal grooming?: A Little Help from another person toileting, which includes using toliet, bedpan, or urinal?: A Little Help from another person bathing (including washing, rinsing, drying)?: A Little Help from another person to put on and taking off regular upper body clothing?: A Little Help from another person to put on and taking off regular lower body clothing?: A Little 6 Click Score: 19   End of Session Equipment Utilized During Treatment: Gait belt;Rolling walker (2 wheels) Nurse Communication: Mobility status  Activity Tolerance: Patient tolerated treatment well Patient left: in bed;with call bell/phone within reach;with family/visitor present  OT Visit Diagnosis: Unsteadiness on feet (R26.81);Other abnormalities of gait and mobility (R26.89);Muscle weakness (generalized) (M62.81)                Time: 9053-8977 OT Time Calculation (min): 36 min Charges:  OT General Charges $OT Visit: 1 Visit OT Evaluation $OT Eval Low Complexity: 1 Low OT Treatments $Self Care/Home Management : 8-22 mins  Warrick POUR OTR/L  Acute Rehab Services  [phone] office number   Warrick Berber 07/13/2024, 11:49 AM

## 2024-07-13 NOTE — Plan of Care (Signed)
 Patient ID: Tammy Walker, female   DOB: 1930/09/25, 89 y.o.   MRN: [id number]  Problem: Education: Goal: Knowledge of General Education information will improve Description: Including pain rating scale, medication(s)/side effects and non-pharmacologic comfort measures 07/13/2024 1056 by Adolm Verdie BIRCH, RN Outcome: Adequate for Discharge 07/13/2024 1055 by Adolm Verdie BIRCH, RN Outcome: Progressing   Problem: Health Behavior/Discharge Planning: Goal: Ability to manage health-related needs will improve 07/13/2024 1056 by Adolm Verdie BIRCH, RN Outcome: Adequate for Discharge 07/13/2024 1055 by Adolm Verdie BIRCH, RN Outcome: Progressing   Problem: Clinical Measurements: Goal: Ability to maintain clinical measurements within normal limits will improve 07/13/2024 1056 by Adolm Verdie BIRCH, RN Outcome: Adequate for Discharge 07/13/2024 1055 by Adolm Verdie BIRCH, RN Outcome: Progressing Goal: Will remain free from infection 07/13/2024 1056 by Adolm Verdie BIRCH, RN Outcome: Adequate for Discharge 07/13/2024 1055 by Adolm Verdie BIRCH, RN Outcome: Progressing Goal: Diagnostic test results will improve 07/13/2024 1056 by Adolm Verdie BIRCH, RN Outcome: Adequate for Discharge 07/13/2024 1055 by Adolm Verdie BIRCH, RN Outcome: Progressing Goal: Respiratory complications will improve 07/13/2024 1056 by Adolm Verdie BIRCH, RN Outcome: Adequate for Discharge 07/13/2024 1055 by Adolm Verdie BIRCH, RN Outcome: Progressing Goal: Cardiovascular complication will be avoided 07/13/2024 1056 by Adolm Verdie BIRCH, RN Outcome: Adequate for Discharge 07/13/2024 1055 by Adolm Verdie BIRCH, RN Outcome: Progressing   Problem: Activity: Goal: Risk for activity intolerance will decrease 07/13/2024 1056 by Adolm Verdie BIRCH, RN Outcome: Adequate for Discharge 07/13/2024 1055 by Adolm Verdie BIRCH, RN Outcome: Progressing   Problem: Nutrition: Goal: Adequate nutrition will be maintained 07/13/2024 1056 by Adolm Verdie BIRCH, RN Outcome: Adequate for Discharge 07/13/2024 1055 by Adolm Verdie BIRCH, RN Outcome: Progressing   Problem: Coping: Goal: Level of anxiety will decrease 07/13/2024 1056 by Adolm Verdie BIRCH, RN Outcome: Adequate for Discharge 07/13/2024 1055 by Adolm Verdie BIRCH, RN Outcome: Progressing   Problem: Elimination: Goal: Will not experience complications related to bowel motility 07/13/2024 1056 by Adolm Verdie BIRCH, RN Outcome: Adequate for Discharge 07/13/2024 1055 by Adolm Verdie BIRCH, RN Outcome: Progressing Goal: Will not experience complications related to urinary retention 07/13/2024 1056 by Adolm Verdie BIRCH, RN Outcome: Adequate for Discharge 07/13/2024 1055 by Adolm Verdie BIRCH, RN Outcome: Progressing   Problem: Pain Managment: Goal: General experience of comfort will improve and/or be controlled 07/13/2024 1056 by Adolm Verdie BIRCH, RN Outcome: Adequate for Discharge 07/13/2024 1055 by Adolm Verdie BIRCH, RN Outcome: Progressing   Problem: Safety: Goal: Ability to remain free from injury will improve 07/13/2024 1056 by Adolm Verdie BIRCH, RN Outcome: Adequate for Discharge 07/13/2024 1055 by Adolm Verdie BIRCH, RN Outcome: Progressing   Problem: Skin Integrity: Goal: Risk for impaired skin integrity will decrease 07/13/2024 1056 by Adolm Verdie BIRCH, RN Outcome: Adequate for Discharge 07/13/2024 1055 by Adolm Verdie BIRCH, RN Outcome: Progressing   Problem: Acute Rehab PT Goals(only PT should resolve) Goal: Patient Will Transfer Sit To/From Stand Outcome: Adequate for Discharge Goal: Pt Will Ambulate Outcome: Adequate for Discharge Goal: Pt/caregiver will Perform Home Exercise Program Outcome: Adequate for Discharge    Verdie BIRCH Adolm, RN

## 2024-07-14 ENCOUNTER — Telehealth: Payer: Self-pay | Admitting: Cardiology

## 2024-07-14 ENCOUNTER — Ambulatory Visit: Admitting: Cardiology

## 2024-07-14 NOTE — Telephone Encounter (Signed)
 Daughter called in reporting patient went to lay down for a nap this afternoon and felt short of breath. Did improve some with getting up. Other daughter called to have their usual phone conversation and she didn't feel like talking which was usual for her. BP 149/94 HR 102 O2 97%. Recent admission for afib and CHF. Converted to sinus rhythm with amiodarone  and discharged on amiodarone  200mg  daily, savaysa . I advised to take a dose of lasix  20mg  today as BP stable. Would take an additional dose tomorrow as long as BP stable. If breathing were to worsening or develop additional symptoms then would need ED evaluation. Daughter agreeable to plan and thanked me for callback

## 2024-07-15 ENCOUNTER — Telehealth: Payer: Self-pay | Admitting: Cardiology

## 2024-07-15 NOTE — Telephone Encounter (Signed)
 I will update the requesting office to see notes from preop APP Aline Door, [facility] as to post pone dental procedure until the pt has been seen by her cardiologist Dr. Jeffrie on 07/20/24. Pt is due for her yrly appt with the cardiologist.   Appt notes from Dr. Jeffrie have been updated need preop clearance.

## 2024-07-15 NOTE — Telephone Encounter (Signed)
" ° °  Name: Tammy Walker  DOB: 1930/12/31  MRN: [id number]  Primary Cardiologist: Oneil Parchment, MD  Chart reviewed as part of pre-operative protocol coverage. Dental extractions are low risk procedures; however, this is going to be done under general anesthesia. She has not been seen in our office in 1 year and she was recently admitted to the hospital for atrial fibrillation and CHF. Therefore, she will require a follow-up in-office visit in order to better assess preoperative cardiovascular risk. She already has an appointment with Dr. Parchment scheduled for 07/20/2024, so would recommend postponing procedure until after this visit.   Pre-op covering staff: - Can you please notify patient and surgeon's office.   Kaile Bixler E Divina Neale, PA-C  07/15/2024, 10:08 AM   "

## 2024-07-15 NOTE — Telephone Encounter (Signed)
"  ° °  Pre-operative Risk Assessment    Patient Name: Tammy Walker  DOB: 1930/10/06 MRN: [id number]   Date of last office visit: 07/13/23 Date of next office visit: 07/20/24  Request for Surgical Clearance    Procedure:  extraction of 2 lower left molars   Date of Surgery:  Clearance 07/18/24                                Surgeon:  Dr. Percilla Missaghian  Surgeon's Group or Practice Name:  [facility]  Phone number:  [phone]  Fax number:  [phone]   Type of Clearance Requested:   - Medical    Type of Anesthesia:  General    Additional requests/questions:    [name]   07/15/2024, 9:46 AM   "

## 2024-07-18 ENCOUNTER — Encounter: Admitting: Physical Medicine & Rehabilitation

## 2024-07-18 ENCOUNTER — Emergency Department (HOSPITAL_COMMUNITY)

## 2024-07-18 ENCOUNTER — Inpatient Hospital Stay (HOSPITAL_COMMUNITY)
Admission: EM | Admit: 2024-07-18 | Discharge: 2024-07-22 | DRG: 291 | Disposition: A | Discharge status: Home / Self Care | Attending: Internal Medicine | Admitting: Internal Medicine

## 2024-07-18 ENCOUNTER — Other Ambulatory Visit: Payer: Self-pay

## 2024-07-18 DIAGNOSIS — I10 Essential (primary) hypertension: Secondary | ICD-10-CM | POA: Diagnosis present

## 2024-07-18 DIAGNOSIS — E875 Hyperkalemia: Secondary | ICD-10-CM | POA: Diagnosis present

## 2024-07-18 DIAGNOSIS — Z6821 Body mass index (BMI) 21.0-21.9, adult: Secondary | ICD-10-CM

## 2024-07-18 DIAGNOSIS — R079 Chest pain, unspecified: Principal | ICD-10-CM

## 2024-07-18 DIAGNOSIS — Z66 Do not resuscitate: Secondary | ICD-10-CM | POA: Diagnosis present

## 2024-07-18 DIAGNOSIS — Z8249 Family history of ischemic heart disease and other diseases of the circulatory system: Secondary | ICD-10-CM

## 2024-07-18 DIAGNOSIS — F32A Depression, unspecified: Secondary | ICD-10-CM | POA: Diagnosis present

## 2024-07-18 DIAGNOSIS — E43 Unspecified severe protein-calorie malnutrition: Secondary | ICD-10-CM | POA: Diagnosis present

## 2024-07-18 DIAGNOSIS — I160 Hypertensive urgency: Secondary | ICD-10-CM | POA: Diagnosis present

## 2024-07-18 DIAGNOSIS — Z79891 Long term (current) use of opiate analgesic: Secondary | ICD-10-CM

## 2024-07-18 DIAGNOSIS — Z79899 Other long term (current) drug therapy: Secondary | ICD-10-CM

## 2024-07-18 DIAGNOSIS — R54 Age-related physical debility: Secondary | ICD-10-CM | POA: Diagnosis present

## 2024-07-18 DIAGNOSIS — E785 Hyperlipidemia, unspecified: Secondary | ICD-10-CM | POA: Diagnosis present

## 2024-07-18 DIAGNOSIS — I480 Paroxysmal atrial fibrillation: Secondary | ICD-10-CM | POA: Diagnosis present

## 2024-07-18 DIAGNOSIS — Z7901 Long term (current) use of anticoagulants: Secondary | ICD-10-CM

## 2024-07-18 DIAGNOSIS — I447 Left bundle-branch block, unspecified: Secondary | ICD-10-CM | POA: Diagnosis present

## 2024-07-18 DIAGNOSIS — I509 Heart failure, unspecified: Secondary | ICD-10-CM

## 2024-07-18 DIAGNOSIS — I2489 Other forms of acute ischemic heart disease: Secondary | ICD-10-CM | POA: Diagnosis present

## 2024-07-18 DIAGNOSIS — I5033 Acute on chronic diastolic (congestive) heart failure: Secondary | ICD-10-CM | POA: Diagnosis present

## 2024-07-18 DIAGNOSIS — G8929 Other chronic pain: Secondary | ICD-10-CM | POA: Diagnosis present

## 2024-07-18 DIAGNOSIS — I130 Hypertensive heart and chronic kidney disease with heart failure and stage 1 through stage 4 chronic kidney disease, or unspecified chronic kidney disease: Principal | ICD-10-CM | POA: Diagnosis present

## 2024-07-18 DIAGNOSIS — J9601 Acute respiratory failure with hypoxia: Secondary | ICD-10-CM | POA: Diagnosis present

## 2024-07-18 DIAGNOSIS — N182 Chronic kidney disease, stage 2 (mild): Secondary | ICD-10-CM | POA: Diagnosis present

## 2024-07-18 DIAGNOSIS — K449 Diaphragmatic hernia without obstruction or gangrene: Secondary | ICD-10-CM | POA: Diagnosis present

## 2024-07-18 LAB — CBC WITH DIFFERENTIAL/PLATELET
Abs Immature Granulocytes: 0.06 K/uL (ref 0.00–0.07)
Basophils Absolute: 0.1 K/uL (ref 0.0–0.1)
Basophils Relative: 1 %
Eosinophils Absolute: 0.1 K/uL (ref 0.0–0.5)
Eosinophils Relative: 2 %
HCT: 44.3 % (ref 36.0–46.0)
Hemoglobin: 14.1 g/dL (ref 12.0–15.0)
Immature Granulocytes: 1 %
Lymphocytes Relative: 48 %
Lymphs Abs: 3.9 K/uL (ref 0.7–4.0)
MCH: 30.5 pg (ref 26.0–34.0)
MCHC: 31.8 g/dL (ref 30.0–36.0)
MCV: 95.7 fL (ref 80.0–100.0)
Monocytes Absolute: 0.7 K/uL (ref 0.1–1.0)
Monocytes Relative: 9 %
Neutro Abs: 3.1 K/uL (ref 1.7–7.7)
Neutrophils Relative %: 39 %
Platelets: 302 K/uL (ref 150–400)
RBC: 4.63 MIL/uL (ref 3.87–5.11)
RDW: 14.6 % (ref 11.5–15.5)
WBC: 8 K/uL (ref 4.0–10.5)
nRBC: 0 % (ref 0.0–0.2)

## 2024-07-18 LAB — I-STAT VENOUS BLOOD GAS, ED
Acid-Base Excess: 3 mmol/L — ABNORMAL HIGH (ref 0.0–2.0)
Bicarbonate: 28.8 mmol/L — ABNORMAL HIGH (ref 20.0–28.0)
Calcium, Ion: 0.99 mmol/L — ABNORMAL LOW (ref 1.15–1.40)
HCT: 42 % (ref 36.0–46.0)
Hemoglobin: 14.3 g/dL (ref 12.0–15.0)
O2 Saturation: 77 %
Potassium: 7.9 mmol/L (ref 3.5–5.1)
Sodium: 132 mmol/L — ABNORMAL LOW (ref 135–145)
TCO2: 30 mmol/L (ref 22–32)
pCO2, Ven: 45.7 mmHg (ref 44–60)
pH, Ven: 7.407 (ref 7.25–7.43)
pO2, Ven: 42 mmHg (ref 32–45)

## 2024-07-18 LAB — PRO BRAIN NATRIURETIC PEPTIDE: Pro Brain Natriuretic Peptide: 1697 pg/mL — ABNORMAL HIGH

## 2024-07-18 LAB — COMPREHENSIVE METABOLIC PANEL WITH GFR
ALT: 12 U/L (ref 0–44)
AST: 28 U/L (ref 15–41)
Albumin: 4.2 g/dL (ref 3.5–5.0)
Alkaline Phosphatase: 67 U/L (ref 38–126)
Anion gap: 12 (ref 5–15)
BUN: 14 mg/dL (ref 8–23)
CO2: 27 mmol/L (ref 22–32)
Calcium: 9.4 mg/dL (ref 8.9–10.3)
Chloride: 96 mmol/L — ABNORMAL LOW (ref 98–111)
Creatinine, Ser: 0.9 mg/dL (ref 0.44–1.00)
GFR, Estimated: >60 mL/min
Glucose, Bld: 166 mg/dL — ABNORMAL HIGH (ref 70–99)
Potassium: 4.7 mmol/L (ref 3.5–5.1)
Sodium: 135 mmol/L (ref 135–145)
Total Bilirubin: 0.6 mg/dL (ref 0.0–1.2)
Total Protein: 7.2 g/dL (ref 6.5–8.1)

## 2024-07-18 LAB — TROPONIN T, HIGH SENSITIVITY: Troponin T High Sensitivity: 15 ng/L (ref 0–19)

## 2024-07-18 MED ORDER — FUROSEMIDE 10 MG/ML IJ SOLN
80.0000 mg | Freq: Once | INTRAMUSCULAR | Status: AC
  Administered 2024-07-19: 80 mg via INTRAVENOUS
  Filled 2024-07-18: qty 8

## 2024-07-18 NOTE — ED Triage Notes (Signed)
 Pt arrives with EMS from home for SOB/resp distress, CP that started approx 45 mins prior to arrival. Pt arrives on cpap  H/o CHF, was recently seen last week for chf exac but family reports not getting any better 160/80 150/90 nitroglycerin .4mg  sublinqual  96% on cpap  86% RA 18g L AC Pitting edema lower extremities Able to say yes and no to questions otherwise EMS was unable to evaluate orientation status L bundle branch  Rales

## 2024-07-18 NOTE — ED Notes (Signed)
 EDP and RT to bedside upon arrival

## 2024-07-18 NOTE — ED Provider Notes (Signed)
 " [facility] Provider Note   CSN: [id number] Arrival date & time: 07/18/24  2247     Patient presents with: No chief complaint on file.   Tammy Walker is a 89 y.o. female with a PMHx notable for chronic dCHF, permanent atrial fibrillation on chronic anticoagulation, left bundle branch block, hypertension, hyperlipidemia who presents today for evaluation of acute shortness of breath.  Patient was recently discharged from inpatient facility for concerns of acute heart failure exacerbation.  Has been on Lasix  every other day for outpatient diuretic therapy.  Initially was doing well however tonight had sudden onset chest pain as well as shortness of breath.  On initial EMS assessment patient was found to be in respiratory distress and hypoxic on room air subsequently placed on positive pressure ventilation.  Patient was also provided nitroglycerin .  Initially noted to be hypertensive.   HPI     Prior to Admission medications  Medication Sig Start Date End Date Taking? Authorizing Provider  acetaminophen  (TYLENOL ) 500 MG tablet 2 tablets in the AM, 1 tablet in the afternoon, and 1 tablet at night Orally three times a day Patient taking differently: Take 500-1,000 mg by mouth See admin instructions. Take 1-2 tablets (500mg -1000mg ) by mouth at 0800 depending on pain, take 1 tablet (500mg ) around 1400, and take 1 tablet at 2000.    [provider]  amiodarone  (PACERONE ) 200 MG tablet Take 1 tablet (200 mg total) by mouth daily. 07/13/24   Patsy Lenis, MD  atorvastatin  (LIPITOR) 20 MG tablet TAKE 1 TABLET(20 MG) BY MOUTH AT BEDTIME 09/28/23   Jeffrie Oneil BROCKS, MD  bisacodyl  (DULCOLAX) 5 MG EC tablet Take 10 mg by mouth at bedtime.    [provider]  Calcium  Carb-Cholecalciferol  (CALCIUM  + VITAMIN D3 PO) Take 1 tablet by mouth 2 (two) times daily with a meal.    [provider]  Calcium  Carb-Cholecalciferol  (CHEWABLE CALCIUM /D3  PO) Take 2 each by mouth 2 (two) times daily with a meal. Vitafusion; calcium  + D3 gummies    [provider]  carboxymethylcellulose (REFRESH PLUS) 0.5 % SOLN Place 1 drop into both eyes every 4 (four) hours as needed (eye irritatation).    [provider]  cetirizine (ZYRTEC) 10 MG tablet Take 10 mg by mouth daily.    [provider]  clotrimazole (LOTRIMIN) 1 % cream Apply 1 application  topically 2 (two) times daily as needed (skin irritation).    [provider]  denosumab  (PROLIA ) 60 MG/ML SOSY injection Inject 60 mg into the skin every 6 (six) months.    [provider]  diclofenac  Sodium (VOLTAREN ) 1 % GEL Apply 1 Application topically 4 (four) times daily as needed (pain).    [provider]  edoxaban  (SAVAYSA ) 30 MG TABS tablet Take 1 tablet (30 mg total) by mouth daily. Patient taking differently: Take 30 mg by mouth at bedtime. 08/03/23   Jeffrie Oneil BROCKS, MD  furosemide  (LASIX ) 20 MG tablet Take 1 tablet (20 mg total) by mouth 3 (three) times a week. 07/13/24 07/13/25  Patsy Lenis, MD  hydrocortisone  (ANUSOL -HC) 2.5 % rectal cream Apply 1 Application topically 2 (two) times daily as needed for hemorrhoids (rectal pain). 03/21/24   [provider]  Hydrocortisone  (PREPARATION H EX) Apply 1 Application topically every 6 (six) hours as needed (rectal pain, hemorroids).    [provider]  ipratropium (ATROVENT ) 0.03 % nasal spray Place 2 sprays into both nostrils every 12 (  twelve) hours. 05/16/24   Masciello, Hadassah BROCKS, MD  LINZESS 72 MCG capsule Take 72 mcg by mouth daily as needed. Patient taking differently: Take 72 mcg by mouth daily as needed (severe constipation). 06/10/23   [provider]  Multiple Vitamins-Minerals (PRESERVISION AREDS 2 PO) Take 1 capsule by mouth See admin instructions. Take 1 capsule by mouth twice a day, with lunch and dinner.    [provider]  ondansetron  (ZOFRAN ) 4 MG tablet  Take 1 tablet (4 mg total) by mouth every 8 (eight) hours as needed for nausea or vomiting. 09/09/21   Rai, Nydia POUR, MD  oxyCODONE  (OXY IR/ROXICODONE ) 5 MG immediate release tablet Take 0.5 tablets (2.5 mg total) by mouth every 12 (twelve) hours as needed for severe pain (pain score 7-10). 06/02/24   Emeline Joesph BROCKS, DO  oxyCODONE  (OXYCONTIN ) 10 mg 12 hr tablet Take 1 tablet (10 mg total) by mouth every 12 (twelve) hours. 06/20/24   Urbano Albright, MD  phenylephrine  (,USE FOR PREPARATION-H,) 0.25 % suppository Place 1 suppository rectally 2 (two) times daily as needed for hemorrhoids (rectal pain).    [provider]  polyethylene glycol (MIRALAX  / GLYCOLAX ) 17 g packet Take 17 g by mouth 2 (two) times daily.    [provider]  pregabalin  (LYRICA ) 25 MG capsule Take 1 capsule (25 mg total) by mouth See admin instructions. Taking 75 mg by mouth in the morning, 25 mg afternoon and 25 mg in the evening Patient taking differently: Take 25 mg by mouth See admin instructions. Take 1 capsule (25mg ) by mouth twice daily, at 1400 and at 2000. 04/19/24   Urbano Albright, MD  pregabalin  (LYRICA ) 75 MG capsule Take 1 capsule (75 mg total) by mouth daily. 04/19/24   Urbano Albright, MD  sodium chloride  (OCEAN) 0.65 % nasal spray Place 1-2 sprays into the nose every 4 (four) hours as needed (sinus irritation).    [provider]  triamcinolone cream (KENALOG) 0.1 % 1 Application. Patient taking differently: Apply 1 Application topically 2 (two) times daily as needed (skin irritation). 08/24/23   [provider]  White Petrolatum-Mineral Oil (SYSTANE NIGHTTIME) OINT Place 1 application  into both eyes at bedtime as needed (dry eyes).    [provider]    Allergies: Patient has no known allergies.    Review of Systems  Updated Vital Signs Pulse 95   Resp (!) 24   Ht 4' 11 (1.499 m)   Wt 52.8 kg   SpO2 100%   BMI 23.53 kg/m   Physical Exam  (all  labs ordered are listed, but only abnormal results are displayed) Labs Reviewed  COMPREHENSIVE METABOLIC PANEL WITH GFR - Abnormal; Notable for the following components:      Result Value   Chloride 96 (*)    Glucose, Bld 166 (*)    All other components within normal limits  PRO BRAIN NATRIURETIC PEPTIDE - Abnormal; Notable for the following components:   Pro Brain Natriuretic Peptide 1,697.0 (*)    All other components within normal limits  I-STAT VENOUS BLOOD GAS, ED - Abnormal; Notable for the following components:   Bicarbonate 28.8 (*)    Acid-Base Excess 3.0 (*)    Sodium 132 (*)    Potassium 7.9 (*)    Calcium , Ion 0.99 (*)    All other components within normal limits  CBC WITH DIFFERENTIAL/PLATELET  TROPONIN T, HIGH SENSITIVITY    EKG: EKG Interpretation Date/Time:  Monday July 18 2024 22:56:40 EST  Ventricular Rate:  106 PR Interval:    QRS Duration:  159 QT Interval:  401 QTC Calculation: 533 R Axis:   114  Text Interpretation: Atrial fibrillation Nonspecific intraventricular conduction delay Anterior infarct, acute (LAD) Artifact in lead(s) I II aVR aVL aVF >>> Acute MI <<< LBBB baseline Confirmed by Bari Flank [phone]) on 07/18/2024 11:02:25 PM  Radiology: DG Chest Portable 1 View Result Date: 07/18/2024 CLINICAL DATA:  Shortness of breath EXAM: PORTABLE CHEST 1 VIEW COMPARISON:  07/11/2024, 10/15/2022 FINDINGS: Moderate to large hiatal hernia. Cardiomegaly with aortic atherosclerosis. Possible minimal patchy atelectasis or infiltrate left base. Old rib fractures IMPRESSION: Cardiomegaly. Possible minimal patchy atelectasis or infiltrate at the left base. Moderate to large hiatal hernia. Electronically Signed   By: Luke Bun M.D.   On: 07/18/2024 23:44   Procedures   Medications Ordered in the ED  furosemide  (LASIX ) injection 80 mg (80 mg Intravenous Given 07/19/24 0014)                                Medical Decision Making Amount and/or Complexity of  Data Reviewed Labs: ordered. Radiology: ordered.  Risk Prescription drug management. Decision regarding hospitalization.   Patient is a 89 year old female presenting today for evaluation of acute onset shortness of breath.  On initial assessment patient was on positive pressure via EMS.  Lung sounds with mild rhonchi bilaterally.  Patient was transition to BiPAP here in the emergency department for further respiratory support.  Blood pressure stable at this point in time.  Patient was noted to be in A-fib with well-controlled rates.  Physical examination also notable for lower extremity edema.  I did perform a bedside ultrasound that showed relatively preserved ejection fraction and pathologic B-lines in all lung fields.  No evidence of significant effusion.  Laboratory evaluation with unremarkable metabolic panel and CBC.  VBG without any evidence of hypercapnic respiratory failure.  Elevated BNP to 1697.  Chest x-ray with evidence of cardiomegaly.  On initial assessment patient had improvement of overall respiratory status.  Was trialed off BiPAP and weaned down to 2 L nasal cannula.  Patient's respiratory effort significantly improved at this point in time.  Overall presentation concerning for acute heart failure exacerbation.  Patient's BNP is elevated from prior and will likely need admission at this time for further volume optimization.  Patient was provided a Lasix  for initial diuresis.   Final diagnoses:  Chest pain, unspecified type  Acute on chronic diastolic congestive heart failure [facility])    ED Discharge Orders     None          Laurita Sieving, MD 07/19/24 0017    Bari Flank HERO, DO 07/25/24 9245  "

## 2024-07-19 ENCOUNTER — Encounter (HOSPITAL_COMMUNITY): Payer: Self-pay | Admitting: Hospitalist

## 2024-07-19 ENCOUNTER — Inpatient Hospital Stay (HOSPITAL_COMMUNITY)

## 2024-07-19 DIAGNOSIS — R54 Age-related physical debility: Secondary | ICD-10-CM | POA: Diagnosis present

## 2024-07-19 DIAGNOSIS — E785 Hyperlipidemia, unspecified: Secondary | ICD-10-CM | POA: Diagnosis present

## 2024-07-19 DIAGNOSIS — Z79899 Other long term (current) drug therapy: Secondary | ICD-10-CM | POA: Diagnosis not present

## 2024-07-19 DIAGNOSIS — J9601 Acute respiratory failure with hypoxia: Secondary | ICD-10-CM | POA: Diagnosis present

## 2024-07-19 DIAGNOSIS — I10 Essential (primary) hypertension: Secondary | ICD-10-CM | POA: Diagnosis not present

## 2024-07-19 DIAGNOSIS — I480 Paroxysmal atrial fibrillation: Secondary | ICD-10-CM | POA: Diagnosis present

## 2024-07-19 DIAGNOSIS — R079 Chest pain, unspecified: Secondary | ICD-10-CM | POA: Diagnosis not present

## 2024-07-19 DIAGNOSIS — I2489 Other forms of acute ischemic heart disease: Secondary | ICD-10-CM | POA: Diagnosis present

## 2024-07-19 DIAGNOSIS — F32A Depression, unspecified: Secondary | ICD-10-CM | POA: Diagnosis present

## 2024-07-19 DIAGNOSIS — Z7901 Long term (current) use of anticoagulants: Secondary | ICD-10-CM | POA: Diagnosis not present

## 2024-07-19 DIAGNOSIS — K449 Diaphragmatic hernia without obstruction or gangrene: Secondary | ICD-10-CM | POA: Diagnosis present

## 2024-07-19 DIAGNOSIS — Z6821 Body mass index (BMI) 21.0-21.9, adult: Secondary | ICD-10-CM | POA: Diagnosis not present

## 2024-07-19 DIAGNOSIS — I5023 Acute on chronic systolic (congestive) heart failure: Secondary | ICD-10-CM | POA: Diagnosis not present

## 2024-07-19 DIAGNOSIS — I5033 Acute on chronic diastolic (congestive) heart failure: Secondary | ICD-10-CM | POA: Insufficient documentation

## 2024-07-19 DIAGNOSIS — I214 Non-ST elevation (NSTEMI) myocardial infarction: Secondary | ICD-10-CM | POA: Diagnosis not present

## 2024-07-19 DIAGNOSIS — N182 Chronic kidney disease, stage 2 (mild): Secondary | ICD-10-CM | POA: Diagnosis present

## 2024-07-19 DIAGNOSIS — I509 Heart failure, unspecified: Secondary | ICD-10-CM

## 2024-07-19 DIAGNOSIS — I130 Hypertensive heart and chronic kidney disease with heart failure and stage 1 through stage 4 chronic kidney disease, or unspecified chronic kidney disease: Secondary | ICD-10-CM | POA: Diagnosis present

## 2024-07-19 DIAGNOSIS — E875 Hyperkalemia: Secondary | ICD-10-CM | POA: Diagnosis present

## 2024-07-19 DIAGNOSIS — E43 Unspecified severe protein-calorie malnutrition: Secondary | ICD-10-CM | POA: Diagnosis present

## 2024-07-19 DIAGNOSIS — G8929 Other chronic pain: Secondary | ICD-10-CM | POA: Diagnosis present

## 2024-07-19 DIAGNOSIS — I160 Hypertensive urgency: Secondary | ICD-10-CM | POA: Diagnosis present

## 2024-07-19 DIAGNOSIS — Z79891 Long term (current) use of opiate analgesic: Secondary | ICD-10-CM | POA: Diagnosis not present

## 2024-07-19 DIAGNOSIS — I447 Left bundle-branch block, unspecified: Secondary | ICD-10-CM | POA: Diagnosis present

## 2024-07-19 DIAGNOSIS — Z66 Do not resuscitate: Secondary | ICD-10-CM | POA: Diagnosis present

## 2024-07-19 DIAGNOSIS — Z8249 Family history of ischemic heart disease and other diseases of the circulatory system: Secondary | ICD-10-CM | POA: Diagnosis not present

## 2024-07-19 LAB — BASIC METABOLIC PANEL WITH GFR
Anion gap: 9 (ref 5–15)
BUN: 16 mg/dL (ref 8–23)
CO2: 35 mmol/L — ABNORMAL HIGH (ref 22–32)
Calcium: 9.2 mg/dL (ref 8.9–10.3)
Chloride: 95 mmol/L — ABNORMAL LOW (ref 98–111)
Creatinine, Ser: 0.85 mg/dL (ref 0.44–1.00)
GFR, Estimated: >60 mL/min
Glucose, Bld: 128 mg/dL — ABNORMAL HIGH (ref 70–99)
Potassium: 3.6 mmol/L (ref 3.5–5.1)
Sodium: 139 mmol/L (ref 135–145)

## 2024-07-19 LAB — ECHOCARDIOGRAM LIMITED
Area-P 1/2: 4.39 cm2
S' Lateral: 3 cm

## 2024-07-19 LAB — TROPONIN T, HIGH SENSITIVITY: Troponin T High Sensitivity: 82 ng/L — ABNORMAL HIGH (ref 0–19)

## 2024-07-19 MED ORDER — AMIODARONE HCL 200 MG PO TABS
200.0000 mg | ORAL_TABLET | Freq: Every day | ORAL | Status: DC
  Administered 2024-07-19: 200 mg via ORAL
  Filled 2024-07-19: qty 1

## 2024-07-19 MED ORDER — ORAL CARE MOUTH RINSE: 15.0000 mL | OROMUCOSAL | Status: DC | PRN

## 2024-07-19 MED ORDER — PROSIGHT PO TABS
1.0000 | ORAL_TABLET | Freq: Every day | ORAL | Status: DC
  Administered 2024-07-20 – 2024-07-22 (×3): 1 via ORAL
  Filled 2024-07-19 (×3): qty 1

## 2024-07-19 MED ORDER — POLYVINYL ALCOHOL 1.4 % OP SOLN: 1.0000 [drp] | OPHTHALMIC | Status: DC | PRN

## 2024-07-19 MED ORDER — FUROSEMIDE 10 MG/ML IJ SOLN
40.0000 mg | Freq: Two times a day (BID) | INTRAMUSCULAR | Status: DC
  Administered 2024-07-19: 40 mg via INTRAVENOUS
  Filled 2024-07-19 (×2): qty 4

## 2024-07-19 MED ORDER — LORATADINE 10 MG PO TABS
10.0000 mg | ORAL_TABLET | Freq: Every day | ORAL | Status: DC
  Administered 2024-07-19 – 2024-07-22 (×4): 10 mg via ORAL
  Filled 2024-07-19 (×4): qty 1

## 2024-07-19 MED ORDER — PREGABALIN 25 MG PO CAPS
50.0000 mg | ORAL_CAPSULE | Freq: Three times a day (TID) | ORAL | Status: DC
  Administered 2024-07-19: 25 mg via ORAL
  Administered 2024-07-19 (×2): 50 mg via ORAL
  Filled 2024-07-19 (×3): qty 2

## 2024-07-19 MED ORDER — ATORVASTATIN CALCIUM 10 MG PO TABS
20.0000 mg | ORAL_TABLET | Freq: Every day | ORAL | Status: DC
  Administered 2024-07-19 – 2024-07-22 (×4): 20 mg via ORAL
  Filled 2024-07-19 (×2): qty 2
  Filled 2024-07-19: qty 1
  Filled 2024-07-19: qty 2

## 2024-07-19 MED ORDER — ENOXAPARIN SODIUM 40 MG/0.4ML IJ SOSY: 40.0000 mg | PREFILLED_SYRINGE | INTRAMUSCULAR | Status: DC

## 2024-07-19 MED ORDER — EDOXABAN TOSYLATE 30 MG PO TABS
30.0000 mg | ORAL_TABLET | ORAL | Status: DC
  Administered 2024-07-19 – 2024-07-21 (×3): 30 mg via ORAL
  Filled 2024-07-19 (×5): qty 1

## 2024-07-19 MED ORDER — IPRATROPIUM-ALBUTEROL 0.5-2.5 (3) MG/3ML IN SOLN: 3.0000 mL | Freq: Four times a day (QID) | RESPIRATORY_TRACT | Status: DC | PRN

## 2024-07-19 MED ORDER — OXYCODONE HCL 5 MG PO TABS
5.0000 mg | ORAL_TABLET | ORAL | Status: DC | PRN
  Administered 2024-07-19 – 2024-07-20 (×2): 5 mg via ORAL
  Filled 2024-07-19 (×3): qty 1

## 2024-07-19 MED ORDER — SODIUM ZIRCONIUM CYCLOSILICATE 10 G PO PACK
10.0000 g | PACK | Freq: Once | ORAL | Status: AC
  Administered 2024-07-19: 10 g via ORAL
  Filled 2024-07-19: qty 1

## 2024-07-19 MED ORDER — POLYETHYLENE GLYCOL 3350 17 G PO PACK
17.0000 g | PACK | Freq: Two times a day (BID) | ORAL | Status: DC
  Administered 2024-07-19 – 2024-07-22 (×7): 17 g via ORAL
  Filled 2024-07-19 (×6): qty 1

## 2024-07-19 NOTE — H&P (Signed)
 " History and Physical    Patient: Tammy Walker FMW:[id number] DOB: [birth year] DOA: 07/18/2024 DOS: the patient was seen and examined on 07/19/2024 PCP: Alben Therisa MATSU, PA  Patient coming from: Home  Chief Complaint:  Chief Complaint  Patient presents with   Respiratory Distress   Chest Pain   HPI: ANGELES Walker is a 89 y.o. female with medical history significant of Afib, HTN, HLD, and osteoporosis who p/w L sided cp and rising troponins c/w NSTEMI.  The patient experienced severe and unbearable chest pain rated as an 18 on a scale of 1 to 10. The pain began at approximately 9:45 PM the previous evening after using the bathroom. The pain was located in the shoulders and arms, and was accompanied by difficulty breathing and elevated blood pressure. The patient's blood pressure was reported to be as high as 200 over something when EMS was called. The current pain was noted to be different from the usual sternum pain, as it was higher and radiated across the chest into the shoulders and upper arms. The patient also experienced loud breathing difficulties with this cp episode.   In the ED, pt tachypneic w/ hypoxia (initially required BiPAP > 2L [place] > RA; on RA pta). Labs notable for VBG 7.4/45, K 4.7>7.9 (possible spurious; re-draw pending), troponin 15>82, and pro-BNP 1697 (>1800 c/w ADHF by age). EDP started IV lasix  80mg  x1 and requested medicine admission.   Review of Systems: As mentioned in the history of present illness. All other systems reviewed and are negative. Past Medical History:  Diagnosis Date   Allergy    Arrhythmia    Atrial fibrillation (HCC)    GERD (gastroesophageal reflux disease)    History of bone scan    Bone Density Scans 2017 & 2019 Dr. Malcolm    History of chest x-ray    Apart from large hiatus hernia, normal heart, lungs and mediastinum. Per records from [facility]     History of CT scan of abdomen 10/24/2017   Gallstones within a  thin-waled gallbladder, Per records [facility]    History of echocardiogram    [facility] &  following   [facility]    History of EKG    Sinus rhythm, borderline 1st deg block, LAD completely changed axis, LBBB(old). Per records from [facility]    Hyperlipidemia    Hypertension    Insomnia    Left lower lobe pneumonia 09/20/2015   Per records from [facility]    Nodule of right lung    Per records from [facility],    Osteoporosis    on fosamax   Osteoporosis    Reduced mobility    Past Surgical History:  Procedure Laterality Date   APPENDECTOMY  [address] [place]    CARDIOVERSION N/A 10/29/2022   Procedure: CARDIOVERSION;  Surgeon: Francyne Headland, MD;  Location: [facility] INVASIVE CV LAB;  Service: Cardiovascular;  Laterality: N/A;   CARDIOVERSION N/A 12/01/2022   Procedure: CARDIOVERSION;  Surgeon: Santo Stanly LABOR, MD;  Location: [facility] INVASIVE CV LAB;  Service: Cardiovascular;  Laterality: N/A;   CARDIOVERSION N/A 07/12/2024   Procedure: CARDIOVERSION;  Surgeon: Raford Riggs, MD;  Location: [facility] INVASIVE CV LAB;  Service: Cardiovascular;  Laterality: N/A;   CT SCAN     2017, 2018, 2019 -- re lungs see records    TRANSESOPHAGEAL ECHOCARDIOGRAM (CATH LAB) N/A 07/12/2024   Procedure: TRANSESOPHAGEAL ECHOCARDIOGRAM;  Surgeon: Raford Riggs, MD;  Location:  [facility] INVASIVE CV LAB;  Service: Cardiovascular;  Laterality: N/A;   TUBAL LIGATION  1978   [facility] [place], [place]    Social History:  reports that she has never smoked. She has never used smokeless tobacco. She reports that she does not currently use alcohol . She reports that she does not use drugs.  Allergies[1]  Family History  Problem Relation Age of Onset   Heart disease Mother    High blood pressure Mother    COPD Mother    Heart failure Mother    Heart disease Father    Heart disease Brother    Heart failure Brother    Hyperlipidemia  Daughter    Hypertension Daughter     Prior to Admission medications  Medication Sig Start Date End Date Taking? Authorizing Provider  acetaminophen  (TYLENOL ) 500 MG tablet 2 tablets in the AM, 1 tablet in the afternoon, and 1 tablet at night Orally three times a day Patient taking differently: Take 500-1,000 mg by mouth See admin instructions. Take 1-2 tablets (500mg -1000mg ) by mouth at 0800 depending on pain, take 1 tablet (500mg ) around 1400, and take 1 tablet at 2000.   Yes [provider]  amiodarone  (PACERONE ) 200 MG tablet Take 1 tablet (200 mg total) by mouth daily. 07/13/24  Yes Patsy Lenis, MD  atorvastatin  (LIPITOR) 20 MG tablet TAKE 1 TABLET(20 MG) BY MOUTH AT BEDTIME 09/28/23   Jeffrie Oneil BROCKS, MD  bisacodyl  (DULCOLAX) 5 MG EC tablet Take 10 mg by mouth at bedtime.    [provider]  Calcium  Carb-Cholecalciferol  (CALCIUM  + VITAMIN D3 PO) Take 1 tablet by mouth 2 (two) times daily with a meal.    [provider]  Calcium  Carb-Cholecalciferol  (CHEWABLE CALCIUM /D3 PO) Take 2 each by mouth 2 (two) times daily with a meal. Vitafusion; calcium  + D3 gummies    [provider]  carboxymethylcellulose (REFRESH PLUS) 0.5 % SOLN Place 1 drop into both eyes every 4 (four) hours as needed (eye irritatation).    [provider]  cetirizine (ZYRTEC) 10 MG tablet Take 10 mg by mouth daily.    [provider]  clotrimazole (LOTRIMIN) 1 % cream Apply 1 application  topically 2 (two) times daily as needed (skin irritation).    [provider]  denosumab  (PROLIA ) 60 MG/ML SOSY injection Inject 60 mg into the skin every 6 (six) months.    [provider]  diclofenac  Sodium (VOLTAREN ) 1 % GEL Apply 1 Application topically 4 (four) times daily as needed (pain).    [provider]  edoxaban  (SAVAYSA ) 30 MG TABS tablet Take 1 tablet (30 mg total) by mouth daily. Patient taking differently: Take 30 mg by mouth at bedtime. 08/03/23    Jeffrie Oneil BROCKS, MD  furosemide  (LASIX ) 20 MG tablet Take 1 tablet (20 mg total) by mouth 3 (three) times a week. 07/13/24 07/13/25  Patsy Lenis, MD  hydrocortisone  (ANUSOL -HC) 2.5 % rectal cream Apply 1 Application topically 2 (two) times daily as needed for hemorrhoids (rectal pain). 03/21/24   [provider]  Hydrocortisone  (PREPARATION H EX) Apply 1 Application topically every 6 (six) hours as needed (rectal pain, hemorroids).    [provider]  ipratropium (ATROVENT ) 0.03 % nasal spray Place 2 sprays into both nostrils every 12 (twelve) hours. 05/16/24   Masciello, Hadassah BROCKS, MD  LINZESS 72 MCG capsule Take 72 mcg by mouth daily as needed. Patient taking differently: Take 72 mcg by mouth daily as needed (severe constipation). 06/10/23  [provider]  Multiple Vitamins-Minerals (PRESERVISION AREDS 2 PO) Take 1 capsule by mouth See admin instructions. Take 1 capsule by mouth twice a day, with lunch and dinner.    [provider]  ondansetron  (ZOFRAN ) 4 MG tablet Take 1 tablet (4 mg total) by mouth every 8 (eight) hours as needed for nausea or vomiting. 09/09/21   Rai, Nydia POUR, MD  oxyCODONE  (OXY IR/ROXICODONE ) 5 MG immediate release tablet Take 0.5 tablets (2.5 mg total) by mouth every 12 (twelve) hours as needed for severe pain (pain score 7-10). 06/02/24   Emeline Joesph BROCKS, DO  oxyCODONE  (OXYCONTIN ) 10 mg 12 hr tablet Take 1 tablet (10 mg total) by mouth every 12 (twelve) hours. 06/20/24   Urbano Albright, MD  phenylephrine  (,USE FOR PREPARATION-H,) 0.25 % suppository Place 1 suppository rectally 2 (two) times daily as needed for hemorrhoids (rectal pain).    [provider]  polyethylene glycol (MIRALAX  / GLYCOLAX ) 17 g packet Take 17 g by mouth 2 (two) times daily.    [provider]  pregabalin  (LYRICA ) 25 MG capsule Take 1 capsule (25 mg total) by mouth See admin instructions. Taking 75 mg by mouth in the morning, 25 mg afternoon and  25 mg in the evening Patient taking differently: Take 25 mg by mouth See admin instructions. Take 1 capsule (25mg ) by mouth twice daily, at 1400 and at 2000. 04/19/24   Urbano Albright, MD  pregabalin  (LYRICA ) 75 MG capsule Take 1 capsule (75 mg total) by mouth daily. 04/19/24   Urbano Albright, MD  sodium chloride  (OCEAN) 0.65 % nasal spray Place 1-2 sprays into the nose every 4 (four) hours as needed (sinus irritation).    [provider]  triamcinolone cream (KENALOG) 0.1 % 1 Application. Patient taking differently: Apply 1 Application topically 2 (two) times daily as needed (skin irritation). 08/24/23   [provider]  White Petrolatum-Mineral Oil (SYSTANE NIGHTTIME) OINT Place 1 application  into both eyes at bedtime as needed (dry eyes).    [provider]    Physical Exam: Vitals:   07/19/24 0615 07/19/24 0630 07/19/24 0646 07/19/24 0700  BP:  113/78  124/69  Pulse:  81  66  Resp: 15 20  15   Temp:   (!) 96.9 F (36.1 C)   TempSrc:   Axillary   SpO2:  100%  100%  Weight:      Height:       General: Alert, oriented x3, resting comfortably in no acute distress Respiratory: Lungs clear to auscultation bilaterally with normal respiratory effort; no w/r/r Cardiovascular: Regular rate and rhythm w/o m/r/g Abdomen: Soft, nontender, nondistended. Positive bowel sounds   Data Reviewed:  Lab Results  Component Value Date   WBC 8.0 07/18/2024   HGB 14.3 07/18/2024   HCT 42.0 07/18/2024   MCV 95.7 07/18/2024   PLT 302 07/18/2024   Lab Results  Component Value Date   GLUCOSE 166 (H) 07/18/2024   CALCIUM  9.4 07/18/2024   NA 132 (L) 07/18/2024   K 7.9 (HH) 07/18/2024   CO2 27 07/18/2024   CL 96 (L) 07/18/2024   BUN 14 07/18/2024   CREATININE 0.90 07/18/2024   Lab Results  Component Value Date   ALT 12 07/18/2024   AST 28 07/18/2024   ALKPHOS 67 07/18/2024   BILITOT 0.6 07/18/2024   Lab Results  Component Value Date   INR 1.2 09/05/2021    INR 1.1 09/23/2020   INR 1.8 (H) 09/19/2016   Radiology:  DG Chest Portable 1 View Result Date: 07/18/2024 CLINICAL DATA:  Shortness of breath EXAM: PORTABLE CHEST 1 VIEW COMPARISON:  07/11/2024, 10/15/2022 FINDINGS: Moderate to large hiatal hernia. Cardiomegaly with aortic atherosclerosis. Possible minimal patchy atelectasis or infiltrate left base. Old rib fractures IMPRESSION: Cardiomegaly. Possible minimal patchy atelectasis or infiltrate at the left base. Moderate to large hiatal hernia. Electronically Signed   By: Luke Bun M.D.   On: 07/18/2024 23:44    Assessment and Plan: 89F  h/o Afib, HTN, HLD, and osteoporosis who p/w L sided cp and rising troponins c/w NSTEMI.  #NSTEMI #Atypical cp Pt with pain at rest; 18/10 per report -Cards consulted; apprec eval/recs -PTA edoxaban  30mg  nightly (reduced due to <60kg)  Afib -PTA amiodarone  and edoxaban  -IV lasix  40mg  BID for now; resume pta lasix  prior to discharge  Hyperkalemia -Lokelma  10mg  x1 -F/u BMP and treat accordingly  Chronic back pain Osteoporosis -PTA oxycodone  and Lyrica    Advance Care Planning:   Code Status: Full Code   Consults: Cards  Family Communication: Daughter, Dagoberto  Severity of Illness: The appropriate patient status for this patient is INPATIENT. Inpatient status is judged to be reasonable and necessary in order to provide the required intensity of service to ensure the patient's safety. The patient's presenting symptoms, physical exam findings, and initial radiographic and laboratory data in the context of their chronic comorbidities is felt to place them at high risk for further clinical deterioration. Furthermore, it is not anticipated that the patient will be medically stable for discharge from the hospital within 2 midnights of admission.   * I certify that at the point of admission it is my clinical judgment that the patient will require inpatient hospital care spanning beyond 2 midnights from  the point of admission due to high intensity of service, high risk for further deterioration and high frequency of surveillance required.*   ------- I spent 57 minutes reviewing previous notes, at the bedside counseling/discussing the treatment plan, and performing clinical documentation.  Author: Marsha Ada, MD 07/19/2024 7:34 AM  For on call review [email or web address].      [1] No Known Allergies  "

## 2024-07-19 NOTE — Progress Notes (Signed)
 Tammy Walker

## 2024-07-19 NOTE — Progress Notes (Signed)
 Echocardiogram 2D Echocardiogram has been performed.  Juliene JINNY Rucks 07/19/2024, 5:24 PM

## 2024-07-19 NOTE — Progress Notes (Signed)
 Pt taken off BIPAP per MD, pt placed on 2L [place].

## 2024-07-19 NOTE — Consult Note (Addendum)
 "  Cardiology Consultation   Patient ID: Tammy Walker MRN: [id number]; DOB: 1931-01-17  Admit date: 07/18/2024 Date of Consult: 07/19/2024  PCP:  Alben Therisa MATSU, PA   [facility] Providers Cardiologist:  Oneil Parchment, MD        Patient Profile: Tammy Walker is a 89 y.o. female with a hx of chronic HFmrEF, longstanding atrial fibrillation on chronic anticoagulation, first-degree AV block, LBBB, aortic atherosclerosis, mitral regurgitation, CKD II, HTN, HLD, osteoporosis who is being seen 07/19/2024 for the evaluation of chest pain at the request of Dr. Marsha Ada.  History of Present Illness: Ms. Tammy Walker has medical history as stated above. Follows routinely with Dr. Parchment and Dr. Waddell. Patient was initially diagnosed with atrial fibrillation in 2022 with history of multiple treatment regimens. Was on amiodarone  for years but was discontinued due to eye deposits seen by ophthalmology in 2021. Went back into atrial fibrillation during hospitalization in 2022, was started on digoxin  and metoprolol  and converted back to NSR. Metoprolol  was later stopped in 2023 due to dizziness and hypotension. Started on Multaq  in April 2024 during hospitalization for SBO, discharged on 10/17/22 on Multaq  400 mg BID and Lasix  20 mg daily. Followed up in the [facility] on 10/20/22 and was found to be in atrial fibrillation again. S/p successful cardioversion to NSR on 10/29/22. However, at 11/05/2022 clinic follow-up, patient was back in A-fib. S/p succesful cardioversion on 12/01/22 but was found to be in rate-controlled atrial flutter at follow-up on 12/17/22. Amiodarone  200 mg daily was ultimately restarted due to recurrent atrial fibrillation. Discussed with Dr. Parchment regarding interventions including AV nodal ablation and pacemaker, however, patient was continued on amiodarone  due to patient preference and effective rate control. Amiodarone  dose was lowered to 100 mg daily and  Lasix  was discontinued in July 2025 due to hypotension.  Recently hospitalized in January 2026 for atrial fibrillation and CHF. Found to be in atrial fibrillation with RVR, however, TEE showed small mobile LAA thrombus and cardioversion was not performed. Discharged on 07/13/24 on amiodarone  200 mg daily, Savaysa  30 mg daily, Lasix  20 mg TID.   Presented to ED on 1/19 complaining of chest pain and dyspnea. On arrival was tachypneic with hypoxia initially requiring BiPAP, then weaned down to room air. Troponin 15 >> 82. ProBNP 1,697 (below abnormal threshold of >1,800 based on patient age). Of note, patient's proBNP was 1,459 last week during recent admission. CMP and CBC unremarkable. CXR showed cardiomegaly with left base infiltrate. Given IV Lasix  80 mg x 1 dose and PO amiodarone  200 mg in the ED. Hospital medicine to admit with cardiology to consult. Echo still pending at this time.  Upon speaking to the patient, she describes yesterday's pain as severe substernal chest pain with radiation to her left shoulder accompanied by sudden dyspnea and elevated blood pressure. Occurred while walking from living room to kitchen. She states that she has had chronic chest pain for the past 10-12 years and this episode of chest pain felt similar in character to her usual chest pain, but the associated dyspnea and elevated blood pressure is new for her. She states that she has been evaluated for her chest pain in the past and the etiology remains unclear, but there is no ischemic workup shown in her EMR. She typically manages her pain with heated rice pads, which works well for her. And she takes oxycodone , pregabalin , and acetaminophen  for chronic pain due to She also reports that her  blood pressure was elevated as high as 200 during this episode of chest pain. Usually runs soft at baseline. She notes that her chest pain and dyspnea has nearly resolved once she was placed on BiPAP. She denies any chest pain or dyspnea  currently.   Past Medical History:  Diagnosis Date   Allergy    Arrhythmia    Atrial fibrillation (HCC)    GERD (gastroesophageal reflux disease)    History of bone scan    Bone Density Scans 2017 & 2019 Dr. Malcolm    History of chest x-ray    Apart from large hiatus hernia, normal heart, lungs and mediastinum. Per records from [facility]     History of CT scan of abdomen 10/24/2017   Gallstones within a thin-waled gallbladder, Per [facility]    [facility] of echocardiogram    [facility]    History of EKG    Sinus rhythm, borderline 1st deg block, LAD completely changed axis, LBBB(old). Per records from [facility]    Hyperlipidemia    Hypertension    Insomnia    Left lower lobe pneumonia 09/20/2015   Per records from [facility]    Nodule of right lung    Per records from [facility],    Osteoporosis    on fosamax   Osteoporosis    Reduced mobility     Past Surgical History:  Procedure Laterality Date   APPENDECTOMY  [address] [place]    CARDIOVERSION N/A 10/29/2022   Procedure: CARDIOVERSION;  Surgeon: Francyne Headland, MD;  Location: [facility] INVASIVE CV LAB;  Service: Cardiovascular;  Laterality: N/A;   CARDIOVERSION N/A 12/01/2022   Procedure: CARDIOVERSION;  Surgeon: Santo Stanly LABOR, MD;  Location: [facility] INVASIVE CV LAB;  Service: Cardiovascular;  Laterality: N/A;   CARDIOVERSION N/A 07/12/2024   Procedure: CARDIOVERSION;  Surgeon: Raford Riggs, MD;  Location: [facility] INVASIVE CV LAB;  Service: Cardiovascular;  Laterality: N/A;   CT SCAN     2017, 2018, 2019 -- re lungs see records    TRANSESOPHAGEAL ECHOCARDIOGRAM (CATH LAB) N/A 07/12/2024   Procedure: TRANSESOPHAGEAL ECHOCARDIOGRAM;  Surgeon: Raford Riggs, MD;  Location: [facility] INVASIVE CV LAB;  Service: Cardiovascular;  Laterality: N/A;   TUBAL LIGATION  1978   [facility], [place], [place]         Scheduled Meds:  amiodarone   200 mg Oral Daily   atorvastatin   20 mg Oral Daily   edoxaban   30 mg Oral Q24H   furosemide   40 mg Intravenous BID   loratadine   10 mg Oral Daily   polyethylene glycol  17 g Oral BID   pregabalin   50 mg Oral TID   PreserVision AREDS 2   Oral See admin instructions   Continuous Infusions:  PRN Meds: carboxymethylcellulose, ipratropium-albuterol , oxyCODONE   Allergies:   Allergies[1]  Social History:   Social History   Socioeconomic History   Marital status: Widowed    Spouse name: Not on file   Number of children: Not on file   Years of education: Not on file   Highest education level: Not on file  Occupational History   Not on file  Tobacco Use   Smoking status: Never   Smokeless tobacco: Never   Tobacco comments:    Never smoked 05/21/23  Vaping Use   Vaping status: Never Used  Substance and Sexual Activity   Alcohol  use: Not Currently    Comment: less than  one drink a week   Drug use: No   Sexual activity: Never    Birth control/protection: None  Other Topics Concern   Not on file  Social History Narrative   Social History      Diet? I watch my intake of salt, sugar, and fat      Do you drink/eat things with caffeine? Yes       Marital status?      Widowed                               What year were you married? 1960      Do you live in a house, apartment, assisted living, condo, trailer, etc.? House       Is it one or more stories? one      How many persons live in your home? 2      Do you have any pets in your home? (please list) yes 2 cats       Highest level of education completed? 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             yes                         Type & how often? Strength & Balance       Advanced Directives      Do you have a living will?  No       Do you have a DNR form?        No                            If not, do you want to discuss one? No       Do you have signed POA/HPOA for forms? Yes       Functional Status      Do you have difficulty bathing or dressing yourself? No       Do you have difficulty preparing food or eating? No       Do you have difficulty managing your medications? No       Do you have difficulty managing your finances? No       Do you have difficulty affording your medications? No       Social Drivers of Health   Tobacco Use: Low Risk (07/12/2024)   Patient History    Smoking Tobacco Use: Never    Smokeless Tobacco Use: Never    Passive Exposure: Not on file  Financial Resource Strain: Not on file  Food Insecurity: No Food Insecurity (07/11/2024)   Epic    Worried About Programme Researcher, Broadcasting/film/video in the Last Year: Never true    Ran Out of Food in the Last Year: Never true  Transportation Needs: No Transportation Needs (07/11/2024)   Epic    Lack of Transportation (Medical): No    Lack of Transportation (Non-Medical): No  Physical Activity: Not on file  Stress: Not on file  Social Connections: Moderately Integrated (07/11/2024)   Social Connection and Isolation Panel    Frequency of Communication with Friends and Family: More than three times a week    Frequency of Social Gatherings with Friends and Family: More than  three times a week    Attends Religious Services: More than 4 times per year    Active Member of Clubs or Organizations: Yes    Attends Banker Meetings: More than 4 times per year    Marital Status: Widowed  Intimate Partner Violence: Not At Risk (07/11/2024)   Epic    Fear of Current or Ex-Partner: No    Emotionally Abused: No    Physically Abused: No    Sexually Abused: No  Depression (PHQ2-9): Low Risk (04/14/2024)   Depression (PHQ2-9)    PHQ-2 Score: 0  Alcohol  Screen: Not on file  Housing: Low Risk (07/11/2024)   Epic    Unable to Pay for Housing in the Last Year: No    Number of Times Moved in the Last Year: 0     Homeless in the Last Year: No  Utilities: Not At Risk (07/11/2024)   Epic    Threatened with loss of utilities: No  Health Literacy: Not on file    Family History:    Family History  Problem Relation Age of Onset   Heart disease Mother    High blood pressure Mother    COPD Mother    Heart failure Mother    Heart disease Father    Heart disease Brother    Heart failure Brother    Hyperlipidemia Daughter    Hypertension Daughter      ROS:  Please see the history of present illness.   All other ROS reviewed and negative.     Physical Exam/Data: Vitals:   07/19/24 1000 07/19/24 1031 07/19/24 1316 07/19/24 1627  BP: (!) 107/59  111/68 138/80  Pulse: 72  89 79  Resp: 19  (!) 22 18  Temp:  (!) 97 F (36.1 C) 97.7 F (36.5 C)   TempSrc:  Oral Oral   SpO2: 100%  98% 100%  Weight:      Height:        Intake/Output Summary (Last 24 hours) at 07/19/2024 1719 Last data filed at 07/19/2024 0224 Gross per 24 hour  Intake --  Output 1000 ml  Net -1000 ml      07/18/2024   10:56 PM 07/13/2024    3:42 AM 07/12/2024   11:58 AM  Last 3 Weights  Weight (lbs) 116 lb 8 oz 116 lb 8 oz 115 lb  Weight (kg) 52.844 kg 52.844 kg 52.164 kg     Body mass index is 23.53 kg/m.  General: Frail, chronically-appearing elderly female resting in bed, on 2L/min O2 via [place], in no acute distress Neck: No JVD Vascular: Distal pulses 2+ bilaterally Cardiac: Normal S1, S2; irregularly irregular rhythm, normal rate; holosystolic murmur Lungs: Clear to auscultation bilaterally, no wheezing, rhonchi or rales  Abd: Soft, nontender Ext: No peripheral edema Musculoskeletal: Extreme curvature of spine Skin: Warm and dry  Neuro: No focal abnormalities noted Psych: Normal affect   EKG: The EKG was personally reviewed and demonstrates: Atrial fibrillation with chronic LBBB, rate 106 bpm Telemetry:  Telemetry was personally reviewed and demonstrates: Rate-controlled atrial fibrillation with chronic  LBBB   Relevant CV Studies:  Echo [07/12/24]: 1. Left ventricular ejection fraction, by estimation, is 30 to 35% . The left ventricle has moderately decreased function. The left ventricle demonstrates global hypokinesis. 2. Right ventricular systolic function is normal. The right ventricular size is normal. 3. There is a small mobile density in the left atrial appendage with motion independent of the atrium. Consistent with thrombus. Measures 0.  7 x 0. 3 cm. Left atrial size was severely dilated. A left atrial/ left atrial appendage thrombus was detected. The LAA emptying velocity was 21 cm/ s. 4. Right atrial size was severely dilated. 5. The mitral valve is normal in structure. Moderate mitral valve regurgitation. No evidence of mitral stenosis. 6. The aortic valve is tricuspid. Aortic valve regurgitation is mild. No aortic stenosis is present. 7. The inferior vena cava is normal in size with greater than 50% respiratory variability, suggesting right atrial pressure of 3 mmHg.   Laboratory Data: High Sensitivity Troponin:  No results for input(s): TROPONINIHS in the last 720 hours.  Recent Labs  Lab 07/11/24 0238 07/11/24 0407 07/18/24 2253 07/19/24 0247  TRNPT <15 <15 15 82*      Chemistry Recent Labs  Lab 07/13/24 0312 07/18/24 2253 07/18/24 2311  NA 138 135 132*  K 4.0 4.7 7.9*  CL 100 96*  --   CO2 31 27  --   GLUCOSE 85 166*  --   BUN 9 14  --   CREATININE 0.68 0.90  --   CALCIUM  8.1* 9.4  --   GFRNONAA >60 >60  --   ANIONGAP 6 12  --     Recent Labs  Lab 07/18/24 2253  PROT 7.2  ALBUMIN 4.2  AST 28  ALT 12  ALKPHOS 67  BILITOT 0.6   Lipids No results for input(s): CHOL, TRIG, HDL, LABVLDL, LDLCALC, CHOLHDL in the last 168 hours.  Hematology Recent Labs  Lab 07/18/24 2253 07/18/24 2311  WBC 8.0  --   RBC 4.63  --   HGB 14.1 14.3  HCT 44.3 42.0  MCV 95.7  --   MCH 30.5  --   MCHC 31.8  --   RDW 14.6  --   PLT 302  --    Thyroid  No  results for input(s): TSH, FREET4 in the last 168 hours.  BNP Recent Labs  Lab 07/18/24 2253  PROBNP 1,697.0*    DDimer No results for input(s): DDIMER in the last 168 hours.  Radiology/Studies:  DG Chest Portable 1 View Result Date: 07/18/2024 CLINICAL DATA:  Shortness of breath EXAM: PORTABLE CHEST 1 VIEW COMPARISON:  07/11/2024, 10/15/2022 FINDINGS: Moderate to large hiatal hernia. Cardiomegaly with aortic atherosclerosis. Possible minimal patchy atelectasis or infiltrate left base. Old rib fractures IMPRESSION: Cardiomegaly. Possible minimal patchy atelectasis or infiltrate at the left base. Moderate to large hiatal hernia. Electronically Signed   By: Luke Bun M.D.   On: 07/18/2024 23:44     Assessment and Plan:  Atypical chest pain NSTEMI, type 2 Presented with 18/10 severity substernal chest pain radiating to left shoulder with sudden dyspnea and elevated BP reported to be as high as 200 per patient EKG showed atrial fibrillation with chronic LBBB, rate 106 bpm Hs-troponin 15 >> 82, suspect due to demand ischemia in the setting of markedly elevated BP Patient with chronic chest pain of varying severity for the past 10-12 years, similar in character to that of yesterday's chest pain. Given that chest pain and dyspnea has resolved with BiPAP and she remains chest pain-free, no further cardiac workup is indicated at this time. Will continue with conservative measures Antianginals limited by soft BP, can consider low-dose antianginal agent if BP allows Echo pending  Longstanding atrial fibrillation History of multiple cardioversions, most recently was scheduled for DCCV during hospitalization last week but was cancelled due to LAA thrombus noted on TEE. Followed by Dr. Jeffrie, patient declined AV nodal  ablation or pacemaker with preference to continue medical management Continue home meds: amiodarone  200 mg (dose recently increased from 100 mg daily at recent discharge  07/13/24) and Savaysa  30 mg daily  HFrEF Presented with sudden dyspnea in setting of severe chest pain and markedly elevated BP Recent hospitalization last week for ADHF. ProBNP then was 1,459. Of note, proBNP this admission at 1,697 which is within normal limits for patient's age group (abnormal would be >13 for age >76) CXR with cardiomegaly and minimal patchy atelectasis/infiltrate at left base Last echo 07/12/24 showed LVEF 30-35%, global HK, normal RV systolic function, severe BAE, moderate MR, mild AI, normal IVC GDMT has been limited by hypotension Home meds: Lasix  20 mg 3 times a week (previously on Lasix  20 mg daily which was discontinued in July 2025 due to hypotension; current dose was recently started on 07/13/24 discharge) S/p IV Lasix  80 mg x 1 dose in the Ed. Appears euvolemic on exam Echo pending at this time  Per primary Chronic back pain Osteoporosis  Hyperkalemia    Risk Assessment/Risk Scores:       [place]  Heart Association (NYHA) Functional Class NYHA Class II  CHA2DS2-VASc Score = 6   This indicates a 9.7% annual risk of stroke. The patient's score is based upon: CHF History: 1 HTN History: 1 Diabetes History: 0 Stroke History: 0 Vascular Disease History: 1 Age Score: 2 Gender Score: 1      For questions or updates, please contact [facility] Please consult [email or web address] for contact info under      Signed, Owen MARLA Daniels, PA-C  07/19/2024 5:19 PM  ATTENDING ATTESTATION:  After conducting a review of all available clinical information with the care team, interviewing the patient, and performing a physical exam, I agree with the findings and plan described in this note with adjustments as indicated below which were discussed and enacted by staff above.   GEN: No acute distress, AO x 3, frail HEENT:  MMM, no JVD, no scleral icterus Cardiac: Irregular RR, no murmurs, rubs, or gallops.  Respiratory: Poor aeration GI: Soft, nontender,  non-distended  MS: No edema; No deformity. Neuro:  Nonfocal  Vasc:  +2 radial pulses  The patient is a 89 year old female with a history of persistent atrial fibrillation on edoxaban , left bundle branch block, moderate mitral regurgitation, CKD stage II, hypertension, and hyperlipidemia who presents with hypertensive emergency.  The patient was in her normal state of health up until yesterday when she became acutely short of breath.  Her blood pressure was found to be over 200.  On route EMS administered BiPAP.  She is also given Lasix  80 mg IV x 1.  On examination today the patient denies any significant shortness of breath.  She reports a peculiar atypical chest discomfort localized to the center of her chest.  This has been a chronic issue.  The chest pain and dyspnea she presented with has completely resolved.  Her EKG demonstrates atrial fibrillation with a left bundle branch block.  Her troponins are mildly elevated.  Given her advanced age and frailty we have decided to pursue conservative measures.  Will continue with goal-directed medical therapy for her cardiomyopathy and blood pressure control.  Restart edoxaban  for atrial fibrillation.  Farhana Fellows, MD Pager [phone]     [1] No Known Allergies  "

## 2024-07-20 ENCOUNTER — Ambulatory Visit: Admitting: Cardiology

## 2024-07-20 DIAGNOSIS — I5023 Acute on chronic systolic (congestive) heart failure: Secondary | ICD-10-CM

## 2024-07-20 DIAGNOSIS — I10 Essential (primary) hypertension: Secondary | ICD-10-CM | POA: Diagnosis not present

## 2024-07-20 DIAGNOSIS — K449 Diaphragmatic hernia without obstruction or gangrene: Secondary | ICD-10-CM | POA: Diagnosis not present

## 2024-07-20 DIAGNOSIS — E785 Hyperlipidemia, unspecified: Secondary | ICD-10-CM | POA: Diagnosis not present

## 2024-07-20 DIAGNOSIS — I480 Paroxysmal atrial fibrillation: Secondary | ICD-10-CM

## 2024-07-20 DIAGNOSIS — I5033 Acute on chronic diastolic (congestive) heart failure: Secondary | ICD-10-CM | POA: Diagnosis not present

## 2024-07-20 LAB — BASIC METABOLIC PANEL WITH GFR
Anion gap: 8 (ref 5–15)
BUN: 15 mg/dL (ref 8–23)
CO2: 34 mmol/L — ABNORMAL HIGH (ref 22–32)
Calcium: 8.7 mg/dL — ABNORMAL LOW (ref 8.9–10.3)
Chloride: 96 mmol/L — ABNORMAL LOW (ref 98–111)
Creatinine, Ser: 0.83 mg/dL (ref 0.44–1.00)
GFR, Estimated: >60 mL/min
Glucose, Bld: 104 mg/dL — ABNORMAL HIGH (ref 70–99)
Potassium: 3.7 mmol/L (ref 3.5–5.1)
Sodium: 138 mmol/L (ref 135–145)

## 2024-07-20 LAB — CBC
HCT: 40 % (ref 36.0–46.0)
Hemoglobin: 13.2 g/dL (ref 12.0–15.0)
MCH: 30.6 pg (ref 26.0–34.0)
MCHC: 33 g/dL (ref 30.0–36.0)
MCV: 92.6 fL (ref 80.0–100.0)
Platelets: 274 K/uL (ref 150–400)
RBC: 4.32 MIL/uL (ref 3.87–5.11)
RDW: 14.3 % (ref 11.5–15.5)
WBC: 6.5 K/uL (ref 4.0–10.5)
nRBC: 0 % (ref 0.0–0.2)

## 2024-07-20 MED ORDER — SPIRONOLACTONE 12.5 MG HALF TABLET
12.5000 mg | ORAL_TABLET | Freq: Every day | ORAL | Status: DC
  Administered 2024-07-20 – 2024-07-22 (×3): 12.5 mg via ORAL
  Filled 2024-07-20 (×3): qty 1

## 2024-07-20 MED ORDER — PREGABALIN 25 MG PO CAPS: 50.0000 mg | ORAL_CAPSULE | Freq: Three times a day (TID) | ORAL | Status: DC

## 2024-07-20 MED ORDER — ONDANSETRON HCL 4 MG/2ML IJ SOLN: 4.0000 mg | Freq: Four times a day (QID) | INTRAMUSCULAR | Status: DC | PRN

## 2024-07-20 MED ORDER — OXYCODONE HCL 5 MG PO TABS
5.0000 mg | ORAL_TABLET | ORAL | Status: DC | PRN
  Administered 2024-07-21: 5 mg via ORAL
  Filled 2024-07-20: qty 1

## 2024-07-20 MED ORDER — ACETAMINOPHEN 500 MG PO TABS
500.0000 mg | ORAL_TABLET | Freq: Four times a day (QID) | ORAL | Status: DC | PRN
  Administered 2024-07-20: 500 mg via ORAL

## 2024-07-20 MED ORDER — PANTOPRAZOLE SODIUM 40 MG PO TBEC
40.0000 mg | DELAYED_RELEASE_TABLET | Freq: Two times a day (BID) | ORAL | Status: DC
  Administered 2024-07-20 (×2): 40 mg via ORAL
  Filled 2024-07-20 (×2): qty 1

## 2024-07-20 MED ORDER — ACETAMINOPHEN 325 MG PO TABS: 650.0000 mg | ORAL_TABLET | Freq: Four times a day (QID) | ORAL | Status: DC | PRN

## 2024-07-20 MED ORDER — PREGABALIN 75 MG PO CAPS
75.0000 mg | ORAL_CAPSULE | Freq: Every day | ORAL | Status: DC
  Administered 2024-07-20 – 2024-07-22 (×3): 75 mg via ORAL
  Filled 2024-07-20 (×3): qty 1

## 2024-07-20 MED ORDER — PREGABALIN 25 MG PO CAPS
25.0000 mg | ORAL_CAPSULE | ORAL | Status: DC
  Administered 2024-07-20 – 2024-07-21 (×4): 25 mg via ORAL
  Filled 2024-07-20 (×4): qty 1

## 2024-07-20 MED ORDER — AMIODARONE HCL 100 MG PO TABS
100.0000 mg | ORAL_TABLET | Freq: Every day | ORAL | Status: DC
  Administered 2024-07-20 – 2024-07-21 (×2): 100 mg via ORAL
  Filled 2024-07-20 (×2): qty 1

## 2024-07-20 MED ORDER — OXYCODONE HCL ER 10 MG PO T12A
10.0000 mg | EXTENDED_RELEASE_TABLET | Freq: Two times a day (BID) | ORAL | Status: DC
  Administered 2024-07-20 – 2024-07-22 (×4): 10 mg via ORAL
  Filled 2024-07-20 (×4): qty 1

## 2024-07-20 MED ORDER — PREGABALIN 25 MG PO CAPS: 25.0000 mg | ORAL_CAPSULE | Freq: Two times a day (BID) | ORAL | Status: DC

## 2024-07-20 NOTE — Assessment & Plan Note (Signed)
 No antihypertensive agents due to risk of hypotension.  Tolerating well spironolactone 

## 2024-07-20 NOTE — Plan of Care (Signed)
 Patient's family notified that patient takes OxyContin  at home.  I reviewed patient's medications and PDMP website and also patient's clinical status and ordered OxyContin  as taken at home.  Redia Cleaver MD.

## 2024-07-20 NOTE — Assessment & Plan Note (Addendum)
 Continue amiodarone  Continue anticoagulation with edoxaban 

## 2024-07-20 NOTE — Hospital Course (Signed)
 Tammy Walker was admitted to the hospital with the working diagnosis of heart failure exacerbation   89 yo female with the past medical history of atrial fibrillation, hypertension, heart failure, hyperlipidemia and osteoporosis who presented with dyspnea and chest pain.  1/12 to 07/13/24 recent hospitalization for heart failure and atrial fibrillation. Converted to sinus rhythm on amiodarone , and was diuresed with furosemide . Instructed to take furosemide  three times per week.  On current hospitalization she was brought back to the hospital with due acute onset of chest pain, while coming out of the restroom. EMS was called, she was found edematous with blood pressure 200 mmHg systolic and in respiratory distress. She was placed on Cpap, given nitroglycerin  sublingual and was transported to the ED.  On her initial physical examination her blood pressure was 113/78, HR 81, RR 20 and 02 saturation 100%, lungs with no wheezing or rhonchi, heart with S1 and S2 present and regular, abdomen with no distention and lower extremity edema.   Na 135, K 4,7 Cl 96 bicarbonate 27 glucose 166 bun 14 cr 0,90  BNP 1,697 High sensitive troponin 15  Wbc 8,0 hgb 14.1 plt 302   Patient had furosemide  and added spironolactone  . 01/22 improved volume status, possible discharge tomorrow.  01/23 patient with improvement in volume status, will continue diuretic and follow up as outpatient.

## 2024-07-20 NOTE — Evaluation (Signed)
 Physical Therapy Evaluation Patient Details Name: Tammy Walker MRN: [id number] DOB: [birth year] Today's Date: 07/20/2024  History of Present Illness  The pt is a 89 yo female presenting 1/19 with SOB and CP. Work up with elevated troponin. Pt recently admitted 1/12-1/14 for CHF exacerbation. PMH includes: CHF, A-fib, L bundle branch block, HTN, HLD, OP, GERD, and extreme curvature of spine and broken vertebrae.  Clinical Impression  Pt in bed upon arrival of PT, agreeable to evaluation at this time. Prior to admission the pt was ambulating with rollator, reports no falls or issues with mobility since last d/c home. Pt was ambulating 200 ft with supervision during last admission. Pt presents today with decreased activity tolerance, and minor deficits in strength and stability. Pt with HR elevation to 120s with sitting EOB, 140s with standing. Pt reports asymptomatic at this time with all changes in position, RN aware of HR elevation and pt changed to portable heart monitor with CCMD called multiple times in session. Will continue to benefit from skilled PT acutely to progress functional endurance and stability, but given pt ability to stand and ambulate in room without assistance at this time, suspect she will be able to return home with family support once medically cleared.     If plan is discharge home, recommend the following: A little help with walking and/or transfers;A little help with bathing/dressing/bathroom;Assist for transportation;Assistance with cooking/housework   Can travel by private vehicle        Equipment Recommendations None recommended by PT  Recommendations for Other Services       Functional Status Assessment Patient has had a recent decline in their functional status and demonstrates the ability to make significant improvements in function in a reasonable and predictable amount of time.     Precautions / Restrictions Precautions Precautions: Fall Recall of  Precautions/Restrictions: Intact Restrictions Weight Bearing Restrictions Per Provider Order: No      Mobility  Bed Mobility Overal bed mobility: Needs Assistance Bed Mobility: Supine to Sit     Supine to sit: Supervision     General bed mobility comments: increased time, HR elevation to 120s    Transfers Overall transfer level: Needs assistance Equipment used: Rollator (4 wheels) Transfers: Sit to/from Stand Sit to Stand: Supervision           General transfer comment: for STS; can be impulsive, needing cues to wait until equipment in proper place. HR to 140 with stand    Ambulation/Gait Ambulation/Gait assistance: Contact guard assist, +2 safety/equipment Gait Distance (Feet): 12 Feet Assistive device: Rollator (4 wheels) Gait Pattern/deviations: Step-through pattern, Decreased stride length, Decreased dorsiflexion - right, Decreased dorsiflexion - left, Shuffle, Trunk flexed Gait velocity: reduced     General Gait Details: good stability with and without rollator, able to manage without UE support in tight space in room. HR maintained elevated >120bpm which limited further ambulation. Pt denies sx     Balance Overall balance assessment: Needs assistance Sitting-balance support: Feet supported Sitting balance-Leahy Scale: Good Sitting balance - Comments: seated EOB   Standing balance support: No upper extremity supported, Bilateral upper extremity supported Standing balance-Leahy Scale: Fair Standing balance comment: able to stand without UE support but when ambulating without an AD, pt was reaching for railings and counters for improved stability                             Pertinent Vitals/Pain Pain Assessment Pain Assessment: No/denies pain  Home Living Family/patient expects to be discharged to:: Private residence Living Arrangements: Children (daughter) Available Help at Discharge: Family;Available 24 hours/day Type of Home:  House Home Access: Stairs to enter Entrance Stairs-Rails: Doctor, General Practice of Steps: 3   Home Layout: One level Home Equipment: Educational Psychologist (4 wheels);Transport chair      Prior Function Prior Level of Function : Independent/Modified Independent             Mobility Comments: mod I with rollator ADLs Comments: mod I. needs occasional asssitance getting a sweater on her back. is indep with chores. wears glasses occasionally. pt reports back to normal for short time home between admissions     Extremity/Trunk Assessment   Upper Extremity Assessment Upper Extremity Assessment: Generalized weakness    Lower Extremity Assessment Lower Extremity Assessment: Generalized weakness    Cervical / Trunk Assessment Cervical / Trunk Assessment: Kyphotic  Communication   Communication Communication: Impaired Factors Affecting Communication: Hearing impaired (has hearing aides)    Cognition Arousal: Alert Behavior During Therapy: WFL for tasks assessed/performed   PT - Cognitive impairments: No apparent impairments                       PT - Cognition Comments: WFL in session, not formally assessed Following commands: Intact       Cueing Cueing Techniques: Verbal cues, Gestural cues     General Comments General comments (skin integrity, edema, etc.): Pt HR up to 146 and intermittent VTACH reading with transfer to standing and up to 130 at EOB; RN continues to clear pt to mobilize; opted for amb to chair only for pt safety. tele box not working once taken from monitor, so switched to portable with CCMD notified.    Exercises     Assessment/Plan    PT Assessment Patient needs continued PT services  PT Problem List Decreased strength;Decreased activity tolerance;Decreased mobility;Pain       PT Treatment Interventions DME instruction;Gait training;Functional mobility training;Therapeutic activities;Therapeutic exercise;Balance  training;Patient/family education;Manual techniques;Modalities    PT Goals (Current goals can be found in the Care Plan section)  Acute Rehab PT Goals Patient Stated Goal: to return home PT Goal Formulation: With patient Time For Goal Achievement: 08/03/24 Potential to Achieve Goals: Good    Frequency Min 1X/week        AM-PAC PT 6 Clicks Mobility  Outcome Measure Help needed turning from your back to your side while in a flat bed without using bedrails?: None Help needed moving from lying on your back to sitting on the side of a flat bed without using bedrails?: None Help needed moving to and from a bed to a chair (including a wheelchair)?: A Little Help needed standing up from a chair using your arms (e.g., wheelchair or bedside chair)?: A Little Help needed to walk in hospital room?: A Lot Help needed climbing 3-5 steps with a railing? : A Lot 6 Click Score: 18    End of Session Equipment Utilized During Treatment: Gait belt;Oxygen Activity Tolerance: Patient tolerated treatment well Patient left: in bed;with Walker bell/phone within reach;with family/visitor present Nurse Communication: Mobility status PT Visit Diagnosis: Other abnormalities of gait and mobility (R26.89);Muscle weakness (generalized) (M62.81);Pain    Time: 8472-8446 PT Time Calculation (min) (ACUTE ONLY): 26 min   Charges:   PT Evaluation $PT Eval Low Complexity: 1 Low   PT General Charges $$ ACUTE PT VISIT: 1 Visit         Tammy Walker, PT,  DPT   Acute Rehabilitation Department Office [phone] Secure Chat Communication Preferred  Tammy Tammy Walker 07/20/2024, 4:25 PM

## 2024-07-20 NOTE — Consult Note (Signed)
 "                                                  Palliative Care Consult Note                                  Date: 07/20/2024   Patient Name: Tammy Walker  DOB: March 25, 1931  MRN: [id number]  Age / Sex: 89 y.o., female  PCP: Alben Therisa MATSU, [place] Referring Physician: Noralee Elidia Toribio DEWAINE  Reason for Consultation: Establishing goals of care  Past Medical History:  Diagnosis Date   Allergy    Arrhythmia    Atrial fibrillation [facility])    GERD (gastroesophageal reflux disease)    History of bone scan    Bone Density Scans 2017 & 2019 Dr. Malcolm    History of chest x-ray    Apart from large hiatus hernia, normal heart, lungs and mediastinum. Per records from [facility]     History of CT scan of abdomen 10/24/2017   Gallstones within a thin-waled gallbladder, Per records from [facility]    History of echocardiogram    [facility] &  [facility]    History of EKG    Sinus rhythm, borderline 1st deg block, LAD completely changed axis, LBBB(old). Per records from [facility]    Hyperlipidemia    Hypertension    Insomnia    Left lower lobe pneumonia 09/20/2015   Per records from [facility]    Nodule of right lung    Per records from [facility],    Osteoporosis    on fosamax   Osteoporosis    Reduced mobility     Subjective:   This NP Camellia Kays reviewed medical records, received report from team, assessed the patient and then meet at the patient's bedside to discuss diagnosis, prognosis, GOC, EOL wishes disposition and options.  Before meeting with the patient/family, I spent time reviewing the chart notes including admission H&P, cardiology note from yesterday, nurse note from today, cardiology note from today, internal medicine note from today.. I also reviewed vital signs, nursing flowsheets, medication administrations record, labs, and imaging. Labs reviewed include BMP which shows  normal creatinine at 0.83 in the setting of heart failure, elevated at bedtime troponin at 82 which, per cardiology, likely demand ischemia versus overload heart failure, R/L NSTEMI.  proBNP 1697 which is within acceptable range of less than 1800 given her age.  I met with the patient at the bedside, her daughter/HCPOA Dagoberto was present.   We meet to discuss diagnosis prognosis, GOC, EOL wishes, disposition and options. Concept of Palliative Care was introduced as specialized medical care for people and their families living with serious illness.  If focuses on providing relief from the symptoms and stress of a serious illness.  The goal is to improve quality of life for both the patient and the family. Values and goals of care important to patient and family were attempted to be elicited.  Created space and opportunity for patient  and family to explore thoughts and feelings regarding current medical situation   Natural trajectory and current clinical status were discussed. Questions and concerns addressed. Patient  encouraged to call with questions or concerns.    Patient/Family Understanding  of Illness: They understand she came in for chest pain, dyspnea and called EMS who brought her here.  She has chronic back pain.  She notes her heart is weak and only beating at 40%, where normal is 60%.  Daughter states that the plan right now, as explained to them, would be to stay in the hospital for another 1 to 2 days for medication adjustment that she would improve her symptoms with a goal of getting home.  We spent a lot of time talking about pathophysiology of heart failure, anticipated decline over time, and outlook.  Life Review: The patient was married for 49 years but her husband is since passed.  She has 2 daughters and a son and 4 grandchildren and a permanent foster granddaughter.  She is excited about going to [place] for her grandson Jacob's wedding.  Of note, she is proud that she and her  husband walked a marathon in their 72s.  She shares she has a very comfortable life and hide quality of life.  She states I still do things, just on an installment plan.  She still goes to church and enjoys shopping.  She occasionally uses a mobility chair.  Previously had a normal life including travel, talks about her trip to [place] last year.  Patient Values: Desire to live as long as possible with a focus on quality of life.  Baseline Status: At baseline she is functional and has a comfortable and quality life.  She states she is still able to do a lot of things, just takes her time doing so.  She does walk with a rollator.  She currently lives with her daughter Dagoberto.  Her other 2 children, her son and daughter, lives in [place].  Today's Discussion: In addition to discussions described above we have extensive discussion and prolonged discussion on various topics.  We spent a lot of time talking about her life and her definition of quality of life.  She again talks about her enjoyment of shopping, travel.  She went to [place] last year and is planning to go to [place] in July for her grandson Jacob's wedding.  She shares that her life is very comfortable.  She is proud of the fact that she is able to still do things, even if she does do them a little bit slower.  She shares that she wants to stay alive, however when it is her time she is not afraid to die.  We spent a lot of time talking about heart failure and the progressive nature of heart failure.  We shared that her heart failure has progressed and notes that at some point her EF was normal, then it was 45%, now it is 40%.  I shared that it would likely decline in the future, though in no certain timeline.  We talked about medication adjustments and hope that it will allow her to be able to discharge from the hospital and not have to come back frequently.  She does lament being in the hospital just a week ago and having to come  back.  She asked to philosophical question that if she had chest pain and dyspnea like she did, her choice to come back to the hospital versus just stay at home and suffer.  We talked about suffering is being the true enemy and no desire for her to not suffer.  We talked about if her heart failure progresses and gets to the point where she is having to frequently come back  to the hospital and experience a lot of symptoms, a hospice conversation might be more appropriate at that time.  We spent a lot of time talking about the difference between palliative care and hospice. I described hospice as a service for patients who have a life expectancy of 6 months or less. The goal of hospice is the preservation of dignity and quality at the end phases of life. Under hospice care, the focus changes from curative to symptom relief. I explained the three setting where hospice services can be provided including the home, at a living facility (such as LTC SNF, Assisted Living, etc), and a hospice facility. I explained that acceptance to hospice in any specific location is the final decision of the hospice medical director and bed availability, if applicable. They verbalized understanding.  At this time, the patient is not ready for hospice and feels that she still has some quality life left.  Her daughter is very much in support of this.  We agreed for continued medication adjustment, hope for improvement to be able to get out of the hospital.  She hopes to avoid coming back and forth to the hospital frequently, although notes that at some point her heart failure will continue to deteriorate.  She is agreeable to outpatient palliative care for ongoing evaluation and goals of care conversations over time.  We talked about hospice will be an appropriate conversation, at some point, and outpatient palliative care can assist with this as well.  Finally, we had a conversation on CODE STATUS.  We talked about statistical  outcomes in patients in similar situations and the role of cardiopulmonary resuscitation and overall care, especially in patients with multiple chronic illnesses.  After thorough exploration of her goals and wishes, the patient has decided to transition to DNR-limited (DNR/DNI) excepting that cardiopulmonary resuscitation is not within her goals of care with her chronic illnesses.  I shared that I would come back in a couple days to check on her if she is still admitted.  I provided my contact card to her daughter and encouraged her to call for any questions or concerns while her mother remains admitted. I provided emotional and general support through therapeutic listening, empathy, sharing of stories, and other techniques. I answered all questions and addressed all concerns to the best of my ability.  After seeing the patient, her daughter Dagoberto called later in the day.  We spent time talk about options for palliative care and I encouraged her to consider choices.  At this time she requests referral to hospice of the [facility] palliative program care connections.  I shared that I would make the referral and follow-up with them on Friday as previously planned.  Goals: To stay alive as long as her quality of life is good.  Review of Systems  Respiratory:  Negative for shortness of breath.   Cardiovascular:  Negative for chest pain.  Gastrointestinal:  Negative for abdominal pain, nausea and vomiting.    Objective:   Primary Diagnoses: Present on Admission:  Essential hypertension  Hyperlipidemia  Paroxysmal atrial fibrillation (HCC)   Vital Signs:  BP 90/63 (BP Location: Left Arm)   Pulse 73   Temp (!) 97.3 F (36.3 C) (Axillary)   Resp 20   Ht 4' 11 (1.499 m)   Wt 49.3 kg   SpO2 94%   BMI 21.95 kg/m   Physical Exam Constitutional:      General: She is not in acute distress.    Appearance: She is  ill-appearing.     Comments: Overall feels better  HENT:     Head:  Normocephalic and atraumatic.  Cardiovascular:     Rate and Rhythm: Normal rate.  Pulmonary:     Effort: Pulmonary effort is normal. No respiratory distress.  Abdominal:     General: Abdomen is flat. There is no distension.     Palpations: Abdomen is soft.  Skin:    General: Skin is warm and dry.  Neurological:     General: No focal deficit present.     Mental Status: She is alert and oriented to person, place, and time.  Psychiatric:        Mood and Affect: Mood normal.        Behavior: Behavior normal.     Palliative Assessment/Data: 60%   Assessment & Plan:   HPI/Patient Profile: 89 y.o. female  with past medical history of atrial fibrillation, hypertension, hyperlipidemia and osteoporosis who presented with dyspnea and chest pain.  She presented to the emergency department via EMS for acute chest pain and dyspnea while exiting the bathroom at home.  After ED workup she was admitted on 07/18/2024 with acute on chronic diastolic heart failure, acute hypoxic respiratory failure due to acute cardiogenic pulmonary edema, at bedtime troponin elevation likely volume overload versus demand ischemia, paroxysmal A-fib, and others.   Palliative medicine was consulted for goals of care conversations.  SUMMARY OF RECOMMENDATIONS   Changed to DNR/DNI (DNR-limited) Continue medication adjustments with goal of discharging home Agreeable to outpatient palliative care for ongoing GOC conversations Wants to continue to live as long as her quality of life is good She understands at some point in the future hospice will be an appropriate discussion, is not ready for it yet Palliative medicine will follow-up in 2 days if she remains admitted for ongoing conversations and support  Symptom Management:  Per primary team Palliative medicine is available to assist as needed  Code Status: DNR - Limited (DNR/DNI)  Prognosis:  Unable to determine  Discharge Planning:  To Be Determined    Discussed with: Patient, family, medical team, nursing team, [facility] team, outpatient palliative liaison    Thank you for allowing us  to participate in the care of RASHA IBE PMT will continue to support holistically.  Time Total: 90 min  Detailed review of medical records (labs, imaging, vital signs), medically appropriate exam, discussed with treatment team, counseling and education to patient, family, & staff, documenting clinical information, medication management, coordination of care  Signed by: Camellia Kays, NP Palliative Medicine Team  Team Phone # [phone] (Nights/Weekends)  07/20/2024, 3:29 PM  "

## 2024-07-20 NOTE — Plan of Care (Signed)
" °  Problem: Education: Goal: Knowledge of General Education information will improve Description: Including pain rating scale, medication(s)/side effects and non-pharmacologic comfort measures Outcome: Progressing   Problem: Clinical Measurements: Goal: Ability to maintain clinical measurements within normal limits will improve Outcome: Progressing   Problem: Safety: Goal: Ability to remain free from injury will improve Outcome: Progressing   Problem: Skin Integrity: Goal: Risk for impaired skin integrity will decrease Outcome: Progressing   Problem: Pain Managment: Goal: General experience of comfort will improve and/or be controlled Outcome: Progressing   "

## 2024-07-20 NOTE — Progress Notes (Signed)
" °  Progress Note   Patient: Tammy Walker FMW:[id number] DOB: 1930/08/27 DOA: 07/18/2024     1 DOS: the patient was seen and examined on 07/20/2024   Brief hospital course: Tammy Walker was admitted to the hospital with the working diagnosis of NSTEMI.  89 yo female with the past medical history of atrial fibrillation, hypertension, hyperlipidemia and osteoporosis who presented with dyspnea and chest pain.  1/12 to 07/13/24 recent hospitalization for heart failure and atrial fibrillation. Converted to sinus rhythm on amiodarone , and was diuresed with furosemide . Instructed to take furosemide  three times per week.  On current hospitalization she was brought back to the hospital with due acute onset of chest pain, while coming out of the restroom. EMS was called, she was found edematous with blood pressure 200 mmHg systolic and in respiratory distress. She was placed on Cpap, given nitroglycerin  sublingual and was transported to the ED.  On her initial physical examination her blood pressure was 113/78, HR 81, RR 20 and 02 saturation 100%, lungs with no wheezing or rhonchi, heart with S1 and S2 present and regular, abdomen with no distention and lower extremity edema.     Assessment and Plan: * Acute on chronic diastolic CHF (congestive heart failure) (HCC) Echocardiogram with mild reduction in systolic function EF 40 to 45%, global hypokinesis, RV systolic function preserved, LA with severe dilatation, mild to moderate mitral valve regurgitation, no pericardial effusion   Improved volume status.  Fluid balance negative 1800 ml  Holding furosemide .  Patient will benefit from RAAS inhibition, will start low dose spironolactone , and possible addition of SGLT 2 inh   Acute hypoxemic respiratory failure due to acute cardiogenic pulmonary edema.  Oxygenation has improved, 02 saturation today 100% on 2 L/min per [place]   High sensitive troponin elevation due to likely volume overloaded heart failure, vs  demand ischemia, to rule out NSTEMI.  Considering patient's condition and advanced directives continue conservative therapy   Essential hypertension Continue blood pressure monitoring No antihypertensive agents due to risk of hypotension.   Paroxysmal atrial fibrillation (HCC) Continue amiodarone  Continue anticoagulation with edoxaban   Hyperlipidemia Continue statin   Hiatal hernia Continue pantoprazole        Subjective: Patient with no chest pain, her dyspnea has improved, continue very weak and deconditioned   Physical Exam: Vitals:   07/19/24 2344 07/20/24 0426 07/20/24 0821 07/20/24 1100  BP: 110/64 94/66 (!) 126/94 90/63  Pulse: 92 67 (!) 102 73  Resp: 18 20 20 20   Temp: 98.2 F (36.8 C) 98.2 F (36.8 C) 98 F (36.7 C) (!) 97.3 F (36.3 C)  TempSrc: Oral Oral Axillary Axillary  SpO2: 93% 98% 94% 94%  Weight:      Height:       Neurology awake and alert, deconditioned ENT with mild pallor with no icterus Cardiovascular with S1 and S2 present and regular with no gallops or rubs. Positive systolic murmur at the apex No JVD Respiratory with mild rales at bases with no wheezing Abdomen with no distention  No lower extremity edema   Data Reviewed:    Family Communication: I spoke with patient's daughter at the bedside, we talked in detail about patient's condition, plan of care and prognosis and all questions were addressed.   Disposition: Status is: Inpatient Remains inpatient appropriate because: recovering heart failure   Planned Discharge Destination: Home    Author: Elidia Toribio [place], MD 07/20/2024 12:24 PM  For on call review [email or web address].  "

## 2024-07-20 NOTE — Assessment & Plan Note (Signed)
 Continue statin

## 2024-07-20 NOTE — Assessment & Plan Note (Addendum)
 Echocardiogram with mild reduction in systolic function EF 40 to 45%, global hypokinesis, RV systolic function preserved, LA with severe dilatation, mild to moderate mitral valve regurgitation, no pericardial effusion   Patient was placed on IV furosemide  for diuresis, negative fluid balance was achieved with improvement in her symptoms.   Plan to continue spironolactone  for RAAS inhibition and loop diuretic to prevent volume overload.  Possible addition of SGLT 2 inh as outpatient.  Limited medical therapy due to risk of hypotension and frailty   Acute hypoxemic respiratory failure due to acute cardiogenic pulmonary edema.  Oxygenation has improved, 02 saturation today 97% on room air.    High sensitive troponin elevation due to likely volume overloaded heart failure, vs demand ischemia, NSTEMI has been ruled.   Considering patient's condition and advanced directives continue conservative therapy

## 2024-07-20 NOTE — Evaluation (Signed)
 Occupational Therapy Evaluation Patient Details Name: Tammy Walker MRN: [id number] DOB: Jun 10, 1931 Today's Date: 07/20/2024   History of Present Illness   The pt is a 89 yo female presenting 1/19 with SOB and CP. Work up with elevated troponin. Pt recently admitted 1/12-1/14 for CHF exacerbation. PMH includes: CHF, A-fib, L bundle branch block, HTN, HLD, OP, GERD, and extreme curvature of spine and broken vertebrae.     Clinical Impressions Pt presents with problem above with deficits mentioned below. Upon eval, pt eager to get OOB needing cues to slow down for therapist to ensure continuous monitoring of HR. Evaluation limited by readings of up to 146 bpm with STS transfer and intermittent VTACH reading. Portable monitor with faulty battery, so switched to portable tele box with RN continuing to clear pt for mobility despite HR readings. Opted for ambulation around bed to chair only for pt safety on her request to sit in chair. Pt performing with CGA-supervision with rollator on 2 L supplemental O2. Acute OT to follow, but do not suspect need for continued OT after discharge.      If plan is discharge home, recommend the following:   Assistance with cooking/housework;Assist for transportation;Help with stairs or ramp for entrance;A little help with bathing/dressing/bathroom     Functional Status Assessment   Patient has had a recent decline in their functional status and demonstrates the ability to make significant improvements in function in a reasonable and predictable amount of time.     Equipment Recommendations   None recommended by OT     Recommendations for Other Services         Precautions/Restrictions   Precautions Precautions: Fall Recall of Precautions/Restrictions: Intact Restrictions Weight Bearing Restrictions Per Provider Order: No     Mobility Bed Mobility Overal bed mobility: Needs Assistance Bed Mobility: Supine to Sit     Supine to sit:  Supervision     General bed mobility comments: increased time    Transfers Overall transfer level: Needs assistance Equipment used: Rollator (4 wheels) Transfers: Sit to/from Stand Sit to Stand: Supervision           General transfer comment: for STS; can be impulsive, needing cues to wait until equipment in proper place      Balance                                           ADL either performed or assessed with clinical judgement   ADL Overall ADL's : Needs assistance/impaired Eating/Feeding: Independent;Sitting   Grooming: Set up;Sitting   Upper Body Bathing: Set up;Sitting   Lower Body Bathing: Contact guard assist;Sit to/from stand   Upper Body Dressing : Set up;Sitting   Lower Body Dressing: Sitting/lateral leans;Supervision/safety Lower Body Dressing Details (indicate cue type and reason): to don shoes with back Toilet Transfer: Cueing for safety;Cueing for sequencing;Rolling walker (2 wheels);Supervision/safety           Functional mobility during ADLs: Contact guard assist;Rollator (4 wheels);Cueing for safety       Vision Baseline Vision/History: 1 Wears glasses Ability to See in Adequate Light: 0 Adequate Patient Visual Report: No change from baseline Vision Assessment?: Wears glasses for reading;Wears glasses for driving     Perception Perception: Within Functional Limits       Praxis Praxis: [facility]       Pertinent Vitals/Pain Pain Assessment Pain Assessment: No/denies pain  Extremity/Trunk Assessment Upper Extremity Assessment Upper Extremity Assessment: Generalized weakness   Lower Extremity Assessment Lower Extremity Assessment: Defer to PT evaluation   Cervical / Trunk Assessment Cervical / Trunk Assessment: Kyphotic   Communication Communication Communication: Impaired Factors Affecting Communication: Hearing impaired (has hearing aides)   Cognition Arousal: Alert Behavior During Therapy: WFL for tasks  assessed/performed Cognition: No apparent impairments             OT - Cognition Comments: WFL given age, suspect some decr STM                 Following commands: Intact       Cueing  General Comments   Cueing Techniques: Verbal cues;Gestural cues  Pt HR up to 146 and intermittent VTACH reading with transfer to standing and up to 130 at EOB; RN continues to clear pt to mobilize; opted for amb to chair only for pt safety. tele box not working once taken from monitor, so switched to portable with CCMD notified.   Exercises     Shoulder Instructions      Home Living Family/patient expects to be discharged to:: Private residence Living Arrangements: Children (daughter) Available Help at Discharge: Family;Available 24 hours/day Type of Home: House Home Access: Stairs to enter [employer] of Steps: 3 Entrance Stairs-Rails: Right;Left Home Layout: One level     Bathroom Shower/Tub: Chief Strategy Officer: Standard Bathroom Accessibility: No   Home Equipment: Educational Psychologist (4 wheels);Transport chair          Prior Functioning/Environment Prior Level of Function : Independent/Modified Independent             Mobility Comments: mod I with rollator ADLs Comments: mod I. needs occasional asssitance getting a sweater on her back. is indep with chores. wears glasses occasionally. pt reports back to normal for short time home between admissions    OT Problem List: Decreased strength;Decreased activity tolerance;Impaired balance (sitting and/or standing);Decreased safety awareness;Decreased knowledge of use of DME or AE   OT Treatment/Interventions: Self-care/ADL training;Therapeutic exercise;DME and/or AE instruction;Therapeutic activities;Patient/family education;Balance training      OT Goals(Current goals can be found in the care plan section)   Acute Rehab OT Goals OT Goal Formulation: With patient Time For Goal  Achievement: 08/03/24 Potential to Achieve Goals: Fair   OT Frequency:  Min 2X/week    Co-evaluation              AM-PAC OT 6 Clicks Daily Activity     Outcome Measure Help from another person eating meals?: None Help from another person taking care of personal grooming?: A Little Help from another person toileting, which includes using toliet, bedpan, or urinal?: A Little Help from another person bathing (including washing, rinsing, drying)?: A Little Help from another person to put on and taking off regular upper body clothing?: A Little Help from another person to put on and taking off regular lower body clothing?: A Little 6 Click Score: 19   End of Session Equipment Utilized During Treatment: Rollator (4 wheels) Nurse Communication: Mobility status;Other (comment) (present for most of session to switch to portable tele)  Activity Tolerance: Patient tolerated treatment well Patient left: in chair;with call bell/phone within reach;with chair alarm set  OT Visit Diagnosis: Unsteadiness on feet (R26.81);Other abnormalities of gait and mobility (R26.89);Muscle weakness (generalized) (M62.81)                Time: 8472-8449 OT Time Calculation (min): 23 min Charges:  OT General  Charges $OT Visit: 1 Visit OT Evaluation $OT Eval Low Complexity: 1 Low  Elma JONETTA Lebron FREDERICK, OTR/L [facility] Acute Rehabilitation Office: [phone]   Elma JONETTA Lebron 07/20/2024, 4:06 PM

## 2024-07-20 NOTE — TOC Progression Note (Signed)
 Transition of Care [facility]) - Progression Note    Patient Details  Name: Tammy Walker MRN: [id number] Date of Birth: Jun 21, 1931  Transition of Care [facility]) CM/SW Contact  Luise JAYSON Pan, [place] Phone Number: 07/20/2024, 4:34 PM  Clinical Narrative:   Per Palliative, family would like [facility] to follow patient for services. Per Palliative, they offered choice. CSW initiated referral.  ICM will continue to follow.                      Expected Discharge Plan and Services                                               Social Drivers of Health (SDOH) Interventions SDOH Screenings   Food Insecurity: No Food Insecurity (07/19/2024)  Housing: Low Risk (07/19/2024)  Transportation Needs: No Transportation Needs (07/19/2024)  Utilities: Not At Risk (07/19/2024)  Depression (PHQ2-9): Low Risk (04/14/2024)  Social Connections: Moderately Integrated (07/19/2024)  Tobacco Use: Low Risk (07/19/2024)    Readmission Risk Interventions    07/12/2024    4:40 PM  Readmission Risk Prevention Plan  Post Dischage Appt Complete  Medication Screening Complete  Transportation Screening Complete

## 2024-07-20 NOTE — Progress Notes (Signed)
 Patient ID: Tammy Walker, female   DOB: [birth year], 89 y.o.   MRN: [id number]    Progress Note  Patient Name: Tammy Walker Date of Encounter: 07/20/2024 [facility] Cardiologist: Oneil Parchment, MD   Interval Summary   No acute events overnight.  Developed some dyspnea overnight.  Vital Signs Vitals:   07/19/24 2021 07/19/24 2024 07/19/24 2344 07/20/24 0426  BP:  (!) 97/58 110/64 94/66  Pulse:  91 92 67  Resp:  14 18 20   Temp:  98 F (36.7 C) 98.2 F (36.8 C) 98.2 F (36.8 C)  TempSrc:  Oral Oral Oral  SpO2:   93% 98%  Weight: 49.3 kg     Height: 4' 11 (1.499 m)       Intake/Output Summary (Last 24 hours) at 07/20/2024 0734 Last data filed at 07/20/2024 0428 Gross per 24 hour  Intake --  Output 800 ml  Net -800 ml      07/19/2024    8:21 PM 07/18/2024   10:56 PM 07/13/2024    3:42 AM  Last 3 Weights  Weight (lbs) 108 lb 11 oz 116 lb 8 oz 116 lb 8 oz  Weight (kg) 49.3 kg 52.844 kg 52.844 kg      Telemetry/ECG  AF LBBB - Personally Reviewed  Physical Exam  GEN: No acute distress.  Frail Neck: JVP ~8 Cardiac: Irregular RR, soft systolic murmur apex Respiratory: Poor aeration GI: Soft, nontender, non-distended  MS: No edema  Assessment & Plan  Hypertensive urgency/acute systolic HF BP low now D/C lasix  for today given low BP Plan on restarting low dose PO tomorrow Limited TTE 1/20 EF 45%, mild to moderate MR Atrial fibrillation Failed Multaq  CV 10/29/22 CV 12/01/22  07/11/24 HF/AF hospitalization TEE 07/12/24 with small thrombus CV deferred D/C'd 1/14 on amiodarone  200, edoxaban  30, lasix  20mg  TID Readmitted 1/19 Edoxaban  30mg  qday Decrease amiodarone  to 100mg  given low BP; will give at bedtime Hyperlipidemia Continue atorvastatin  20mg  LBBB Disposition Had a conversation with patient and daughter at bedside.  Given advanced age, frailty and now multiple hospitalizations, I brought up a palliative care consult to evaluate goals of care, code  status, and perhaps hospice.  Daughter would like to pursue palliative care consult.   For questions or updates, please contact [facility] Please consult [email or web address] for contact info under         Signed, Shirelle Tootle K Jamielynn Wigley, MD

## 2024-07-20 NOTE — Assessment & Plan Note (Signed)
-   Continue pantoprazole

## 2024-07-21 DIAGNOSIS — I5033 Acute on chronic diastolic (congestive) heart failure: Secondary | ICD-10-CM

## 2024-07-21 DIAGNOSIS — K449 Diaphragmatic hernia without obstruction or gangrene: Secondary | ICD-10-CM | POA: Diagnosis not present

## 2024-07-21 DIAGNOSIS — I480 Paroxysmal atrial fibrillation: Secondary | ICD-10-CM | POA: Diagnosis not present

## 2024-07-21 DIAGNOSIS — I10 Essential (primary) hypertension: Secondary | ICD-10-CM | POA: Diagnosis not present

## 2024-07-21 DIAGNOSIS — E43 Unspecified severe protein-calorie malnutrition: Secondary | ICD-10-CM

## 2024-07-21 DIAGNOSIS — E785 Hyperlipidemia, unspecified: Secondary | ICD-10-CM | POA: Diagnosis not present

## 2024-07-21 LAB — MAGNESIUM: Magnesium: 2.4 mg/dL (ref 1.7–2.4)

## 2024-07-21 LAB — BASIC METABOLIC PANEL WITH GFR
Anion gap: 9 (ref 5–15)
BUN: 11 mg/dL (ref 8–23)
CO2: 29 mmol/L (ref 22–32)
Calcium: 8.3 mg/dL — ABNORMAL LOW (ref 8.9–10.3)
Chloride: 95 mmol/L — ABNORMAL LOW (ref 98–111)
Creatinine, Ser: 0.63 mg/dL (ref 0.44–1.00)
GFR, Estimated: >60 mL/min
Glucose, Bld: 95 mg/dL (ref 70–99)
Potassium: 3.7 mmol/L (ref 3.5–5.1)
Sodium: 133 mmol/L — ABNORMAL LOW (ref 135–145)

## 2024-07-21 MED ORDER — PANTOPRAZOLE SODIUM 20 MG PO TBEC
20.0000 mg | DELAYED_RELEASE_TABLET | Freq: Every day | ORAL | Status: DC
  Administered 2024-07-21 – 2024-07-22 (×2): 20 mg via ORAL
  Filled 2024-07-21 (×2): qty 1

## 2024-07-21 MED ORDER — FUROSEMIDE 20 MG PO TABS
10.0000 mg | ORAL_TABLET | Freq: Every day | ORAL | Status: DC
  Administered 2024-07-21 – 2024-07-22 (×2): 10 mg via ORAL
  Filled 2024-07-21 (×2): qty 1

## 2024-07-21 NOTE — Progress Notes (Signed)
 Patient ID: Tammy Walker, female   DOB: [birth year], 89 y.o.   MRN: [id number]    Progress Note  Patient Name: Tammy Walker Date of Encounter: 07/21/2024 [facility] Cardiologist: Oneil Parchment, MD   Interval Summary   No acute events overnight.  Slept well  Vital Signs Vitals:   07/20/24 1900 07/20/24 2300 07/21/24 0100 07/21/24 0333  BP: (!) 129/90 105/75  (!) 108/57  Pulse: (!) 126 75  61  Resp: 17 20 17 17   Temp: (!) 97.4 F (36.3 C) 98.1 F (36.7 C)  97.7 F (36.5 C)  TempSrc: Oral Oral  Oral  SpO2: 99% 99% 99% 96%  Weight:      Height:        Intake/Output Summary (Last 24 hours) at 07/21/2024 0807 Last data filed at 07/20/2024 2300 Gross per 24 hour  Intake 660 ml  Output 150 ml  Net 510 ml      07/19/2024    8:21 PM 07/18/2024   10:56 PM 07/13/2024    3:42 AM  Last 3 Weights  Weight (lbs) 108 lb 11 oz 116 lb 8 oz 116 lb 8 oz  Weight (kg) 49.3 kg 52.844 kg 52.844 kg      Telemetry/ECG  AF LBBB - Personally Reviewed  Physical Exam  GEN: No acute distress.  Frail Neck: JVP flat Cardiac: Irregular RR, soft systolic murmur apex Respiratory: Poor aeration, no rales GI: Soft, nontender, non-distended  MS: No edema  Assessment & Plan  Hypertensive urgency/acute systolic HF Volume up per I/O >>start lasix  10mg  Qday Continue spironolactone  12.5mg  Limited TTE 1/20 EF 45%, mild to moderate MR Atrial fibrillation Failed Multaq  CV 10/29/22 CV 12/01/22 07/11/24 HF/AF hospitalization TEE 07/12/24 with small thrombus CV deferred D/C'd 1/14 on amiodarone  200, edoxaban  30, lasix  20mg  TID Readmitted 1/19 Edoxaban  30mg  qday Continue amiodarone  100mg  at bedtime Hyperlipidemia Continue atorvastatin  20mg  LBBB Palliative care Consult yesterday >> DNR DNI Disposition Hopefully can be discharged tomorrow.   For questions or updates, please contact [facility] Please consult [email or web address] for contact info under         Signed, Jaid Quirion K  Mahonri Seiden, MD

## 2024-07-21 NOTE — Progress Notes (Signed)
 This chaplain responded to PMT NP-Eric referral for spiritual care. The Pt. is awake and eating lunch. The Pt. daughter/caregiver-Judy is at the bedside visiting.   The chaplain introduced herself and connected with the Pt. through her creative expressions of art: knitting, crocheting, and applique. The chaplain learned the Pt. ability to complete art and share with people is part of the Pt. quality of life.   The Pt. described the importance of meaningful friend groups and staying connected with the world through the news.  The Pt. hesitantly accepted F/U spiritual care. However,  the Pt. daughter accepted all supportive resources through spiritual care and/or social work in the setting of caregiver burden.  This chaplain is available for F/U spiritual care as needed.  Chaplain Leeroy Hummer [phone]

## 2024-07-21 NOTE — Plan of Care (Signed)
  Problem: Clinical Measurements: Goal: Ability to maintain clinical measurements within normal limits will improve Outcome: Progressing   Problem: Coping: Goal: Level of anxiety will decrease Outcome: Progressing   Problem: Safety: Goal: Ability to remain free from injury will improve Outcome: Progressing   

## 2024-07-21 NOTE — Assessment & Plan Note (Signed)
Continue nutritional supplements. °

## 2024-07-21 NOTE — Progress Notes (Signed)
 Mobility Specialist Progress Note:    07/21/24 1420  Mobility  Activity Turned to back - supine (Ankle pumps, leg lifts, heel slides)  Level of Assistance Standby assist, set-up cues, supervision of patient - no hands on  Assistive Device None  Range of Motion/Exercises Active  Activity Response Tolerated well  Mobility Referral Yes  Mobility visit 1 Mobility  Mobility Specialist Start Time (ACUTE ONLY) 1420  Mobility Specialist Stop Time (ACUTE ONLY) 1428  Mobility Specialist Time Calculation (min) (ACUTE ONLY) 8 min   Received pt sitting up in bed agreeable to bed mobility. No c/o any symptoms. Pt moving well stating that she practices her PT exercises daily. Very motivated to stay strong and mobile. Left pt in bed w/ all needs met.   Venetia Keel Mobility Specialist Please Neurosurgeon or Rehab Office at [phone]

## 2024-07-21 NOTE — Progress Notes (Addendum)
" °  Progress Note   Patient: Tammy Walker FMW:[id number] DOB: 1931-06-18 DOA: 07/18/2024     2 DOS: the patient was seen and examined on 07/21/2024   Brief hospital course: Mrs. Bambach was admitted to the hospital with the working diagnosis of NSTEMI.  89 yo female with the past medical history of atrial fibrillation, hypertension, hyperlipidemia and osteoporosis who presented with dyspnea and chest pain.  1/12 to 07/13/24 recent hospitalization for heart failure and atrial fibrillation. Converted to sinus rhythm on amiodarone , and was diuresed with furosemide . Instructed to take furosemide  three times per week.  On current hospitalization she was brought back to the hospital with due acute onset of chest pain, while coming out of the restroom. EMS was called, she was found edematous with blood pressure 200 mmHg systolic and in respiratory distress. She was placed on Cpap, given nitroglycerin  sublingual and was transported to the ED.  On her initial physical examination her blood pressure was 113/78, HR 81, RR 20 and 02 saturation 100%, lungs with no wheezing or rhonchi, heart with S1 and S2 present and regular, abdomen with no distention and lower extremity edema.   Patient had furosemide  and added spironolactone  . 01/22 improved volume status, possible discharge tomorrow.   Assessment and Plan: * Acute on chronic diastolic CHF (congestive heart failure) (HCC) Echocardiogram with mild reduction in systolic function EF 40 to 45%, global hypokinesis, RV systolic function preserved, LA with severe dilatation, mild to moderate mitral valve regurgitation, no pericardial effusion   Improved volume status.  Continue spironolactone , and possible addition of SGLT 2 inh as outpatient  Loop diuretic with furosemide  10 mg po daily.   Acute hypoxemic respiratory failure due to acute cardiogenic pulmonary edema.  Oxygenation has improved, 02 saturation today 97% on room air.    High sensitive troponin  elevation due to likely volume overloaded heart failure, vs demand ischemia, to rule out NSTEMI.  Considering patient's condition and advanced directives continue conservative therapy   Essential hypertension Continue blood pressure monitoring No antihypertensive agents due to risk of hypotension.   Paroxysmal atrial fibrillation (HCC) Continue amiodarone  Continue anticoagulation with edoxaban   Hyperlipidemia Continue statin   Hiatal hernia Continue pantoprazole    Protein-calorie malnutrition, severe Continue nutritional supplements         Subjective: Patient with no chest pain and no dyspnea, no lower extremity edema, PND or orthopnea   Physical Exam: Vitals:   07/20/24 2300 07/21/24 0100 07/21/24 0333 07/21/24 1212  BP: 105/75  (!) 108/57 116/74  Pulse: 75  61 78  Resp: 20 17 17  (!) 26  Temp: 98.1 F (36.7 C)  97.7 F (36.5 C) 98.4 F (36.9 C)  TempSrc: Oral  Oral Oral  SpO2: 99% 99% 96% 100%  Weight:      Height:       Neurology awake and alert ENT with mild pallor with no icterus Cardiovascular with S1 and S2 present and regular with no gallops or rubs. Systolic murmur at the apex  Respiratory with no rales or wheezing, no rhonchi, positive kyphosis  Abdomen with no distention  No lower extremity edema   Data Reviewed:    Family Communication: no family at the bedside   Disposition: Status is: Inpatient Remains inpatient appropriate because: recovering heart failure  Planned Discharge Destination: Home     Author: Elidia Toribio Furnace, MD 07/21/2024 3:08 PM  For on call review [email or web address].  "

## 2024-07-21 NOTE — Progress Notes (Signed)
 Initial Nutrition Assessment  DOCUMENTATION CODES:   Severe malnutrition in context of social or environmental circumstances  INTERVENTION:   Magic cup BID with meals, each supplement provides 290 kcal and 9 grams of protein   Liberalized diet from Heart Healthy to Regular   Discussed with patient and family importance of adequate oral intake   NUTRITION DIAGNOSIS:   Severe Malnutrition related to social / environmental circumstances (Prolonged inadequate oral intake.) as evidenced by severe muscle depletion, severe fat depletion.  GOAL:   Patient will meet greater than or equal to 90% of their needs   MONITOR:   PO intake, Supplement acceptance, Weight trends, Labs  REASON FOR ASSESSMENT:   Consult Assessment of nutrition requirement/status  ASSESSMENT:   PMH: A-fib, CHF, HTN, osteoporosis, GERD Admitted with: acute on chronic diastolic CHF Presented with: respiratory distress  Pt remains admitted for ongoing management for heart failure. Per cardiology volume appears increased started on 10mg  Lasix  daily. Possible d/c tomorrow if stable. PMT following for GOC pt and family desire for medical optimization.    Pt and daughter reported nutrition hx. At home pt eats 5 small meals a day. She cannot tolerate a large amount at a time do to hiatal hernia. Pt is eats a consistent amount, just smaller amounts due to typical aging behaviors. On a normal day of eating she would drinks plain coffee, white toast and fried tomato, chicken pot pie, a small pizza, a small piece of meat with veggies and a starch. She would have a variety of meats such as fish, pork, or baked chicken. She would end the day with a small decaf coffee with a vanilla protein shake mixed in. On a fluid restriction and exploring ways to prevent dry mouth. Pt expressed not liking Ensures, willing to try Magic Cup.   Pt reported no problems chewing, some problems swallowing due to dry mouth and hernia.   Pt reports  wt has been stable over the past several months. Muscle loss do to prolonged inadequate intake   Admit weight: 49.3 Current weight: 49.3   Average Meal Intake 1/21: 75% intake x 1 recorded meals  Nutritionally Relevant Medications: Scheduled Meds:  atorvastatin   20 mg Oral Daily   multivitamin  1 tablet Oral Q breakfast   pantoprazole   40 mg Oral BID   spironolactone   12.5 mg Oral Daily   PRN Meds:ondansetron  (ZOFRAN ) IV  Labs Reviewed: Na: 133 Ca: 8.3   NUTRITION - FOCUSED PHYSICAL EXAM:  Flowsheet Row Most Recent Value  Orbital Region Mild depletion  Upper Arm Region Severe depletion  Thoracic and Lumbar Region Severe depletion  Buccal Region Mild depletion  Temple Region Severe depletion  Clavicle Bone Region Severe depletion  Clavicle and Acromion Bone Region Severe depletion  Scapular Bone Region Unable to assess  Dorsal Hand Severe depletion  Patellar Region Severe depletion  Anterior Thigh Region Severe depletion  Posterior Calf Region Severe depletion  Edema (RD Assessment) None  Hair Reviewed  Eyes Reviewed  Mouth Reviewed  Skin Reviewed  Nails Reviewed    Diet Order:   Diet Order             Diet regular Room service appropriate? Yes with Assist; Fluid consistency: Thin; Fluid restriction: 1200 mL Fluid  Diet effective now                   EDUCATION NEEDS:   Education needs have been addressed  Skin:  Skin Assessment: Reviewed RN Assessment  Last BM:  1/18  Height:   Ht Readings from Last 1 Encounters:  07/19/24 4' 11 (1.499 m)    Weight:   Wt Readings from Last 1 Encounters:  07/19/24 49.3 kg    BMI:  Body mass index is 21.95 kg/m.  Estimated Nutritional Needs:   Kcal:  1,200-1,400  Protein:  60-75 g  Fluid:  1.2L    Con Friends, Dietetic Intern

## 2024-07-22 ENCOUNTER — Other Ambulatory Visit (HOSPITAL_COMMUNITY): Payer: Self-pay

## 2024-07-22 DIAGNOSIS — E43 Unspecified severe protein-calorie malnutrition: Secondary | ICD-10-CM | POA: Diagnosis not present

## 2024-07-22 DIAGNOSIS — K449 Diaphragmatic hernia without obstruction or gangrene: Secondary | ICD-10-CM | POA: Diagnosis not present

## 2024-07-22 DIAGNOSIS — I5033 Acute on chronic diastolic (congestive) heart failure: Secondary | ICD-10-CM | POA: Diagnosis not present

## 2024-07-22 DIAGNOSIS — I480 Paroxysmal atrial fibrillation: Secondary | ICD-10-CM | POA: Diagnosis not present

## 2024-07-22 DIAGNOSIS — E785 Hyperlipidemia, unspecified: Secondary | ICD-10-CM | POA: Diagnosis not present

## 2024-07-22 DIAGNOSIS — I10 Essential (primary) hypertension: Secondary | ICD-10-CM | POA: Diagnosis not present

## 2024-07-22 LAB — RENAL FUNCTION PANEL
Albumin: 3.7 g/dL (ref 3.5–5.0)
Anion gap: 5 (ref 5–15)
BUN: 13 mg/dL (ref 8–23)
CO2: 34 mmol/L — ABNORMAL HIGH (ref 22–32)
Calcium: 8.8 mg/dL — ABNORMAL LOW (ref 8.9–10.3)
Chloride: 98 mmol/L (ref 98–111)
Creatinine, Ser: 0.8 mg/dL (ref 0.44–1.00)
GFR, Estimated: >60 mL/min
Glucose, Bld: 109 mg/dL — ABNORMAL HIGH (ref 70–99)
Phosphorus: 3 mg/dL (ref 2.5–4.6)
Potassium: 4.9 mmol/L (ref 3.5–5.1)
Sodium: 137 mmol/L (ref 135–145)

## 2024-07-22 MED ORDER — AMIODARONE HCL 200 MG PO TABS
100.0000 mg | ORAL_TABLET | Freq: Every day | ORAL | 0 refills | Status: AC
  Filled 2024-07-22: qty 15, 30d supply, fill #0

## 2024-07-22 MED ORDER — FUROSEMIDE 20 MG PO TABS: 10.0000 mg | ORAL_TABLET | Freq: Every day | ORAL | 0 refills | Status: AC

## 2024-07-22 MED ORDER — SPIRONOLACTONE 25 MG PO TABS
12.5000 mg | ORAL_TABLET | Freq: Every day | ORAL | 0 refills | Status: AC
  Filled 2024-07-22: qty 15, 30d supply, fill #0

## 2024-07-22 NOTE — Progress Notes (Signed)
 Heart Failure Navigator Progress Note  Assessed for Heart & Vascular TOC clinic readiness.  Patient does not meet criteria due to scheduled [facility] appt for 1/30.   Navigator available for reassessment of patient.   Duwaine Plant, PharmD, BCPS Heart Failure Stewardship Pharmacist Phone [phone]

## 2024-07-22 NOTE — Progress Notes (Signed)
 Discharge Nurse Summary: DC order noted per MD. DC RN at bedside with patient/dtr. Patient agreeable with discharge plan. AVS printed/reviewed. PIVs removed, skin intact. No DME needs. No home meds. TOC meds delivered to the patient. CP/Edu resolved. Telemonitor returned to charging station. All belongings accounted for. NT wheeled patient downstairs for discharge by private auto.   Rosario EMERSON Lund, RN

## 2024-07-22 NOTE — Progress Notes (Signed)
 Patient ID: KAILLY RICHOUX, female   DOB: August 20, 1930, 89 y.o.   MRN: [id number]    Progress Note  Patient Name: Tammy Walker Date of Encounter: 07/22/2024 [facility] Cardiologist: Oneil Parchment, MD   Interval Summary   No acute events overnight.  Feels a lot better.    Vital Signs Vitals:   07/21/24 2011 07/21/24 2344 07/22/24 0339 07/22/24 0735  BP: 107/60 (!) 119/90 102/71 (!) 139/92  Pulse: 86 73  94  Resp: 20 18 18  (!) 22  Temp: 98.1 F (36.7 C) 98.1 F (36.7 C) 97.6 F (36.4 C) 97.7 F (36.5 C)  TempSrc: Oral Oral Oral Oral  SpO2: 94% 97%  98%  Weight:   50.9 kg   Height:        Intake/Output Summary (Last 24 hours) at 07/22/2024 0855 Last data filed at 07/22/2024 0341 Gross per 24 hour  Intake 900 ml  Output 650 ml  Net 250 ml      07/22/2024    3:39 AM 07/19/2024    8:21 PM 07/18/2024   10:56 PM  Last 3 Weights  Weight (lbs) 112 lb 3.2 oz 108 lb 11 oz 116 lb 8 oz  Weight (kg) 50.894 kg 49.3 kg 52.844 kg      Telemetry/ECG  AF LBBB - Personally Reviewed  Physical Exam  GEN: No acute distress.  Frail Neck: JVP flat Cardiac: Irregular RR, soft systolic murmur apex Respiratory: CTA B GI: Soft, nontender, non-distended  MS: No edema  Assessment & Plan  Hypertensive urgency/acute systolic HF Lasix  10mg  Qday Continue spironolactone  12.5mg   Limited TTE 1/20 EF 45%, mild to moderate MR Atrial fibrillation Failed Multaq  CV 10/29/22 CV 12/01/22 07/11/24 HF/AF hospitalization TEE 07/12/24 with small thrombus CV deferred D/C'd 1/14 on amiodarone  200, edoxaban  30, lasix  20mg  TID Readmitted 1/19 Edoxaban  30mg  qday Continue amiodarone  100mg  at bedtime Hyperlipidemia Continue atorvastatin  20mg  LBBB Palliative care Consult yesterday >> DNR DNI Disposition D/C today with cardiology follow up   For questions or updates, please contact [facility] Please consult [email or web address] for contact info under         Signed, Arshan Jabs K Atziry Baranski, MD    Patient ID: Tammy Walker, female   DOB: 1930/12/05, 89 y.o.   MRN: [id number]

## 2024-07-22 NOTE — Progress Notes (Signed)
 Physical Therapy Treatment Patient Details Name: Tammy Walker MRN: [id number] DOB: 1931/06/11 Today's Date: 07/22/2024   History of Present Illness The pt is a 89 yo female presenting 1/19 with SOB and CP. Work up with elevated troponin. Pt recently admitted 1/12-1/14 for CHF exacerbation. PMH includes: CHF, A-fib, L bundle branch block, HTN, HLD, OP, GERD, and extreme curvature of spine and broken vertebrae.   PT Comments  Pt received in recliner and agreeable to PT session. Able to progress by increasing gait distance to 35ft with supervision and RW. Discussed pacing with pt able to determine when she needed to sit and rest. Discussed LE HEP with pt able to demonstrate and talk through exercises that she performed prior to hospital admit. [name] arrived during session with conversation about HHPT and monitoring vitals/BP at home. Pt/family feel comfortable d/c home when medically stable. Continue to recommend HHPT with acute PT to follow.     If plan is discharge home, recommend the following: A little help with walking and/or transfers;A little help with bathing/dressing/bathroom;Assist for transportation;Assistance with cooking/housework   Can travel by private vehicle      Yes  Equipment Recommendations  None recommended by PT       Precautions / Restrictions Precautions Precautions: Fall Recall of Precautions/Restrictions: Intact Restrictions Weight Bearing Restrictions Per Provider Order: No     Mobility  Bed Mobility  General bed mobility comments: NT received in recliner    Transfers Overall transfer level: Modified independent Equipment used: Rolling walker (2 wheels)   General transfer comment: ModI, however, impulsive with pt standing quickly without RW in proper place    Ambulation/Gait Ambulation/Gait assistance: Supervision Gait Distance (Feet): 60 Feet Assistive device: Rolling walker (2 wheels) Gait Pattern/deviations: Step-through pattern, Decreased  stride length, Decreased dorsiflexion - right, Decreased dorsiflexion - left, Shuffle, Trunk flexed Gait velocity: decr    General Gait Details: reciprocal gait pattern with moderate reliance on UE support. Decreased foot clearance with further distance limited by fatigue     Balance Overall balance assessment: Needs assistance Sitting-balance support: Feet supported Sitting balance-Leahy Scale: Good Sitting balance - Comments: seated EOB   Standing balance support: No upper extremity supported, Bilateral upper extremity supported Standing balance-Leahy Scale: Fair       Hotel Manager: Impaired Factors Affecting Communication: Hearing impaired (has hearing aides)  Cognition Arousal: Alert Behavior During Therapy: WFL for tasks assessed/performed, Impulsive   PT - Cognitive impairments: No apparent impairments    Following commands: Intact      Cueing Cueing Techniques: Verbal cues, Gestural cues  Exercises General Exercises - Lower Extremity Hip Flexion/Marching: AROM, Both, 10 reps, Standing    General Comments General comments (skin integrity, edema, etc.): Discussed LE hep that pt typically performs at home. Also discussed HHPT recommendations and monitoring of HR/O2 with pulse oximeter      Pertinent Vitals/Pain Pain Assessment Pain Assessment: No/denies pain     PT Goals (current goals can now be found in the care plan section) Acute Rehab PT Goals PT Goal Formulation: With patient Time For Goal Achievement: 08/03/24 Potential to Achieve Goals: Good Progress towards PT goals: Progressing toward goals    Frequency    Min 1X/week       AM-PAC PT 6 Clicks Mobility   Outcome Measure  Help needed turning from your back to your side while in a flat bed without using bedrails?: None Help needed moving from lying on your back to sitting on the side of a  flat bed without using bedrails?: None Help needed moving to and from a bed  to a chair (including a wheelchair)?: A Little Help needed standing up from a chair using your arms (e.g., wheelchair or bedside chair)?: A Little Help needed to walk in hospital room?: A Little Help needed climbing 3-5 steps with a railing? : A Little 6 Click Score: 20    End of Session   Activity Tolerance: Patient tolerated treatment well Patient left: in chair;with call bell/phone within reach;with family/visitor present Nurse Communication: Mobility status PT Visit Diagnosis: Other abnormalities of gait and mobility (R26.89);Muscle weakness (generalized) (M62.81)     Time: 8998-8974 PT Time Calculation (min) (ACUTE ONLY): 24 min  Charges:    $Gait Training: 8-22 mins $Therapeutic Activity: 8-22 mins PT General Charges $$ ACUTE PT VISIT: 1 Visit                    Kate ORN, PT, DPT Secure Chat Preferred  Rehab Office [phone]    Kate BRAVO [name] 07/22/2024, 10:50 AM

## 2024-07-22 NOTE — Progress Notes (Signed)
 Occupational Therapy Treatment Patient Details Name: Tammy Walker MRN: [id number] DOB: 1931-03-14 Today's Date: 07/22/2024   History of present illness The pt is a 89 yo female presenting 1/19 with SOB and CP. Work up with elevated troponin. Pt recently admitted 1/12-1/14 for CHF exacerbation. PMH includes: CHF, A-fib, L bundle branch block, HTN, HLD, OP, GERD, and extreme curvature of spine and broken vertebrae.   OT comments  Patient demonstrating good gains with OT treatment. Patient was supervision for bed mobility, transfers with rollator, and self care at sink. Patient on RA with SpO2 92% during self care tasks and 99-100% at rest. Patient is expected to discharge today with family to assist.       If plan is discharge home, recommend the following:  Assistance with cooking/housework;Assist for transportation;Help with stairs or ramp for entrance;A little help with bathing/dressing/bathroom   Equipment Recommendations  None recommended by OT    Recommendations for Other Services      Precautions / Restrictions Precautions Precautions: Fall Recall of Precautions/Restrictions: Intact Restrictions Weight Bearing Restrictions Per Provider Order: No       Mobility Bed Mobility Overal bed mobility: Needs Assistance Bed Mobility: Supine to Sit     Supine to sit: Supervision     General bed mobility comments: no physical assistance needed to get to EOB    Transfers Overall transfer level: Needs assistance Equipment used: Rollator (4 wheels) Transfers: Sit to/from Stand Sit to Stand: Supervision           General transfer comment: impulsive when standing from EOB, stood before therapist could clear lines. cues for safety     Balance Overall balance assessment: Needs assistance Sitting-balance support: Feet supported Sitting balance-Leahy Scale: Good Sitting balance - Comments: seated EOB   Standing balance support: No upper extremity supported, Bilateral upper  extremity supported Standing balance-Leahy Scale: Fair Standing balance comment: stood at sink for self care tasks with supervision                           ADL either performed or assessed with clinical judgement   ADL Overall ADL's : Needs assistance/impaired     Grooming: Wash/dry hands;Wash/dry face;Oral care;Brushing hair;Supervision/safety;Standing   Upper Body Bathing: Supervision/ safety;Standing                             General ADL Comments: seated rest break following self care tasks with SpO2 92% on RA    Extremity/Trunk Assessment              Vision       Perception     Praxis     Communication Communication Communication: Impaired Factors Affecting Communication: Hearing impaired (has hearing aides)   Cognition Arousal: Alert Behavior During Therapy: WFL for tasks assessed/performed, Impulsive Cognition: No apparent impairments             OT - Cognition Comments: impulsive with mobility                 Following commands: Intact        Cueing   Cueing Techniques: Verbal cues, Gestural cues  Exercises      Shoulder Instructions       General Comments seated BP 145/82 (99) SpO2 100% on RA, standing BP 133/83 (97) SpO2 100% on RA    Pertinent Vitals/ Pain       Pain Assessment Pain  Assessment: No/denies pain  Home Living                                          Prior Functioning/Environment              Frequency  Min 2X/week        Progress Toward Goals  OT Goals(current goals can now be found in the care plan section)  Progress towards OT goals: Progressing toward goals  Acute Rehab OT Goals Patient Stated Goal: to go home OT Goal Formulation: With patient Time For Goal Achievement: 08/03/24 Potential to Achieve Goals: Fair ADL Goals Pt Will Transfer to Toilet: with modified independence Additional ADL Goal #1: pt will be mod I for OOB ADL 5+ mins with  VSS Additional ADL Goal #2: Pt will be able to complete 5 mins of standing ADLS to prepare for the return to home  Plan      Co-evaluation                 AM-PAC OT 6 Clicks Daily Activity     Outcome Measure   Help from another person eating meals?: None Help from another person taking care of personal grooming?: A Little Help from another person toileting, which includes using toliet, bedpan, or urinal?: A Little Help from another person bathing (including washing, rinsing, drying)?: A Little Help from another person to put on and taking off regular upper body clothing?: A Little Help from another person to put on and taking off regular lower body clothing?: A Little 6 Click Score: 19    End of Session Equipment Utilized During Treatment: Rollator (4 wheels)  OT Visit Diagnosis: Unsteadiness on feet (R26.81);Other abnormalities of gait and mobility (R26.89);Muscle weakness (generalized) (M62.81)   Activity Tolerance Patient tolerated treatment well   Patient Left in chair;with call bell/phone within reach;with chair alarm set;with family/visitor present   Nurse Communication Mobility status        Time: 9158-9097 OT Time Calculation (min): 21 min  Charges: OT General Charges $OT Visit: 1 Visit OT Treatments $Self Care/Home Management : 8-22 mins  Dick Laine, OTA Acute Rehabilitation Services  Office [phone]   Jeb LITTIE Laine 07/22/2024, 10:43 AM

## 2024-07-22 NOTE — Discharge Summary (Addendum)
 " Physician Discharge Summary   Patient: Tammy Walker MRN: [id number] DOB: March 03, 1931  Admit date:     07/18/2024  Discharge date: 07/22/24  Discharge Physician: Elidia Sieving Lailynn Southgate   PCP: Alben Therisa MATSU, PA   Recommendations at discharge:    Patient has been placed on spironolactone  for RAAS inhibition, and increased furosemide  to 10 mg po daily. Instructed to take furosemide  10 mg bid in case of volume overload, weight gain 2 to 3 lbs in 24 hrs or 5 lbs in 7 days Fluid restriction to no more than 60 oz per day  Amiodarone  decreased to 100 mg po daily  Follow up renal function and electrolytes in 7 days as outpatient Follow up with Therisa Alben MATSU PA in 7 to 10 days Follow up with Cardiology as scheduled   I spoke with patient's daughter at the bedside, we talked in detail about patient's condition, plan of care and prognosis and all questions were addressed.   Discharge Diagnoses: Principal Problem:   Acute on chronic diastolic CHF (congestive heart failure) (HCC) Active Problems:   Essential hypertension   Paroxysmal atrial fibrillation (HCC)   Hyperlipidemia   Hiatal hernia   Protein-calorie malnutrition, severe  Resolved Problems:   * No resolved hospital problems. [facility] Course: Tammy Walker was admitted to the hospital with the working diagnosis of heart failure exacerbation   89 yo female with the past medical history of atrial fibrillation, hypertension, heart failure, hyperlipidemia and osteoporosis who presented with dyspnea and chest pain.  1/12 to 07/13/24 recent hospitalization for heart failure and atrial fibrillation. Converted to sinus rhythm on amiodarone , and was diuresed with furosemide . Instructed to take furosemide  three times per week.  On current hospitalization she was brought back to the hospital with due acute onset of chest pain, while coming out of the restroom. EMS was called, she was found edematous with blood pressure 200 mmHg systolic and in  respiratory distress. She was placed on Cpap, given nitroglycerin  sublingual and was transported to the ED.  On her initial physical examination her blood pressure was 113/78, HR 81, RR 20 and 02 saturation 100%, lungs with no wheezing or rhonchi, heart with S1 and S2 present and regular, abdomen with no distention and lower extremity edema.   Na 135, K 4,7 Cl 96 bicarbonate 27 glucose 166 bun 14 cr 0,90  BNP 1,697 High sensitive troponin 15  Wbc 8,0 hgb 14.1 plt 302   Patient had furosemide  and added spironolactone  . 01/22 improved volume status, possible discharge tomorrow.  01/23 patient with improvement in volume status, will continue diuretic and follow up as outpatient.   Assessment and Plan: * Acute on chronic diastolic CHF (congestive heart failure) (HCC) Echocardiogram with mild reduction in systolic function EF 40 to 45%, global hypokinesis, RV systolic function preserved, LA with severe dilatation, mild to moderate mitral valve regurgitation, no pericardial effusion   Patient was placed on IV furosemide  for diuresis, negative fluid balance was achieved with improvement in her symptoms.   Plan to continue spironolactone  for RAAS inhibition and loop diuretic to prevent volume overload.  Possible addition of SGLT 2 inh as outpatient.  Limited medical therapy due to risk of hypotension and frailty   Acute hypoxemic respiratory failure due to acute cardiogenic pulmonary edema.  Oxygenation has improved, 02 saturation today 97% on room air.    High sensitive troponin elevation due to likely volume overloaded heart failure, vs demand ischemia, NSTEMI has been ruled.   Considering  patient's condition and advanced directives continue conservative therapy   Essential hypertension No antihypertensive agents due to risk of hypotension.  Tolerating well spironolactone    Paroxysmal atrial fibrillation (HCC) Continue amiodarone  Continue anticoagulation with  edoxaban   Hyperlipidemia Continue statin   Hiatal hernia Continue pantoprazole    Protein-calorie malnutrition, severe Continue nutritional supplements       Consultants: cardiology  Procedures performed: none   Disposition: Home Diet recommendation:  Cardiac diet 60 oz daily fluid restriction  DISCHARGE MEDICATION: Allergies as of 07/22/2024   No Known Allergies      Medication List     TAKE these medications    acetaminophen  500 MG tablet Commonly known as: TYLENOL  2 tablets in the AM, 1 tablet in the afternoon, and 1 tablet at night Orally three times a day   amiodarone  100 MG tablet Commonly known as: PACERONE  Take 1 tablet (100 mg total) by mouth at bedtime. What changed:  medication strength how much to take when to take this   atorvastatin  20 MG tablet Commonly known as: LIPITOR TAKE 1 TABLET(20 MG) BY MOUTH AT BEDTIME   bisacodyl  5 MG EC tablet Commonly known as: DULCOLAX Take 10 mg by mouth at bedtime.   carboxymethylcellulose 0.5 % Soln Commonly known as: REFRESH PLUS Place 1 drop into both eyes every 4 (four) hours as needed (eye irritatation).   cetirizine 10 MG tablet Commonly known as: ZYRTEC Take 10 mg by mouth daily.   CHEWABLE CALCIUM /D3 PO Take 2 each by mouth 2 (two) times daily with a meal. Vitafusion; calcium  + D3 gummies   CALCIUM  + VITAMIN D3 PO Take 1 tablet by mouth 2 (two) times daily with a meal.   denosumab  60 MG/ML Sosy injection Commonly known as: PROLIA  Inject 60 mg into the skin every 6 (six) months.   diclofenac  Sodium 1 % Gel Commonly known as: VOLTAREN  Apply 1 Application topically 4 (four) times daily as needed (pain).   edoxaban  30 MG Tabs tablet Commonly known as: Savaysa  Take 1 tablet (30 mg total) by mouth daily. What changed: when to take this   furosemide  20 MG tablet Commonly known as: LASIX  Take 0.5 tablets (10 mg total) by mouth daily. Start taking on: July 23, 2024 What changed:  how much  to take when to take this   hydrocortisone  2.5 % rectal cream Commonly known as: ANUSOL -HC Apply 1 Application topically 2 (two) times daily as needed for hemorrhoids (rectal pain).   ipratropium 0.03 % nasal spray Commonly known as: ATROVENT  Place 2 sprays into both nostrils every 12 (twelve) hours.   Linzess 72 MCG capsule Generic drug: linaclotide Take 72 mcg by mouth daily as needed.   ondansetron  4 MG tablet Commonly known as: Zofran  Take 1 tablet (4 mg total) by mouth every 8 (eight) hours as needed for nausea or vomiting.   oxyCODONE  5 MG immediate release tablet Commonly known as: Oxy IR/ROXICODONE  Take 0.5 tablets (2.5 mg total) by mouth every 12 (twelve) hours as needed for severe pain (pain score 7-10).   oxyCODONE  10 mg 12 hr tablet Commonly known as: OxyCONTIN  Take 1 tablet (10 mg total) by mouth every 12 (twelve) hours.   phenylephrine  0.25 % suppository Commonly known as: (USE for PREPARATION-H) Place 1 suppository rectally 2 (two) times daily as needed for hemorrhoids (rectal pain).   polyethylene glycol 17 g packet Commonly known as: MIRALAX  / GLYCOLAX  Take 17 g by mouth 2 (two) times daily.   pregabalin  75 MG capsule Commonly known as:  LYRICA  Take 1 capsule (75 mg total) by mouth daily. What changed: additional instructions   pregabalin  25 MG capsule Commonly known as: LYRICA  Take 1 capsule (25 mg total) by mouth See admin instructions. Taking 75 mg by mouth in the morning, 25 mg afternoon and 25 mg in the evening What changed:  when to take this additional instructions   PREPARATION H EX Apply 1 Application topically every 6 (six) hours as needed (rectal pain, hemorroids).   PRESERVISION AREDS 2 PO Take 1 capsule by mouth 2 (two) times daily with a meal.   sodium chloride  0.65 % nasal spray Commonly known as: OCEAN Place 1-2 sprays into the nose every 4 (four) hours as needed (sinus irritation).   spironolactone  25 MG tablet Commonly known  as: ALDACTONE  Take 0.5 tablets (12.5 mg total) by mouth daily. Start taking on: July 23, 2024   Systane Nighttime Oint Place 1 application  into both eyes at bedtime as needed (dry eyes).   triamcinolone cream 0.1 % Commonly known as: KENALOG Apply 1 Application topically 2 (two) times daily as needed (irritation).        Discharge Exam: Filed Weights   07/18/24 2256 07/19/24 2021 07/22/24 0339  Weight: 52.8 kg 49.3 kg 50.9 kg   BP (!) 139/92 (BP Location: Left Arm)   Pulse 94   Temp 97.7 F (36.5 C) (Oral)   Resp (!) 22   Ht 4' 11 (1.499 m)   Wt 50.9 kg   SpO2 98%   BMI 22.66 kg/m   Patient with no chest pain or dyspnea, no PND, orthopnea or lower extremity edema  Neurology awake and alert ENT with mild pallor with no icterus Cardiovascular with S1 and S2 present and regular with no gallops or rubs, positive systolic murmur at the apex No JVD Respiratory with no rales or wheezing, no rhonchi Abdomen with no distention  No lower extremity edema   Condition at discharge: stable  The results of significant diagnostics from this hospitalization (including imaging, microbiology, ancillary and laboratory) are listed below for reference.   Imaging Studies: ECHOCARDIOGRAM LIMITED Result Date: 07/19/2024    ECHOCARDIOGRAM LIMITED REPORT   Patient Name:   Tammy Walker Date of Exam: 07/19/2024 Medical Rec #:  [id number]      Height:       59.0 in Accession #:    [id number]     Weight:       116.5 lb Date of Birth:  [birth year]      BSA:          1.466 m Patient Age:    89 years       BP:           120/70 mmHg Patient Gender: F              HR:           85 bpm. Exam Location:  Inpatient Procedure: Limited Echo, Cardiac Doppler and Color Doppler (Both Spectral and            Color Flow Doppler were utilized during procedure). Indications:    Chest Pain  History:        Patient has prior history of Echocardiogram examinations, most                 recent 07/11/2024. CHF,  Arrythmias:Atrial Fibrillation, LBBB and                 Atrial Flutter, Signs/Symptoms:Chest Pain; Risk  Factors:Hypertension and Dyslipidemia.  Sonographer:    Juliene Rucks Referring Phys: MARSHA ADA IMPRESSIONS  1. Left ventricular ejection fraction, by estimation, is 40 to 45%. The left ventricle has mildly decreased function. The left ventricle demonstrates global hypokinesis. Left ventricular diastolic function could not be evaluated.  2. Right ventricular systolic function is normal. The right ventricular size is normal. Tricuspid regurgitation signal is inadequate for assessing PA pressure.  3. Left atrial size was severely dilated.  4. The mitral valve is degenerative. Mild to moderate mitral valve regurgitation. No evidence of mitral stenosis.  5. The aortic valve is tricuspid. Aortic valve regurgitation is mild. Aortic valve sclerosis/calcification is present, without any evidence of aortic stenosis. Comparison(s): No significant change from prior study. Prior images reviewed side by side. FINDINGS  Left Ventricle: Left ventricular ejection fraction, by estimation, is 40 to 45%. The left ventricle has mildly decreased function. The left ventricle demonstrates global hypokinesis. Abnormal (paradoxical) septal motion, consistent with left bundle branch block. Left ventricular diastolic function could not be evaluated. Left ventricular diastolic function could not be evaluated due to atrial fibrillation. Right Ventricle: The right ventricular size is normal. No increase in right ventricular wall thickness. Right ventricular systolic function is normal. Tricuspid regurgitation signal is inadequate for assessing PA pressure. Left Atrium: Left atrial size was severely dilated. Right Atrium: Right atrial size was normal in size. Pericardium: There is no evidence of pericardial effusion. Mitral Valve: The mitral valve is degenerative in appearance. Mild to moderate mitral annular calcification.  Mild to moderate mitral valve regurgitation, with centrally-directed jet. No evidence of mitral valve stenosis. Aortic Valve: The aortic valve is tricuspid. Aortic valve regurgitation is mild. Aortic valve sclerosis/calcification is present, without any evidence of aortic stenosis. Pulmonic Valve: The pulmonic valve was grossly normal. Pulmonic valve regurgitation is not visualized. No evidence of pulmonic stenosis. Aorta: The aortic root is normal in size and structure. LEFT VENTRICLE PLAX 2D LVIDd:         3.90 cm LVIDs:         3.00 cm LV PW:         1.10 cm LV IVS:        1.10 cm LVOT diam:     2.10 cm LVOT Area:     3.46 cm  LEFT ATRIUM            Index LA diam:      2.80 cm  1.91 cm/m LA Vol (A4C): 114.0 ml 77.77 ml/m   AORTA Ao Root diam: 3.30 cm MITRAL VALVE MV Area (PHT): 4.39 cm    SHUNTS MV Decel Time: 173 msec    Systemic Diam: 2.10 cm MV E velocity: 96.40 cm/s Jerel Croitoru MD Electronically signed by Jerel Balding MD Signature Date/Time: 07/19/2024/6:30:26 PM    Final    DG Chest Portable 1 View Result Date: 07/18/2024 CLINICAL DATA:  Shortness of breath EXAM: PORTABLE CHEST 1 VIEW COMPARISON:  07/11/2024, 10/15/2022 FINDINGS: Moderate to large hiatal hernia. Cardiomegaly with aortic atherosclerosis. Possible minimal patchy atelectasis or infiltrate left base. Old rib fractures IMPRESSION: Cardiomegaly. Possible minimal patchy atelectasis or infiltrate at the left base. Moderate to large hiatal hernia. Electronically Signed   By: Luke Bun M.D.   On: 07/18/2024 23:44   ECHO TEE Result Date: 07/12/2024    TRANSESOPHOGEAL ECHO REPORT   Patient Name:   Tammy Walker Date of Exam: 07/12/2024 Medical Rec #:  [id number]      Height:  59.0 in Accession #:    [id number]     Weight:       115.0 lb Date of Birth:  02/27/1931      BSA:          1.458 m Patient Age:    89 years       BP:           134/95 mmHg Patient Gender: F              HR:           96 bpm. Exam Location:  Inpatient  Procedure: Transesophageal Echo, Color Doppler, Cardiac Doppler and Intracardiac            Opacification Agent (Both Spectral and Color Flow Doppler were            utilized during procedure). Indications:     Atrial Fibrillation  History:         Patient has prior history of Echocardiogram examinations, most                  recent 07/11/2024. Arrythmias:Atrial Fibrillation; Risk                  Factors:Hypertension and Dyslipidemia.  Sonographer:     Koleen Popper RDCS Referring Phys:  [id number] [facility] Diagnosing Phys: Annabella Scarce MD  Sonographer Comments: Technically difficult study due to poor echo windows. Image acquisition challenging due to patient body habitus. PROCEDURE: After discussion of the risks and benefits of a TEE, an informed consent was obtained from the patient. The transesophogeal probe was passed without difficulty through the esophogus of the patient. Imaged were obtained with the patient in a left lateral decubitus position. Sedation performed by different physician. The patient was monitored while under deep sedation. Anesthestetic sedation was provided intravenously by Anesthesiology: 238mg  of Propofol , 100mg  of Lidocaine . Image quality was  good. The patient's vital signs; including heart rate, blood pressure, and oxygen saturation; remained stable throughout the procedure. Supplementary images were obtained from transthoracic windows as indicated to answer the clinical question. The patient developed no complications during the procedure. Transgastric views not obtained: inability to pass probe.  IMPRESSIONS  1. Left ventricular ejection fraction, by estimation, is 30 to 35%. The left ventricle has moderately decreased function. The left ventricle demonstrates global hypokinesis.  2. Right ventricular systolic function is normal. The right ventricular size is normal.  3. There is a small mobile density in the left atrial appendage with motion independent of the atrium.  Consistent with thrombus. Measures 0.7 x 0.3 cm. Left atrial size was severely dilated. A left atrial/left atrial appendage thrombus was detected. The  LAA emptying velocity was 21 cm/s.  4. Right atrial size was severely dilated.  5. The mitral valve is normal in structure. Moderate mitral valve regurgitation. No evidence of mitral stenosis.  6. The aortic valve is tricuspid. Aortic valve regurgitation is mild. No aortic stenosis is present.  7. The inferior vena cava is normal in size with greater than 50% respiratory variability, suggesting right atrial pressure of 3 mmHg. Conclusion(s)/Recommendation(s): Findings concerning for LA/LAA thrombus. Cardioversion was not performed. Would recommend 4 weeks of anticoagulation prior to further attempts at cardioversion. FINDINGS  Left Ventricle: Left ventricular ejection fraction, by estimation, is 30 to 35%. The left ventricle has moderately decreased function. The left ventricle demonstrates global hypokinesis. Definity  contrast agent was given IV to delineate the left ventricular endocardial borders. The left ventricular internal cavity size was  normal in size. There is no left ventricular hypertrophy. Right Ventricle: The right ventricular size is normal. No increase in right ventricular wall thickness. Right ventricular systolic function is normal. Left Atrium: There is a small mobile density in the left atrial appendage with motion independent of the atrium. Consistent with thrombus. Measures 0.7 x 0.3 cm. Left atrial size was severely dilated. A left atrial/left atrial appendage thrombus was detected. The LAA emptying velocity was 21 cm/s. Right Atrium: Right atrial size was severely dilated. Pericardium: There is no evidence of pericardial effusion. Mitral Valve: The mitral valve is normal in structure. Moderate mitral valve regurgitation. No evidence of mitral valve stenosis. Tricuspid Valve: The tricuspid valve is normal in structure. Tricuspid valve  regurgitation is not demonstrated. No evidence of tricuspid stenosis. Aortic Valve: The aortic valve is tricuspid. Aortic valve regurgitation is mild. No aortic stenosis is present. Pulmonic Valve: The pulmonic valve was normal in structure. Pulmonic valve regurgitation is not visualized. No evidence of pulmonic stenosis. Aorta: The aortic root is normal in size and structure. Venous: The inferior vena cava is normal in size with greater than 50% respiratory variability, suggesting right atrial pressure of 3 mmHg. IAS/Shunts: No atrial level shunt detected by color flow Doppler. Additional Comments: Technically challenging images due to large hiatal hernia. Unable to obtain transgastric images.  AORTA Ao Root diam: 3.10 cm Ao Asc diam:  3.50 cm MITRAL VALVE                  TRICUSPID VALVE MV VTI:      1.46 m           TR Peak grad:   33.2 mmHg MR Peak grad:    84.6 mmHg    TR Vmax:        288.00 cm/s MR Vmax:         460.00 cm/s MR Vmean:        336.0 cm/s MR PISA Nyquist: 0.3 m/s MR PISA:         3.08 cm MR PISA Radius:  0.70 cm Annabella Scarce MD Electronically signed by Annabella Scarce MD Signature Date/Time: 07/12/2024/4:32:31 PM    Final    EP STUDY Result Date: 07/12/2024 See surgical note for result.  ECHOCARDIOGRAM COMPLETE Result Date: 07/11/2024    ECHOCARDIOGRAM REPORT   Patient Name:   Tammy Walker Date of Exam: 07/11/2024 Medical Rec #:  [id number]      Height:       60.0 in Accession #:    [id number]     Weight:       115.0 lb Date of Birth:  1930-11-18      BSA:          1.475 m Patient Age:    89 years       BP:           135/80 mmHg Patient Gender: F              HR:           104 bpm. Exam Location:  Inpatient Procedure: 2D Echo, Cardiac Doppler and Color Doppler (Both Spectral and Color            Flow Doppler were utilized during procedure). Indications:    CHF  History:        Patient has prior history of Echocardiogram examinations, most                 recent 10/17/2022.  Arrythmias:Atrial  Fibrillation; Risk                 Factors:Dyslipidemia and Hypertension.  Sonographer:    Philomena Daring Referring Phys: [id number] RONDELL A SMITH IMPRESSIONS  1. Left ventricular ejection fraction, by estimation, is 35 to 40%. The left ventricle has moderately decreased function. The left ventricle demonstrates global hypokinesis. There is moderate asymmetric left ventricular hypertrophy of the basal-septal segment. Left ventricular diastolic parameters are indeterminate. Elevated left atrial pressure.  2. Right ventricular systolic function is normal. The right ventricular size is normal.  3. Left atrial size was severely dilated.  4. Right atrial size was severely dilated.  5. The mitral valve is abnormal. Moderate mitral valve regurgitation. No evidence of mitral stenosis.  6. Tricuspid valve regurgitation is mild to moderate.  7. The aortic valve is tricuspid. There is moderate calcification of the aortic valve. Aortic valve regurgitation is mild. Aortic valve sclerosis/calcification is present, without any evidence of aortic stenosis. Comparison(s): Prior images reviewed side by side. Changes from prior study are noted. EF slightly worse compared to prior. FINDINGS  Left Ventricle: Left ventricular ejection fraction, by estimation, is 35 to 40%. The left ventricle has moderately decreased function. The left ventricle demonstrates global hypokinesis. The left ventricular internal cavity size was normal in size. There is moderate asymmetric left ventricular hypertrophy of the basal-septal segment. Abnormal (paradoxical) septal motion, consistent with left bundle branch block. Left ventricular diastolic parameters are indeterminate. Elevated left atrial pressure. Right Ventricle: The right ventricular size is normal. No increase in right ventricular wall thickness. Right ventricular systolic function is normal. Left Atrium: Left atrial size was severely dilated. Right Atrium: Right atrial size was  severely dilated. Pericardium: There is no evidence of pericardial effusion. Mitral Valve: The mitral valve is abnormal. There is mild thickening of the mitral valve leaflet(s). There is mild calcification of the mitral valve leaflet(s). Mild to moderate mitral annular calcification. Moderate mitral valve regurgitation. No evidence of mitral valve stenosis. Tricuspid Valve: The tricuspid valve is normal in structure. Tricuspid valve regurgitation is mild to moderate. No evidence of tricuspid stenosis. Aortic Valve: The aortic valve is tricuspid. There is moderate calcification of the aortic valve. Aortic valve regurgitation is mild. Aortic valve sclerosis/calcification is present, without any evidence of aortic stenosis. Pulmonic Valve: The pulmonic valve was not well visualized. Pulmonic valve regurgitation is not visualized. No evidence of pulmonic stenosis. Aorta: The aortic root and ascending aorta are structurally normal, with no evidence of dilitation. Venous: The inferior vena cava was not well visualized. IAS/Shunts: The atrial septum is grossly normal.  LEFT VENTRICLE PLAX 2D LVIDd:         3.30 cm     Diastology LVIDs:         2.60 cm     LV e' medial:    5.33 cm/s LV PW:         0.80 cm     LV E/e' medial:  19.1 LV IVS:        1.80 cm     LV e' lateral:   7.62 cm/s LVOT diam:     2.10 cm     LV E/e' lateral: 13.4 LV SV:         38 LV SV Index:   26 LVOT Area:     3.46 cm  LV Volumes (MOD) LV vol d, MOD A2C: 53.5 ml LV vol d, MOD A4C: 59.6 ml LV vol s, MOD A2C: 34.0 ml LV vol s,  MOD A4C: 32.8 ml LV SV MOD A2C:     19.5 ml LV SV MOD A4C:     59.6 ml LV SV MOD BP:      24.6 ml RIGHT VENTRICLE RV Basal diam:  3.90 cm RV Mid diam:    2.90 cm RV S prime:     14.10 cm/s TAPSE (M-mode): 1.9 cm LEFT ATRIUM             Index        RIGHT ATRIUM           Index LA diam:        4.50 cm 3.05 cm/m   RA Area:     15.30 cm LA Vol (A2C):   88.0 ml 59.64 ml/m  RA Volume:   39.20 ml  26.57 ml/m LA Vol (A4C):   63.4 ml  42.97 ml/m LA Biplane Vol: 78.3 ml 53.07 ml/m  AORTIC VALVE LVOT Vmax:   70.20 cm/s LVOT Vmean:  44.000 cm/s LVOT VTI:    0.109 m  AORTA Ao Root diam: 3.00 cm Ao Asc diam:  3.50 cm MITRAL VALVE                TRICUSPID VALVE MV Area (PHT): 4.77 cm     TR Peak grad:   40.7 mmHg MV Decel Time: 159 msec     TR Vmax:        319.00 cm/s MV E velocity: 102.00 cm/s                             SHUNTS                             Systemic VTI:  0.11 m                             Systemic Diam: 2.10 cm Shelda Bruckner MD Electronically signed by Shelda Bruckner MD Signature Date/Time: 07/11/2024/6:56:03 PM    Final    DG Chest 1 View Result Date: 07/11/2024 EXAM: 1 VIEW(S) XRAY OF THE CHEST 07/11/2024 02:57:00 AM COMPARISON: Chest x-ray 10/15/2022. CLINICAL HISTORY: short of breath, chest pain FINDINGS: LUNGS AND PLEURA: Patient is rotated. Chronic coarsened interstitial markings. No focal pulmonary opacity. No pleural effusion. No pneumothorax. HEART AND MEDIASTINUM: Atherosclerotic plaque. Large hiatal hernia retrocardiac. BONES AND SOFT TISSUES: No acute osseous abnormality. IMPRESSION: 1. No acute cardiopulmonary abnormality. 2. Large retrocardiac hiatal hernia. Electronically signed by: Morgane Naveau MD MD 07/11/2024 03:02 AM EST RP Workstation: [id number]    Microbiology: Results for orders placed or performed during the hospital encounter of 09/05/21  Urine Culture     Status: None   Collection Time: 09/05/21  9:50 AM   Specimen: In/Out Cath Urine  Result Value Ref Range Status   Specimen Description IN/OUT CATH URINE  Final   Special Requests NONE  Final   Culture   Final    NO GROWTH Performed at [facility], [address], [place], [place] [address]    Report Status 09/06/2021 FINAL  Final  Resp Panel by RT-PCR (Flu A&B, Covid) Nasopharyngeal Swab     Status: None   Collection Time: 09/05/21  9:51 AM   Specimen: Nasopharyngeal Swab; Nasopharyngeal(NP) swabs in vial  transport medium  Result Value Ref Range Status   SARS Coronavirus 2 by RT PCR  NEGATIVE NEGATIVE Final    Comment: (NOTE) SARS-CoV-2 target nucleic acids are NOT DETECTED.  The SARS-CoV-2 RNA is generally detectable in upper respiratory specimens during the acute phase of infection. The lowest concentration of SARS-CoV-2 viral copies this assay can detect is 138 copies/mL. A negative result does not preclude SARS-Cov-2 infection and should not be used as the sole basis for treatment or other patient management decisions. A negative result may occur with  improper specimen collection/handling, submission of specimen other than nasopharyngeal swab, presence of viral mutation(s) within the areas targeted by this assay, and inadequate number of viral copies(<138 copies/mL). A negative result must be combined with clinical observations, patient history, and epidemiological information. The expected result is Negative.  Fact Sheet for Patients:  bloggercourse.com  Fact Sheet for Healthcare Providers:  seriousbroker.it  This test is no t yet approved or cleared by the [place]  FDA and  has been authorized for detection and/or diagnosis of SARS-CoV-2 by FDA under an Emergency Use Authorization (EUA). This EUA will remain  in effect (meaning this test can be used) for the duration of the COVID-19 declaration under Section 564(b)(1) of the Act, 21 U.S.C.section 360bbb-3(b)(1), unless the authorization is terminated  or revoked sooner.       Influenza A by PCR NEGATIVE NEGATIVE Final   Influenza B by PCR NEGATIVE NEGATIVE Final    Comment: (NOTE) The Xpert Xpress SARS-CoV-2/FLU/RSV plus assay is intended as an aid in the diagnosis of influenza from Nasopharyngeal swab specimens and should not be used as a sole basis for treatment. Nasal washings and aspirates are unacceptable for Xpert Xpress SARS-CoV-2/FLU/RSV testing.  Fact  Sheet for Patients: bloggercourse.com  Fact Sheet for Healthcare Providers: seriousbroker.it  This test is not yet approved or cleared by the [place]  FDA and has been authorized for detection and/or diagnosis of SARS-CoV-2 by FDA under an Emergency Use Authorization (EUA). This EUA will remain in effect (meaning this test can be used) for the duration of the COVID-19 declaration under Section 564(b)(1) of the Act, 21 U.S.C. section 360bbb-3(b)(1), unless the authorization is terminated or revoked.  Performed at [facility], [address], [place], [place] [address]   Blood Culture (routine x 2)     Status: None   Collection Time: 09/05/21 10:06 AM   Specimen: Right Antecubital; Blood  Result Value Ref Range Status   Specimen Description RIGHT ANTECUBITAL  Final   Special Requests   Final    BOTTLES DRAWN AEROBIC AND ANAEROBIC Blood Culture results may not be optimal due to an inadequate volume of blood received in culture bottles   Culture   Final    NO GROWTH 5 DAYS Performed at [facility], [address], [place], [place] [address]    Report Status 09/10/2021 FINAL  Final  Blood Culture (routine x 2)     Status: None   Collection Time: 09/05/21 10:30 AM   Specimen: BLOOD LEFT HAND  Result Value Ref Range Status   Specimen Description BLOOD LEFT HAND  Final   Special Requests   Final    BOTTLES DRAWN AEROBIC AND ANAEROBIC Blood Culture results may not be optimal due to an inadequate volume of blood received in culture bottles   Culture   Final    NO GROWTH 5 DAYS Performed at [facility], [address], [place], [place] [address]    Report Status 09/10/2021 FINAL  Final  MRSA Next Gen by PCR, Nasal  Status: None   Collection Time: 09/05/21  6:00 PM   Specimen: Nasal Mucosa; Nasal Swab  Result Value Ref Range Status   MRSA by PCR Next Gen NOT DETECTED NOT DETECTED Final    Comment:  (NOTE) The GeneXpert MRSA Assay (FDA approved for NASAL specimens only), is one component of a comprehensive MRSA colonization surveillance program. It is not intended to diagnose MRSA infection nor to guide or monitor treatment for MRSA infections. Test performance is not FDA approved in patients less than 20 years old. Performed at [facility], [address], [place], [place] [address]     Labs: CBC: Recent Labs  Lab 07/18/24 2253 07/18/24 2311 07/20/24 0309  WBC 8.0  --  6.5  NEUTROABS 3.1  --   --   HGB 14.1 14.3 13.2  HCT 44.3 42.0 40.0  MCV 95.7  --  92.6  PLT 302  --  274   Basic Metabolic Panel: Recent Labs  Lab 07/18/24 2253 07/18/24 2311 07/19/24 2052 07/20/24 0309 07/21/24 0240 07/22/24 0320  NA 135 132* 139 138 133* 137  K 4.7 7.9* 3.6 3.7 3.7 4.9  CL 96*  --  95* 96* 95* 98  CO2 27  --  35* 34* 29 34*  GLUCOSE 166*  --  128* 104* 95 109*  BUN 14  --  16 15 11 13   CREATININE 0.90  --  0.85 0.83 0.63 0.80  CALCIUM  9.4  --  9.2 8.7* 8.3* 8.8*  MG  --   --   --   --  2.4  --   PHOS  --   --   --   --   --  3.0   Liver Function Tests: Recent Labs  Lab 07/18/24 2253 07/22/24 0320  AST 28  --   ALT 12  --   ALKPHOS 67  --   BILITOT 0.6  --   PROT 7.2  --   ALBUMIN 4.2 3.7   CBG: No results for input(s): GLUCAP in the last 168 hours.  Discharge time spent: greater than 30 minutes.  Signed: Elidia Toribio Furnace, MD [facility] 07/22/2024 "

## 2024-07-22 NOTE — Progress Notes (Signed)
 " Daily Progress Note   Date: 07/22/2024   Patient Name: Tammy Walker  DOB: 08/03/1930  MRN: [id number]  Age / Sex: 89 y.o., female  Attending Physician: Tammy Walker Primary Care Physician: Tammy Therisa MATSU, PA Admit Date: 07/18/2024 Length of Stay: 3 days  Reason for Follow-up: {Reason for Consult:23484}  Past Medical History:  Diagnosis Date   Allergy    Arrhythmia    Atrial fibrillation (HCC)    GERD (gastroesophageal reflux disease)    History of bone scan    Bone Density Scans 2017 & 2019 Tammy Walker    History of chest x-ray    Apart from large hiatus hernia, normal heart, lungs and mediastinum. Per records from [facility]     History of CT scan of abdomen 10/24/2017   Gallstones within a thin-waled gallbladder, Per records from [facility]    History of echocardiogram    [facility] &  following   [facility]    History of EKG    Sinus rhythm, borderline 1st deg block, LAD completely changed axis, LBBB(old). Per records from [facility]    Hyperlipidemia    Hypertension    Insomnia    Left lower lobe pneumonia 09/20/2015   Per records from [facility]    Nodule of right lung    Per [facility],    Osteoporosis    on fosamax   Osteoporosis    Reduced mobility     Subjective:   Subjective: Chart Reviewed. Updates received. Patient Assessed. Created space and opportunity for patient  and family to explore thoughts and feelings regarding current medical situation.  Today's Discussion: Today before meeting with the patient/family, I reviewed the chart notes including ***. I also reviewed vital signs, nursing flowsheets, medication administrations record, labs, and imaging. Labs reviewed include ***.  ***  Review of Systems  Objective:   Primary Diagnoses: Present on Admission:  Essential hypertension  Hyperlipidemia  Paroxysmal atrial fibrillation  (HCC)   Vital Signs:  BP (!) 139/92 (BP Location: Left Arm)   Pulse 94   Temp 97.7 F (36.5 C) (Oral)   Resp (!) 22   Ht 4' 11 (1.499 m)   Wt 50.9 kg   SpO2 98%   BMI 22.66 kg/m   Physical Exam  Palliative Assessment/Data: ***   Existing Vynca/ACP Documentation: ***  Advanced Care Planning:   Existing Vynca/ACP Documentation: ***  Primary Decision Maker: {Primary Decision Fjxzm:78612}  Pertinent diagnosis: ***  The patient and/or family consented to a voluntary Advance Care Planning Conversation in person/over the phone***. Individuals present for the conversation: ***  Summary of the conversation: ***  Outcome of the conversations and/or documents completed: ***  I spent *** minutes providing separately identifiable ACP services with the patient and/or surrogate decision maker in a voluntary, in-person conversation discussing the patient's wishes and goals as detailed in the above note.  Assessment & Plan:   HPI/Patient Profile:  ***  SUMMARY OF RECOMMENDATIONS   ***  Symptom Management:  ***  Code Status: {Updated Palliative Code Status:33307}  Prognosis: {Palliative Care Prognosis:23504}  Discharge Planning: {Palliative dispostion:23505}  Discussed with: ***  Thank you for allowing us  to participate in the care of Tammy Walker PMT will continue to support holistically.  Time Total: ***  Detailed review of medical records (labs, imaging, vital signs), medically appropriate exam, discussed with treatment team, counseling and education to patient, family, & staff, documenting clinical information, medication management,  coordination of care  Tammy Kays, NP Palliative Medicine Team  Team Phone # [phone] (Nights/Weekends)  02/26/2021, 8:17 AM  "

## 2024-07-22 NOTE — TOC CM/SW Note (Signed)
 Transition of Care [facility]) - Inpatient Brief Assessment   Patient Details  Name: Tammy Walker MRN: [id number] Date of Birth: 12-28-1930  Transition of Care [facility]) CM/SW Contact:    Waddell Barnie Rama, RN Phone Number: 07/22/2024, 10:18 AM   Clinical Narrative From home with daughter (POA), has PCP and insurance on file, states has no HH services in place at this time,has rollator, shower chair and transport chair at home.  States family member  (daughter) will transport them home at [employer] and family is support system, states gets medications from [facility] [address], but is ok with [facility] pharmacy filling meds at dc.  Pta self ambulatory with rollator.   Per PT eval rec HHPT,  HHOT, HHRN,  NCM offered choice to daughter, she states they would like who they had the last time which was [facility].  NCM sent referral to [facility].      Transition of Care Asessment: Insurance and Status: Insurance coverage has been reviewed Patient has primary care physician: Yes Home environment has been reviewed: home with daughter Prior level of function:: ambulatory with rollator Prior/Current Home Services: Current home services (rollator, transport chair and shower chair) Social Drivers of Health Review: SDOH reviewed no interventions necessary Readmission risk has been reviewed: Yes Transition of care needs: transition of care needs identified, TOC will continue to follow

## 2024-07-22 NOTE — TOC Transition Note (Addendum)
 Transition of Care [facility]) - Discharge Note   Patient Details  Name: Tammy Walker MRN: [id number] Date of Birth: [birth year]  [facility] CM/SW Contact:  Waddell Barnie Rama, RN Phone Number: 07/22/2024, 10:21 AM   Clinical Narrative:    For dc today, daughter her at bedside to transport home at dc. She is set up with [facility], also she is set up with outpatient pall services with [facility] per CSW note.  NCM notified [facility] of dc today,  she has follow up apt on [facility].         Patient Goals and CMS Choice            Discharge Placement                       Discharge Plan and Services Additional resources added to the After Visit Summary for                                       Social Drivers of Health (SDOH) Interventions SDOH Screenings   Food Insecurity: No Food Insecurity (07/19/2024)  Housing: Low Risk (07/19/2024)  Transportation Needs: No Transportation Needs (07/19/2024)  Utilities: Not At Risk (07/19/2024)  Depression (PHQ2-9): Low Risk (04/14/2024)  Social Connections: Moderately Integrated (07/19/2024)  Tobacco Use: Low Risk (07/19/2024)     Readmission Risk Interventions    07/12/2024    4:40 PM  Readmission Risk Prevention Plan  Post Dischage Appt Complete  Medication Screening Complete  Transportation Screening Complete

## 2024-07-24 ENCOUNTER — Encounter: Payer: Self-pay | Admitting: Physical Medicine & Rehabilitation

## 2024-07-25 ENCOUNTER — Telehealth: Payer: Self-pay | Admitting: Home Health

## 2024-07-25 NOTE — Telephone Encounter (Signed)
 Patient's daughter Dagoberto called after hour line, reporting that patient had mild chest discomfort today felt due to tight bra, some SOB when laying flat. She was released 07/22/24, was told to follow up with palliative care. She gave her additional lasix  10mg  today, for a total 20mg . She states her weight is stable. She is just calling because the discharge paper said so. Informed the daughter to watch patient's weight, symptoms for progression. Additional lasix  10mg  is OK. If CHF signs worsened, call back. She already has appt on 1/30.

## 2024-07-26 ENCOUNTER — Encounter: Admitting: Physical Medicine & Rehabilitation

## 2024-07-27 ENCOUNTER — Encounter: Payer: Self-pay | Admitting: Physical Medicine & Rehabilitation

## 2024-07-29 ENCOUNTER — Encounter: Payer: Self-pay | Admitting: Nurse Practitioner

## 2024-07-29 ENCOUNTER — Ambulatory Visit: Attending: Nurse Practitioner | Admitting: Nurse Practitioner

## 2024-07-29 VITALS — BP 144/84 | HR 63 | Ht 60.0 in | Wt 112.0 lb

## 2024-07-29 DIAGNOSIS — Z79899 Other long term (current) drug therapy: Secondary | ICD-10-CM | POA: Diagnosis present

## 2024-07-29 DIAGNOSIS — I480 Paroxysmal atrial fibrillation: Secondary | ICD-10-CM | POA: Diagnosis present

## 2024-07-29 DIAGNOSIS — E782 Mixed hyperlipidemia: Secondary | ICD-10-CM | POA: Insufficient documentation

## 2024-07-29 DIAGNOSIS — I5020 Unspecified systolic (congestive) heart failure: Secondary | ICD-10-CM | POA: Insufficient documentation

## 2024-07-29 DIAGNOSIS — I10 Essential (primary) hypertension: Secondary | ICD-10-CM | POA: Diagnosis present

## 2024-07-29 LAB — BASIC METABOLIC PANEL WITH GFR
BUN/Creatinine Ratio: 20 (ref 12–28)
BUN: 17 mg/dL (ref 10–36)
CO2: 24 mmol/L (ref 20–29)
Calcium: 9.6 mg/dL (ref 8.7–10.3)
Chloride: 98 mmol/L (ref 96–106)
Creatinine, Ser: 0.87 mg/dL (ref 0.57–1.00)
Glucose: 90 mg/dL (ref 70–99)
Potassium: 4.8 mmol/L (ref 3.5–5.2)
Sodium: 138 mmol/L (ref 134–144)
eGFR: 63 mL/min/{1.73_m2}

## 2024-07-29 NOTE — Progress Notes (Unsigned)
 "  Office Visit    Patient Name: Tammy Walker Date of Encounter: 07/29/2024  Primary Care Provider:  Alben Therisa MATSU, PA Primary Cardiologist:  Oneil Parchment, MD  Chief Complaint    89 year old female with a history of persistent atrial fibrillation, chronic HFpEF, hypertension, hyperlipidemia, hiatal hernia, severe protein calorie malnutrition, osteoporosis, and GERD who presents for hospital follow-up related to heart failure.  Past Medical History    Past Medical History:  Diagnosis Date   Allergy    Arrhythmia    Atrial fibrillation (HCC)    GERD (gastroesophageal reflux disease)    History of bone scan    Bone Density Scans 2017 & 2019 Dr. Malcolm    History of chest x-ray    Apart from large hiatus hernia, normal heart, lungs and mediastinum. Per records from [facility]     History of CT scan of abdomen 10/24/2017   Gallstones within a thin-waled gallbladder, Per records from [facility]    History of echocardiogram    [facility] &  [facility]    History of EKG    Sinus rhythm, borderline 1st deg block, LAD completely changed axis, LBBB(old). Per records from [facility]    Hyperlipidemia    Hypertension    Insomnia    Left lower lobe pneumonia 09/20/2015   Per records from [facility]    Nodule of right lung    Per records from [facility],    Osteoporosis    on fosamax   Osteoporosis    Reduced mobility    Past Surgical History:  Procedure Laterality Date   APPENDECTOMY  [address] [place]    CARDIOVERSION N/A 10/29/2022   Procedure: CARDIOVERSION;  Surgeon: Francyne Headland, MD;  Location: [facility] INVASIVE CV LAB;  Service: Cardiovascular;  Laterality: N/A;   CARDIOVERSION N/A 12/01/2022   Procedure: CARDIOVERSION;  Surgeon: Santo Stanly LABOR, MD;  Location: [facility] INVASIVE CV LAB;  Service: Cardiovascular;  Laterality: N/A;   CARDIOVERSION N/A 07/12/2024    Procedure: CARDIOVERSION;  Surgeon: Raford Riggs, MD;  Location: [facility] INVASIVE CV LAB;  Service: Cardiovascular;  Laterality: N/A;   CT SCAN     2017, 2018, 2019 -- re lungs see records    TRANSESOPHAGEAL ECHOCARDIOGRAM (CATH LAB) N/A 07/12/2024   Procedure: TRANSESOPHAGEAL ECHOCARDIOGRAM;  Surgeon: Raford Riggs, MD;  Location: [facility] INVASIVE CV LAB;  Service: Cardiovascular;  Laterality: N/A;   TUBAL LIGATION  1978   [facility], [place], [place]     Allergies  Allergies[1]   Labs/Other Studies Reviewed    The following studies were reviewed today:  Cardiac Studies & Procedures   ______________________________________________________________________________________________     ECHOCARDIOGRAM  ECHOCARDIOGRAM LIMITED 07/19/2024  Narrative ECHOCARDIOGRAM LIMITED REPORT    Patient Name:   Tammy Walker Date of Exam: 07/19/2024 Medical Rec #:  [id number]      Height:       59.0 in Accession #:    [id number]     Weight:       116.5 lb Date of Birth:  02/22/1931      BSA:          1.466 m Patient Age:    89 years       BP:           120/70 mmHg Patient Gender: F              HR:  85 bpm. Exam Location:  Inpatient  Procedure: Limited Echo, Cardiac Doppler and Color Doppler (Both Spectral and Color Flow Doppler were utilized during procedure).  Indications:    Chest Pain  History:        Patient has prior history of Echocardiogram examinations, most recent 07/11/2024. CHF, Arrythmias:Atrial Fibrillation, LBBB and Atrial Flutter, Signs/Symptoms:Chest Pain; Risk Factors:Hypertension and Dyslipidemia.  Sonographer:    Juliene Rucks Referring Phys: MARSHA ADA  IMPRESSIONS   1. Left ventricular ejection fraction, by estimation, is 40 to 45%. The left ventricle has mildly decreased function. The left ventricle demonstrates global hypokinesis. Left ventricular diastolic function could not be evaluated. 2. Right ventricular systolic function is normal. The right  ventricular size is normal. Tricuspid regurgitation signal is inadequate for assessing PA pressure. 3. Left atrial size was severely dilated. 4. The mitral valve is degenerative. Mild to moderate mitral valve regurgitation. No evidence of mitral stenosis. 5. The aortic valve is tricuspid. Aortic valve regurgitation is mild. Aortic valve sclerosis/calcification is present, without any evidence of aortic stenosis.  Comparison(s): No significant change from prior study. Prior images reviewed side by side.  FINDINGS Left Ventricle: Left ventricular ejection fraction, by estimation, is 40 to 45%. The left ventricle has mildly decreased function. The left ventricle demonstrates global hypokinesis. Abnormal (paradoxical) septal motion, consistent with left bundle branch block. Left ventricular diastolic function could not be evaluated. Left ventricular diastolic function could not be evaluated due to atrial fibrillation.  Right Ventricle: The right ventricular size is normal. No increase in right ventricular wall thickness. Right ventricular systolic function is normal. Tricuspid regurgitation signal is inadequate for assessing PA pressure.  Left Atrium: Left atrial size was severely dilated.  Right Atrium: Right atrial size was normal in size.  Pericardium: There is no evidence of pericardial effusion.  Mitral Valve: The mitral valve is degenerative in appearance. Mild to moderate mitral annular calcification. Mild to moderate mitral valve regurgitation, with centrally-directed jet. No evidence of mitral valve stenosis.  Aortic Valve: The aortic valve is tricuspid. Aortic valve regurgitation is mild. Aortic valve sclerosis/calcification is present, without any evidence of aortic stenosis.  Pulmonic Valve: The pulmonic valve was grossly normal. Pulmonic valve regurgitation is not visualized. No evidence of pulmonic stenosis.  Aorta: The aortic root is normal in size and structure.  LEFT  VENTRICLE PLAX 2D LVIDd:         3.90 cm LVIDs:         3.00 cm LV PW:         1.10 cm LV IVS:        1.10 cm LVOT diam:     2.10 cm LVOT Area:     3.46 cm   LEFT ATRIUM            Index LA diam:      2.80 cm  1.91 cm/m LA Vol (A4C): 114.0 ml 77.77 ml/m  AORTA Ao Root diam: 3.30 cm  MITRAL VALVE MV Area (PHT): 4.39 cm    SHUNTS MV Decel Time: 173 msec    Systemic Diam: 2.10 cm MV E velocity: 96.40 cm/s  Jerel Croitoru MD Electronically signed by Jerel Balding MD Signature Date/Time: 07/19/2024/6:30:26 PM    Final   TEE  ECHO TEE 07/12/2024  Narrative TRANSESOPHOGEAL ECHO REPORT    Patient Name:   Tammy Walker Date of Exam: 07/12/2024 Medical Rec #:  [id number]      Height:       59.0 in Accession #:  [id number]     Weight:       115.0 lb Date of Birth:  [birth year]      BSA:          1.458 m Patient Age:    89 years       BP:           134/95 mmHg Patient Gender: F              HR:           96 bpm. Exam Location:  Inpatient  Procedure: Transesophageal Echo, Color Doppler, Cardiac Doppler and Intracardiac Opacification Agent (Both Spectral and Color Flow Doppler were utilized during procedure).  Indications:     Atrial Fibrillation  History:         Patient has prior history of Echocardiogram examinations, most recent 07/11/2024. Arrythmias:Atrial Fibrillation; Risk Factors:Hypertension and Dyslipidemia.  Sonographer:     Koleen Popper RDCS Referring Phys:  [id number] [facility] Diagnosing Phys: Annabella Scarce MD   Sonographer Comments: Technically difficult study due to poor echo windows. Image acquisition challenging due to patient body habitus.   PROCEDURE: After discussion of the risks and benefits of a TEE, an informed consent was obtained from the patient. The transesophogeal probe was passed without difficulty through the esophogus of the patient. Imaged were obtained with the patient in a left lateral decubitus position. Sedation  performed by different physician. The patient was monitored while under deep sedation. Anesthestetic sedation was provided intravenously by Anesthesiology: 238mg  of Propofol , 100mg  of Lidocaine . Image quality was good. The patient's vital signs; including heart rate, blood pressure, and oxygen saturation; remained stable throughout the procedure. Supplementary images were obtained from transthoracic windows as indicated to answer the clinical question. The patient developed no complications during the procedure. Transgastric views not obtained: inability to pass probe.  IMPRESSIONS   1. Left ventricular ejection fraction, by estimation, is 30 to 35%. The left ventricle has moderately decreased function. The left ventricle demonstrates global hypokinesis. 2. Right ventricular systolic function is normal. The right ventricular size is normal. 3. There is a small mobile density in the left atrial appendage with motion independent of the atrium. Consistent with thrombus. Measures 0.7 x 0.3 cm. Left atrial size was severely dilated. A left atrial/left atrial appendage thrombus was detected. The LAA emptying velocity was 21 cm/s. 4. Right atrial size was severely dilated. 5. The mitral valve is normal in structure. Moderate mitral valve regurgitation. No evidence of mitral stenosis. 6. The aortic valve is tricuspid. Aortic valve regurgitation is mild. No aortic stenosis is present. 7. The inferior vena cava is normal in size with greater than 50% respiratory variability, suggesting right atrial pressure of 3 mmHg.  Conclusion(s)/Recommendation(s): Findings concerning for LA/LAA thrombus. Cardioversion was not performed. Would recommend 4 weeks of anticoagulation prior to further attempts at cardioversion.  FINDINGS Left Ventricle: Left ventricular ejection fraction, by estimation, is 30 to 35%. The left ventricle has moderately decreased function. The left ventricle demonstrates global hypokinesis.  Definity  contrast agent was given IV to delineate the left ventricular endocardial borders. The left ventricular internal cavity size was normal in size. There is no left ventricular hypertrophy.  Right Ventricle: The right ventricular size is normal. No increase in right ventricular wall thickness. Right ventricular systolic function is normal.  Left Atrium: There is a small mobile density in the left atrial appendage with motion independent of the atrium. Consistent with thrombus. Measures 0.7 x 0.3 cm. Left atrial size was severely  dilated. A left atrial/left atrial appendage thrombus was detected. The LAA emptying velocity was 21 cm/s.  Right Atrium: Right atrial size was severely dilated.  Pericardium: There is no evidence of pericardial effusion.  Mitral Valve: The mitral valve is normal in structure. Moderate mitral valve regurgitation. No evidence of mitral valve stenosis.  Tricuspid Valve: The tricuspid valve is normal in structure. Tricuspid valve regurgitation is not demonstrated. No evidence of tricuspid stenosis.  Aortic Valve: The aortic valve is tricuspid. Aortic valve regurgitation is mild. No aortic stenosis is present.  Pulmonic Valve: The pulmonic valve was normal in structure. Pulmonic valve regurgitation is not visualized. No evidence of pulmonic stenosis.  Aorta: The aortic root is normal in size and structure.  Venous: The inferior vena cava is normal in size with greater than 50% respiratory variability, suggesting right atrial pressure of 3 mmHg.  IAS/Shunts: No atrial level shunt detected by color flow Doppler.  Additional Comments: Technically challenging images due to large hiatal hernia. Unable to obtain transgastric images.   AORTA Ao Root diam: 3.10 cm Ao Asc diam:  3.50 cm  MITRAL VALVE                  TRICUSPID VALVE MV VTI:      1.46 m           TR Peak grad:   33.2 mmHg MR Peak grad:    84.6 mmHg    TR Vmax:        288.00 cm/s MR Vmax:          460.00 cm/s MR Vmean:        336.0 cm/s MR PISA Nyquist: 0.3 m/s MR PISA:         3.08 cm MR PISA Radius:  0.70 cm  Annabella Scarce MD Electronically signed by Annabella Scarce MD Signature Date/Time: 07/12/2024/4:32:31 PM    Final        ______________________________________________________________________________________________     Recent Labs: 07/11/2024: TSH 4.570 07/18/2024: ALT 12; Pro Brain Natriuretic Peptide 1,697.0 07/20/2024: Hemoglobin 13.2; Platelets 274 07/21/2024: Magnesium 2.4 07/22/2024: BUN 13; Creatinine, Ser 0.80; Potassium 4.9; Sodium 137  Recent Lipid Panel    Component Value Date/Time   CHOL 213 (H) 08/15/2019 0827   TRIG 128 08/15/2019 0827   HDL 79 08/15/2019 0827   CHOLHDL 2.7 08/15/2019 0827   LDLCALC 110 (H) 08/15/2019 0827    History of Present Illness    89 year old female with the above past medical history including persistent atrial fibrillation, chronic HFrEF, hypertension, hyperlipidemia, hiatal hernia, severe protein calorie malnutrition, osteoporosis, and GERD.  She has a history of atrial fibrillation, previously failed Multaq , s/p DCCV in 11/17/2022 and 12/18/2022.  Now on amiodarone , Edoxaban  for chronic anticoagulation.  She has f followed with the A-fib clinic, last seen 01/26/2024.  Previously declined ablation, PPM.  She was hospitalized from 07/11/2074 to 07/13/2024 in the setting of acute on chronic diastolic heart failure, atrial fibrillation with RVR.  Cardiology was consulted.  Echocardiogram showed EF 35 to 40%, moderately decreased LV function, LV global hypokinesis, moderate asymmetric LVH of the basal septal segment, normal RV systolic function, severe BAE, mild to moderate tricuspid valve regurgitation, mild aortic valve regurgitation, aortic valve sclerosis without evidence of aortic stenosis.  TEE revealed EF 35%, global hypokinesis, large hiatal hernia, small mobile thrombus in the LAA, DCCV was deferred in the setting.  She  converted to normal sinus rhythm on IV amiodarone , however, early recurrence of A-fib.  She was diuresed  with IV Lasix .  She was hospitalized again from 07/18/2024 to 07/22/2024 in the setting of acute on chronic HFrEF, chest pain.  Troponin was mildly elevated, this was noted to likely be in the setting of acute fluid volume overload, demand ischemia.  Conservative management was recommended.  Repeat limited echocardiogram on 07/19/2024 showed EF 40 to 45%, mildly decreased LV function, LV global hypokinesis, normal RV systolic function, mild to moderate mitral valve regurgitation, mild aortic valve regurgitation, no significant change from prior study.  She was diuresed appropriately, she was referred to palliative care as an outpatient.  Her daughter contacted our office on 07/25/2024 with concern for mild chest discomfort, orthopnea.  She was advised to take an additional Lasix  10 mg as needed for symptoms.  She presents today for follow-up accompanied by her daughter.  Since her hospitalization she has been stable overall from a cardiac standpoint.  She has been taking an additional 10 mg of Lasix  daily, and with this change, her fluid volume status has been generally well-controlled.  She does note occasional fullness in her chest and abdomen, occasional chest discomfort which she describes as a bone pain.  She denies any palpitations, dizziness, PND, orthopnea, weight gain.  She shares that she was previously scheduled to undergo a dental procedure (surgical extraction of 2 teeth).  Her seizure was delayed due to her hospitalization.  She is asking if she is at acceptable risk to proceed at this time.  Home Medications    Current Outpatient Medications  Medication Sig Dispense Refill   acetaminophen  (TYLENOL ) 500 MG tablet 2 tablets in the AM, 1 tablet in the afternoon, and 1 tablet at night Orally three times a day     amiodarone  (PACERONE ) 200 MG tablet Take 0.5 tablets (100 mg total) by mouth at  bedtime. 15 tablet 0   atorvastatin  (LIPITOR) 20 MG tablet TAKE 1 TABLET(20 MG) BY MOUTH AT BEDTIME 90 tablet 3   bisacodyl  (DULCOLAX) 5 MG EC tablet Take 10 mg by mouth at bedtime.     Calcium  Carb-Cholecalciferol  (CALCIUM  + VITAMIN D3 PO) Take 1 tablet by mouth 2 (two) times daily with a meal.     Calcium  Carb-Cholecalciferol  (CHEWABLE CALCIUM /D3 PO) Take 2 each by mouth 2 (two) times daily with a meal. Vitafusion; calcium  + D3 gummies     carboxymethylcellulose (REFRESH PLUS) 0.5 % SOLN Place 1 drop into both eyes every 4 (four) hours as needed (eye irritatation).     cetirizine (ZYRTEC) 10 MG tablet Take 10 mg by mouth daily.     denosumab  (PROLIA ) 60 MG/ML SOSY injection Inject 60 mg into the skin every 6 (six) months.     diclofenac  Sodium (VOLTAREN ) 1 % GEL Apply 1 Application topically 4 (four) times daily as needed (pain).     edoxaban  (SAVAYSA ) 30 MG TABS tablet Take 1 tablet (30 mg total) by mouth daily. 90 tablet 3   furosemide  (LASIX ) 20 MG tablet Take 0.5 tablets (10 mg total) by mouth daily. 30 tablet 0   hydrocortisone  (ANUSOL -HC) 2.5 % rectal cream Apply 1 Application topically 2 (two) times daily as needed for hemorrhoids (rectal pain).     Hydrocortisone  (PREPARATION H EX) Apply 1 Application topically every 6 (six) hours as needed (rectal pain, hemorroids).     ipratropium (ATROVENT ) 0.03 % nasal spray Place 2 sprays into both nostrils every 12 (twelve) hours. 30 mL 12   LINZESS 72 MCG capsule Take 72 mcg by mouth daily as needed.  Multiple Vitamins-Minerals (PRESERVISION AREDS 2 PO) Take 1 capsule by mouth 2 (two) times daily with a meal.     ondansetron  (ZOFRAN ) 4 MG tablet Take 1 tablet (4 mg total) by mouth every 8 (eight) hours as needed for nausea or vomiting. 20 tablet 0   oxyCODONE  (OXY IR/ROXICODONE ) 5 MG immediate release tablet Take 0.5 tablets (2.5 mg total) by mouth every 12 (twelve) hours as needed for severe pain (pain score 7-10). 30 tablet 0   oxyCODONE   (OXYCONTIN ) 10 mg 12 hr tablet Take 1 tablet (10 mg total) by mouth every 12 (twelve) hours. 60 tablet 0   phenylephrine  (,USE FOR PREPARATION-H,) 0.25 % suppository Place 1 suppository rectally 2 (two) times daily as needed for hemorrhoids (rectal pain).     polyethylene glycol (MIRALAX  / GLYCOLAX ) 17 g packet Take 17 g by mouth 2 (two) times daily.     pregabalin  (LYRICA ) 25 MG capsule Take 1 capsule (25 mg total) by mouth See admin instructions. Taking 75 mg by mouth in the morning, 25 mg afternoon and 25 mg in the evening (Patient taking differently: Take 25 mg by mouth 2 (two) times daily. Take 25 mg by mouth at 2 pm and at 8 pm daily) 180 capsule 2   pregabalin  (LYRICA ) 75 MG capsule Take 1 capsule (75 mg total) by mouth daily. (Patient taking differently: Take 75 mg by mouth daily. Take 75 mg by mouth at 8 am daily) 90 capsule 2   sodium chloride  (OCEAN) 0.65 % nasal spray Place 1-2 sprays into the nose every 4 (four) hours as needed (sinus irritation).     spironolactone  (ALDACTONE ) 25 MG tablet Take 0.5 tablets (12.5 mg total) by mouth daily. 15 tablet 0   triamcinolone cream (KENALOG) 0.1 % Apply 1 Application topically 2 (two) times daily as needed (irritation).     White Petrolatum-Mineral Oil (SYSTANE NIGHTTIME) OINT Place 1 application  into both eyes at bedtime as needed (dry eyes).     No current facility-administered medications for this visit.     Review of Systems    She denies palpitations, pnd, orthopnea, n, v, dizziness, syncope, edema, weight gain, or early satiety. All other systems reviewed and are otherwise negative except as noted above.   Physical Exam    VS:  BP (!) 144/84 (Cuff Size: Normal)   Pulse 63   Ht 5' (1.524 m)   Wt 112 lb (50.8 kg)   SpO2 95%   BMI 21.87 kg/m  GEN: Well nourished, well developed, in no acute distress. HEENT: normal. Neck: Supple, no JVD, carotid bruits, or masses. Cardiac: IRIR, no murmurs, rubs, or gallops. No clubbing, cyanosis,  edema.  Radials/DP/PT 2+ and equal bilaterally.  Respiratory:  Respirations regular and unlabored, clear to auscultation bilaterally. GI: Soft, nontender, nondistended, BS + x 4. MS: no deformity or atrophy. Skin: warm and dry, no rash. Neuro:  Strength and sensation are intact. Psych: Normal affect.  Accessory Clinical Findings    ECG personally reviewed by me today -    - no EKG in office today.   Lab Results  Component Value Date   WBC 6.5 07/20/2024   HGB 13.2 07/20/2024   HCT 40.0 07/20/2024   MCV 92.6 07/20/2024   PLT 274 07/20/2024   Lab Results  Component Value Date   CREATININE 0.80 07/22/2024   BUN 13 07/22/2024   NA 137 07/22/2024   K 4.9 07/22/2024   CL 98 07/22/2024   CO2 34 (H) 07/22/2024  Lab Results  Component Value Date   ALT 12 07/18/2024   AST 28 07/18/2024   ALKPHOS 67 07/18/2024   BILITOT 0.6 07/18/2024   Lab Results  Component Value Date   CHOL 213 (H) 08/15/2019   HDL 79 08/15/2019   LDLCALC 110 (H) 08/15/2019   TRIG 128 08/15/2019   CHOLHDL 2.7 08/15/2019    No results found for: HGBA1C  Assessment & Plan    1. Chronic HFrEF/chest pain: Two recent hospitalizations in the setting of acute on chronic HFrEF, atrial fibrillation with RVR, chest pain. Troponin was mildly elevated, this was noted to likely be in the setting of acute fluid volume overload, demand ischemia.  Conservative management was recommended. Limited echocardiogram on 07/19/2024 showed EF 40 to 45%, mildly decreased LV function, LV global hypokinesis, normal RV systolic function, mild to moderate mitral valve regurgitation, mild aortic valve regurgitation, no significant change from prior study.  She was diuresed appropriately, she was referred to palliative care as an outpatient.  Since her hospital discharge she has been stable overall.  She reports occasional fullness in her chest and abdomen, occasional chest discomfort, stable nonpitting bilateral lower extremity edema.   Fluid volume status has been stable overall, however, she has been taking an additional Lasix  10 mg daily most days.  Through shared decision making, will increase Lasix  to 20 mg daily.  She may take an additional Lasix  10 mg daily as needed for swelling, weight gain, shortness of breath.  Will check BMET today.  Reviewed ED precautions, daily weights, sodium and fluid recommendations.  Plan for close follow-up.  She hopes to avoid any further hospitalizations.  Continue spironolactone .  2. Persistent atrial fibrillation: Rate controlled.  Patient wishes to pursue rate control strategy at this time.  This is reasonable as she is high risk for recurrent atrial fibrillation.  Has palpitations, denies bleeding. Recent chest x-ray, CBC, CMET stable. Continue amiodarone , edoxaban .  3. Hypertension: She has had somewhat labile BP.  Will increase Lasix  as above.  If BP remains elevated, consider increasing spironolactone . Reviewed BP monitoring techniques.  4. Hyperlipidemia: LDL was 91 in 03/2024.  Continue Lipitor.  5. Preoperative cardiac exam: She shares that she was previously scheduled to undergo a dental procedure (surgical extraction of 2 teeth).  Procedure was delayed in the setting of recent hospitalizations.  Timing of procedure is dependent on time of her last Prolia  injection.  Surgical clearance request states this would be done under general anesthesia.  Will reach out to preop coverage team to clarify procedure details. She is unable to complete greater than 4 METS at baseline.  Of note, patient says her teeth are not bothering her at this time.  She is likely optimized from a cardiac standpoint however, will review with Dr. Jeffrie to determine if she is at acceptable risk to proceed.  6. Disposition: Follow-up as scheduled Dr. Jeffrie in 07/2024.     Damien JAYSON Braver, NP 07/29/2024, 4:07 PM       [1] No Known Allergies  "

## 2024-07-29 NOTE — Patient Instructions (Signed)
 Medication Instructions:  Furosemide  (Lasix ) 20 mg daily. May take an additional 20 mg on the days of swelling, weight gain 3 lb overnight or 5 lb in 1 week.  *If you need a refill on your cardiac medications before your next appointment, please call your pharmacy*  Lab Work: BMET today   Testing/Procedures: NONE ordered at this time of appointment   Follow-Up: At [facility], you and your health needs are our priority.  As part of our continuing mission to provide you with exceptional heart care, our providers are all part of one team.  This team includes your primary Cardiologist (physician) and Advanced Practice Providers or APPs (Physician Assistants and Nurse Practitioners) who all work together to provide you with the care you need, when you need it.  Your next appointment:    Keep apt with Dr. Jeffrie  We recommend signing up for the patient portal called MyChart.  Sign up information is provided on this After Visit Summary.  MyChart is used to connect with patients for Virtual Visits (Telemedicine).  Patients are able to view lab/test results, encounter notes, upcoming appointments, etc.  Non-urgent messages can be sent to your provider as well.   To learn more about what you can do with MyChart, go to [email or web address].   Other Instructions

## 2024-07-30 ENCOUNTER — Encounter: Payer: Self-pay | Admitting: Nurse Practitioner

## 2024-08-01 ENCOUNTER — Ambulatory Visit: Payer: Self-pay | Admitting: Nurse Practitioner

## 2024-08-01 MED ORDER — OXYCODONE HCL ER 10 MG PO T12A: 10.0000 mg | EXTENDED_RELEASE_TABLET | Freq: Two times a day (BID) | ORAL | 0 refills | Status: AC

## 2024-08-01 MED ORDER — OXYCODONE HCL ER 10 MG PO T12A: 10.0000 mg | EXTENDED_RELEASE_TABLET | Freq: Two times a day (BID) | ORAL | 0 refills | Status: DC

## 2024-08-04 ENCOUNTER — Telehealth: Payer: Self-pay

## 2024-08-04 NOTE — Telephone Encounter (Signed)
 PA FOR OXYCONTIN  10 MG CREATED IN COVER MY MEDS

## 2024-08-08 ENCOUNTER — Encounter: Admitting: Physical Medicine & Rehabilitation

## 2024-08-23 ENCOUNTER — Ambulatory Visit (HOSPITAL_BASED_OUTPATIENT_CLINIC_OR_DEPARTMENT_OTHER): Admitting: Cardiology
